# Patient Record
Sex: Female | Born: 1935 | Race: White | Hispanic: No | Smoking: Never smoker
Health system: Southern US, Community
[De-identification: ages and names within clinical notes are randomized; demographics above are authoritative.]

## PROBLEM LIST (undated history)

## (undated) DIAGNOSIS — C801 Malignant (primary) neoplasm, unspecified: Secondary | ICD-10-CM

## (undated) DIAGNOSIS — N2889 Other specified disorders of kidney and ureter: Secondary | ICD-10-CM

## (undated) DIAGNOSIS — C49A0 Gastrointestinal stromal tumor, unspecified site: Secondary | ICD-10-CM

## (undated) DIAGNOSIS — G309 Alzheimer's disease, unspecified: Secondary | ICD-10-CM

## (undated) DIAGNOSIS — Z960 Presence of urogenital implants: Secondary | ICD-10-CM

## (undated) DIAGNOSIS — C50919 Malignant neoplasm of unspecified site of unspecified female breast: Secondary | ICD-10-CM

## (undated) DIAGNOSIS — F0280 Dementia in other diseases classified elsewhere without behavioral disturbance: Secondary | ICD-10-CM

## (undated) DIAGNOSIS — Z993 Dependence on wheelchair: Secondary | ICD-10-CM

## (undated) DIAGNOSIS — I10 Essential (primary) hypertension: Secondary | ICD-10-CM

## (undated) DIAGNOSIS — E7800 Pure hypercholesterolemia, unspecified: Secondary | ICD-10-CM

## (undated) DIAGNOSIS — C787 Secondary malignant neoplasm of liver and intrahepatic bile duct: Secondary | ICD-10-CM

## (undated) HISTORY — DX: Essential (primary) hypertension: I10

## (undated) HISTORY — PX: MASTECTOMY: SHX3

---

## 2000-03-03 ENCOUNTER — Emergency Department (HOSPITAL_COMMUNITY): Admission: EM | Admit: 2000-03-03 | Discharge: 2000-03-03 | Payer: Self-pay | Admitting: Emergency Medicine

## 2000-03-04 ENCOUNTER — Inpatient Hospital Stay (HOSPITAL_COMMUNITY): Admission: EM | Admit: 2000-03-04 | Discharge: 2000-03-20 | Payer: Self-pay | Admitting: Emergency Medicine

## 2000-03-05 ENCOUNTER — Encounter: Payer: Self-pay | Admitting: Internal Medicine

## 2000-03-08 ENCOUNTER — Encounter: Payer: Self-pay | Admitting: Internal Medicine

## 2000-03-09 ENCOUNTER — Encounter: Payer: Self-pay | Admitting: Internal Medicine

## 2000-03-13 ENCOUNTER — Encounter: Payer: Self-pay | Admitting: Internal Medicine

## 2000-04-13 ENCOUNTER — Encounter: Admission: RE | Admit: 2000-04-13 | Discharge: 2000-04-13 | Payer: Self-pay | Admitting: Internal Medicine

## 2000-04-14 ENCOUNTER — Ambulatory Visit (HOSPITAL_COMMUNITY): Admission: RE | Admit: 2000-04-14 | Discharge: 2000-04-14 | Payer: Self-pay | Admitting: Internal Medicine

## 2000-05-11 ENCOUNTER — Encounter: Admission: RE | Admit: 2000-05-11 | Discharge: 2000-05-11 | Payer: Self-pay | Admitting: Internal Medicine

## 2000-06-30 ENCOUNTER — Encounter: Admission: RE | Admit: 2000-06-30 | Discharge: 2000-06-30 | Payer: Self-pay | Admitting: Internal Medicine

## 2000-06-30 ENCOUNTER — Encounter: Payer: Self-pay | Admitting: Internal Medicine

## 2000-07-14 ENCOUNTER — Ambulatory Visit (HOSPITAL_COMMUNITY): Admission: RE | Admit: 2000-07-14 | Discharge: 2000-07-14 | Payer: Self-pay

## 2000-07-14 ENCOUNTER — Encounter (INDEPENDENT_AMBULATORY_CARE_PROVIDER_SITE_OTHER): Payer: Self-pay | Admitting: *Deleted

## 2000-07-28 ENCOUNTER — Encounter: Admission: RE | Admit: 2000-07-28 | Discharge: 2000-07-28 | Payer: Self-pay | Admitting: Internal Medicine

## 2000-08-25 ENCOUNTER — Encounter (INDEPENDENT_AMBULATORY_CARE_PROVIDER_SITE_OTHER): Payer: Self-pay | Admitting: Specialist

## 2000-08-25 ENCOUNTER — Observation Stay (HOSPITAL_COMMUNITY): Admission: RE | Admit: 2000-08-25 | Discharge: 2000-08-26 | Payer: Self-pay

## 2000-09-29 ENCOUNTER — Encounter: Admission: RE | Admit: 2000-09-29 | Discharge: 2000-09-29 | Payer: Self-pay | Admitting: Internal Medicine

## 2000-11-09 ENCOUNTER — Encounter: Admission: RE | Admit: 2000-11-09 | Discharge: 2000-12-31 | Payer: Self-pay | Admitting: Internal Medicine

## 2000-12-29 ENCOUNTER — Encounter: Admission: RE | Admit: 2000-12-29 | Discharge: 2000-12-29 | Payer: Self-pay | Admitting: Internal Medicine

## 2002-02-03 ENCOUNTER — Encounter: Payer: Self-pay | Admitting: General Surgery

## 2002-02-03 ENCOUNTER — Encounter: Admission: RE | Admit: 2002-02-03 | Discharge: 2002-02-03 | Payer: Self-pay | Admitting: General Surgery

## 2003-03-07 ENCOUNTER — Encounter: Admission: RE | Admit: 2003-03-07 | Discharge: 2003-06-05 | Payer: Self-pay | Admitting: Internal Medicine

## 2003-06-06 ENCOUNTER — Encounter: Admission: RE | Admit: 2003-06-06 | Discharge: 2003-07-13 | Payer: Self-pay | Admitting: Internal Medicine

## 2004-08-07 ENCOUNTER — Ambulatory Visit (HOSPITAL_COMMUNITY): Admission: RE | Admit: 2004-08-07 | Discharge: 2004-08-07 | Payer: Self-pay | Admitting: Gastroenterology

## 2007-02-17 ENCOUNTER — Encounter: Admission: RE | Admit: 2007-02-17 | Discharge: 2007-02-17 | Payer: Self-pay | Admitting: Internal Medicine

## 2009-11-08 ENCOUNTER — Encounter: Admission: RE | Admit: 2009-11-08 | Discharge: 2009-11-08 | Payer: Self-pay | Admitting: Emergency Medicine

## 2009-11-16 ENCOUNTER — Encounter (HOSPITAL_BASED_OUTPATIENT_CLINIC_OR_DEPARTMENT_OTHER): Admission: RE | Admit: 2009-11-16 | Discharge: 2010-01-08 | Payer: Self-pay | Admitting: General Surgery

## 2009-11-23 ENCOUNTER — Ambulatory Visit: Admission: RE | Admit: 2009-11-23 | Discharge: 2009-11-23 | Payer: Self-pay | Admitting: General Surgery

## 2009-11-23 ENCOUNTER — Ambulatory Visit: Payer: Self-pay | Admitting: Vascular Surgery

## 2010-02-12 ENCOUNTER — Encounter: Admission: RE | Admit: 2010-02-12 | Discharge: 2010-02-12 | Payer: Self-pay | Admitting: Emergency Medicine

## 2010-04-23 ENCOUNTER — Emergency Department (HOSPITAL_COMMUNITY): Admission: EM | Admit: 2010-04-23 | Discharge: 2010-04-24 | Payer: Self-pay | Admitting: Emergency Medicine

## 2010-08-12 ENCOUNTER — Encounter
Admission: RE | Admit: 2010-08-12 | Discharge: 2010-10-08 | Payer: Self-pay | Source: Home / Self Care | Admitting: Neurology

## 2011-03-21 NOTE — Op Note (Signed)
[facility]  Patient:    Melanie Trevino, Melanie Trevino                         MRN: [id number] Proc. Date: 08/25/00 Adm. Date:  [id number] Attending:  Gennie Alma CC:         Winn Jock. Earl Gala, M.D.   Operative Report  CC#:  [id number]  PREOPERATIVE DIAGNOSES:  Dominant nodule of right lower pole of right lobe of thyroid.  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  OPERATION PERFORMED:  Right thyroid lobectomy.  SURGEON:  Dr. Orpah Greek.  ASSISTANT:  Dr. Rozetta Nunnery.  ANESTHESIA:  General endotracheal.  DESCRIPTION OF PROCEDURE:  Under adequate general endotracheal anesthesia, the patients neck was extended on a shoulder roll and then prepared and draped in the usual fashion. A low collar incision was made. The patient had a very short neck that was significant in circumference. The incision was deepened through the platysma muscle. The superior and inferior platysmal flaps were then developed and the middle cervical fascia was exposed. The Mohorner self retaining retractor was inserted. The neurocervical fascia was then incised longitudinally in the midline. The right sternohyoid and sternothyroid strap muscles were then divided transversely using Bovie electrocoagulation. In doing this, the external jugular vein and anterior jugular vein were both divided over hemostats which were ligated with 3-0 silk.  The surgical plane of the right lobe of the thyroid was entered by blunt and sharp dissection. The lobe of the thyroid was freed up from the surrounding tissue and placed on traction anteriorly and rotated medially. The inferior pole structures were freed up. The inferior parathyroid gland was identified and spared of any injury. The vessels going to the inferior pole of the thyroid were divided over medium hemoclips and clamped with hemostats and ligated with 3-0 silk. This gave a good deal of freedom to the right lobe.  The superior pole vessels were then dissected out and divided over triple application of 2-0 silk leaving a double application on the stay side. This allowed for much more mobility of the lobe which could then be rotated further anteriorly and medially. The right recurrent laryngeal nerve was identified and spared of injury as the thyroid gland was dissected off of it. The branches of the inferior thyroid artery were divided over medium hemoclips and then the attachment to the trachea were divided with Bovie electrocoagulation. The specimen then remained attached only by its attachment to the isthmus of the thyroid. The isthmus was divided over a hemostat at the junction between the isthmus and the left lobe of the thyroid. The specimen was removed from the operative field and sent for frozen section study. The medial edge of the thyroid gland at its junction with the isthmus was repaired with continuous suture of 4-0 Vicryl for hemostasis. The frozen section analysis of the specimen was that it was a benign follicular hyperplasia and did not appear to be a follicular neoplasm.  Palpation of the left lobe of the thyroid appeared that was completely normal and so no further surgery was done. The sternohyoid muscle was repaired with interrupted sutures of 4-0 Vicryl and then reapproximated in the midline after hemostasis was ascertained. There was a little bit of oozing just next to the recurrent laryngeal nerve as it enters the trachea and this was controlled with Surgicel.  The platysma muscle was reapproximated with interrupted sutures of 4-0 Vicryl, the skin was approximated with  generic skin stapler 35-W and a sterile dressing was applied. Estimated blood loss for the procedure was less than 50 cc. The patient tolerated the procedure well and left the operating room in satisfactory condition. DD:  08/25/00 TD:  08/25/00 Job: [id number]

## 2011-03-21 NOTE — Discharge Summary (Signed)
[facility]  Patient:    Melanie Trevino, Melanie Trevino                         MRN: [id number] Adm. Date:  [id number] Disc. Date: [id number] Attending:  Darnelle Bos Dictator:   Winn Jock Earl Gala, M.D.                           Discharge Summary  ADMISSION DIAGNOSIS:  Cellulitis.  DISCHARGE DIAGNOSES: 1.  Cellulitis. 2.  Shortness of breath secondary to atelectasis. 3.  Thyroid nodule. 4.  Indeterminate liver lesion. 5.  History of spindle cell tumor. 6.  History of acoustic neuroma. 7.  Yeast infection. 8.  Hypertension.  Please see admitting history and physical examination for detail.  HOSPITAL COURSE:  The patient was admitted for fever and severe right leg cellulitis.  This involved the right lower leg.  There was a small area of excoriation on the lateral malleolus.  Her main pain, however, was over the medial malleolus.  Because of a recent arm fracture for which she was to follow-up, she was seen by Orthopedics.  She was also seen in consultation by Infectious Disease who felt this was possibly due to methicillin-resistant Staphylococcus aureus and so recommended a change from Ancef to vancomycin and doxycycline.  The patients temperature slowly trended down.  She did not really become afebrile until Mar 14, 2000.  During that time, she also had an episode of shortness of breath.  Chest x-ray revealed atelectasis.  Spiral CT scan was done and showed no abnormality consistent with a pulmonary embolus.  She continued to be treated with minocycline and vancomycin.  She had difficulty trying to ambulate because the leg would become much more edematous and painful when she tried to stand.  Therefore, she started to use Ace wraps to try to control the edema and this was very successful in allowing her to finally get up and ambulate.  She worked steadily with Physical Therapy.  She was seen in follow-up several times by Dr. Amanda Pea concerning her arm  fracture and was finally able to ambulate adequately to return home.  On Mar 16, 2000, vancomycin was actually discontinued and minocycline was continued at that point.  She developed some vulvar erythema and itching and was treated with Diflucan.  She continued to slowly improve.  She was able to ambulate down the hall with assistance and was therefore able to be discharged in an improved condition.  MEDICATIONS ON DISCHARGE: 1.  Cozaar 50 mg b.i.d. 2.  Lasix 20 mg, two daily. 3.  Minocycline 100 mg b.i.d. x 5 days. 4.  Potassium chloride 20 mEq daily. 5.  Glucosamine chondroitin daily. 6.  Pravachol 20 mg at bedtime.  FOLLOW-UP:  She will make an appointment to see me in one to two weeks and Dr. Orvan Falconer in two to three weeks.  She will follow-up with Dr. Amanda Pea as requested by him.  She will use an Ace wrap on the leg prior to ambulation. DIET:  She will eat a diet as tolerated.  ACTIVITY:  As tolerated and per Physical Therapy. DD:  04/29/00 TD:  04/30/00 Job: [id number]

## 2011-03-21 NOTE — H&P (Signed)
[facility]  Patient:    Melanie Trevino, Melanie Trevino                         MRN: [id number] Adm. Date:  [id number] Attending:  Darnelle Bos                         History and Physical  HISTORY OF PRESENT ILLNESS:  Ms. Melanie Trevino is a 77 year old white female with notable cellulitis.  In 1998, she had trouble with erythema and swelling of the right leg.  She saw Dr. Nicoletta Dress. Strand in our office at that time.  She eventually saw Dr. Yetta Barre and ended up with a biopsy.  She supposedly had a "unusual Staph" and was treated with Septra.  She chronically has had some lesions on the right ankle that have never healed entirely.  In early 2001, she had a course of Cipro for some recurring infection and cellulitis.  [place]th[place]  She fractured her left elbow and scraped her left ankle and twisted her right knee.  The knee became red and swollen.  She saw Dr. Onalee Hua III for the elbow fracture and she was given Keflex for some drainage and weeping around the right ankle as well; that improved.  On Monday,  April 30th, she had nausea, vomiting, fever and some loose stools.  We presumed it was a gastroenteritis.  Yesterday, she felt better, but yesterday afternoon, she noted that her right leg was very red and swollen.  She came in and was seen in the emergency room at [facility] and was treated with Rocephin.  She came in today for another dose but she was worse.  A Doppler exam was negative for clot but there were large hyperemic lymph nodes in the right groin.  I was called to admit her.  PAST MEDICAL HISTORY:  Hypertension.  Arthritis.  Colon polyps.  Spindle cell tumor of the gut.  Acoustic neuroma.  Colon polyps.  Incisional herniae.  SURGICAL HISTORY:  Tonsillectomy.  Total hip replacement.  Hysterectomy. Resection of spindle cell tumor.  Resection of acoustic neuroma and stereotactic radiotherapy of acoustic  neuroma.  Colonoscopy.  ALLERGIES:  HORSE SERUM.  MEDICATIONS: 1. Cozaar 50 mg b.i.d. 2. Lasix 20 mg two in the morning and one in the evening. 3. Kay-Ciel 20 mEq daily. 4. Vitamin E 1000 IU daily. 5. Glucosamine and chondroitin. 6. Vitamin C 500 mg daily.  SOCIAL HISTORY:  She does not smoke.  She is happily married.  She drinks occasionally.  FAMILY HISTORY:  Parents have passed away, father with prostate cancer, mother f lung cancer.  Mother had diabetes.  REVIEW OF SYSTEMS:  Otherwise negative.  PHYSICAL EXAMINATION:  GENERAL:  Well-developed, well-nourished, in no distress.  VITAL SIGNS:  Temperature 102.3, blood pressure 120/69, pulse 72.  HEENT:  The eyes have pupils which are round and reactive to light. Extraocular movements are intact.  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  NECK:  Supple.  Thyroid is not enlarged or tender.  CHEST:  Clear to auscultation and percussion.  CARDIAC:  Normal S1 and S2, without an S3, S4, murmur, rub or click.  ABDOMEN:  Soft and nontender with incisional herniae anteriorly.  EXTREMITIES:  Extremities reveal the left leg to be okay.  The left arm is in a  splint.  The right  leg has redness and swelling from the ankle to just below the knee circumferentially around the entire leg.  There is right groin erythema just medial and inferior to the inguinal fold.  There is a palpable node in the femoral triangle as well as in the inguinal area.  NEUROLOGIC:  Except for the cranial nerves described above, other cranial nerves are intact.  Motor is 5/5.  LABORATORY DATA:  WBC 17,000, hemoglobin 14.3, potassium 3.3, sodium 137.  IMPRESSION: 1. Severe right leg cellulitis. 2. Nausea and vomiting secondary to #1. 3. Hypokalemia. 4. Arm fracture. 5. Acoustic neuroma. 6. Spindle cell tumor.  PLAN:  Patient will be admitted, treated with IV Ancef.  I will call orthopedics in the  morning to check her arm.  I will give her IV fluids and replete her potassium. DD:  03/04/00 TD:  03/05/00 Job: [id number]

## 2011-11-03 DIAGNOSIS — C7889 Secondary malignant neoplasm of other digestive organs: Secondary | ICD-10-CM | POA: Diagnosis not present

## 2011-11-03 DIAGNOSIS — Z466 Encounter for fitting and adjustment of urinary device: Secondary | ICD-10-CM | POA: Diagnosis not present

## 2011-11-03 DIAGNOSIS — R32 Unspecified urinary incontinence: Secondary | ICD-10-CM | POA: Diagnosis not present

## 2011-11-03 DIAGNOSIS — M169 Osteoarthritis of hip, unspecified: Secondary | ICD-10-CM | POA: Diagnosis not present

## 2011-11-03 DIAGNOSIS — D333 Benign neoplasm of cranial nerves: Secondary | ICD-10-CM | POA: Diagnosis not present

## 2011-11-11 DIAGNOSIS — D333 Benign neoplasm of cranial nerves: Secondary | ICD-10-CM | POA: Diagnosis not present

## 2011-11-11 DIAGNOSIS — R32 Unspecified urinary incontinence: Secondary | ICD-10-CM | POA: Diagnosis not present

## 2011-11-11 DIAGNOSIS — C7889 Secondary malignant neoplasm of other digestive organs: Secondary | ICD-10-CM | POA: Diagnosis not present

## 2011-11-11 DIAGNOSIS — Z466 Encounter for fitting and adjustment of urinary device: Secondary | ICD-10-CM | POA: Diagnosis not present

## 2011-11-11 DIAGNOSIS — M169 Osteoarthritis of hip, unspecified: Secondary | ICD-10-CM | POA: Diagnosis not present

## 2011-11-12 DIAGNOSIS — Z792 Long term (current) use of antibiotics: Secondary | ICD-10-CM | POA: Diagnosis not present

## 2011-11-12 DIAGNOSIS — M1710 Unilateral primary osteoarthritis, unspecified knee: Secondary | ICD-10-CM | POA: Diagnosis not present

## 2011-11-12 DIAGNOSIS — C494 Malignant neoplasm of connective and soft tissue of abdomen: Secondary | ICD-10-CM | POA: Diagnosis not present

## 2011-11-12 DIAGNOSIS — IMO0002 Reserved for concepts with insufficient information to code with codable children: Secondary | ICD-10-CM | POA: Diagnosis not present

## 2011-11-12 DIAGNOSIS — M169 Osteoarthritis of hip, unspecified: Secondary | ICD-10-CM | POA: Diagnosis not present

## 2011-11-19 DIAGNOSIS — R32 Unspecified urinary incontinence: Secondary | ICD-10-CM | POA: Diagnosis not present

## 2011-11-19 DIAGNOSIS — Z466 Encounter for fitting and adjustment of urinary device: Secondary | ICD-10-CM | POA: Diagnosis not present

## 2011-11-19 DIAGNOSIS — D333 Benign neoplasm of cranial nerves: Secondary | ICD-10-CM | POA: Diagnosis not present

## 2011-11-19 DIAGNOSIS — M169 Osteoarthritis of hip, unspecified: Secondary | ICD-10-CM | POA: Diagnosis not present

## 2011-11-19 DIAGNOSIS — C7889 Secondary malignant neoplasm of other digestive organs: Secondary | ICD-10-CM | POA: Diagnosis not present

## 2011-11-20 DIAGNOSIS — R32 Unspecified urinary incontinence: Secondary | ICD-10-CM | POA: Diagnosis not present

## 2011-11-20 DIAGNOSIS — Z466 Encounter for fitting and adjustment of urinary device: Secondary | ICD-10-CM | POA: Diagnosis not present

## 2011-11-20 DIAGNOSIS — M169 Osteoarthritis of hip, unspecified: Secondary | ICD-10-CM | POA: Diagnosis not present

## 2011-11-20 DIAGNOSIS — C7889 Secondary malignant neoplasm of other digestive organs: Secondary | ICD-10-CM | POA: Diagnosis not present

## 2011-11-20 DIAGNOSIS — D333 Benign neoplasm of cranial nerves: Secondary | ICD-10-CM | POA: Diagnosis not present

## 2011-11-21 DIAGNOSIS — C7889 Secondary malignant neoplasm of other digestive organs: Secondary | ICD-10-CM | POA: Diagnosis not present

## 2011-11-21 DIAGNOSIS — Z466 Encounter for fitting and adjustment of urinary device: Secondary | ICD-10-CM | POA: Diagnosis not present

## 2011-11-21 DIAGNOSIS — M169 Osteoarthritis of hip, unspecified: Secondary | ICD-10-CM | POA: Diagnosis not present

## 2011-11-21 DIAGNOSIS — D333 Benign neoplasm of cranial nerves: Secondary | ICD-10-CM | POA: Diagnosis not present

## 2011-11-21 DIAGNOSIS — R32 Unspecified urinary incontinence: Secondary | ICD-10-CM | POA: Diagnosis not present

## 2011-11-22 DIAGNOSIS — Z466 Encounter for fitting and adjustment of urinary device: Secondary | ICD-10-CM | POA: Diagnosis not present

## 2011-11-22 DIAGNOSIS — D333 Benign neoplasm of cranial nerves: Secondary | ICD-10-CM | POA: Diagnosis not present

## 2011-11-22 DIAGNOSIS — C7889 Secondary malignant neoplasm of other digestive organs: Secondary | ICD-10-CM | POA: Diagnosis not present

## 2011-11-22 DIAGNOSIS — M169 Osteoarthritis of hip, unspecified: Secondary | ICD-10-CM | POA: Diagnosis not present

## 2011-11-22 DIAGNOSIS — R32 Unspecified urinary incontinence: Secondary | ICD-10-CM | POA: Diagnosis not present

## 2011-11-23 DIAGNOSIS — Z466 Encounter for fitting and adjustment of urinary device: Secondary | ICD-10-CM | POA: Diagnosis not present

## 2011-11-23 DIAGNOSIS — R32 Unspecified urinary incontinence: Secondary | ICD-10-CM | POA: Diagnosis not present

## 2011-11-23 DIAGNOSIS — M169 Osteoarthritis of hip, unspecified: Secondary | ICD-10-CM | POA: Diagnosis not present

## 2011-11-23 DIAGNOSIS — C7889 Secondary malignant neoplasm of other digestive organs: Secondary | ICD-10-CM | POA: Diagnosis not present

## 2011-11-23 DIAGNOSIS — D333 Benign neoplasm of cranial nerves: Secondary | ICD-10-CM | POA: Diagnosis not present

## 2011-11-24 DIAGNOSIS — Z466 Encounter for fitting and adjustment of urinary device: Secondary | ICD-10-CM | POA: Diagnosis not present

## 2011-11-24 DIAGNOSIS — C7889 Secondary malignant neoplasm of other digestive organs: Secondary | ICD-10-CM | POA: Diagnosis not present

## 2011-11-24 DIAGNOSIS — D333 Benign neoplasm of cranial nerves: Secondary | ICD-10-CM | POA: Diagnosis not present

## 2011-11-24 DIAGNOSIS — M169 Osteoarthritis of hip, unspecified: Secondary | ICD-10-CM | POA: Diagnosis not present

## 2011-11-24 DIAGNOSIS — R32 Unspecified urinary incontinence: Secondary | ICD-10-CM | POA: Diagnosis not present

## 2011-11-25 DIAGNOSIS — R32 Unspecified urinary incontinence: Secondary | ICD-10-CM | POA: Diagnosis not present

## 2011-11-25 DIAGNOSIS — Z466 Encounter for fitting and adjustment of urinary device: Secondary | ICD-10-CM | POA: Diagnosis not present

## 2011-11-25 DIAGNOSIS — M169 Osteoarthritis of hip, unspecified: Secondary | ICD-10-CM | POA: Diagnosis not present

## 2011-11-25 DIAGNOSIS — C7889 Secondary malignant neoplasm of other digestive organs: Secondary | ICD-10-CM | POA: Diagnosis not present

## 2011-11-25 DIAGNOSIS — D333 Benign neoplasm of cranial nerves: Secondary | ICD-10-CM | POA: Diagnosis not present

## 2011-12-03 DIAGNOSIS — D333 Benign neoplasm of cranial nerves: Secondary | ICD-10-CM | POA: Diagnosis not present

## 2011-12-03 DIAGNOSIS — Z466 Encounter for fitting and adjustment of urinary device: Secondary | ICD-10-CM | POA: Diagnosis not present

## 2011-12-03 DIAGNOSIS — C7889 Secondary malignant neoplasm of other digestive organs: Secondary | ICD-10-CM | POA: Diagnosis not present

## 2011-12-03 DIAGNOSIS — R32 Unspecified urinary incontinence: Secondary | ICD-10-CM | POA: Diagnosis not present

## 2011-12-03 DIAGNOSIS — M169 Osteoarthritis of hip, unspecified: Secondary | ICD-10-CM | POA: Diagnosis not present

## 2011-12-11 DIAGNOSIS — C7889 Secondary malignant neoplasm of other digestive organs: Secondary | ICD-10-CM | POA: Diagnosis not present

## 2011-12-11 DIAGNOSIS — Z466 Encounter for fitting and adjustment of urinary device: Secondary | ICD-10-CM | POA: Diagnosis not present

## 2011-12-11 DIAGNOSIS — D333 Benign neoplasm of cranial nerves: Secondary | ICD-10-CM | POA: Diagnosis not present

## 2011-12-11 DIAGNOSIS — R32 Unspecified urinary incontinence: Secondary | ICD-10-CM | POA: Diagnosis not present

## 2011-12-11 DIAGNOSIS — M169 Osteoarthritis of hip, unspecified: Secondary | ICD-10-CM | POA: Diagnosis not present

## 2011-12-19 DIAGNOSIS — D333 Benign neoplasm of cranial nerves: Secondary | ICD-10-CM | POA: Diagnosis not present

## 2011-12-19 DIAGNOSIS — Z466 Encounter for fitting and adjustment of urinary device: Secondary | ICD-10-CM | POA: Diagnosis not present

## 2011-12-19 DIAGNOSIS — C7889 Secondary malignant neoplasm of other digestive organs: Secondary | ICD-10-CM | POA: Diagnosis not present

## 2011-12-19 DIAGNOSIS — R32 Unspecified urinary incontinence: Secondary | ICD-10-CM | POA: Diagnosis not present

## 2011-12-19 DIAGNOSIS — M169 Osteoarthritis of hip, unspecified: Secondary | ICD-10-CM | POA: Diagnosis not present

## 2011-12-29 DIAGNOSIS — I10 Essential (primary) hypertension: Secondary | ICD-10-CM | POA: Diagnosis not present

## 2012-01-02 DIAGNOSIS — I10 Essential (primary) hypertension: Secondary | ICD-10-CM | POA: Diagnosis not present

## 2012-01-02 DIAGNOSIS — R32 Unspecified urinary incontinence: Secondary | ICD-10-CM | POA: Diagnosis not present

## 2012-01-02 DIAGNOSIS — C7889 Secondary malignant neoplasm of other digestive organs: Secondary | ICD-10-CM | POA: Diagnosis not present

## 2012-01-02 DIAGNOSIS — D333 Benign neoplasm of cranial nerves: Secondary | ICD-10-CM | POA: Diagnosis not present

## 2012-01-02 DIAGNOSIS — Z466 Encounter for fitting and adjustment of urinary device: Secondary | ICD-10-CM | POA: Diagnosis not present

## 2012-01-06 DIAGNOSIS — Z853 Personal history of malignant neoplasm of breast: Secondary | ICD-10-CM | POA: Diagnosis not present

## 2012-01-06 DIAGNOSIS — C499 Malignant neoplasm of connective and soft tissue, unspecified: Secondary | ICD-10-CM | POA: Diagnosis not present

## 2012-01-06 DIAGNOSIS — I10 Essential (primary) hypertension: Secondary | ICD-10-CM | POA: Diagnosis not present

## 2012-01-06 DIAGNOSIS — R32 Unspecified urinary incontinence: Secondary | ICD-10-CM | POA: Diagnosis not present

## 2012-01-06 DIAGNOSIS — C50919 Malignant neoplasm of unspecified site of unspecified female breast: Secondary | ICD-10-CM | POA: Diagnosis not present

## 2012-01-06 DIAGNOSIS — C787 Secondary malignant neoplasm of liver and intrahepatic bile duct: Secondary | ICD-10-CM | POA: Diagnosis not present

## 2012-01-06 DIAGNOSIS — C7889 Secondary malignant neoplasm of other digestive organs: Secondary | ICD-10-CM | POA: Diagnosis not present

## 2012-01-06 DIAGNOSIS — D333 Benign neoplasm of cranial nerves: Secondary | ICD-10-CM | POA: Diagnosis not present

## 2012-01-06 DIAGNOSIS — Z466 Encounter for fitting and adjustment of urinary device: Secondary | ICD-10-CM | POA: Diagnosis not present

## 2012-01-14 DIAGNOSIS — C7889 Secondary malignant neoplasm of other digestive organs: Secondary | ICD-10-CM | POA: Diagnosis not present

## 2012-01-14 DIAGNOSIS — Z466 Encounter for fitting and adjustment of urinary device: Secondary | ICD-10-CM | POA: Diagnosis not present

## 2012-01-14 DIAGNOSIS — R32 Unspecified urinary incontinence: Secondary | ICD-10-CM | POA: Diagnosis not present

## 2012-01-14 DIAGNOSIS — I10 Essential (primary) hypertension: Secondary | ICD-10-CM | POA: Diagnosis not present

## 2012-01-14 DIAGNOSIS — D333 Benign neoplasm of cranial nerves: Secondary | ICD-10-CM | POA: Diagnosis not present

## 2012-01-21 DIAGNOSIS — L821 Other seborrheic keratosis: Secondary | ICD-10-CM | POA: Diagnosis not present

## 2012-01-21 DIAGNOSIS — Z85828 Personal history of other malignant neoplasm of skin: Secondary | ICD-10-CM | POA: Diagnosis not present

## 2012-01-21 DIAGNOSIS — I8310 Varicose veins of unspecified lower extremity with inflammation: Secondary | ICD-10-CM | POA: Diagnosis not present

## 2012-01-21 DIAGNOSIS — L738 Other specified follicular disorders: Secondary | ICD-10-CM | POA: Diagnosis not present

## 2012-02-05 DIAGNOSIS — N390 Urinary tract infection, site not specified: Secondary | ICD-10-CM | POA: Diagnosis not present

## 2012-02-05 DIAGNOSIS — Z466 Encounter for fitting and adjustment of urinary device: Secondary | ICD-10-CM | POA: Diagnosis not present

## 2012-02-05 DIAGNOSIS — I10 Essential (primary) hypertension: Secondary | ICD-10-CM | POA: Diagnosis not present

## 2012-02-05 DIAGNOSIS — C7889 Secondary malignant neoplasm of other digestive organs: Secondary | ICD-10-CM | POA: Diagnosis not present

## 2012-02-05 DIAGNOSIS — R32 Unspecified urinary incontinence: Secondary | ICD-10-CM | POA: Diagnosis not present

## 2012-02-05 DIAGNOSIS — D333 Benign neoplasm of cranial nerves: Secondary | ICD-10-CM | POA: Diagnosis not present

## 2012-03-02 DIAGNOSIS — Z466 Encounter for fitting and adjustment of urinary device: Secondary | ICD-10-CM | POA: Diagnosis not present

## 2012-03-02 DIAGNOSIS — C7889 Secondary malignant neoplasm of other digestive organs: Secondary | ICD-10-CM | POA: Diagnosis not present

## 2012-03-02 DIAGNOSIS — M1610 Unilateral primary osteoarthritis, unspecified hip: Secondary | ICD-10-CM | POA: Diagnosis not present

## 2012-03-02 DIAGNOSIS — R32 Unspecified urinary incontinence: Secondary | ICD-10-CM | POA: Diagnosis not present

## 2012-03-02 DIAGNOSIS — I10 Essential (primary) hypertension: Secondary | ICD-10-CM | POA: Diagnosis not present

## 2012-03-23 DIAGNOSIS — M169 Osteoarthritis of hip, unspecified: Secondary | ICD-10-CM | POA: Diagnosis not present

## 2012-04-06 DIAGNOSIS — C499 Malignant neoplasm of connective and soft tissue, unspecified: Secondary | ICD-10-CM | POA: Diagnosis not present

## 2012-04-06 DIAGNOSIS — K439 Ventral hernia without obstruction or gangrene: Secondary | ICD-10-CM | POA: Diagnosis not present

## 2012-04-06 DIAGNOSIS — K7689 Other specified diseases of liver: Secondary | ICD-10-CM | POA: Diagnosis not present

## 2012-04-09 DIAGNOSIS — M169 Osteoarthritis of hip, unspecified: Secondary | ICD-10-CM | POA: Diagnosis not present

## 2012-04-23 DIAGNOSIS — M169 Osteoarthritis of hip, unspecified: Secondary | ICD-10-CM | POA: Diagnosis not present

## 2012-04-27 ENCOUNTER — Ambulatory Visit: Payer: Medicare Other | Attending: Physical Medicine and Rehabilitation

## 2012-04-27 DIAGNOSIS — IMO0001 Reserved for inherently not codable concepts without codable children: Secondary | ICD-10-CM | POA: Diagnosis not present

## 2012-04-27 DIAGNOSIS — M25559 Pain in unspecified hip: Secondary | ICD-10-CM | POA: Insufficient documentation

## 2012-04-27 DIAGNOSIS — R5381 Other malaise: Secondary | ICD-10-CM | POA: Insufficient documentation

## 2012-04-27 DIAGNOSIS — R262 Difficulty in walking, not elsewhere classified: Secondary | ICD-10-CM | POA: Insufficient documentation

## 2012-05-01 DIAGNOSIS — Z466 Encounter for fitting and adjustment of urinary device: Secondary | ICD-10-CM | POA: Diagnosis not present

## 2012-05-01 DIAGNOSIS — R32 Unspecified urinary incontinence: Secondary | ICD-10-CM | POA: Diagnosis not present

## 2012-05-01 DIAGNOSIS — I10 Essential (primary) hypertension: Secondary | ICD-10-CM | POA: Diagnosis not present

## 2012-05-01 DIAGNOSIS — M1610 Unilateral primary osteoarthritis, unspecified hip: Secondary | ICD-10-CM | POA: Diagnosis not present

## 2012-05-01 DIAGNOSIS — C7889 Secondary malignant neoplasm of other digestive organs: Secondary | ICD-10-CM | POA: Diagnosis not present

## 2012-05-03 ENCOUNTER — Ambulatory Visit: Payer: Medicare Other | Attending: Physical Medicine and Rehabilitation | Admitting: Physical Therapy

## 2012-05-03 DIAGNOSIS — R5381 Other malaise: Secondary | ICD-10-CM | POA: Diagnosis not present

## 2012-05-03 DIAGNOSIS — IMO0001 Reserved for inherently not codable concepts without codable children: Secondary | ICD-10-CM | POA: Diagnosis not present

## 2012-05-03 DIAGNOSIS — R262 Difficulty in walking, not elsewhere classified: Secondary | ICD-10-CM | POA: Insufficient documentation

## 2012-05-03 DIAGNOSIS — M25559 Pain in unspecified hip: Secondary | ICD-10-CM | POA: Diagnosis not present

## 2012-05-05 ENCOUNTER — Encounter: Payer: Self-pay | Admitting: Physical Therapy

## 2012-05-11 ENCOUNTER — Ambulatory Visit: Payer: Medicare Other

## 2012-05-13 ENCOUNTER — Ambulatory Visit: Payer: Medicare Other | Admitting: Rehabilitative and Restorative Service Providers"

## 2012-05-18 ENCOUNTER — Ambulatory Visit: Payer: Medicare Other

## 2012-05-19 DIAGNOSIS — H04129 Dry eye syndrome of unspecified lacrimal gland: Secondary | ICD-10-CM | POA: Diagnosis not present

## 2012-05-19 DIAGNOSIS — H16109 Unspecified superficial keratitis, unspecified eye: Secondary | ICD-10-CM | POA: Diagnosis not present

## 2012-05-19 DIAGNOSIS — Z961 Presence of intraocular lens: Secondary | ICD-10-CM | POA: Diagnosis not present

## 2012-05-19 DIAGNOSIS — H16219 Exposure keratoconjunctivitis, unspecified eye: Secondary | ICD-10-CM | POA: Diagnosis not present

## 2012-05-20 ENCOUNTER — Ambulatory Visit: Payer: Medicare Other

## 2012-05-25 ENCOUNTER — Ambulatory Visit: Payer: Medicare Other | Admitting: Physical Therapy

## 2012-05-27 ENCOUNTER — Ambulatory Visit: Payer: Medicare Other | Admitting: Physical Therapy

## 2012-05-31 DIAGNOSIS — M169 Osteoarthritis of hip, unspecified: Secondary | ICD-10-CM | POA: Diagnosis not present

## 2012-05-31 DIAGNOSIS — L97929 Non-pressure chronic ulcer of unspecified part of left lower leg with unspecified severity: Secondary | ICD-10-CM | POA: Diagnosis not present

## 2012-05-31 DIAGNOSIS — L97919 Non-pressure chronic ulcer of unspecified part of right lower leg with unspecified severity: Secondary | ICD-10-CM | POA: Diagnosis not present

## 2012-05-31 DIAGNOSIS — M1710 Unilateral primary osteoarthritis, unspecified knee: Secondary | ICD-10-CM | POA: Diagnosis not present

## 2012-06-08 ENCOUNTER — Ambulatory Visit: Payer: Medicare Other | Attending: Physical Medicine and Rehabilitation | Admitting: Physical Therapy

## 2012-06-08 DIAGNOSIS — R262 Difficulty in walking, not elsewhere classified: Secondary | ICD-10-CM | POA: Diagnosis not present

## 2012-06-08 DIAGNOSIS — R5381 Other malaise: Secondary | ICD-10-CM | POA: Insufficient documentation

## 2012-06-08 DIAGNOSIS — M25559 Pain in unspecified hip: Secondary | ICD-10-CM | POA: Insufficient documentation

## 2012-06-08 DIAGNOSIS — IMO0001 Reserved for inherently not codable concepts without codable children: Secondary | ICD-10-CM | POA: Insufficient documentation

## 2012-06-10 ENCOUNTER — Ambulatory Visit: Payer: Medicare Other | Admitting: Physical Therapy

## 2012-06-10 DIAGNOSIS — R262 Difficulty in walking, not elsewhere classified: Secondary | ICD-10-CM | POA: Diagnosis not present

## 2012-06-10 DIAGNOSIS — M25559 Pain in unspecified hip: Secondary | ICD-10-CM | POA: Diagnosis not present

## 2012-06-10 DIAGNOSIS — IMO0001 Reserved for inherently not codable concepts without codable children: Secondary | ICD-10-CM | POA: Diagnosis not present

## 2012-06-10 DIAGNOSIS — R5381 Other malaise: Secondary | ICD-10-CM | POA: Diagnosis not present

## 2012-06-11 ENCOUNTER — Encounter: Payer: Medicare Other | Admitting: Rehabilitation

## 2012-06-15 ENCOUNTER — Ambulatory Visit: Payer: Medicare Other | Admitting: Physical Therapy

## 2012-06-15 DIAGNOSIS — M25559 Pain in unspecified hip: Secondary | ICD-10-CM | POA: Diagnosis not present

## 2012-06-15 DIAGNOSIS — R5381 Other malaise: Secondary | ICD-10-CM | POA: Diagnosis not present

## 2012-06-15 DIAGNOSIS — R262 Difficulty in walking, not elsewhere classified: Secondary | ICD-10-CM | POA: Diagnosis not present

## 2012-06-15 DIAGNOSIS — IMO0001 Reserved for inherently not codable concepts without codable children: Secondary | ICD-10-CM | POA: Diagnosis not present

## 2012-06-17 ENCOUNTER — Ambulatory Visit: Payer: Medicare Other | Admitting: Physical Therapy

## 2012-06-17 DIAGNOSIS — IMO0001 Reserved for inherently not codable concepts without codable children: Secondary | ICD-10-CM | POA: Diagnosis not present

## 2012-06-17 DIAGNOSIS — R5381 Other malaise: Secondary | ICD-10-CM | POA: Diagnosis not present

## 2012-06-17 DIAGNOSIS — R262 Difficulty in walking, not elsewhere classified: Secondary | ICD-10-CM | POA: Diagnosis not present

## 2012-06-17 DIAGNOSIS — M25559 Pain in unspecified hip: Secondary | ICD-10-CM | POA: Diagnosis not present

## 2012-06-18 DIAGNOSIS — M25559 Pain in unspecified hip: Secondary | ICD-10-CM | POA: Diagnosis not present

## 2012-06-18 DIAGNOSIS — M169 Osteoarthritis of hip, unspecified: Secondary | ICD-10-CM | POA: Diagnosis not present

## 2012-06-24 ENCOUNTER — Ambulatory Visit: Payer: Medicare Other | Admitting: Physical Therapy

## 2012-06-24 DIAGNOSIS — IMO0001 Reserved for inherently not codable concepts without codable children: Secondary | ICD-10-CM | POA: Diagnosis not present

## 2012-06-24 DIAGNOSIS — M25559 Pain in unspecified hip: Secondary | ICD-10-CM | POA: Diagnosis not present

## 2012-06-24 DIAGNOSIS — R5381 Other malaise: Secondary | ICD-10-CM | POA: Diagnosis not present

## 2012-06-24 DIAGNOSIS — R262 Difficulty in walking, not elsewhere classified: Secondary | ICD-10-CM | POA: Diagnosis not present

## 2012-06-29 ENCOUNTER — Ambulatory Visit: Payer: Medicare Other | Admitting: Physical Therapy

## 2012-06-29 DIAGNOSIS — R262 Difficulty in walking, not elsewhere classified: Secondary | ICD-10-CM | POA: Diagnosis not present

## 2012-06-29 DIAGNOSIS — R5381 Other malaise: Secondary | ICD-10-CM | POA: Diagnosis not present

## 2012-06-29 DIAGNOSIS — M25559 Pain in unspecified hip: Secondary | ICD-10-CM | POA: Diagnosis not present

## 2012-06-29 DIAGNOSIS — IMO0001 Reserved for inherently not codable concepts without codable children: Secondary | ICD-10-CM | POA: Diagnosis not present

## 2012-06-30 DIAGNOSIS — M1710 Unilateral primary osteoarthritis, unspecified knee: Secondary | ICD-10-CM | POA: Diagnosis not present

## 2012-06-30 DIAGNOSIS — C7889 Secondary malignant neoplasm of other digestive organs: Secondary | ICD-10-CM | POA: Diagnosis not present

## 2012-06-30 DIAGNOSIS — M169 Osteoarthritis of hip, unspecified: Secondary | ICD-10-CM | POA: Diagnosis not present

## 2012-06-30 DIAGNOSIS — R32 Unspecified urinary incontinence: Secondary | ICD-10-CM | POA: Diagnosis not present

## 2012-06-30 DIAGNOSIS — I10 Essential (primary) hypertension: Secondary | ICD-10-CM | POA: Diagnosis not present

## 2012-06-30 DIAGNOSIS — Z466 Encounter for fitting and adjustment of urinary device: Secondary | ICD-10-CM | POA: Diagnosis not present

## 2012-07-02 ENCOUNTER — Ambulatory Visit: Payer: Medicare Other

## 2012-07-02 DIAGNOSIS — IMO0001 Reserved for inherently not codable concepts without codable children: Secondary | ICD-10-CM | POA: Diagnosis not present

## 2012-07-02 DIAGNOSIS — R5381 Other malaise: Secondary | ICD-10-CM | POA: Diagnosis not present

## 2012-07-02 DIAGNOSIS — R262 Difficulty in walking, not elsewhere classified: Secondary | ICD-10-CM | POA: Diagnosis not present

## 2012-07-02 DIAGNOSIS — M25559 Pain in unspecified hip: Secondary | ICD-10-CM | POA: Diagnosis not present

## 2012-07-06 ENCOUNTER — Encounter: Payer: Medicare Other | Admitting: Physical Therapy

## 2012-07-06 DIAGNOSIS — Z96659 Presence of unspecified artificial knee joint: Secondary | ICD-10-CM | POA: Diagnosis not present

## 2012-07-06 DIAGNOSIS — I2510 Atherosclerotic heart disease of native coronary artery without angina pectoris: Secondary | ICD-10-CM | POA: Diagnosis not present

## 2012-07-06 DIAGNOSIS — R1909 Other intra-abdominal and pelvic swelling, mass and lump: Secondary | ICD-10-CM | POA: Diagnosis not present

## 2012-07-06 DIAGNOSIS — K439 Ventral hernia without obstruction or gangrene: Secondary | ICD-10-CM | POA: Diagnosis not present

## 2012-07-06 DIAGNOSIS — M899 Disorder of bone, unspecified: Secondary | ICD-10-CM | POA: Diagnosis not present

## 2012-07-06 DIAGNOSIS — Z79899 Other long term (current) drug therapy: Secondary | ICD-10-CM | POA: Diagnosis not present

## 2012-07-06 DIAGNOSIS — M47817 Spondylosis without myelopathy or radiculopathy, lumbosacral region: Secondary | ICD-10-CM | POA: Diagnosis not present

## 2012-07-06 DIAGNOSIS — I700 Atherosclerosis of aorta: Secondary | ICD-10-CM | POA: Diagnosis not present

## 2012-07-06 DIAGNOSIS — I517 Cardiomegaly: Secondary | ICD-10-CM | POA: Diagnosis not present

## 2012-07-06 DIAGNOSIS — C499 Malignant neoplasm of connective and soft tissue, unspecified: Secondary | ICD-10-CM | POA: Diagnosis not present

## 2012-07-06 DIAGNOSIS — Z9010 Acquired absence of unspecified breast and nipple: Secondary | ICD-10-CM | POA: Diagnosis not present

## 2012-07-06 DIAGNOSIS — D481 Neoplasm of uncertain behavior of connective and other soft tissue: Secondary | ICD-10-CM | POA: Diagnosis not present

## 2012-07-06 DIAGNOSIS — I708 Atherosclerosis of other arteries: Secondary | ICD-10-CM | POA: Diagnosis not present

## 2012-07-06 DIAGNOSIS — K6389 Other specified diseases of intestine: Secondary | ICD-10-CM | POA: Diagnosis not present

## 2012-07-06 DIAGNOSIS — K1370 Unspecified lesions of oral mucosa: Secondary | ICD-10-CM | POA: Diagnosis not present

## 2012-07-06 DIAGNOSIS — Z90710 Acquired absence of both cervix and uterus: Secondary | ICD-10-CM | POA: Diagnosis not present

## 2012-07-06 DIAGNOSIS — M169 Osteoarthritis of hip, unspecified: Secondary | ICD-10-CM | POA: Diagnosis not present

## 2012-07-06 DIAGNOSIS — C50919 Malignant neoplasm of unspecified site of unspecified female breast: Secondary | ICD-10-CM | POA: Diagnosis not present

## 2012-07-06 DIAGNOSIS — K7689 Other specified diseases of liver: Secondary | ICD-10-CM | POA: Diagnosis not present

## 2012-07-07 ENCOUNTER — Ambulatory Visit: Payer: Medicare Other | Attending: Physical Medicine and Rehabilitation | Admitting: Physical Therapy

## 2012-07-07 DIAGNOSIS — R5381 Other malaise: Secondary | ICD-10-CM | POA: Insufficient documentation

## 2012-07-07 DIAGNOSIS — M169 Osteoarthritis of hip, unspecified: Secondary | ICD-10-CM | POA: Diagnosis not present

## 2012-07-07 DIAGNOSIS — C7889 Secondary malignant neoplasm of other digestive organs: Secondary | ICD-10-CM | POA: Diagnosis not present

## 2012-07-07 DIAGNOSIS — R262 Difficulty in walking, not elsewhere classified: Secondary | ICD-10-CM | POA: Insufficient documentation

## 2012-07-07 DIAGNOSIS — I10 Essential (primary) hypertension: Secondary | ICD-10-CM | POA: Diagnosis not present

## 2012-07-07 DIAGNOSIS — M25559 Pain in unspecified hip: Secondary | ICD-10-CM | POA: Insufficient documentation

## 2012-07-07 DIAGNOSIS — R32 Unspecified urinary incontinence: Secondary | ICD-10-CM | POA: Diagnosis not present

## 2012-07-07 DIAGNOSIS — Z466 Encounter for fitting and adjustment of urinary device: Secondary | ICD-10-CM | POA: Diagnosis not present

## 2012-07-07 DIAGNOSIS — IMO0001 Reserved for inherently not codable concepts without codable children: Secondary | ICD-10-CM | POA: Diagnosis not present

## 2012-07-08 ENCOUNTER — Ambulatory Visit: Payer: Medicare Other | Admitting: Physical Therapy

## 2012-07-08 DIAGNOSIS — R32 Unspecified urinary incontinence: Secondary | ICD-10-CM | POA: Diagnosis not present

## 2012-07-08 DIAGNOSIS — I10 Essential (primary) hypertension: Secondary | ICD-10-CM | POA: Diagnosis not present

## 2012-07-08 DIAGNOSIS — R5381 Other malaise: Secondary | ICD-10-CM | POA: Diagnosis not present

## 2012-07-08 DIAGNOSIS — Z466 Encounter for fitting and adjustment of urinary device: Secondary | ICD-10-CM | POA: Diagnosis not present

## 2012-07-08 DIAGNOSIS — R262 Difficulty in walking, not elsewhere classified: Secondary | ICD-10-CM | POA: Diagnosis not present

## 2012-07-08 DIAGNOSIS — IMO0001 Reserved for inherently not codable concepts without codable children: Secondary | ICD-10-CM | POA: Diagnosis not present

## 2012-07-08 DIAGNOSIS — M169 Osteoarthritis of hip, unspecified: Secondary | ICD-10-CM | POA: Diagnosis not present

## 2012-07-08 DIAGNOSIS — M25559 Pain in unspecified hip: Secondary | ICD-10-CM | POA: Diagnosis not present

## 2012-07-08 DIAGNOSIS — C7889 Secondary malignant neoplasm of other digestive organs: Secondary | ICD-10-CM | POA: Diagnosis not present

## 2012-07-12 ENCOUNTER — Ambulatory Visit: Payer: Medicare Other | Admitting: Physical Therapy

## 2012-07-12 DIAGNOSIS — R5381 Other malaise: Secondary | ICD-10-CM | POA: Diagnosis not present

## 2012-07-12 DIAGNOSIS — IMO0001 Reserved for inherently not codable concepts without codable children: Secondary | ICD-10-CM | POA: Diagnosis not present

## 2012-07-12 DIAGNOSIS — R262 Difficulty in walking, not elsewhere classified: Secondary | ICD-10-CM | POA: Diagnosis not present

## 2012-07-12 DIAGNOSIS — M25559 Pain in unspecified hip: Secondary | ICD-10-CM | POA: Diagnosis not present

## 2012-07-14 ENCOUNTER — Ambulatory Visit: Payer: Medicare Other | Admitting: Physical Therapy

## 2012-07-14 DIAGNOSIS — M169 Osteoarthritis of hip, unspecified: Secondary | ICD-10-CM | POA: Diagnosis not present

## 2012-07-14 DIAGNOSIS — R5381 Other malaise: Secondary | ICD-10-CM | POA: Diagnosis not present

## 2012-07-14 DIAGNOSIS — R32 Unspecified urinary incontinence: Secondary | ICD-10-CM | POA: Diagnosis not present

## 2012-07-14 DIAGNOSIS — IMO0001 Reserved for inherently not codable concepts without codable children: Secondary | ICD-10-CM | POA: Diagnosis not present

## 2012-07-14 DIAGNOSIS — I10 Essential (primary) hypertension: Secondary | ICD-10-CM | POA: Diagnosis not present

## 2012-07-14 DIAGNOSIS — R262 Difficulty in walking, not elsewhere classified: Secondary | ICD-10-CM | POA: Diagnosis not present

## 2012-07-14 DIAGNOSIS — C7889 Secondary malignant neoplasm of other digestive organs: Secondary | ICD-10-CM | POA: Diagnosis not present

## 2012-07-14 DIAGNOSIS — Z466 Encounter for fitting and adjustment of urinary device: Secondary | ICD-10-CM | POA: Diagnosis not present

## 2012-07-14 DIAGNOSIS — M25559 Pain in unspecified hip: Secondary | ICD-10-CM | POA: Diagnosis not present

## 2012-07-19 ENCOUNTER — Ambulatory Visit: Payer: Medicare Other

## 2012-07-19 DIAGNOSIS — R262 Difficulty in walking, not elsewhere classified: Secondary | ICD-10-CM | POA: Diagnosis not present

## 2012-07-19 DIAGNOSIS — IMO0001 Reserved for inherently not codable concepts without codable children: Secondary | ICD-10-CM | POA: Diagnosis not present

## 2012-07-19 DIAGNOSIS — M25559 Pain in unspecified hip: Secondary | ICD-10-CM | POA: Diagnosis not present

## 2012-07-19 DIAGNOSIS — R5381 Other malaise: Secondary | ICD-10-CM | POA: Diagnosis not present

## 2012-07-21 ENCOUNTER — Ambulatory Visit: Payer: Medicare Other | Admitting: Physical Therapy

## 2012-07-21 DIAGNOSIS — R5381 Other malaise: Secondary | ICD-10-CM | POA: Diagnosis not present

## 2012-07-21 DIAGNOSIS — R262 Difficulty in walking, not elsewhere classified: Secondary | ICD-10-CM | POA: Diagnosis not present

## 2012-07-21 DIAGNOSIS — M25559 Pain in unspecified hip: Secondary | ICD-10-CM | POA: Diagnosis not present

## 2012-07-21 DIAGNOSIS — IMO0001 Reserved for inherently not codable concepts without codable children: Secondary | ICD-10-CM | POA: Diagnosis not present

## 2012-07-26 ENCOUNTER — Ambulatory Visit: Payer: Medicare Other | Admitting: Physical Therapy

## 2012-07-26 DIAGNOSIS — M25559 Pain in unspecified hip: Secondary | ICD-10-CM | POA: Diagnosis not present

## 2012-07-26 DIAGNOSIS — IMO0001 Reserved for inherently not codable concepts without codable children: Secondary | ICD-10-CM | POA: Diagnosis not present

## 2012-07-26 DIAGNOSIS — R5381 Other malaise: Secondary | ICD-10-CM | POA: Diagnosis not present

## 2012-07-26 DIAGNOSIS — R262 Difficulty in walking, not elsewhere classified: Secondary | ICD-10-CM | POA: Diagnosis not present

## 2012-07-27 DIAGNOSIS — H908 Mixed conductive and sensorineural hearing loss, unspecified: Secondary | ICD-10-CM | POA: Diagnosis not present

## 2012-07-27 DIAGNOSIS — H6500 Acute serous otitis media, unspecified ear: Secondary | ICD-10-CM | POA: Diagnosis not present

## 2012-07-27 DIAGNOSIS — H902 Conductive hearing loss, unspecified: Secondary | ICD-10-CM | POA: Diagnosis not present

## 2012-07-27 DIAGNOSIS — H903 Sensorineural hearing loss, bilateral: Secondary | ICD-10-CM | POA: Diagnosis not present

## 2012-07-29 ENCOUNTER — Ambulatory Visit: Payer: Medicare Other | Admitting: Physical Therapy

## 2012-07-29 DIAGNOSIS — R262 Difficulty in walking, not elsewhere classified: Secondary | ICD-10-CM | POA: Diagnosis not present

## 2012-07-29 DIAGNOSIS — M25559 Pain in unspecified hip: Secondary | ICD-10-CM | POA: Diagnosis not present

## 2012-07-29 DIAGNOSIS — M1710 Unilateral primary osteoarthritis, unspecified knee: Secondary | ICD-10-CM | POA: Diagnosis not present

## 2012-07-29 DIAGNOSIS — IMO0001 Reserved for inherently not codable concepts without codable children: Secondary | ICD-10-CM | POA: Diagnosis not present

## 2012-07-29 DIAGNOSIS — R5381 Other malaise: Secondary | ICD-10-CM | POA: Diagnosis not present

## 2012-08-01 ENCOUNTER — Ambulatory Visit (INDEPENDENT_AMBULATORY_CARE_PROVIDER_SITE_OTHER): Payer: Medicare Other | Admitting: Emergency Medicine

## 2012-08-01 VITALS — BP 128/76 | HR 70 | Temp 98.4°F | Resp 17

## 2012-08-01 DIAGNOSIS — I878 Other specified disorders of veins: Secondary | ICD-10-CM

## 2012-08-01 DIAGNOSIS — I8310 Varicose veins of unspecified lower extremity with inflammation: Secondary | ICD-10-CM | POA: Diagnosis not present

## 2012-08-01 NOTE — Progress Notes (Signed)
[facility] [address] [place] [address] [phone]  Date:  08/01/2012   Name:  Melanie Trevino   DOB:  October 29, 1928   MRN:  [id number]  PCP:  No primary provider on file.    Chief Complaint: Leg Injury   History of Present Illness:  Melanie Trevino is a 78 y.o. very pleasant female patient who presents with the following:  Has chronic venous stasis and wears thigh high compression stockings.  Several days ago she developed a hemorrhagic bullae on her left anterior lower leg.  She has had increased collection of blood despite continuing to wear her stockings constantly.  She denies pain, fever, chills, redness, lymphangitis, or other complaint referable to her leg.    There is no problem list on file for this patient.   No past medical history on file.  No past surgical history on file.  History  Substance Use Topics  . Smoking status: Never Smoker   . Smokeless tobacco: Not on file  . Alcohol Use: Not on file    No family history on file.  No Known Allergies  Medication list has been reviewed and updated.  Current Outpatient Prescriptions on File Prior to Visit  Medication Sig Dispense Refill  . losartan (COZAAR) 50 MG tablet Take 50 mg by mouth daily.        Review of Systems:  As per HPI, otherwise negative.    Physical Examination: Filed Vitals:   08/01/12 1532  BP: 128/76  Pulse: 70  Temp: 98.4 F (36.9 C)  Resp: 17   There were no vitals filed for this visit. There is no height or weight on file to calculate BMI. Ideal Body Weight:     GEN: WDWN, NAD, Non-toxic, Alert & Oriented x 3 HEENT: Atraumatic, Normocephalic.  Ears and Nose: No external deformity. EXTR: No clubbing/cyanosis/edema NEURO: Normal gait.  PSYCH: Normally interactive. Conversant. Not depressed or anxious appearing.  Calm demeanor.  Leg:  Left leg extensive discoloration consistent with her history of venous stasis dermatitis.  Large hemorrhagic bullae  left medial anterior mid calf.  No cellulitis or lymphangitis.  Assessment and Plan: Hemorrhagic bullae.  Attempted to drain the blood by aspiration unsuccessfully Elevate leg Follow up as needed and in 5 days Watch for infection  Carmelina Dane, MD  I have reviewed and agree with documentation. Robert P. Merla Riches, M.D.

## 2012-08-02 ENCOUNTER — Ambulatory Visit: Payer: Medicare Other | Admitting: Physical Therapy

## 2012-08-02 DIAGNOSIS — R262 Difficulty in walking, not elsewhere classified: Secondary | ICD-10-CM | POA: Diagnosis not present

## 2012-08-02 DIAGNOSIS — R5381 Other malaise: Secondary | ICD-10-CM | POA: Diagnosis not present

## 2012-08-02 DIAGNOSIS — M25559 Pain in unspecified hip: Secondary | ICD-10-CM | POA: Diagnosis not present

## 2012-08-02 DIAGNOSIS — IMO0001 Reserved for inherently not codable concepts without codable children: Secondary | ICD-10-CM | POA: Diagnosis not present

## 2012-08-05 ENCOUNTER — Ambulatory Visit: Payer: Medicare Other | Attending: Physical Medicine and Rehabilitation | Admitting: Physical Therapy

## 2012-08-05 DIAGNOSIS — M25559 Pain in unspecified hip: Secondary | ICD-10-CM | POA: Insufficient documentation

## 2012-08-05 DIAGNOSIS — R262 Difficulty in walking, not elsewhere classified: Secondary | ICD-10-CM | POA: Insufficient documentation

## 2012-08-05 DIAGNOSIS — IMO0001 Reserved for inherently not codable concepts without codable children: Secondary | ICD-10-CM | POA: Insufficient documentation

## 2012-08-05 DIAGNOSIS — R5381 Other malaise: Secondary | ICD-10-CM | POA: Diagnosis not present

## 2012-08-09 ENCOUNTER — Ambulatory Visit: Payer: Medicare Other | Admitting: Physical Therapy

## 2012-08-09 DIAGNOSIS — R5381 Other malaise: Secondary | ICD-10-CM | POA: Diagnosis not present

## 2012-08-09 DIAGNOSIS — IMO0001 Reserved for inherently not codable concepts without codable children: Secondary | ICD-10-CM | POA: Diagnosis not present

## 2012-08-09 DIAGNOSIS — R262 Difficulty in walking, not elsewhere classified: Secondary | ICD-10-CM | POA: Diagnosis not present

## 2012-08-09 DIAGNOSIS — M25559 Pain in unspecified hip: Secondary | ICD-10-CM | POA: Diagnosis not present

## 2012-08-10 ENCOUNTER — Ambulatory Visit: Payer: Medicare Other

## 2012-08-10 DIAGNOSIS — IMO0001 Reserved for inherently not codable concepts without codable children: Secondary | ICD-10-CM | POA: Diagnosis not present

## 2012-08-10 DIAGNOSIS — M25559 Pain in unspecified hip: Secondary | ICD-10-CM | POA: Diagnosis not present

## 2012-08-10 DIAGNOSIS — R262 Difficulty in walking, not elsewhere classified: Secondary | ICD-10-CM | POA: Diagnosis not present

## 2012-08-10 DIAGNOSIS — R5381 Other malaise: Secondary | ICD-10-CM | POA: Diagnosis not present

## 2012-08-12 DIAGNOSIS — Z466 Encounter for fitting and adjustment of urinary device: Secondary | ICD-10-CM | POA: Diagnosis not present

## 2012-08-12 DIAGNOSIS — C7889 Secondary malignant neoplasm of other digestive organs: Secondary | ICD-10-CM | POA: Diagnosis not present

## 2012-08-12 DIAGNOSIS — M169 Osteoarthritis of hip, unspecified: Secondary | ICD-10-CM | POA: Diagnosis not present

## 2012-08-12 DIAGNOSIS — I10 Essential (primary) hypertension: Secondary | ICD-10-CM | POA: Diagnosis not present

## 2012-08-12 DIAGNOSIS — R32 Unspecified urinary incontinence: Secondary | ICD-10-CM | POA: Diagnosis not present

## 2012-08-16 ENCOUNTER — Ambulatory Visit (INDEPENDENT_AMBULATORY_CARE_PROVIDER_SITE_OTHER): Payer: Medicare Other | Admitting: Emergency Medicine

## 2012-08-16 ENCOUNTER — Ambulatory Visit: Payer: Medicare Other | Admitting: Physical Therapy

## 2012-08-16 VITALS — BP 126/78 | HR 55 | Temp 98.0°F | Resp 16

## 2012-08-16 DIAGNOSIS — I8310 Varicose veins of unspecified lower extremity with inflammation: Secondary | ICD-10-CM

## 2012-08-16 DIAGNOSIS — L97909 Non-pressure chronic ulcer of unspecified part of unspecified lower leg with unspecified severity: Secondary | ICD-10-CM | POA: Diagnosis not present

## 2012-08-16 DIAGNOSIS — I872 Venous insufficiency (chronic) (peripheral): Secondary | ICD-10-CM

## 2012-08-16 DIAGNOSIS — M25559 Pain in unspecified hip: Secondary | ICD-10-CM | POA: Diagnosis not present

## 2012-08-16 DIAGNOSIS — IMO0001 Reserved for inherently not codable concepts without codable children: Secondary | ICD-10-CM | POA: Diagnosis not present

## 2012-08-16 DIAGNOSIS — R5381 Other malaise: Secondary | ICD-10-CM | POA: Diagnosis not present

## 2012-08-16 DIAGNOSIS — R262 Difficulty in walking, not elsewhere classified: Secondary | ICD-10-CM | POA: Diagnosis not present

## 2012-08-16 MED ORDER — SULFAMETHOXAZOLE-TMP DS 800-160 MG PO TABS: 1.0000 | ORAL_TABLET | Freq: Two times a day (BID) | ORAL | Status: DC

## 2012-08-16 NOTE — Progress Notes (Signed)
[facility] [address], [place] [address] [phone]  Date:  08/16/2012   Name:  Melanie Trevino   DOB:  [birth year]   MRN:  [id number]  PCP:  No primary provider on file.    Chief Complaint: Follow-up   History of Present Illness:  Melanie Trevino is a 78 y.o. very pleasant female patient who presents with the following:  Treated for hemorrhagic bullae and venous stasis dermatitis.  Blister has ruptured and sloughed. Now has open wound of leg.  No fever or chills, some erythema.  No drainage.  No increase in pain  There is no problem list on file for this patient.   No past medical history on file.  No past surgical history on file.  History  Substance Use Topics  . Smoking status: Never Smoker   . Smokeless tobacco: Not on file  . Alcohol Use: Not on file    No family history on file.  No Known Allergies  Medication list has been reviewed and updated.  Current Outpatient Prescriptions on File Prior to Visit  Medication Sig Dispense Refill  . imatinib (GLEEVEC) 100 MG tablet Take 100 mg by mouth daily. Take with meals and large glass of water.Caution:Chemotherapy      . losartan (COZAAR) 50 MG tablet Take 50 mg by mouth daily.      . methocarbamol (ROBAXIN) 500 MG tablet Take 500 mg by mouth 4 (four) times daily.      . traMADol (ULTRAM) 50 MG tablet Take 50 mg by mouth every 6 (six) hours as needed.        Review of Systems:  As per HPI, otherwise negative.    Physical Examination: Filed Vitals:   08/16/12 1209  BP: 126/78  Pulse: 55  Temp: 98 F (36.7 C)  Resp: 16   There were no vitals filed for this visit. There is no height or weight on file to calculate BMI. Ideal Body Weight:     GEN: WDWN, NAD, Non-toxic, Alert & Oriented x 3 HEENT: Atraumatic, Normocephalic.  Ears and Nose: No external deformity. EXTR: No clubbing/cyanosis/edema NEURO: Normal gait.  PSYCH: Normally interactive. Conversant. Not depressed or anxious  appearing.  Calm demeanor.  Left Leg:  Venous stasis dermatitis   Assessment and Plan: Venous stasis dermatitis Cellulitis Continue local wound care Septra due history of MRSA  Carmelina Dane, MD  I have reviewed and agree with documentation. Robert P. Merla Riches, M.D.

## 2012-08-18 ENCOUNTER — Ambulatory Visit: Payer: Medicare Other | Admitting: Physical Therapy

## 2012-08-18 ENCOUNTER — Ambulatory Visit (INDEPENDENT_AMBULATORY_CARE_PROVIDER_SITE_OTHER): Payer: Medicare Other | Admitting: Emergency Medicine

## 2012-08-18 VITALS — BP 128/76 | HR 65 | Temp 98.0°F | Resp 18

## 2012-08-18 DIAGNOSIS — R5381 Other malaise: Secondary | ICD-10-CM | POA: Diagnosis not present

## 2012-08-18 DIAGNOSIS — I8310 Varicose veins of unspecified lower extremity with inflammation: Secondary | ICD-10-CM | POA: Diagnosis not present

## 2012-08-18 DIAGNOSIS — C50919 Malignant neoplasm of unspecified site of unspecified female breast: Secondary | ICD-10-CM

## 2012-08-18 DIAGNOSIS — R262 Difficulty in walking, not elsewhere classified: Secondary | ICD-10-CM | POA: Diagnosis not present

## 2012-08-18 DIAGNOSIS — I83009 Varicose veins of unspecified lower extremity with ulcer of unspecified site: Secondary | ICD-10-CM | POA: Diagnosis not present

## 2012-08-18 DIAGNOSIS — M25559 Pain in unspecified hip: Secondary | ICD-10-CM | POA: Diagnosis not present

## 2012-08-18 DIAGNOSIS — L97909 Non-pressure chronic ulcer of unspecified part of unspecified lower leg with unspecified severity: Secondary | ICD-10-CM | POA: Insufficient documentation

## 2012-08-18 DIAGNOSIS — C229 Malignant neoplasm of liver, not specified as primary or secondary: Secondary | ICD-10-CM

## 2012-08-18 DIAGNOSIS — IMO0001 Reserved for inherently not codable concepts without codable children: Secondary | ICD-10-CM | POA: Diagnosis not present

## 2012-08-18 HISTORY — DX: Malignant neoplasm of unspecified site of unspecified female breast: C50.919

## 2012-08-18 NOTE — Progress Notes (Signed)
[facility] [address], [place] [address] [phone]  Date:  08/18/2012   Name:  Melanie Trevino   DOB:  1928/07/17   MRN:  [id number]  PCP:  No primary provider on file.    Chief Complaint: Venous Stasis Ulcer   History of Present Illness:  Melanie Trevino is a 78 y.o. very pleasant female patient who presents with the following:  Interval history of improvement.  Wound of left leg is improving. No fever or chills.  No drainage.  Patient Active Problem List  Diagnosis  . Venous stasis ulcer  . Liver cancer  . Breast cancer    No past medical history on file.  No past surgical history on file.  History  Substance Use Topics  . Smoking status: Never Smoker   . Smokeless tobacco: Not on file  . Alcohol Use: Not on file    No family history on file.  No Known Allergies  Medication list has been reviewed and updated.  Current Outpatient Prescriptions on File Prior to Visit  Medication Sig Dispense Refill  . imatinib (GLEEVEC) 100 MG tablet Take 100 mg by mouth daily. Take with meals and large glass of water.Caution:Chemotherapy      . losartan (COZAAR) 50 MG tablet Take 50 mg by mouth daily.      . methocarbamol (ROBAXIN) 500 MG tablet Take 500 mg by mouth 4 (four) times daily.      Marland Kitchen sulfamethoxazole-trimethoprim (BACTRIM DS) 800-160 MG per tablet Take 1 tablet by mouth 2 (two) times daily.  20 tablet  0  . traMADol (ULTRAM) 50 MG tablet Take 50 mg by mouth every 6 (six) hours as needed.        Review of Systems:  As per HPI, otherwise negative.    Physical Examination: Filed Vitals:   08/18/12 1657  BP: 128/76  Pulse: 65  Temp: 98 F (36.7 C)  Resp: 18   There were no vitals filed for this visit. There is no height or weight on file to calculate BMI. Ideal Body Weight:     GEN: WDWN, NAD, Non-toxic, Alert & Oriented x 3 HEENT: Atraumatic, Normocephalic.  Ears and Nose: No external deformity. EXTR: No  clubbing/cyanosis/edema NEURO: Normal gait.  PSYCH: Normally interactive. Conversant. Not depressed or anxious appearing.  Calm demeanor.  Wound: improvement in appearance of leg.  No cellulitis, no drainage  Assessment and Plan: Venous stasis ulcer leg Continue daily wound care Follow up in one week.  Carmelina Dane, MD  I have reviewed and agree with documentation. Robert P. Merla Riches, M.D.

## 2012-08-19 DIAGNOSIS — H902 Conductive hearing loss, unspecified: Secondary | ICD-10-CM | POA: Diagnosis not present

## 2012-08-20 DIAGNOSIS — I83009 Varicose veins of unspecified lower extremity with ulcer of unspecified site: Secondary | ICD-10-CM | POA: Diagnosis not present

## 2012-08-20 DIAGNOSIS — L97909 Non-pressure chronic ulcer of unspecified part of unspecified lower leg with unspecified severity: Secondary | ICD-10-CM | POA: Diagnosis not present

## 2012-08-20 DIAGNOSIS — L03119 Cellulitis of unspecified part of limb: Secondary | ICD-10-CM | POA: Diagnosis not present

## 2012-08-23 ENCOUNTER — Ambulatory Visit: Payer: Medicare Other | Admitting: Physical Therapy

## 2012-08-23 DIAGNOSIS — IMO0001 Reserved for inherently not codable concepts without codable children: Secondary | ICD-10-CM | POA: Diagnosis not present

## 2012-08-23 DIAGNOSIS — R5381 Other malaise: Secondary | ICD-10-CM | POA: Diagnosis not present

## 2012-08-23 DIAGNOSIS — R262 Difficulty in walking, not elsewhere classified: Secondary | ICD-10-CM | POA: Diagnosis not present

## 2012-08-23 DIAGNOSIS — M25559 Pain in unspecified hip: Secondary | ICD-10-CM | POA: Diagnosis not present

## 2012-08-25 ENCOUNTER — Ambulatory Visit (INDEPENDENT_AMBULATORY_CARE_PROVIDER_SITE_OTHER): Payer: Medicare Other | Admitting: Emergency Medicine

## 2012-08-25 VITALS — BP 120/68 | HR 72 | Temp 98.5°F | Resp 20

## 2012-08-25 DIAGNOSIS — L97909 Non-pressure chronic ulcer of unspecified part of unspecified lower leg with unspecified severity: Secondary | ICD-10-CM | POA: Diagnosis not present

## 2012-08-25 DIAGNOSIS — I83009 Varicose veins of unspecified lower extremity with ulcer of unspecified site: Secondary | ICD-10-CM | POA: Diagnosis not present

## 2012-08-25 NOTE — Progress Notes (Signed)
[facility] [address], [place] [address] [phone]  Date:  08/25/2012   Name:  Melanie Trevino   DOB:  1928-05-19   MRN:  [id number]  PCP:  No primary provider on file.    Chief Complaint: Follow-up   History of Present Illness:  Melanie Trevino is a 78 y.o. very pleasant female patient who presents with the following:  Under treatment for venous stasis ulcer left lower leg.  Doing well.  No fever or chills or drainage.  Patient Active Problem List  Diagnosis  . Venous stasis ulcer  . Liver cancer  . Breast cancer    No past medical history on file.  No past surgical history on file.  History  Substance Use Topics  . Smoking status: Never Smoker   . Smokeless tobacco: Not on file  . Alcohol Use: Not on file    No family history on file.  No Known Allergies  Medication list has been reviewed and updated.  Current Outpatient Prescriptions on File Prior to Visit  Medication Sig Dispense Refill  . imatinib (GLEEVEC) 100 MG tablet Take 100 mg by mouth daily. Take with meals and large glass of water.Caution:Chemotherapy      . losartan (COZAAR) 50 MG tablet Take 50 mg by mouth daily.      . methocarbamol (ROBAXIN) 500 MG tablet Take 500 mg by mouth 4 (four) times daily.      Marland Kitchen sulfamethoxazole-trimethoprim (BACTRIM DS) 800-160 MG per tablet Take 1 tablet by mouth 2 (two) times daily.  20 tablet  0  . traMADol (ULTRAM) 50 MG tablet Take 50 mg by mouth every 6 (six) hours as needed.        Review of Systems:  As per HPI, otherwise negative.    Physical Examination: Filed Vitals:   08/25/12 1806  BP: 120/68  Pulse: 72  Temp: 98.5 F (36.9 C)  Resp: 20   There were no vitals filed for this visit. There is no height or weight on file to calculate BMI. Ideal Body Weight:     GEN: WDWN, NAD, Non-toxic, Alert & Oriented x 3 HEENT: Atraumatic, Normocephalic.  Ears and Nose: No external deformity. EXTR: No  clubbing/cyanosis/edema NEURO: Normal gait.  PSYCH: Normally interactive. Conversant. Not depressed or anxious appearing.  Calm demeanor.  SKIN:  Improving. No cellulitis.  No drainage, ulcer smaller  Assessment and Plan: Venous stasis ulcer Daily dressing changes Follow up in one week  Carmelina Dane, MD  I have reviewed and agree with documentation. Robert P. Merla Riches, M.D.

## 2012-08-26 ENCOUNTER — Other Ambulatory Visit: Payer: Self-pay | Admitting: *Deleted

## 2012-08-26 ENCOUNTER — Ambulatory Visit: Payer: Medicare Other | Admitting: Physical Therapy

## 2012-08-26 DIAGNOSIS — R262 Difficulty in walking, not elsewhere classified: Secondary | ICD-10-CM | POA: Diagnosis not present

## 2012-08-26 DIAGNOSIS — I10 Essential (primary) hypertension: Secondary | ICD-10-CM | POA: Diagnosis not present

## 2012-08-26 DIAGNOSIS — R32 Unspecified urinary incontinence: Secondary | ICD-10-CM | POA: Diagnosis not present

## 2012-08-26 DIAGNOSIS — M169 Osteoarthritis of hip, unspecified: Secondary | ICD-10-CM | POA: Diagnosis not present

## 2012-08-26 DIAGNOSIS — C7889 Secondary malignant neoplasm of other digestive organs: Secondary | ICD-10-CM | POA: Diagnosis not present

## 2012-08-26 DIAGNOSIS — IMO0001 Reserved for inherently not codable concepts without codable children: Secondary | ICD-10-CM | POA: Diagnosis not present

## 2012-08-26 DIAGNOSIS — M25559 Pain in unspecified hip: Secondary | ICD-10-CM | POA: Diagnosis not present

## 2012-08-26 DIAGNOSIS — Z466 Encounter for fitting and adjustment of urinary device: Secondary | ICD-10-CM | POA: Diagnosis not present

## 2012-08-26 DIAGNOSIS — R5381 Other malaise: Secondary | ICD-10-CM | POA: Diagnosis not present

## 2012-08-27 ENCOUNTER — Telehealth: Payer: Self-pay

## 2012-08-27 DIAGNOSIS — M169 Osteoarthritis of hip, unspecified: Secondary | ICD-10-CM | POA: Diagnosis not present

## 2012-08-27 NOTE — Telephone Encounter (Signed)
Pharmacy sent refill request for rx and patient is checking on status. She is out of medication and doesn't know if she needs to come in since Dr Dareen Piano did not rx refills initially.  Best [phone]

## 2012-08-27 NOTE — Telephone Encounter (Signed)
What med is she in need of? Tried to call, line busy.

## 2012-08-28 NOTE — Telephone Encounter (Signed)
Number busy

## 2012-08-29 ENCOUNTER — Telehealth: Payer: Self-pay

## 2012-08-29 DIAGNOSIS — S81809A Unspecified open wound, unspecified lower leg, initial encounter: Secondary | ICD-10-CM | POA: Diagnosis not present

## 2012-08-29 DIAGNOSIS — C7889 Secondary malignant neoplasm of other digestive organs: Secondary | ICD-10-CM | POA: Diagnosis not present

## 2012-08-29 DIAGNOSIS — S81009A Unspecified open wound, unspecified knee, initial encounter: Secondary | ICD-10-CM | POA: Diagnosis not present

## 2012-08-29 DIAGNOSIS — Z466 Encounter for fitting and adjustment of urinary device: Secondary | ICD-10-CM | POA: Diagnosis not present

## 2012-08-29 DIAGNOSIS — I10 Essential (primary) hypertension: Secondary | ICD-10-CM | POA: Diagnosis not present

## 2012-08-29 DIAGNOSIS — M1710 Unilateral primary osteoarthritis, unspecified knee: Secondary | ICD-10-CM | POA: Diagnosis not present

## 2012-08-29 DIAGNOSIS — R32 Unspecified urinary incontinence: Secondary | ICD-10-CM | POA: Diagnosis not present

## 2012-08-29 NOTE — Telephone Encounter (Signed)
Spoke with pt, she needs refill on her Bactrim. She states her leg is better but not all the way gone. Can we refill for pt? Please advise

## 2012-08-29 NOTE — Telephone Encounter (Signed)
LMOM to CB. 

## 2012-08-30 NOTE — Telephone Encounter (Signed)
Dr. Dareen Piano - looks like she was doing better. Do you want to refill this?

## 2012-08-30 NOTE — Telephone Encounter (Signed)
Pt called again and would like to speak to Dr. Dareen Piano re: leg and medication.

## 2012-08-30 NOTE — Telephone Encounter (Signed)
No need for further antibiotic treatment.  She should follow up as we discussed.  thanks

## 2012-08-31 ENCOUNTER — Ambulatory Visit (INDEPENDENT_AMBULATORY_CARE_PROVIDER_SITE_OTHER): Payer: Medicare Other | Admitting: Emergency Medicine

## 2012-08-31 VITALS — BP 122/78 | HR 80 | Temp 99.7°F | Resp 18

## 2012-08-31 DIAGNOSIS — L97909 Non-pressure chronic ulcer of unspecified part of unspecified lower leg with unspecified severity: Secondary | ICD-10-CM

## 2012-08-31 DIAGNOSIS — I83009 Varicose veins of unspecified lower extremity with ulcer of unspecified site: Secondary | ICD-10-CM

## 2012-08-31 NOTE — Progress Notes (Signed)
  Subjective:    Patient ID: Melanie Trevino, female    DOB: 08/23/1928, 78 y.o.   MRN: [id number]  HPI  Venous stasis ulcer.  No drainage, cellulitis, fever or chills  Review of Systems    As per HPI, otherwise negative.   Objective:   Physical Exam   GEN: WDWN, NAD, Non-toxic, Alert & Oriented x 3 HEENT: Atraumatic, Normocephalic.  Ears and Nose: No external deformity. EXTR: No clubbing/cyanosis/edema NEURO: Normal gait.  PSYCH: Normally interactive. Conversant. Not depressed or anxious appearing.  Calm demeanor.  LEG:  Ulcer surface reepithelializing nicely with skin islands forming.       Assessment & Plan:  \ Continue daily dressing changes with xeroform no topical agents Follow up in two weeks

## 2012-08-31 NOTE — Telephone Encounter (Signed)
I have called patient to advise, she will come in today for recheck. I have advised her we can fast track. Front desk advised. Amy

## 2012-09-01 ENCOUNTER — Ambulatory Visit: Payer: Medicare Other | Admitting: Physical Therapy

## 2012-09-01 ENCOUNTER — Ambulatory Visit (INDEPENDENT_AMBULATORY_CARE_PROVIDER_SITE_OTHER): Payer: Medicare Other | Admitting: Family Medicine

## 2012-09-01 VITALS — BP 123/70 | HR 81 | Temp 98.2°F | Resp 18

## 2012-09-01 DIAGNOSIS — R262 Difficulty in walking, not elsewhere classified: Secondary | ICD-10-CM | POA: Diagnosis not present

## 2012-09-01 DIAGNOSIS — S81809A Unspecified open wound, unspecified lower leg, initial encounter: Secondary | ICD-10-CM | POA: Diagnosis not present

## 2012-09-01 DIAGNOSIS — I10 Essential (primary) hypertension: Secondary | ICD-10-CM | POA: Diagnosis not present

## 2012-09-01 DIAGNOSIS — R5381 Other malaise: Secondary | ICD-10-CM | POA: Diagnosis not present

## 2012-09-01 DIAGNOSIS — IMO0001 Reserved for inherently not codable concepts without codable children: Secondary | ICD-10-CM | POA: Diagnosis not present

## 2012-09-01 DIAGNOSIS — M25559 Pain in unspecified hip: Secondary | ICD-10-CM | POA: Diagnosis not present

## 2012-09-01 DIAGNOSIS — Z466 Encounter for fitting and adjustment of urinary device: Secondary | ICD-10-CM | POA: Diagnosis not present

## 2012-09-01 DIAGNOSIS — R32 Unspecified urinary incontinence: Secondary | ICD-10-CM | POA: Diagnosis not present

## 2012-09-01 DIAGNOSIS — T148XXA Other injury of unspecified body region, initial encounter: Secondary | ICD-10-CM | POA: Diagnosis not present

## 2012-09-01 DIAGNOSIS — C7889 Secondary malignant neoplasm of other digestive organs: Secondary | ICD-10-CM | POA: Diagnosis not present

## 2012-09-01 DIAGNOSIS — S81009A Unspecified open wound, unspecified knee, initial encounter: Secondary | ICD-10-CM | POA: Diagnosis not present

## 2012-09-01 NOTE — Patient Instructions (Signed)
Skin Tear Laceration Care  A skin tear is when the top layer of skin has been peeled off. This is a common problem with aging as the skin becomes thinner and more fragile.  This is often repaired with tape or skin adhesive strips. This keeps the skin that has been peeled off in contact with the healthier skin beneath. Sometimes the peeled skin will grow back on but often it turns black and dies. Even when this happens, the torn skin acts as a good dressing until the skin underneath gets healthier and repairs itself.  HOME CARE INSTRUCTIONS    Only take over-the-counter or prescription medicines for pain, discomfort or fever as directed by your caregiver.   A dressing, depending on the location of the laceration, may have been applied over the tape or skin adhesive strips. This may be changed once per day or as instructed. If the dressing sticks, it may be soaked off with warm soapy water.  You might need a tetanus shot now if:   You have no idea when you had the last one.   You have never had a tetanus shot before.   Your wound had dirt in it.  If you need a tetanus shot, and you choose not to get one, there is a rare chance of getting tetanus. Sickness from tetanus can be serious. If you got a tetanus shot, your arm may swell, get red and warm to the touch at the shot site. This is common and not a problem.  SEEK IMMEDIATE MEDICAL CARE IF:    There is redness, swelling or increasing pain in the wound.   Pus is coming from wound.   An unexplained oral temperature above 102 F (38.9 C) develops.   You notice a bad smell coming from the wound or dressing.  Document Released: 07/15/2001 Document Revised: 01/12/2012 Document Reviewed: 01/22/2009  [employer] Patient Information 2013 [employer]

## 2012-09-01 NOTE — Progress Notes (Signed)
  Subjective:    Patient ID: Melanie Trevino, female    DOB: May 12, 1928, 78 y.o.   MRN: [id number]  HPI   Was at PT today and was in motorized wheelchair, scrapped leg against bed and ripped skin off on leg so put gauze on it and came straight here for eval.  No past medical history on file. Patient Active Problem List  Diagnosis  . Venous stasis ulcer  . Liver cancer  . Breast cancer     Review of Systems  Constitutional: Negative for fever and chills.  Cardiovascular: Positive for leg swelling.  Musculoskeletal: Positive for gait problem.  Skin: Positive for color change, rash and wound.        BP 123/70  Pulse 81  Temp 98.2 F (36.8 C) (Oral)  Resp 18  Objective:   Physical Exam  Constitutional: She appears well-developed. No distress.  Pulmonary/Chest: Effort normal.  Neurological: She is alert.  Skin: Skin is warm. Ecchymosis noted. She is not diaphoretic.          Left lower leg - mid-shin - approx 4-5 cm dm hematoma w/ 5 discrete small skin tears approx 1/2 to 1 cm length. The most distal medial one still oozing bright red blood but all others hemostatic.  Psychiatric: She has a normal mood and affect. Her behavior is normal.          Assessment & Plan:  1. Skin tear - No need to tape at this time due to small lacerations w/o gaping and good approximation at baseline.  Placed non-stick pad against hematoma and then pressure dressing applied w/ ace wrap.  Change pad daily and replace wrap. Recheck in 5d, RTC immed if extension of lesion or if keeps bleeding.

## 2012-09-02 ENCOUNTER — Encounter (HOSPITAL_BASED_OUTPATIENT_CLINIC_OR_DEPARTMENT_OTHER): Payer: Medicare Other | Attending: Internal Medicine

## 2012-09-02 DIAGNOSIS — Z79899 Other long term (current) drug therapy: Secondary | ICD-10-CM | POA: Insufficient documentation

## 2012-09-02 DIAGNOSIS — I10 Essential (primary) hypertension: Secondary | ICD-10-CM | POA: Diagnosis not present

## 2012-09-02 DIAGNOSIS — E7800 Pure hypercholesterolemia, unspecified: Secondary | ICD-10-CM | POA: Diagnosis not present

## 2012-09-02 DIAGNOSIS — I83009 Varicose veins of unspecified lower extremity with ulcer of unspecified site: Secondary | ICD-10-CM | POA: Insufficient documentation

## 2012-09-02 DIAGNOSIS — I872 Venous insufficiency (chronic) (peripheral): Secondary | ICD-10-CM | POA: Insufficient documentation

## 2012-09-02 DIAGNOSIS — G609 Hereditary and idiopathic neuropathy, unspecified: Secondary | ICD-10-CM | POA: Insufficient documentation

## 2012-09-02 DIAGNOSIS — X58XXXA Exposure to other specified factors, initial encounter: Secondary | ICD-10-CM | POA: Insufficient documentation

## 2012-09-02 DIAGNOSIS — S81009A Unspecified open wound, unspecified knee, initial encounter: Secondary | ICD-10-CM | POA: Diagnosis not present

## 2012-09-02 DIAGNOSIS — S91009A Unspecified open wound, unspecified ankle, initial encounter: Secondary | ICD-10-CM | POA: Insufficient documentation

## 2012-09-02 DIAGNOSIS — L97909 Non-pressure chronic ulcer of unspecified part of unspecified lower leg with unspecified severity: Secondary | ICD-10-CM | POA: Insufficient documentation

## 2012-09-02 DIAGNOSIS — S8010XA Contusion of unspecified lower leg, initial encounter: Secondary | ICD-10-CM | POA: Diagnosis not present

## 2012-09-02 NOTE — Progress Notes (Signed)
[facility]  NAME:  Melanie Trevino, Melanie Trevino                  ACCOUNT NO.:  [id number]  MEDICAL RECORD NO.:  [id number]      DATE OF BIRTH:  February 15, 1928  PHYSICIAN:  Maxwell Caul, M.D. VISIT DATE:  09/02/2012                                  OFFICE VISIT   Melanie Trevino is an 78 year old woman who was kindly referred through [facility] (Dr. Donette Larry).  She arrives accompanied by her husband.  Apparently two weeks ago was noted to have increasing pain and swelling over her left anterior leg.  An open area appeared here.  She was seen at urgent care, given Septra for 4 days.  I think when she saw Dr. Donette Larry on October 18, Keflex was also added.  Yesterday, she traumatized the same area of the left leg.  She again went to the urgent care.  I think she had a hematoma that was opened slightly.  She is here for our review of both of these problems.  PAST MEDICAL HISTORY:  Hypertension, chronic edema, osteoarthritis, GIST tumor, peripheral neuropathy, hypercholesterolemia.  SURGICAL HISTORY:  Mastectomy on the right, history of CNS tumor (I have no information on this), colon surgery and tonsillectomy.  MEDICATIONS:  Medication list is reviewed.  She is on; 1. Gleevec 400 daily. 2. Lasix 40 daily. 3. Potassium chloride 20 mEq b.i.d. 4. Clonazepam 0.5 twice daily. 5. Cozaar 50 b.i.d. 6. Tramadol one tablet q.6 p.r.n. 7. Methocarbamol 500 t.i.d. daily.  PHYSICAL EXAMINATION:  VITAL SIGNS:  Temperature is 98.3, pulse 65, respirations 18, blood pressure 121/80, respiratory shallow but otherwise clear air entry. CARDIAC:  Heart sounds are normal.  There is no signs of congestive heart failure. EXTREMITIES:  She has bilateral venous stasis.  She had an above knee stocking on the right.  I did not disturb this.  The area in question here is on the left leg anteriorly.  There is very significant venous stasis.  Her peripheral pulses are difficult to feel.  Her  ABI calculated in this clinic is 0.7.  The area that was I think her original opening has just about fully epithelialized.  More concerning to this is just of the lateral aspect of the original wound is what appears to be a hematoma.  This may have been opened in urgent care or opened spontaneously.  I am not certain of the viability of the overlying skin here.  IMPRESSION:  Left anterior leg wound, chronic venous stasis ulcer with varicose veins.  She had an hematoma from yesterday that is opened.  We have put foam over this entire area and a Kerlix Coban wrap.  The original wound that Dr. Donette Larry sent her here for, I think is well on its way to resolution.  I am more concerned about the traumatic area from yesterday.  Nevertheless nothing appeared to be infected here and no cultures were done.          ______________________________ Maxwell Caul, M.D.     MGR/MEDQ  D:  09/02/2012  T:  09/02/2012  Job:  [id number]

## 2012-09-06 NOTE — Progress Notes (Signed)
Reviewed and agree.

## 2012-09-09 ENCOUNTER — Encounter (HOSPITAL_BASED_OUTPATIENT_CLINIC_OR_DEPARTMENT_OTHER): Payer: Medicare Other | Attending: Internal Medicine

## 2012-09-09 DIAGNOSIS — L97809 Non-pressure chronic ulcer of other part of unspecified lower leg with unspecified severity: Secondary | ICD-10-CM | POA: Diagnosis not present

## 2012-09-09 DIAGNOSIS — S8010XA Contusion of unspecified lower leg, initial encounter: Secondary | ICD-10-CM | POA: Insufficient documentation

## 2012-09-09 DIAGNOSIS — I8390 Asymptomatic varicose veins of unspecified lower extremity: Secondary | ICD-10-CM | POA: Insufficient documentation

## 2012-09-09 DIAGNOSIS — I872 Venous insufficiency (chronic) (peripheral): Secondary | ICD-10-CM | POA: Diagnosis not present

## 2012-09-09 DIAGNOSIS — X58XXXA Exposure to other specified factors, initial encounter: Secondary | ICD-10-CM | POA: Insufficient documentation

## 2012-09-16 DIAGNOSIS — S8010XA Contusion of unspecified lower leg, initial encounter: Secondary | ICD-10-CM | POA: Diagnosis not present

## 2012-09-16 DIAGNOSIS — I872 Venous insufficiency (chronic) (peripheral): Secondary | ICD-10-CM | POA: Diagnosis not present

## 2012-09-16 DIAGNOSIS — L97809 Non-pressure chronic ulcer of other part of unspecified lower leg with unspecified severity: Secondary | ICD-10-CM | POA: Diagnosis not present

## 2012-09-16 DIAGNOSIS — I8390 Asymptomatic varicose veins of unspecified lower extremity: Secondary | ICD-10-CM | POA: Diagnosis not present

## 2012-09-20 DIAGNOSIS — C7889 Secondary malignant neoplasm of other digestive organs: Secondary | ICD-10-CM | POA: Diagnosis not present

## 2012-09-20 DIAGNOSIS — I10 Essential (primary) hypertension: Secondary | ICD-10-CM | POA: Diagnosis not present

## 2012-09-20 DIAGNOSIS — S91009A Unspecified open wound, unspecified ankle, initial encounter: Secondary | ICD-10-CM | POA: Diagnosis not present

## 2012-09-20 DIAGNOSIS — Z466 Encounter for fitting and adjustment of urinary device: Secondary | ICD-10-CM | POA: Diagnosis not present

## 2012-09-20 DIAGNOSIS — R32 Unspecified urinary incontinence: Secondary | ICD-10-CM | POA: Diagnosis not present

## 2012-09-23 DIAGNOSIS — H6500 Acute serous otitis media, unspecified ear: Secondary | ICD-10-CM | POA: Diagnosis not present

## 2012-09-23 DIAGNOSIS — H902 Conductive hearing loss, unspecified: Secondary | ICD-10-CM | POA: Diagnosis not present

## 2012-10-04 DIAGNOSIS — M1710 Unilateral primary osteoarthritis, unspecified knee: Secondary | ICD-10-CM | POA: Diagnosis not present

## 2012-10-06 DIAGNOSIS — Z853 Personal history of malignant neoplasm of breast: Secondary | ICD-10-CM | POA: Diagnosis not present

## 2012-10-06 DIAGNOSIS — R1909 Other intra-abdominal and pelvic swelling, mass and lump: Secondary | ICD-10-CM | POA: Diagnosis not present

## 2012-10-06 DIAGNOSIS — K7689 Other specified diseases of liver: Secondary | ICD-10-CM | POA: Diagnosis not present

## 2012-10-06 DIAGNOSIS — C499 Malignant neoplasm of connective and soft tissue, unspecified: Secondary | ICD-10-CM | POA: Diagnosis not present

## 2012-10-07 ENCOUNTER — Encounter (HOSPITAL_BASED_OUTPATIENT_CLINIC_OR_DEPARTMENT_OTHER): Payer: Medicare Other

## 2012-10-11 DIAGNOSIS — M2040 Other hammer toe(s) (acquired), unspecified foot: Secondary | ICD-10-CM | POA: Diagnosis not present

## 2012-10-11 DIAGNOSIS — B351 Tinea unguium: Secondary | ICD-10-CM | POA: Diagnosis not present

## 2012-10-20 DIAGNOSIS — Z466 Encounter for fitting and adjustment of urinary device: Secondary | ICD-10-CM | POA: Diagnosis not present

## 2012-10-20 DIAGNOSIS — C7889 Secondary malignant neoplasm of other digestive organs: Secondary | ICD-10-CM | POA: Diagnosis not present

## 2012-10-20 DIAGNOSIS — R32 Unspecified urinary incontinence: Secondary | ICD-10-CM | POA: Diagnosis not present

## 2012-10-20 DIAGNOSIS — S91009A Unspecified open wound, unspecified ankle, initial encounter: Secondary | ICD-10-CM | POA: Diagnosis not present

## 2012-10-20 DIAGNOSIS — I10 Essential (primary) hypertension: Secondary | ICD-10-CM | POA: Diagnosis not present

## 2012-10-28 DIAGNOSIS — Z466 Encounter for fitting and adjustment of urinary device: Secondary | ICD-10-CM | POA: Diagnosis not present

## 2012-10-28 DIAGNOSIS — N319 Neuromuscular dysfunction of bladder, unspecified: Secondary | ICD-10-CM | POA: Diagnosis not present

## 2012-11-05 DIAGNOSIS — N319 Neuromuscular dysfunction of bladder, unspecified: Secondary | ICD-10-CM | POA: Diagnosis not present

## 2012-11-05 DIAGNOSIS — Z466 Encounter for fitting and adjustment of urinary device: Secondary | ICD-10-CM | POA: Diagnosis not present

## 2012-11-15 DIAGNOSIS — I10 Essential (primary) hypertension: Secondary | ICD-10-CM | POA: Diagnosis not present

## 2012-11-19 DIAGNOSIS — Z466 Encounter for fitting and adjustment of urinary device: Secondary | ICD-10-CM | POA: Diagnosis not present

## 2012-11-19 DIAGNOSIS — N319 Neuromuscular dysfunction of bladder, unspecified: Secondary | ICD-10-CM | POA: Diagnosis not present

## 2012-12-10 ENCOUNTER — Ambulatory Visit: Payer: Medicare Other | Attending: Internal Medicine | Admitting: Physical Therapy

## 2012-12-10 DIAGNOSIS — R5381 Other malaise: Secondary | ICD-10-CM | POA: Diagnosis not present

## 2012-12-10 DIAGNOSIS — M25559 Pain in unspecified hip: Secondary | ICD-10-CM | POA: Diagnosis not present

## 2012-12-10 DIAGNOSIS — IMO0001 Reserved for inherently not codable concepts without codable children: Secondary | ICD-10-CM | POA: Diagnosis not present

## 2012-12-15 ENCOUNTER — Ambulatory Visit: Payer: Medicare Other | Admitting: Physical Therapy

## 2012-12-15 ENCOUNTER — Ambulatory Visit: Payer: Medicare Other

## 2012-12-15 DIAGNOSIS — M25559 Pain in unspecified hip: Secondary | ICD-10-CM | POA: Diagnosis not present

## 2012-12-15 DIAGNOSIS — R5381 Other malaise: Secondary | ICD-10-CM | POA: Diagnosis not present

## 2012-12-15 DIAGNOSIS — IMO0001 Reserved for inherently not codable concepts without codable children: Secondary | ICD-10-CM | POA: Diagnosis not present

## 2012-12-21 ENCOUNTER — Ambulatory Visit: Payer: Medicare Other | Admitting: Physical Therapy

## 2012-12-21 DIAGNOSIS — IMO0001 Reserved for inherently not codable concepts without codable children: Secondary | ICD-10-CM | POA: Diagnosis not present

## 2012-12-21 DIAGNOSIS — M25559 Pain in unspecified hip: Secondary | ICD-10-CM | POA: Diagnosis not present

## 2012-12-21 DIAGNOSIS — R5381 Other malaise: Secondary | ICD-10-CM | POA: Diagnosis not present

## 2012-12-23 ENCOUNTER — Ambulatory Visit: Payer: Medicare Other

## 2012-12-23 ENCOUNTER — Encounter: Payer: Medicare Other | Admitting: Physical Therapy

## 2012-12-27 ENCOUNTER — Ambulatory Visit: Payer: Medicare Other | Admitting: Physical Therapy

## 2012-12-27 DIAGNOSIS — N319 Neuromuscular dysfunction of bladder, unspecified: Secondary | ICD-10-CM | POA: Diagnosis not present

## 2012-12-29 ENCOUNTER — Ambulatory Visit: Payer: Medicare Other | Admitting: Physical Therapy

## 2013-01-03 ENCOUNTER — Ambulatory Visit: Payer: Medicare Other | Attending: Internal Medicine | Admitting: Physical Therapy

## 2013-01-03 DIAGNOSIS — M25559 Pain in unspecified hip: Secondary | ICD-10-CM | POA: Insufficient documentation

## 2013-01-03 DIAGNOSIS — IMO0001 Reserved for inherently not codable concepts without codable children: Secondary | ICD-10-CM | POA: Insufficient documentation

## 2013-01-03 DIAGNOSIS — R5381 Other malaise: Secondary | ICD-10-CM | POA: Diagnosis not present

## 2013-01-05 ENCOUNTER — Ambulatory Visit: Payer: Medicare Other | Admitting: Physical Therapy

## 2013-01-18 DIAGNOSIS — N319 Neuromuscular dysfunction of bladder, unspecified: Secondary | ICD-10-CM | POA: Diagnosis not present

## 2013-01-18 DIAGNOSIS — Z466 Encounter for fitting and adjustment of urinary device: Secondary | ICD-10-CM | POA: Diagnosis not present

## 2013-01-21 DIAGNOSIS — Z466 Encounter for fitting and adjustment of urinary device: Secondary | ICD-10-CM | POA: Diagnosis not present

## 2013-01-21 DIAGNOSIS — N319 Neuromuscular dysfunction of bladder, unspecified: Secondary | ICD-10-CM | POA: Diagnosis not present

## 2013-01-31 DIAGNOSIS — N319 Neuromuscular dysfunction of bladder, unspecified: Secondary | ICD-10-CM | POA: Diagnosis not present

## 2013-01-31 DIAGNOSIS — Z466 Encounter for fitting and adjustment of urinary device: Secondary | ICD-10-CM | POA: Diagnosis not present

## 2013-02-01 DIAGNOSIS — I700 Atherosclerosis of aorta: Secondary | ICD-10-CM | POA: Diagnosis not present

## 2013-02-01 DIAGNOSIS — Z978 Presence of other specified devices: Secondary | ICD-10-CM | POA: Diagnosis not present

## 2013-02-01 DIAGNOSIS — C179 Malignant neoplasm of small intestine, unspecified: Secondary | ICD-10-CM | POA: Diagnosis not present

## 2013-02-01 DIAGNOSIS — C494 Malignant neoplasm of connective and soft tissue of abdomen: Secondary | ICD-10-CM | POA: Diagnosis not present

## 2013-02-01 DIAGNOSIS — Z853 Personal history of malignant neoplasm of breast: Secondary | ICD-10-CM | POA: Diagnosis not present

## 2013-02-01 DIAGNOSIS — C499 Malignant neoplasm of connective and soft tissue, unspecified: Secondary | ICD-10-CM | POA: Diagnosis not present

## 2013-02-01 DIAGNOSIS — M899 Disorder of bone, unspecified: Secondary | ICD-10-CM | POA: Diagnosis not present

## 2013-02-01 DIAGNOSIS — D7389 Other diseases of spleen: Secondary | ICD-10-CM | POA: Diagnosis not present

## 2013-02-01 DIAGNOSIS — N289 Disorder of kidney and ureter, unspecified: Secondary | ICD-10-CM | POA: Diagnosis not present

## 2013-02-01 DIAGNOSIS — K7689 Other specified diseases of liver: Secondary | ICD-10-CM | POA: Diagnosis not present

## 2013-02-01 DIAGNOSIS — K469 Unspecified abdominal hernia without obstruction or gangrene: Secondary | ICD-10-CM | POA: Diagnosis not present

## 2013-02-01 DIAGNOSIS — M47817 Spondylosis without myelopathy or radiculopathy, lumbosacral region: Secondary | ICD-10-CM | POA: Diagnosis not present

## 2013-02-01 DIAGNOSIS — Z90710 Acquired absence of both cervix and uterus: Secondary | ICD-10-CM | POA: Diagnosis not present

## 2013-02-01 DIAGNOSIS — M169 Osteoarthritis of hip, unspecified: Secondary | ICD-10-CM | POA: Diagnosis not present

## 2013-02-01 DIAGNOSIS — K8689 Other specified diseases of pancreas: Secondary | ICD-10-CM | POA: Diagnosis not present

## 2013-02-01 DIAGNOSIS — I2510 Atherosclerotic heart disease of native coronary artery without angina pectoris: Secondary | ICD-10-CM | POA: Diagnosis not present

## 2013-02-03 DIAGNOSIS — N319 Neuromuscular dysfunction of bladder, unspecified: Secondary | ICD-10-CM | POA: Diagnosis not present

## 2013-02-03 DIAGNOSIS — Z466 Encounter for fitting and adjustment of urinary device: Secondary | ICD-10-CM | POA: Diagnosis not present

## 2013-02-07 DIAGNOSIS — Z466 Encounter for fitting and adjustment of urinary device: Secondary | ICD-10-CM | POA: Diagnosis not present

## 2013-02-07 DIAGNOSIS — N319 Neuromuscular dysfunction of bladder, unspecified: Secondary | ICD-10-CM | POA: Diagnosis not present

## 2013-02-09 DIAGNOSIS — N319 Neuromuscular dysfunction of bladder, unspecified: Secondary | ICD-10-CM | POA: Diagnosis not present

## 2013-02-09 DIAGNOSIS — Z466 Encounter for fitting and adjustment of urinary device: Secondary | ICD-10-CM | POA: Diagnosis not present

## 2013-02-14 DIAGNOSIS — Z466 Encounter for fitting and adjustment of urinary device: Secondary | ICD-10-CM | POA: Diagnosis not present

## 2013-02-14 DIAGNOSIS — N319 Neuromuscular dysfunction of bladder, unspecified: Secondary | ICD-10-CM | POA: Diagnosis not present

## 2013-02-15 DIAGNOSIS — R269 Unspecified abnormalities of gait and mobility: Secondary | ICD-10-CM | POA: Diagnosis not present

## 2013-02-15 DIAGNOSIS — M1990 Unspecified osteoarthritis, unspecified site: Secondary | ICD-10-CM | POA: Diagnosis not present

## 2013-02-15 DIAGNOSIS — I10 Essential (primary) hypertension: Secondary | ICD-10-CM | POA: Diagnosis not present

## 2013-02-15 DIAGNOSIS — C494 Malignant neoplasm of connective and soft tissue of abdomen: Secondary | ICD-10-CM | POA: Diagnosis not present

## 2013-02-15 DIAGNOSIS — R609 Edema, unspecified: Secondary | ICD-10-CM | POA: Diagnosis not present

## 2013-02-15 DIAGNOSIS — G609 Hereditary and idiopathic neuropathy, unspecified: Secondary | ICD-10-CM | POA: Diagnosis not present

## 2013-02-16 DIAGNOSIS — Z466 Encounter for fitting and adjustment of urinary device: Secondary | ICD-10-CM | POA: Diagnosis not present

## 2013-02-16 DIAGNOSIS — N319 Neuromuscular dysfunction of bladder, unspecified: Secondary | ICD-10-CM | POA: Diagnosis not present

## 2013-02-17 DIAGNOSIS — N319 Neuromuscular dysfunction of bladder, unspecified: Secondary | ICD-10-CM | POA: Diagnosis not present

## 2013-02-17 DIAGNOSIS — Z466 Encounter for fitting and adjustment of urinary device: Secondary | ICD-10-CM | POA: Diagnosis not present

## 2013-02-24 DIAGNOSIS — N319 Neuromuscular dysfunction of bladder, unspecified: Secondary | ICD-10-CM | POA: Diagnosis not present

## 2013-02-24 DIAGNOSIS — Z466 Encounter for fitting and adjustment of urinary device: Secondary | ICD-10-CM | POA: Diagnosis not present

## 2013-02-25 DIAGNOSIS — R262 Difficulty in walking, not elsewhere classified: Secondary | ICD-10-CM | POA: Diagnosis not present

## 2013-02-25 DIAGNOSIS — Z466 Encounter for fitting and adjustment of urinary device: Secondary | ICD-10-CM | POA: Diagnosis not present

## 2013-02-25 DIAGNOSIS — R32 Unspecified urinary incontinence: Secondary | ICD-10-CM | POA: Diagnosis not present

## 2013-02-25 DIAGNOSIS — M6281 Muscle weakness (generalized): Secondary | ICD-10-CM | POA: Diagnosis not present

## 2013-02-28 DIAGNOSIS — M6281 Muscle weakness (generalized): Secondary | ICD-10-CM | POA: Diagnosis not present

## 2013-02-28 DIAGNOSIS — R32 Unspecified urinary incontinence: Secondary | ICD-10-CM | POA: Diagnosis not present

## 2013-02-28 DIAGNOSIS — R262 Difficulty in walking, not elsewhere classified: Secondary | ICD-10-CM | POA: Diagnosis not present

## 2013-02-28 DIAGNOSIS — Z466 Encounter for fitting and adjustment of urinary device: Secondary | ICD-10-CM | POA: Diagnosis not present

## 2013-03-01 DIAGNOSIS — Z466 Encounter for fitting and adjustment of urinary device: Secondary | ICD-10-CM | POA: Diagnosis not present

## 2013-03-01 DIAGNOSIS — M6281 Muscle weakness (generalized): Secondary | ICD-10-CM | POA: Diagnosis not present

## 2013-03-01 DIAGNOSIS — R262 Difficulty in walking, not elsewhere classified: Secondary | ICD-10-CM | POA: Diagnosis not present

## 2013-03-01 DIAGNOSIS — R32 Unspecified urinary incontinence: Secondary | ICD-10-CM | POA: Diagnosis not present

## 2013-03-07 DIAGNOSIS — M6281 Muscle weakness (generalized): Secondary | ICD-10-CM | POA: Diagnosis not present

## 2013-03-07 DIAGNOSIS — R262 Difficulty in walking, not elsewhere classified: Secondary | ICD-10-CM | POA: Diagnosis not present

## 2013-03-07 DIAGNOSIS — R32 Unspecified urinary incontinence: Secondary | ICD-10-CM | POA: Diagnosis not present

## 2013-03-07 DIAGNOSIS — Z466 Encounter for fitting and adjustment of urinary device: Secondary | ICD-10-CM | POA: Diagnosis not present

## 2013-03-10 DIAGNOSIS — M6281 Muscle weakness (generalized): Secondary | ICD-10-CM | POA: Diagnosis not present

## 2013-03-10 DIAGNOSIS — R32 Unspecified urinary incontinence: Secondary | ICD-10-CM | POA: Diagnosis not present

## 2013-03-10 DIAGNOSIS — R262 Difficulty in walking, not elsewhere classified: Secondary | ICD-10-CM | POA: Diagnosis not present

## 2013-03-10 DIAGNOSIS — Z466 Encounter for fitting and adjustment of urinary device: Secondary | ICD-10-CM | POA: Diagnosis not present

## 2013-03-14 DIAGNOSIS — R269 Unspecified abnormalities of gait and mobility: Secondary | ICD-10-CM | POA: Diagnosis not present

## 2013-03-15 DIAGNOSIS — Z466 Encounter for fitting and adjustment of urinary device: Secondary | ICD-10-CM | POA: Diagnosis not present

## 2013-03-15 DIAGNOSIS — M6281 Muscle weakness (generalized): Secondary | ICD-10-CM | POA: Diagnosis not present

## 2013-03-15 DIAGNOSIS — R262 Difficulty in walking, not elsewhere classified: Secondary | ICD-10-CM | POA: Diagnosis not present

## 2013-03-15 DIAGNOSIS — R32 Unspecified urinary incontinence: Secondary | ICD-10-CM | POA: Diagnosis not present

## 2013-03-18 DIAGNOSIS — Z466 Encounter for fitting and adjustment of urinary device: Secondary | ICD-10-CM | POA: Diagnosis not present

## 2013-03-18 DIAGNOSIS — R262 Difficulty in walking, not elsewhere classified: Secondary | ICD-10-CM | POA: Diagnosis not present

## 2013-03-18 DIAGNOSIS — R32 Unspecified urinary incontinence: Secondary | ICD-10-CM | POA: Diagnosis not present

## 2013-03-18 DIAGNOSIS — M6281 Muscle weakness (generalized): Secondary | ICD-10-CM | POA: Diagnosis not present

## 2013-03-21 DIAGNOSIS — M6281 Muscle weakness (generalized): Secondary | ICD-10-CM | POA: Diagnosis not present

## 2013-03-21 DIAGNOSIS — R32 Unspecified urinary incontinence: Secondary | ICD-10-CM | POA: Diagnosis not present

## 2013-03-21 DIAGNOSIS — R262 Difficulty in walking, not elsewhere classified: Secondary | ICD-10-CM | POA: Diagnosis not present

## 2013-03-21 DIAGNOSIS — Z466 Encounter for fitting and adjustment of urinary device: Secondary | ICD-10-CM | POA: Diagnosis not present

## 2013-03-23 DIAGNOSIS — Z466 Encounter for fitting and adjustment of urinary device: Secondary | ICD-10-CM | POA: Diagnosis not present

## 2013-03-23 DIAGNOSIS — R32 Unspecified urinary incontinence: Secondary | ICD-10-CM | POA: Diagnosis not present

## 2013-03-23 DIAGNOSIS — M6281 Muscle weakness (generalized): Secondary | ICD-10-CM | POA: Diagnosis not present

## 2013-03-23 DIAGNOSIS — R262 Difficulty in walking, not elsewhere classified: Secondary | ICD-10-CM | POA: Diagnosis not present

## 2013-03-30 DIAGNOSIS — R262 Difficulty in walking, not elsewhere classified: Secondary | ICD-10-CM | POA: Diagnosis not present

## 2013-03-30 DIAGNOSIS — R32 Unspecified urinary incontinence: Secondary | ICD-10-CM | POA: Diagnosis not present

## 2013-03-30 DIAGNOSIS — M6281 Muscle weakness (generalized): Secondary | ICD-10-CM | POA: Diagnosis not present

## 2013-03-30 DIAGNOSIS — Z466 Encounter for fitting and adjustment of urinary device: Secondary | ICD-10-CM | POA: Diagnosis not present

## 2013-03-31 DIAGNOSIS — M6281 Muscle weakness (generalized): Secondary | ICD-10-CM | POA: Diagnosis not present

## 2013-03-31 DIAGNOSIS — R32 Unspecified urinary incontinence: Secondary | ICD-10-CM | POA: Diagnosis not present

## 2013-03-31 DIAGNOSIS — R262 Difficulty in walking, not elsewhere classified: Secondary | ICD-10-CM | POA: Diagnosis not present

## 2013-03-31 DIAGNOSIS — Z466 Encounter for fitting and adjustment of urinary device: Secondary | ICD-10-CM | POA: Diagnosis not present

## 2013-04-12 DIAGNOSIS — M6281 Muscle weakness (generalized): Secondary | ICD-10-CM | POA: Diagnosis not present

## 2013-04-12 DIAGNOSIS — R262 Difficulty in walking, not elsewhere classified: Secondary | ICD-10-CM | POA: Diagnosis not present

## 2013-04-12 DIAGNOSIS — Z466 Encounter for fitting and adjustment of urinary device: Secondary | ICD-10-CM | POA: Diagnosis not present

## 2013-04-12 DIAGNOSIS — R32 Unspecified urinary incontinence: Secondary | ICD-10-CM | POA: Diagnosis not present

## 2013-04-14 DIAGNOSIS — M6281 Muscle weakness (generalized): Secondary | ICD-10-CM | POA: Diagnosis not present

## 2013-04-14 DIAGNOSIS — Z466 Encounter for fitting and adjustment of urinary device: Secondary | ICD-10-CM | POA: Diagnosis not present

## 2013-04-14 DIAGNOSIS — R32 Unspecified urinary incontinence: Secondary | ICD-10-CM | POA: Diagnosis not present

## 2013-04-14 DIAGNOSIS — R262 Difficulty in walking, not elsewhere classified: Secondary | ICD-10-CM | POA: Diagnosis not present

## 2013-04-21 DIAGNOSIS — R32 Unspecified urinary incontinence: Secondary | ICD-10-CM | POA: Diagnosis not present

## 2013-04-21 DIAGNOSIS — R262 Difficulty in walking, not elsewhere classified: Secondary | ICD-10-CM | POA: Diagnosis not present

## 2013-04-21 DIAGNOSIS — M6281 Muscle weakness (generalized): Secondary | ICD-10-CM | POA: Diagnosis not present

## 2013-04-21 DIAGNOSIS — Z466 Encounter for fitting and adjustment of urinary device: Secondary | ICD-10-CM | POA: Diagnosis not present

## 2013-04-25 DIAGNOSIS — R32 Unspecified urinary incontinence: Secondary | ICD-10-CM | POA: Diagnosis not present

## 2013-04-25 DIAGNOSIS — R262 Difficulty in walking, not elsewhere classified: Secondary | ICD-10-CM | POA: Diagnosis not present

## 2013-04-25 DIAGNOSIS — Z466 Encounter for fitting and adjustment of urinary device: Secondary | ICD-10-CM | POA: Diagnosis not present

## 2013-04-25 DIAGNOSIS — M6281 Muscle weakness (generalized): Secondary | ICD-10-CM | POA: Diagnosis not present

## 2013-04-26 DIAGNOSIS — R262 Difficulty in walking, not elsewhere classified: Secondary | ICD-10-CM | POA: Diagnosis not present

## 2013-04-26 DIAGNOSIS — R32 Unspecified urinary incontinence: Secondary | ICD-10-CM | POA: Diagnosis not present

## 2013-04-26 DIAGNOSIS — M6281 Muscle weakness (generalized): Secondary | ICD-10-CM | POA: Diagnosis not present

## 2013-04-26 DIAGNOSIS — Z466 Encounter for fitting and adjustment of urinary device: Secondary | ICD-10-CM | POA: Diagnosis not present

## 2013-05-11 DIAGNOSIS — M6281 Muscle weakness (generalized): Secondary | ICD-10-CM | POA: Diagnosis not present

## 2013-05-11 DIAGNOSIS — Z466 Encounter for fitting and adjustment of urinary device: Secondary | ICD-10-CM | POA: Diagnosis not present

## 2013-05-11 DIAGNOSIS — R262 Difficulty in walking, not elsewhere classified: Secondary | ICD-10-CM | POA: Diagnosis not present

## 2013-05-11 DIAGNOSIS — R32 Unspecified urinary incontinence: Secondary | ICD-10-CM | POA: Diagnosis not present

## 2013-05-13 DIAGNOSIS — C494 Malignant neoplasm of connective and soft tissue of abdomen: Secondary | ICD-10-CM | POA: Diagnosis not present

## 2013-05-13 DIAGNOSIS — D481 Neoplasm of uncertain behavior of connective and other soft tissue: Secondary | ICD-10-CM | POA: Diagnosis not present

## 2013-05-16 DIAGNOSIS — I10 Essential (primary) hypertension: Secondary | ICD-10-CM | POA: Diagnosis not present

## 2013-05-16 DIAGNOSIS — C499 Malignant neoplasm of connective and soft tissue, unspecified: Secondary | ICD-10-CM | POA: Diagnosis not present

## 2013-05-25 DIAGNOSIS — H16219 Exposure keratoconjunctivitis, unspecified eye: Secondary | ICD-10-CM | POA: Diagnosis not present

## 2013-05-25 DIAGNOSIS — Z961 Presence of intraocular lens: Secondary | ICD-10-CM | POA: Diagnosis not present

## 2013-05-25 DIAGNOSIS — H02839 Dermatochalasis of unspecified eye, unspecified eyelid: Secondary | ICD-10-CM | POA: Diagnosis not present

## 2013-05-25 DIAGNOSIS — G510 Bell's palsy: Secondary | ICD-10-CM | POA: Diagnosis not present

## 2013-05-26 DIAGNOSIS — R32 Unspecified urinary incontinence: Secondary | ICD-10-CM | POA: Diagnosis not present

## 2013-05-26 DIAGNOSIS — Z466 Encounter for fitting and adjustment of urinary device: Secondary | ICD-10-CM | POA: Diagnosis not present

## 2013-05-26 DIAGNOSIS — R262 Difficulty in walking, not elsewhere classified: Secondary | ICD-10-CM | POA: Diagnosis not present

## 2013-05-26 DIAGNOSIS — M6281 Muscle weakness (generalized): Secondary | ICD-10-CM | POA: Diagnosis not present

## 2013-05-28 DIAGNOSIS — M542 Cervicalgia: Secondary | ICD-10-CM | POA: Diagnosis not present

## 2013-05-28 DIAGNOSIS — M5020 Other cervical disc displacement, unspecified cervical region: Secondary | ICD-10-CM | POA: Diagnosis not present

## 2013-05-28 DIAGNOSIS — M9981 Other biomechanical lesions of cervical region: Secondary | ICD-10-CM | POA: Diagnosis not present

## 2013-05-28 DIAGNOSIS — M5030 Other cervical disc degeneration, unspecified cervical region: Secondary | ICD-10-CM | POA: Diagnosis not present

## 2013-05-30 DIAGNOSIS — M5030 Other cervical disc degeneration, unspecified cervical region: Secondary | ICD-10-CM | POA: Diagnosis not present

## 2013-05-30 DIAGNOSIS — M9981 Other biomechanical lesions of cervical region: Secondary | ICD-10-CM | POA: Diagnosis not present

## 2013-05-30 DIAGNOSIS — M542 Cervicalgia: Secondary | ICD-10-CM | POA: Diagnosis not present

## 2013-05-30 DIAGNOSIS — M5020 Other cervical disc displacement, unspecified cervical region: Secondary | ICD-10-CM | POA: Diagnosis not present

## 2013-05-31 DIAGNOSIS — M9981 Other biomechanical lesions of cervical region: Secondary | ICD-10-CM | POA: Diagnosis not present

## 2013-05-31 DIAGNOSIS — M542 Cervicalgia: Secondary | ICD-10-CM | POA: Diagnosis not present

## 2013-05-31 DIAGNOSIS — M5020 Other cervical disc displacement, unspecified cervical region: Secondary | ICD-10-CM | POA: Diagnosis not present

## 2013-05-31 DIAGNOSIS — M5030 Other cervical disc degeneration, unspecified cervical region: Secondary | ICD-10-CM | POA: Diagnosis not present

## 2013-06-01 DIAGNOSIS — M5030 Other cervical disc degeneration, unspecified cervical region: Secondary | ICD-10-CM | POA: Diagnosis not present

## 2013-06-01 DIAGNOSIS — R262 Difficulty in walking, not elsewhere classified: Secondary | ICD-10-CM | POA: Diagnosis not present

## 2013-06-01 DIAGNOSIS — M542 Cervicalgia: Secondary | ICD-10-CM | POA: Diagnosis not present

## 2013-06-01 DIAGNOSIS — Z466 Encounter for fitting and adjustment of urinary device: Secondary | ICD-10-CM | POA: Diagnosis not present

## 2013-06-01 DIAGNOSIS — M5020 Other cervical disc displacement, unspecified cervical region: Secondary | ICD-10-CM | POA: Diagnosis not present

## 2013-06-01 DIAGNOSIS — M9981 Other biomechanical lesions of cervical region: Secondary | ICD-10-CM | POA: Diagnosis not present

## 2013-06-01 DIAGNOSIS — R32 Unspecified urinary incontinence: Secondary | ICD-10-CM | POA: Diagnosis not present

## 2013-06-01 DIAGNOSIS — M6281 Muscle weakness (generalized): Secondary | ICD-10-CM | POA: Diagnosis not present

## 2013-06-03 DIAGNOSIS — Z466 Encounter for fitting and adjustment of urinary device: Secondary | ICD-10-CM | POA: Diagnosis not present

## 2013-06-03 DIAGNOSIS — R262 Difficulty in walking, not elsewhere classified: Secondary | ICD-10-CM | POA: Diagnosis not present

## 2013-06-03 DIAGNOSIS — M6281 Muscle weakness (generalized): Secondary | ICD-10-CM | POA: Diagnosis not present

## 2013-06-03 DIAGNOSIS — R32 Unspecified urinary incontinence: Secondary | ICD-10-CM | POA: Diagnosis not present

## 2013-06-07 DIAGNOSIS — M6281 Muscle weakness (generalized): Secondary | ICD-10-CM | POA: Diagnosis not present

## 2013-06-07 DIAGNOSIS — R262 Difficulty in walking, not elsewhere classified: Secondary | ICD-10-CM | POA: Diagnosis not present

## 2013-06-07 DIAGNOSIS — R32 Unspecified urinary incontinence: Secondary | ICD-10-CM | POA: Diagnosis not present

## 2013-06-07 DIAGNOSIS — Z466 Encounter for fitting and adjustment of urinary device: Secondary | ICD-10-CM | POA: Diagnosis not present

## 2013-06-10 DIAGNOSIS — M6281 Muscle weakness (generalized): Secondary | ICD-10-CM | POA: Diagnosis not present

## 2013-06-10 DIAGNOSIS — Z466 Encounter for fitting and adjustment of urinary device: Secondary | ICD-10-CM | POA: Diagnosis not present

## 2013-06-10 DIAGNOSIS — R262 Difficulty in walking, not elsewhere classified: Secondary | ICD-10-CM | POA: Diagnosis not present

## 2013-06-10 DIAGNOSIS — R32 Unspecified urinary incontinence: Secondary | ICD-10-CM | POA: Diagnosis not present

## 2013-06-23 DIAGNOSIS — M6281 Muscle weakness (generalized): Secondary | ICD-10-CM | POA: Diagnosis not present

## 2013-06-23 DIAGNOSIS — R32 Unspecified urinary incontinence: Secondary | ICD-10-CM | POA: Diagnosis not present

## 2013-06-23 DIAGNOSIS — Z466 Encounter for fitting and adjustment of urinary device: Secondary | ICD-10-CM | POA: Diagnosis not present

## 2013-06-23 DIAGNOSIS — R262 Difficulty in walking, not elsewhere classified: Secondary | ICD-10-CM | POA: Diagnosis not present

## 2013-06-25 DIAGNOSIS — Z466 Encounter for fitting and adjustment of urinary device: Secondary | ICD-10-CM | POA: Diagnosis not present

## 2013-06-25 DIAGNOSIS — R32 Unspecified urinary incontinence: Secondary | ICD-10-CM | POA: Diagnosis not present

## 2013-06-25 DIAGNOSIS — M6281 Muscle weakness (generalized): Secondary | ICD-10-CM | POA: Diagnosis not present

## 2013-07-22 DIAGNOSIS — R32 Unspecified urinary incontinence: Secondary | ICD-10-CM | POA: Diagnosis not present

## 2013-07-22 DIAGNOSIS — Z466 Encounter for fitting and adjustment of urinary device: Secondary | ICD-10-CM | POA: Diagnosis not present

## 2013-07-22 DIAGNOSIS — M6281 Muscle weakness (generalized): Secondary | ICD-10-CM | POA: Diagnosis not present

## 2013-07-27 DIAGNOSIS — R32 Unspecified urinary incontinence: Secondary | ICD-10-CM | POA: Diagnosis not present

## 2013-07-27 DIAGNOSIS — Z466 Encounter for fitting and adjustment of urinary device: Secondary | ICD-10-CM | POA: Diagnosis not present

## 2013-07-27 DIAGNOSIS — M6281 Muscle weakness (generalized): Secondary | ICD-10-CM | POA: Diagnosis not present

## 2013-08-16 DIAGNOSIS — M6281 Muscle weakness (generalized): Secondary | ICD-10-CM | POA: Diagnosis not present

## 2013-08-16 DIAGNOSIS — R32 Unspecified urinary incontinence: Secondary | ICD-10-CM | POA: Diagnosis not present

## 2013-08-16 DIAGNOSIS — Z466 Encounter for fitting and adjustment of urinary device: Secondary | ICD-10-CM | POA: Diagnosis not present

## 2013-08-20 DIAGNOSIS — M6281 Muscle weakness (generalized): Secondary | ICD-10-CM | POA: Diagnosis not present

## 2013-08-20 DIAGNOSIS — R32 Unspecified urinary incontinence: Secondary | ICD-10-CM | POA: Diagnosis not present

## 2013-08-20 DIAGNOSIS — Z466 Encounter for fitting and adjustment of urinary device: Secondary | ICD-10-CM | POA: Diagnosis not present

## 2013-08-20 DIAGNOSIS — N390 Urinary tract infection, site not specified: Secondary | ICD-10-CM | POA: Diagnosis not present

## 2013-08-24 DIAGNOSIS — M6281 Muscle weakness (generalized): Secondary | ICD-10-CM | POA: Diagnosis not present

## 2013-08-24 DIAGNOSIS — Z466 Encounter for fitting and adjustment of urinary device: Secondary | ICD-10-CM | POA: Diagnosis not present

## 2013-08-24 DIAGNOSIS — R32 Unspecified urinary incontinence: Secondary | ICD-10-CM | POA: Diagnosis not present

## 2013-08-25 DIAGNOSIS — Z466 Encounter for fitting and adjustment of urinary device: Secondary | ICD-10-CM | POA: Diagnosis not present

## 2013-08-25 DIAGNOSIS — M6281 Muscle weakness (generalized): Secondary | ICD-10-CM | POA: Diagnosis not present

## 2013-08-25 DIAGNOSIS — R32 Unspecified urinary incontinence: Secondary | ICD-10-CM | POA: Diagnosis not present

## 2013-09-02 DIAGNOSIS — R32 Unspecified urinary incontinence: Secondary | ICD-10-CM | POA: Diagnosis not present

## 2013-09-02 DIAGNOSIS — M6281 Muscle weakness (generalized): Secondary | ICD-10-CM | POA: Diagnosis not present

## 2013-09-02 DIAGNOSIS — Z466 Encounter for fitting and adjustment of urinary device: Secondary | ICD-10-CM | POA: Diagnosis not present

## 2013-09-07 DIAGNOSIS — Z466 Encounter for fitting and adjustment of urinary device: Secondary | ICD-10-CM | POA: Diagnosis not present

## 2013-09-07 DIAGNOSIS — M6281 Muscle weakness (generalized): Secondary | ICD-10-CM | POA: Diagnosis not present

## 2013-09-07 DIAGNOSIS — R32 Unspecified urinary incontinence: Secondary | ICD-10-CM | POA: Diagnosis not present

## 2013-09-13 DIAGNOSIS — R498 Other voice and resonance disorders: Secondary | ICD-10-CM | POA: Diagnosis not present

## 2013-09-13 DIAGNOSIS — C494 Malignant neoplasm of connective and soft tissue of abdomen: Secondary | ICD-10-CM | POA: Diagnosis not present

## 2013-09-13 DIAGNOSIS — C179 Malignant neoplasm of small intestine, unspecified: Secondary | ICD-10-CM | POA: Diagnosis not present

## 2013-09-20 DIAGNOSIS — R32 Unspecified urinary incontinence: Secondary | ICD-10-CM | POA: Diagnosis not present

## 2013-09-20 DIAGNOSIS — Z466 Encounter for fitting and adjustment of urinary device: Secondary | ICD-10-CM | POA: Diagnosis not present

## 2013-09-20 DIAGNOSIS — M6281 Muscle weakness (generalized): Secondary | ICD-10-CM | POA: Diagnosis not present

## 2013-09-23 DIAGNOSIS — I10 Essential (primary) hypertension: Secondary | ICD-10-CM | POA: Diagnosis not present

## 2013-10-21 DIAGNOSIS — Z466 Encounter for fitting and adjustment of urinary device: Secondary | ICD-10-CM | POA: Diagnosis not present

## 2013-10-21 DIAGNOSIS — R32 Unspecified urinary incontinence: Secondary | ICD-10-CM | POA: Diagnosis not present

## 2013-10-21 DIAGNOSIS — M6281 Muscle weakness (generalized): Secondary | ICD-10-CM | POA: Diagnosis not present

## 2013-10-23 DIAGNOSIS — N319 Neuromuscular dysfunction of bladder, unspecified: Secondary | ICD-10-CM | POA: Diagnosis not present

## 2013-10-23 DIAGNOSIS — R32 Unspecified urinary incontinence: Secondary | ICD-10-CM | POA: Diagnosis not present

## 2013-10-23 DIAGNOSIS — M6281 Muscle weakness (generalized): Secondary | ICD-10-CM | POA: Diagnosis not present

## 2013-10-23 DIAGNOSIS — Z466 Encounter for fitting and adjustment of urinary device: Secondary | ICD-10-CM | POA: Diagnosis not present

## 2013-11-09 DIAGNOSIS — H6500 Acute serous otitis media, unspecified ear: Secondary | ICD-10-CM | POA: Diagnosis not present

## 2013-11-09 DIAGNOSIS — H902 Conductive hearing loss, unspecified: Secondary | ICD-10-CM | POA: Diagnosis not present

## 2013-11-23 DIAGNOSIS — G510 Bell's palsy: Secondary | ICD-10-CM | POA: Diagnosis not present

## 2013-11-23 DIAGNOSIS — Z961 Presence of intraocular lens: Secondary | ICD-10-CM | POA: Diagnosis not present

## 2013-11-23 DIAGNOSIS — H16219 Exposure keratoconjunctivitis, unspecified eye: Secondary | ICD-10-CM | POA: Diagnosis not present

## 2013-12-08 DIAGNOSIS — H902 Conductive hearing loss, unspecified: Secondary | ICD-10-CM | POA: Diagnosis not present

## 2013-12-08 DIAGNOSIS — H6520 Chronic serous otitis media, unspecified ear: Secondary | ICD-10-CM | POA: Diagnosis not present

## 2013-12-08 DIAGNOSIS — H6500 Acute serous otitis media, unspecified ear: Secondary | ICD-10-CM | POA: Diagnosis not present

## 2013-12-22 DIAGNOSIS — R32 Unspecified urinary incontinence: Secondary | ICD-10-CM | POA: Diagnosis not present

## 2013-12-22 DIAGNOSIS — Z466 Encounter for fitting and adjustment of urinary device: Secondary | ICD-10-CM | POA: Diagnosis not present

## 2013-12-22 DIAGNOSIS — M6281 Muscle weakness (generalized): Secondary | ICD-10-CM | POA: Diagnosis not present

## 2013-12-22 DIAGNOSIS — N319 Neuromuscular dysfunction of bladder, unspecified: Secondary | ICD-10-CM | POA: Diagnosis not present

## 2014-01-06 DIAGNOSIS — M6281 Muscle weakness (generalized): Secondary | ICD-10-CM | POA: Diagnosis not present

## 2014-01-06 DIAGNOSIS — Z466 Encounter for fitting and adjustment of urinary device: Secondary | ICD-10-CM | POA: Diagnosis not present

## 2014-01-06 DIAGNOSIS — N319 Neuromuscular dysfunction of bladder, unspecified: Secondary | ICD-10-CM | POA: Diagnosis not present

## 2014-01-06 DIAGNOSIS — R32 Unspecified urinary incontinence: Secondary | ICD-10-CM | POA: Diagnosis not present

## 2014-01-10 DIAGNOSIS — Z9010 Acquired absence of unspecified breast and nipple: Secondary | ICD-10-CM | POA: Diagnosis not present

## 2014-01-10 DIAGNOSIS — C179 Malignant neoplasm of small intestine, unspecified: Secondary | ICD-10-CM | POA: Diagnosis not present

## 2014-01-10 DIAGNOSIS — C494 Malignant neoplasm of connective and soft tissue of abdomen: Secondary | ICD-10-CM | POA: Diagnosis not present

## 2014-01-10 DIAGNOSIS — R498 Other voice and resonance disorders: Secondary | ICD-10-CM | POA: Diagnosis not present

## 2014-01-10 DIAGNOSIS — I77810 Thoracic aortic ectasia: Secondary | ICD-10-CM | POA: Diagnosis not present

## 2014-01-10 DIAGNOSIS — I10 Essential (primary) hypertension: Secondary | ICD-10-CM | POA: Diagnosis not present

## 2014-01-10 DIAGNOSIS — Z853 Personal history of malignant neoplasm of breast: Secondary | ICD-10-CM | POA: Diagnosis not present

## 2014-01-17 ENCOUNTER — Ambulatory Visit (INDEPENDENT_AMBULATORY_CARE_PROVIDER_SITE_OTHER): Payer: Medicare Other | Admitting: Emergency Medicine

## 2014-01-17 VITALS — BP 114/62 | HR 65 | Temp 98.8°F | Resp 18

## 2014-01-17 DIAGNOSIS — J209 Acute bronchitis, unspecified: Secondary | ICD-10-CM | POA: Diagnosis not present

## 2014-01-17 MED ORDER — IPRATROPIUM BROMIDE 0.02 % IN SOLN
0.5000 mg | Freq: Once | RESPIRATORY_TRACT | Status: AC
  Administered 2014-01-17: 0.5 mg via RESPIRATORY_TRACT

## 2014-01-17 MED ORDER — ALBUTEROL SULFATE (2.5 MG/3ML) 0.083% IN NEBU
2.5000 mg | INHALATION_SOLUTION | Freq: Once | RESPIRATORY_TRACT | Status: AC
  Administered 2014-01-17: 2.5 mg via RESPIRATORY_TRACT

## 2014-01-17 MED ORDER — ALBUTEROL SULFATE HFA 108 (90 BASE) MCG/ACT IN AERS: 2.0000 | INHALATION_SPRAY | RESPIRATORY_TRACT | Status: DC | PRN

## 2014-01-17 MED ORDER — SPACER/AERO-HOLDING CHAMBERS DEVI: Status: AC

## 2014-01-17 NOTE — Patient Instructions (Signed)
Metered Dose Inhaler with Spacer Inhaled medicines are the basis of treatment of asthma and other breathing problems. Inhaled medicine can only be effective if used properly. Good technique assures that the medicine reaches the lungs. Your health care provider has asked you to use a spacer with your inhaler to help you take the medicine more effectively. A spacer is a plastic tube with a mouthpiece on one end and an opening that connects to the inhaler on the other end. Metered dose inhalers (MDIs) are used to deliver a variety of inhaled medicines. These include quick relief or rescue medicines (such as bronchodilators) and controller medicines (such as corticosteroids). The medicine is delivered by pushing down on a metal canister to release a set amount of spray. If you are using different kinds of inhalers, use your quick relief medicine to open the airways 10 15 minutes before using a steroid if instructed to do so by your health care provider. If you are unsure which inhalers to use and the order of using them, ask your health care provider, nurse, or respiratory therapist. HOW TO USE THE INHALER WITH A SPACER 1. Remove cap from inhaler. 2. If you are using the inhaler for the first time, you will need to prime it. Shake the inhaler for 5 seconds and release four puffs into the air, away from your face. Ask your health care provider or pharmacist if you have questions about priming your inhaler. 3. Shake inhaler for 5 seconds before each breath in (inhalation). 4. Place the open end of the spacer onto the mouthpiece of the inhaler. 5. Position the inhaler so that the top of the canister faces up and the spacer mouthpiece faces you. 6. Put your index finger on the top of the medicine canister. Your thumb supports the bottom of the inhaler and the spacer. 7. Breathe out (exhale) normally and as completely as possible. 8. Immediately after exhaling, place the spacer between your teeth and into your  mouth. Close your mouth tightly around the spacer. 9. Press the canister down with the index finger to release the medicine. 10. At the same time as the canister is pressed, inhale deeply and slowly until the lungs are completely filled. This should take 4 6 seconds. Keep your tongue down and out of the way. 11. Hold the medicine in your lungs for 5 10 seconds (10 seconds is best). This helps the medicine get into the small airways of your lungs. Exhale. 12. Repeat inhaling deeply through the spacer mouthpiece. Again hold that breath for up to 10 seconds (10 seconds is best). Exhale slowly. If it is difficult to take this second deep breath through the spacer, breathe normally several times through the spacer. Remove the spacer from your mouth. 13. Wait at least 15 30 seconds between puffs. Continue with the above steps until you have taken the number of puffs your health care provider has ordered. Do not use the inhaler more than your health care provider directs you to. 14. Remove spacer from the inhaler and place cap on inhaler. 15. Follow the directions from your health care provider or the inhaler insert for cleaning the inhaler and spacer. If you are using a steroid inhaler, rinse your mouth with water after your last puff, gargle, and spit out the water. Do not swallow the water. AVOID:  Inhaling before or after starting the spray of medicine. It takes practice to coordinate your breathing with triggering the spray.  Inhaling through the nose (rather than  the spray of medicine. It takes practice to coordinate your breathing with triggering the spray.  · Inhaling through the nose (rather than the mouth) when triggering the spray.  HOW TO DETERMINE IF YOUR INHALER IS FULL OR NEARLY EMPTY  You cannot know when an inhaler is empty by shaking it. A few inhalers are now being made with dose counters. Ask your health care provider for a prescription that has a dose counter if you feel you need that extra help. If your inhaler does not have a counter, ask your health care provider to help you determine the date you need to refill your  inhaler. Write the refill date on a calendar or your inhaler canister. Refill your inhaler 7 10 days before it runs out. Be sure to keep an adequate supply of medicine. This includes making sure it is not expired, and you have a spare inhaler.   SEEK MEDICAL CARE IF:   · Symptoms are only partially relieved with your inhaler.  · You are having trouble using your inhaler.  · You experience some increase in phlegm.  SEEK IMMEDIATE MEDICAL CARE IF:   · You feel little or no relief with your inhalers. You are still wheezing and are feeling shortness of breath or tightness in your chest or both.  · You have dizziness, headaches, or fast heart rate.  · You have chills, fever, or night sweats.  · There is a noticeable increase in phlegm production, or there is blood in the phlegm.  Document Released: 10/20/2005 Document Revised: 08/10/2013 Document Reviewed: 04/07/2013  Exit[employer]® Patient Information ©2014 [employer].

## 2014-01-17 NOTE — Progress Notes (Signed)
[facility] [address] [place] [address] [phone]  Date:  01/17/2014   Name:  Melanie Trevino   DOB:  02/24/1928   MRN:  [id number]  PCP:  Horton Finer, MD    Chief Complaint: Cough, wheezing and Hoarse   History of Present Illness:  Melanie Trevino is a 80 y.o. very pleasant female patient who presents with the following:  Ill past several days with a cough productive of scant sputum no idea what color was.  Has no nasal congestion or drainage.  No fever or chills. Has a sore throat and hoarseness.  Some wheezing. No fever or chills. No nausea or vomiting.  No improvement with over the counter medications or other home remedies. Denies other complaint or health concern today.   Patient Active Problem List   Diagnosis Date Noted  . Venous stasis ulcer 08/18/2012  . Liver cancer 08/18/2012  . Breast cancer 08/18/2012    Past Medical History  Diagnosis Date  . Hypertension     History reviewed. No pertinent past surgical history.  History  Substance Use Topics  . Smoking status: Never Smoker   . Smokeless tobacco: Not on file  . Alcohol Use: Not on file    History reviewed. No pertinent family history.  Allergies  Allergen Reactions  . Shellfish Allergy     Medication list has been reviewed and updated.  Current Outpatient Prescriptions on File Prior to Visit  Medication Sig Dispense Refill  . fluticasone (FLONASE) 50 MCG/ACT nasal spray       . imatinib (GLEEVEC) 100 MG tablet Take 100 mg by mouth daily. Take with meals and large glass of water.Caution:Chemotherapy      . losartan (COZAAR) 50 MG tablet Take 50 mg by mouth daily.      . methocarbamol (ROBAXIN) 500 MG tablet Take 500 mg by mouth 4 (four) times daily.      . potassium chloride SA (K-DUR,KLOR-CON) 20 MEQ tablet       . sulfamethoxazole-trimethoprim (BACTRIM DS) 800-160 MG per tablet Take 1 tablet by mouth 2 (two) times daily.  20 tablet  0  . traMADol (ULTRAM) 50  MG tablet Take 50 mg by mouth every 6 (six) hours as needed.       No current facility-administered medications on file prior to visit.    Review of Systems:  As per HPI, otherwise negative.    Physical Examination: Filed Vitals:   01/17/14 0952  BP: 114/62  Pulse: 65  Temp: 98.8 F (37.1 C)  Resp: 18   Filed Vitals:   Cannot calculate BMI with a height equal to zero. Ideal Body Weight:    GEN  Obese, NAD, Non-toxic, A & O x 3 HEENT: Atraumatic, Normocephalic. Neck supple. No masses, No LAD. Ears and Nose: No external deformity. CV: RRR, No M/G/R. No JVD. No thrill. No extra heart sounds. PULM: CTA B, diffuse wheezes, no crackles, rhonchi. No retractions. No resp. distress. No accessory muscle use. ABD: S, NT, ND, +BS. No rebound. No HSM. EXTR: No c/c/e NEURO wheelchair bound  PSYCH: Normally interactive. Conversant. Not depressed or anxious appearing.  Calm demeanor.    Assessment and Plan: Bronchitis with bronchospasm   Signed,  Ellison Carwin, MD

## 2014-01-18 DIAGNOSIS — M6281 Muscle weakness (generalized): Secondary | ICD-10-CM | POA: Diagnosis not present

## 2014-01-18 DIAGNOSIS — R32 Unspecified urinary incontinence: Secondary | ICD-10-CM | POA: Diagnosis not present

## 2014-01-18 DIAGNOSIS — Z466 Encounter for fitting and adjustment of urinary device: Secondary | ICD-10-CM | POA: Diagnosis not present

## 2014-01-18 DIAGNOSIS — N319 Neuromuscular dysfunction of bladder, unspecified: Secondary | ICD-10-CM | POA: Diagnosis not present

## 2014-02-06 DIAGNOSIS — M6281 Muscle weakness (generalized): Secondary | ICD-10-CM | POA: Diagnosis not present

## 2014-02-06 DIAGNOSIS — N319 Neuromuscular dysfunction of bladder, unspecified: Secondary | ICD-10-CM | POA: Diagnosis not present

## 2014-02-06 DIAGNOSIS — R32 Unspecified urinary incontinence: Secondary | ICD-10-CM | POA: Diagnosis not present

## 2014-02-06 DIAGNOSIS — Z466 Encounter for fitting and adjustment of urinary device: Secondary | ICD-10-CM | POA: Diagnosis not present

## 2014-02-10 DIAGNOSIS — Z466 Encounter for fitting and adjustment of urinary device: Secondary | ICD-10-CM | POA: Diagnosis not present

## 2014-02-10 DIAGNOSIS — M6281 Muscle weakness (generalized): Secondary | ICD-10-CM | POA: Diagnosis not present

## 2014-02-10 DIAGNOSIS — R32 Unspecified urinary incontinence: Secondary | ICD-10-CM | POA: Diagnosis not present

## 2014-02-10 DIAGNOSIS — N319 Neuromuscular dysfunction of bladder, unspecified: Secondary | ICD-10-CM | POA: Diagnosis not present

## 2014-02-17 DIAGNOSIS — Z466 Encounter for fitting and adjustment of urinary device: Secondary | ICD-10-CM | POA: Diagnosis not present

## 2014-02-17 DIAGNOSIS — M6281 Muscle weakness (generalized): Secondary | ICD-10-CM | POA: Diagnosis not present

## 2014-02-17 DIAGNOSIS — R32 Unspecified urinary incontinence: Secondary | ICD-10-CM | POA: Diagnosis not present

## 2014-02-17 DIAGNOSIS — N319 Neuromuscular dysfunction of bladder, unspecified: Secondary | ICD-10-CM | POA: Diagnosis not present

## 2014-02-20 DIAGNOSIS — N319 Neuromuscular dysfunction of bladder, unspecified: Secondary | ICD-10-CM | POA: Diagnosis not present

## 2014-02-20 DIAGNOSIS — Z466 Encounter for fitting and adjustment of urinary device: Secondary | ICD-10-CM | POA: Diagnosis not present

## 2014-02-20 DIAGNOSIS — R32 Unspecified urinary incontinence: Secondary | ICD-10-CM | POA: Diagnosis not present

## 2014-02-20 DIAGNOSIS — M6281 Muscle weakness (generalized): Secondary | ICD-10-CM | POA: Diagnosis not present

## 2014-02-28 DIAGNOSIS — M6281 Muscle weakness (generalized): Secondary | ICD-10-CM | POA: Diagnosis not present

## 2014-02-28 DIAGNOSIS — R32 Unspecified urinary incontinence: Secondary | ICD-10-CM | POA: Diagnosis not present

## 2014-02-28 DIAGNOSIS — Z466 Encounter for fitting and adjustment of urinary device: Secondary | ICD-10-CM | POA: Diagnosis not present

## 2014-02-28 DIAGNOSIS — N319 Neuromuscular dysfunction of bladder, unspecified: Secondary | ICD-10-CM | POA: Diagnosis not present

## 2014-03-14 DIAGNOSIS — M6281 Muscle weakness (generalized): Secondary | ICD-10-CM | POA: Diagnosis not present

## 2014-03-14 DIAGNOSIS — Z466 Encounter for fitting and adjustment of urinary device: Secondary | ICD-10-CM | POA: Diagnosis not present

## 2014-03-14 DIAGNOSIS — R32 Unspecified urinary incontinence: Secondary | ICD-10-CM | POA: Diagnosis not present

## 2014-03-14 DIAGNOSIS — N319 Neuromuscular dysfunction of bladder, unspecified: Secondary | ICD-10-CM | POA: Diagnosis not present

## 2014-03-16 DIAGNOSIS — R413 Other amnesia: Secondary | ICD-10-CM | POA: Diagnosis not present

## 2014-03-16 DIAGNOSIS — I10 Essential (primary) hypertension: Secondary | ICD-10-CM | POA: Diagnosis not present

## 2014-03-16 DIAGNOSIS — D649 Anemia, unspecified: Secondary | ICD-10-CM | POA: Diagnosis not present

## 2014-04-18 DIAGNOSIS — M6281 Muscle weakness (generalized): Secondary | ICD-10-CM | POA: Diagnosis not present

## 2014-04-18 DIAGNOSIS — Z466 Encounter for fitting and adjustment of urinary device: Secondary | ICD-10-CM | POA: Diagnosis not present

## 2014-04-18 DIAGNOSIS — N319 Neuromuscular dysfunction of bladder, unspecified: Secondary | ICD-10-CM | POA: Diagnosis not present

## 2014-04-18 DIAGNOSIS — R32 Unspecified urinary incontinence: Secondary | ICD-10-CM | POA: Diagnosis not present

## 2014-04-21 DIAGNOSIS — Z466 Encounter for fitting and adjustment of urinary device: Secondary | ICD-10-CM | POA: Diagnosis not present

## 2014-04-21 DIAGNOSIS — N319 Neuromuscular dysfunction of bladder, unspecified: Secondary | ICD-10-CM | POA: Diagnosis not present

## 2014-04-21 DIAGNOSIS — I10 Essential (primary) hypertension: Secondary | ICD-10-CM | POA: Diagnosis not present

## 2014-04-21 DIAGNOSIS — R32 Unspecified urinary incontinence: Secondary | ICD-10-CM | POA: Diagnosis not present

## 2014-04-21 DIAGNOSIS — M6281 Muscle weakness (generalized): Secondary | ICD-10-CM | POA: Diagnosis not present

## 2014-04-27 DIAGNOSIS — N390 Urinary tract infection, site not specified: Secondary | ICD-10-CM | POA: Diagnosis not present

## 2014-05-22 DIAGNOSIS — D649 Anemia, unspecified: Secondary | ICD-10-CM | POA: Diagnosis not present

## 2014-05-23 DIAGNOSIS — C494 Malignant neoplasm of connective and soft tissue of abdomen: Secondary | ICD-10-CM | POA: Diagnosis not present

## 2014-05-23 DIAGNOSIS — L299 Pruritus, unspecified: Secondary | ICD-10-CM | POA: Diagnosis not present

## 2014-05-23 DIAGNOSIS — R413 Other amnesia: Secondary | ICD-10-CM | POA: Diagnosis not present

## 2014-05-23 DIAGNOSIS — N816 Rectocele: Secondary | ICD-10-CM | POA: Diagnosis not present

## 2014-05-26 DIAGNOSIS — H16219 Exposure keratoconjunctivitis, unspecified eye: Secondary | ICD-10-CM | POA: Diagnosis not present

## 2014-05-26 DIAGNOSIS — G510 Bell's palsy: Secondary | ICD-10-CM | POA: Diagnosis not present

## 2014-05-26 DIAGNOSIS — Z961 Presence of intraocular lens: Secondary | ICD-10-CM | POA: Diagnosis not present

## 2014-06-07 DIAGNOSIS — L299 Pruritus, unspecified: Secondary | ICD-10-CM | POA: Diagnosis not present

## 2014-06-07 DIAGNOSIS — I10 Essential (primary) hypertension: Secondary | ICD-10-CM | POA: Diagnosis not present

## 2014-06-07 DIAGNOSIS — I872 Venous insufficiency (chronic) (peripheral): Secondary | ICD-10-CM | POA: Diagnosis not present

## 2014-06-07 DIAGNOSIS — I8310 Varicose veins of unspecified lower extremity with inflammation: Secondary | ICD-10-CM | POA: Diagnosis not present

## 2014-06-20 DIAGNOSIS — R32 Unspecified urinary incontinence: Secondary | ICD-10-CM | POA: Diagnosis not present

## 2014-06-20 DIAGNOSIS — N319 Neuromuscular dysfunction of bladder, unspecified: Secondary | ICD-10-CM | POA: Diagnosis not present

## 2014-06-20 DIAGNOSIS — Z466 Encounter for fitting and adjustment of urinary device: Secondary | ICD-10-CM | POA: Diagnosis not present

## 2014-06-20 DIAGNOSIS — I10 Essential (primary) hypertension: Secondary | ICD-10-CM | POA: Diagnosis not present

## 2014-06-28 DIAGNOSIS — R32 Unspecified urinary incontinence: Secondary | ICD-10-CM | POA: Diagnosis not present

## 2014-06-28 DIAGNOSIS — R339 Retention of urine, unspecified: Secondary | ICD-10-CM | POA: Diagnosis not present

## 2014-07-06 DIAGNOSIS — G510 Bell's palsy: Secondary | ICD-10-CM | POA: Diagnosis not present

## 2014-07-06 DIAGNOSIS — G589 Mononeuropathy, unspecified: Secondary | ICD-10-CM | POA: Diagnosis not present

## 2014-07-06 DIAGNOSIS — R413 Other amnesia: Secondary | ICD-10-CM | POA: Diagnosis not present

## 2014-07-06 DIAGNOSIS — R51 Headache: Secondary | ICD-10-CM | POA: Diagnosis not present

## 2014-07-06 DIAGNOSIS — R209 Unspecified disturbances of skin sensation: Secondary | ICD-10-CM | POA: Diagnosis not present

## 2014-07-06 DIAGNOSIS — C494 Malignant neoplasm of connective and soft tissue of abdomen: Secondary | ICD-10-CM | POA: Diagnosis not present

## 2014-07-07 DIAGNOSIS — R209 Unspecified disturbances of skin sensation: Secondary | ICD-10-CM | POA: Diagnosis not present

## 2014-07-07 DIAGNOSIS — G510 Bell's palsy: Secondary | ICD-10-CM | POA: Diagnosis not present

## 2014-07-07 DIAGNOSIS — R51 Headache: Secondary | ICD-10-CM | POA: Diagnosis not present

## 2014-07-07 DIAGNOSIS — R413 Other amnesia: Secondary | ICD-10-CM | POA: Diagnosis not present

## 2014-08-04 DIAGNOSIS — R29810 Facial weakness: Secondary | ICD-10-CM | POA: Diagnosis not present

## 2014-08-04 DIAGNOSIS — R202 Paresthesia of skin: Secondary | ICD-10-CM | POA: Diagnosis not present

## 2014-08-04 DIAGNOSIS — D333 Benign neoplasm of cranial nerves: Secondary | ICD-10-CM | POA: Diagnosis not present

## 2014-08-04 DIAGNOSIS — R51 Headache: Secondary | ICD-10-CM | POA: Diagnosis not present

## 2014-08-04 DIAGNOSIS — G629 Polyneuropathy, unspecified: Secondary | ICD-10-CM | POA: Diagnosis not present

## 2014-08-04 DIAGNOSIS — R208 Other disturbances of skin sensation: Secondary | ICD-10-CM | POA: Diagnosis not present

## 2014-08-04 DIAGNOSIS — G510 Bell's palsy: Secondary | ICD-10-CM | POA: Diagnosis not present

## 2014-08-04 DIAGNOSIS — R413 Other amnesia: Secondary | ICD-10-CM | POA: Diagnosis not present

## 2014-08-19 DIAGNOSIS — I10 Essential (primary) hypertension: Secondary | ICD-10-CM | POA: Diagnosis not present

## 2014-08-19 DIAGNOSIS — N319 Neuromuscular dysfunction of bladder, unspecified: Secondary | ICD-10-CM | POA: Diagnosis not present

## 2014-08-19 DIAGNOSIS — Z466 Encounter for fitting and adjustment of urinary device: Secondary | ICD-10-CM | POA: Diagnosis not present

## 2014-08-19 DIAGNOSIS — R32 Unspecified urinary incontinence: Secondary | ICD-10-CM | POA: Diagnosis not present

## 2014-08-21 DIAGNOSIS — Z466 Encounter for fitting and adjustment of urinary device: Secondary | ICD-10-CM | POA: Diagnosis not present

## 2014-08-21 DIAGNOSIS — R32 Unspecified urinary incontinence: Secondary | ICD-10-CM | POA: Diagnosis not present

## 2014-08-21 DIAGNOSIS — N319 Neuromuscular dysfunction of bladder, unspecified: Secondary | ICD-10-CM | POA: Diagnosis not present

## 2014-08-21 DIAGNOSIS — I10 Essential (primary) hypertension: Secondary | ICD-10-CM | POA: Diagnosis not present

## 2014-08-22 DIAGNOSIS — K439 Ventral hernia without obstruction or gangrene: Secondary | ICD-10-CM | POA: Diagnosis not present

## 2014-08-22 DIAGNOSIS — C499 Malignant neoplasm of connective and soft tissue, unspecified: Secondary | ICD-10-CM | POA: Diagnosis not present

## 2014-08-22 DIAGNOSIS — L299 Pruritus, unspecified: Secondary | ICD-10-CM | POA: Diagnosis not present

## 2014-08-22 DIAGNOSIS — C179 Malignant neoplasm of small intestine, unspecified: Secondary | ICD-10-CM | POA: Diagnosis not present

## 2014-08-22 DIAGNOSIS — Z853 Personal history of malignant neoplasm of breast: Secondary | ICD-10-CM | POA: Diagnosis not present

## 2014-08-22 DIAGNOSIS — R413 Other amnesia: Secondary | ICD-10-CM | POA: Diagnosis not present

## 2014-08-30 DIAGNOSIS — R339 Retention of urine, unspecified: Secondary | ICD-10-CM | POA: Diagnosis not present

## 2014-08-30 DIAGNOSIS — R32 Unspecified urinary incontinence: Secondary | ICD-10-CM | POA: Diagnosis not present

## 2014-08-31 DIAGNOSIS — R32 Unspecified urinary incontinence: Secondary | ICD-10-CM | POA: Diagnosis not present

## 2014-08-31 DIAGNOSIS — I10 Essential (primary) hypertension: Secondary | ICD-10-CM | POA: Diagnosis not present

## 2014-08-31 DIAGNOSIS — N319 Neuromuscular dysfunction of bladder, unspecified: Secondary | ICD-10-CM | POA: Diagnosis not present

## 2014-08-31 DIAGNOSIS — Z466 Encounter for fitting and adjustment of urinary device: Secondary | ICD-10-CM | POA: Diagnosis not present

## 2014-09-06 DIAGNOSIS — R413 Other amnesia: Secondary | ICD-10-CM | POA: Diagnosis not present

## 2014-09-06 DIAGNOSIS — F068 Other specified mental disorders due to known physiological condition: Secondary | ICD-10-CM | POA: Diagnosis not present

## 2014-09-06 DIAGNOSIS — G3184 Mild cognitive impairment, so stated: Secondary | ICD-10-CM | POA: Diagnosis not present

## 2014-09-13 DIAGNOSIS — N183 Chronic kidney disease, stage 3 (moderate): Secondary | ICD-10-CM | POA: Diagnosis not present

## 2014-09-13 DIAGNOSIS — R339 Retention of urine, unspecified: Secondary | ICD-10-CM | POA: Diagnosis not present

## 2014-09-13 DIAGNOSIS — R300 Dysuria: Secondary | ICD-10-CM | POA: Diagnosis not present

## 2014-09-13 DIAGNOSIS — I10 Essential (primary) hypertension: Secondary | ICD-10-CM | POA: Diagnosis not present

## 2014-09-30 DIAGNOSIS — Z466 Encounter for fitting and adjustment of urinary device: Secondary | ICD-10-CM | POA: Diagnosis not present

## 2014-09-30 DIAGNOSIS — R32 Unspecified urinary incontinence: Secondary | ICD-10-CM | POA: Diagnosis not present

## 2014-09-30 DIAGNOSIS — I10 Essential (primary) hypertension: Secondary | ICD-10-CM | POA: Diagnosis not present

## 2014-09-30 DIAGNOSIS — N319 Neuromuscular dysfunction of bladder, unspecified: Secondary | ICD-10-CM | POA: Diagnosis not present

## 2014-10-01 DIAGNOSIS — N319 Neuromuscular dysfunction of bladder, unspecified: Secondary | ICD-10-CM | POA: Diagnosis not present

## 2014-10-01 DIAGNOSIS — Z466 Encounter for fitting and adjustment of urinary device: Secondary | ICD-10-CM | POA: Diagnosis not present

## 2014-10-01 DIAGNOSIS — I10 Essential (primary) hypertension: Secondary | ICD-10-CM | POA: Diagnosis not present

## 2014-10-01 DIAGNOSIS — R32 Unspecified urinary incontinence: Secondary | ICD-10-CM | POA: Diagnosis not present

## 2014-10-06 DIAGNOSIS — N319 Neuromuscular dysfunction of bladder, unspecified: Secondary | ICD-10-CM | POA: Diagnosis not present

## 2014-10-06 DIAGNOSIS — I10 Essential (primary) hypertension: Secondary | ICD-10-CM | POA: Diagnosis not present

## 2014-10-06 DIAGNOSIS — Z466 Encounter for fitting and adjustment of urinary device: Secondary | ICD-10-CM | POA: Diagnosis not present

## 2014-10-06 DIAGNOSIS — R32 Unspecified urinary incontinence: Secondary | ICD-10-CM | POA: Diagnosis not present

## 2014-10-17 DIAGNOSIS — I10 Essential (primary) hypertension: Secondary | ICD-10-CM | POA: Diagnosis not present

## 2014-10-17 DIAGNOSIS — N319 Neuromuscular dysfunction of bladder, unspecified: Secondary | ICD-10-CM | POA: Diagnosis not present

## 2014-10-17 DIAGNOSIS — R32 Unspecified urinary incontinence: Secondary | ICD-10-CM | POA: Diagnosis not present

## 2014-10-17 DIAGNOSIS — Z466 Encounter for fitting and adjustment of urinary device: Secondary | ICD-10-CM | POA: Diagnosis not present

## 2014-10-18 DIAGNOSIS — I10 Essential (primary) hypertension: Secondary | ICD-10-CM | POA: Diagnosis not present

## 2014-10-18 DIAGNOSIS — R32 Unspecified urinary incontinence: Secondary | ICD-10-CM | POA: Diagnosis not present

## 2014-10-18 DIAGNOSIS — Z466 Encounter for fitting and adjustment of urinary device: Secondary | ICD-10-CM | POA: Diagnosis not present

## 2014-10-18 DIAGNOSIS — N319 Neuromuscular dysfunction of bladder, unspecified: Secondary | ICD-10-CM | POA: Diagnosis not present

## 2014-11-04 DIAGNOSIS — Z466 Encounter for fitting and adjustment of urinary device: Secondary | ICD-10-CM | POA: Diagnosis not present

## 2014-11-04 DIAGNOSIS — N319 Neuromuscular dysfunction of bladder, unspecified: Secondary | ICD-10-CM | POA: Diagnosis not present

## 2014-11-04 DIAGNOSIS — I10 Essential (primary) hypertension: Secondary | ICD-10-CM | POA: Diagnosis not present

## 2014-11-04 DIAGNOSIS — R32 Unspecified urinary incontinence: Secondary | ICD-10-CM | POA: Diagnosis not present

## 2014-11-08 DIAGNOSIS — I10 Essential (primary) hypertension: Secondary | ICD-10-CM | POA: Diagnosis not present

## 2014-11-08 DIAGNOSIS — R32 Unspecified urinary incontinence: Secondary | ICD-10-CM | POA: Diagnosis not present

## 2014-11-08 DIAGNOSIS — N319 Neuromuscular dysfunction of bladder, unspecified: Secondary | ICD-10-CM | POA: Diagnosis not present

## 2014-11-08 DIAGNOSIS — Z466 Encounter for fitting and adjustment of urinary device: Secondary | ICD-10-CM | POA: Diagnosis not present

## 2014-11-14 DIAGNOSIS — Z85828 Personal history of other malignant neoplasm of skin: Secondary | ICD-10-CM | POA: Diagnosis not present

## 2014-11-14 DIAGNOSIS — K5900 Constipation, unspecified: Secondary | ICD-10-CM | POA: Diagnosis not present

## 2014-11-14 DIAGNOSIS — C787 Secondary malignant neoplasm of liver and intrahepatic bile duct: Secondary | ICD-10-CM | POA: Diagnosis not present

## 2014-11-14 DIAGNOSIS — K439 Ventral hernia without obstruction or gangrene: Secondary | ICD-10-CM | POA: Diagnosis not present

## 2014-11-14 DIAGNOSIS — N2889 Other specified disorders of kidney and ureter: Secondary | ICD-10-CM | POA: Diagnosis not present

## 2014-11-14 DIAGNOSIS — Z9011 Acquired absence of right breast and nipple: Secondary | ICD-10-CM | POA: Diagnosis not present

## 2014-11-14 DIAGNOSIS — Z853 Personal history of malignant neoplasm of breast: Secondary | ICD-10-CM | POA: Diagnosis not present

## 2014-11-14 DIAGNOSIS — C179 Malignant neoplasm of small intestine, unspecified: Secondary | ICD-10-CM | POA: Diagnosis not present

## 2014-11-14 DIAGNOSIS — Z993 Dependence on wheelchair: Secondary | ICD-10-CM | POA: Diagnosis not present

## 2014-11-14 DIAGNOSIS — R609 Edema, unspecified: Secondary | ICD-10-CM | POA: Diagnosis not present

## 2014-11-14 DIAGNOSIS — C499 Malignant neoplasm of connective and soft tissue, unspecified: Secondary | ICD-10-CM | POA: Diagnosis not present

## 2014-11-27 DIAGNOSIS — Z466 Encounter for fitting and adjustment of urinary device: Secondary | ICD-10-CM | POA: Diagnosis not present

## 2014-11-27 DIAGNOSIS — N319 Neuromuscular dysfunction of bladder, unspecified: Secondary | ICD-10-CM | POA: Diagnosis not present

## 2014-11-27 DIAGNOSIS — I10 Essential (primary) hypertension: Secondary | ICD-10-CM | POA: Diagnosis not present

## 2014-11-27 DIAGNOSIS — R32 Unspecified urinary incontinence: Secondary | ICD-10-CM | POA: Diagnosis not present

## 2014-11-29 DIAGNOSIS — R339 Retention of urine, unspecified: Secondary | ICD-10-CM | POA: Diagnosis not present

## 2014-11-29 DIAGNOSIS — D495 Neoplasm of unspecified behavior of other genitourinary organs: Secondary | ICD-10-CM | POA: Diagnosis not present

## 2014-11-29 DIAGNOSIS — R32 Unspecified urinary incontinence: Secondary | ICD-10-CM | POA: Diagnosis not present

## 2014-11-30 ENCOUNTER — Ambulatory Visit: Payer: Medicare Other | Attending: Internal Medicine | Admitting: Rehabilitative and Restorative Service Providers"

## 2014-11-30 DIAGNOSIS — M6281 Muscle weakness (generalized): Secondary | ICD-10-CM

## 2014-11-30 DIAGNOSIS — M6289 Other specified disorders of muscle: Secondary | ICD-10-CM | POA: Diagnosis not present

## 2014-11-30 NOTE — Therapy (Signed)
[facility] [address] [place], [place], [address] Phone: [phone]   Fax:  [phone]  Physical Therapy Evaluation  Patient Details  Name: Melanie Trevino MRN: [id number] Date of Birth: [birth year] Referring Provider:  Dorian Heckle, MD  Encounter Date: 12-22-2014      PT End of Session - 2014-12-22 1015    Visit Number 1   Number of Visits 1   PT Start Time 0850   PT Stop Time 1000   PT Time Calculation (min) 70 min      Past Medical History  Diagnosis Date  . Hypertension     No past surgical history on file.  There were no vitals taken for this visit.  Visit Diagnosis:  Generalized muscle weakness   The patient underwent a wheelchair evaluation and recommendations made for power wheelchair. Copy of wheelchair letter of medical necessity to follow in epic.        G-Codes - December 22, 2014 1014    Functional Assessment Tool Used wheelchair evaluation   Functional Limitation Mobility: Walking and moving around   Mobility: Walking and Moving Around Current Status [phone]) At least 80 percent but less than 100 percent impaired, limited or restricted   Mobility: Walking and Moving Around Goal Status [phone]) At least 80 percent but less than 100 percent impaired, limited or restricted   Mobility: Walking and Moving Around Discharge Status [phone]) At least 80 percent but less than 100 percent impaired, limited or restricted       Problem List Patient Active Problem List   Diagnosis Date Noted  . Venous stasis ulcer 08/18/2012  . Liver cancer 08/18/2012  . Breast cancer 08/18/2012    Bettie Capistran 12/22/14, 10:15 AM  [facility] [address] [place], [place], [address] Phone: [phone]   Fax:  [phone]

## 2014-12-05 ENCOUNTER — Encounter: Payer: Self-pay | Admitting: Rehabilitative and Restorative Service Providers"

## 2014-12-05 NOTE — Therapy (Signed)
[facility] [address] [place], [place], [address] Phone: [phone]   Fax:  [phone]  Patient Details  Name: Melanie Trevino MRN: [id number] Date of Birth: 04/04/1928 Referring Provider:  No ref. provider found  Encounter Date: 12/05/2014  Mobility/Seating Evaluation      PATIENT INFORMATION: Name: Melanie Trevino DOB: January 07, 2028  Sex: F Date seen: 11/30/2014 Time: 0850  Address:  [address] [address]      Physician: Dorian Heckle, MD This evaluation/justification form will serve as the LMN for the following suppliers: __________________________ Supplier: [facility]  Contact Person: Luz Brazen Phone:  ?????   Seating Therapist: Rudell Cobb, PT Phone:   [phone]   Phone: [phone]    Spouse/Parent/Caregiver name: ?????  Phone number: ????? Insurance/Payer: Medicare/ mutual of [employer]     Reason for Referral: wheelchair evaluation due to recent development of pressure sore  Patient/Caregiver Goals: Improve ability to relieve pressure in wheelchair   MEDICAL HISTORY: Diagnosis: Primary Diagnosis: Arthritis in hip, h/o staph infection R leg with contracture Onset: ????? Diagnosis: Breast Cancer, Liver Cancer (active), renal mass (active), h/o acoustic neuroma tumor '95   _0 Progressive Disease Relevant past and future surgeries: mastectomies, h/o brain surgery '95   Height: 5'5" Weight: 241 LBS Explain recent changes or trends in weight: ?????   History: The patient reports non-ambulatory for 3-4 + years due to hip arthritis, h/o staPH infection in her leg, h/o brain surgery, general deconditioning     HOME ENVIRONMENT: _1 House  _2 Condo/town home  _3 Apartment  _4 Assisted Living    _5 Lives Alone _6  Lives with Others                                                                                          Hours with caregiver: 24 hours  _7 Home is accessible to patient            Stairs      _8 Yes _9  No     Ramp _10 Yes _11 No Comments:  ?????   COMMUNITY ADL: TRANSPORTATION: _12 Car    _13 Van    <QQIWLNLGXQJJHERD>_4<\/YCXKGYJEHUDJSHFW>_26 Public Transportation    _15 Adapted w/c Lift    _16 Ambulance    _17 Other:       _18 Sits in wheelchair during transport  Employment/School: ????? Specific requirements pertaining to mobility ?????  Other: ?????    FUNCTIONAL/SENSORY PROCESSING SKILLS:  Handedness:   _19 Right     _20 Left    _21 NA  Comments:  ?????  Functional Processing Skills for Wheeled Mobility _22 Processing Skills are adequate for safe wheelchair operation  Areas of concern than may interfere with safe operation of wheelchair Description of problem   _23  Attention to environment      _24 Judgment      _25  Hearing  _26  Vision or visual processing      _27 Motor Planning  _28  Fluctuations in Behavior  ?????    VERBAL COMMUNICATION: _29 WFL receptive _30  WFL expressive _31 Understandable  _32 Difficult to understand  _33 non-communicative _34  Uses an augmented communication device    SENSATION and SKIN ISSUES: Sensation _35 Intact  _36 Impaired _37 Absent  Level of sensation: ????? Pressure Relief: Able  to perform effective pressure relief :    _0 Yes  _1  No Method: ???? If not, Why?: because of immobility-currently requires home care aides to shift her weight  Skin Issues/Skin Integrity Current Skin Issues  _2 Yes _3 No _4 Intact _5  Red area_6  Open Area  _7 Scar Tissue _8 At risk from prolonged sitting Where  ?????  History of Skin Issues  _9 Yes _10 No Where  L ischial sore When  ?????  Hx of skin flap surgeries  _11 Yes _12 No Where  ????? When  ?????  Limited sitting tolerance _13 Yes _14 No Hours spent sitting in wheelchair daily: 12+ hours day (7am-9pm)  Complaint of Pain:  Please describe: bilateral gluteal pain and burning form increased pressure in current seating system, neck sore, R leg and hip (unable to straighten since the staph infection)   Swelling/Edema: yes, R anke > L ankle   ADL STATUS (in reference to  wheelchair use):  Indep Assist Unable Indep with Equip Not assessed Comments  Dressing ????? x ????? ????? ????? ?????  Eating x ????? ????? ????? ????? ?????  Toileting ????? ????? x ????? ????? uses bedpan, due to inability to stand   Bathing ????? ????? x ????? ????? sponge bathing by caregiver  Grooming/Hygiene ????? ????? x ????? ????? ?????  Meal Prep ????? ????? x ????? ????? ?????  IADLS ????? ????? x ????? ????? ?????  Bowel Management: _15 Continent  _16 Incontinent  _17 Accidents Comments:  ?????  Bladder Management: _18 Continent  _19 Incontinent  _20 Accidents Comments:  foley catheter    CURRENT SEATING / MOBILITY:  Current Mobility Base:  _21 None _22 Dependent _23 Manual _24 Scooter _25 Power  Type of Control: standard joystick  Manufacturer:  invacare prontoSize:  ?????Age: 51/2013  Current Condition of Mobility Base:  current wheelchair is in fair shape, but does not allow patient to independently relieve pressure   Current Wheelchair components:  captain's chair  Describe posture in present seating system:  L leaning with R hip elevated (leans away from R side to due pain and creates L pressure sore)    WHEELCHAIR SKILLS: Manual w/c Propulsion: _26 UE or LE strength and endurance sufficient to participate in ADLs using manual wheelchair Arm : _27 left _28 right   _29 Both      Distance: ????? Foot:  _30 left _31 right   _32 Both  Operate Scooter: _33  Strength, hand grip, balance and transfer appropriate for use _34 Living environment is accessible for use of scooter  Operate Power w/c:  _35  Std. Joystick   _36  Alternative Controls Indep _37  Assist _38  Dependent/unable _39  N/A _40   _41 Safe          _42  Functional      Distance: ?????  Bed confined without wheelchair _43  Yes _44  No   STRENGTH/RANGE OF MOTION:  ????? Range of Motion Strength  Shoulder R to 20 degrees (not functional since brain surgery per home care aide), L flexes to 80 degrees due to tightness 1/5 R shoulder, 2/5 L shoulder  Elbow WFLs  both sides 4/5 elbow flexion bilaterally  Wrist/Hand WFLs both sides 3/5 flexion/extension bilaterally  Hip R hip maintained in flexion with contractures noted *patient does not tolerate lying down due to pain L hip maintained in flexion 1/5 R hip flexion 3/5 L hip flexion  Knee R side flexion contracture -45 degrees from full extension L side flexion contracture -30 degrees from full extension 1/5 R knee extension 2/5 L knee extension  Ankle Tightness in heel cords, but functional for positioning 3/5 bilateral ankle dorsiflexion    MOBILITY/BALANCE:  _45  Patient is totally dependent for mobility  ?????  Balance Transfers Ambulation  Sitting Balance: Standing Balance: _0  Independent _1  Independent/Modified Independent  _2  WFL     _3  WFL _4  Supervision _5  Supervision  _6  Uses UE for balance  _7  Supervision _8  Min Assist _9  Ambulates with Assist  ?????    _10  Min Assist _11  Min assist _12  Mod Assist _13  Ambulates with Device:      _14  RW  _15  StW  _16  Cane  _17  ?????  _18  Mod Assist _19  Mod assist _20  Max assist   _21  Max Assist _22  Max assist _23  Dependent _24  Indep. Short Distance Only  _25  Unable _26  Unable _27  Lift / Sling Required Distance (in feet)  ?????   _28  Sliding board _29  Unable to Ambulate (see explanation below)  Cardio Status:  _30 Intact  _31  Impaired   _32  NA     ?????  Respiratory Status:  _33 Intact   _34 Impaired   _35 NA     ?????  Orthotics/Prosthetics: none  Comments: Pt uses sliding board for transfers in the home and is dependent for all mobility.       MAT EVALUATION: See vendor notes       POSTURE: COMMENTS:   Anterior / Posterior Obliquity Rotation-Pelvis ?????        PELVIS                _36  _37  _38   Neutral Posterior Anterior  _39  _40  _41   WFL Rt elev Lt elev  _42  _43  _44   WFL Right Left                      Anterior    Anterior      _45  Fixed _46  Other _47  Partly Flexible _48  Flexible   _49  Fixed _50  Other _51  Partly Flexible  _52  Flexible  _53  Fixed _54  Other _55   Partly Flexible  _56  Flexible     TRUNK Anterior / Posterior eft Right Rotation-shoulders and upper trunk    ?????    _57  _58  _59   WFL ? Thoracic ? Lumbar  Kyphosis Lordosis  _60  _61  _62   WFL Convex Convex  Right Left _63 c-curve _64 s-curve _65 multiple  _66  Neutral _67  Left-anterior _68  Right-anterior     _69  Fixed _70  Flexible _71  Partly Flexible _72  Other  _73  Fixed _74  Flexible _75  Partly Flexible _76  Other  _77  Fixed             _78  Flexible _79  Partly Flexible _80  Other    Position Windswept  ?????        HIPS                      _81            _82               _83    Neutral       Abduct        ADduct         _84           _85            _86   Neutral Right           Left      _87  Fixed _88  Subluxed _89  Partly Flexible _90  Dislocated _91  Flexible  _92  Fixed _93  Other _94  Partly Flexible  _95  Flexible                 Foot Positioning Knee Positioning  ?????    _96  WFL  _97 Lt _98 Rt [  x] WFL  _0 Lt _1 Rt    KNEES ROM concerns: ROM concerns:    & Dorsi-Flexed _2 Lt _3 Rt tightness in bilateral hamstrings    FEET Plantar Flexed _4 Lt _5 Rt      Inversion                 _6 Lt _7 Rt      Eversion                 _8 Lt _9 Rt     HEAD _10  Functional _11  Good Head Control  ?????  & _12  Flexed         _13  Extended _14  Adequate Head Control    NECK _15  Rotated  Lt  _16  Lat Flexed Lt _17  Rotated  Rt _18  Lat Flexed Rt _19  Limited Head Control     _20  Cervical Hyperextension _21  Absent  Head Control     SHOULDERS ELBOWS WRIST& HAND ?????      Left     Right    Left     Right    Left     Right   U/E _22 Functional           _23 Functional WfLs WFLs _24 Fisting             _25 Fisting      _26 elev   _27 dep      _28 elev   _29 dep       _30 pro -_31 retract     _32 pro  _33 retract _34 subluxed             _35 subluxed         Goals for Wheelchair Mobility  _36  Independence with mobility in the home with motor related ADLs (MRADLs)  _37  Independence with MRADLs in the community _38  Provide dependent mobility  _39  Provide recline     _40 Provide  tilt   Goals for Seating system _41  Optimize pressure distribution _42  Provide support needed to facilitate function or safety _43  Provide corrective forces to assist with maintaining or improving posture _44  Accommodate client's posture:   current seated postures and positions are not flexible or will not tolerate corrective forces _45  Client to be independent with relieving pressure in the wheelchair _46 Enhance physiological function such as breathing, swallowing, digestion  Simulation ideas/Equipment trials:patient currently in power wheelchair State why other equipment was unsuccessful:current equipment does not allow for independent pressure relief and patient has pressure sore   MOBILITY BASE RECOMMENDATIONS and JUSTIFICATION: MOBILITY COMPONENT JUSTIFICATION  Manufacturer: QuantumModel: J6   Size: Width 20Seat Depth 20 _47 provide transport from point A to B      _48 promote Indep mobility  _49 is not a safe, functional ambulator _50 walker or cane inadequate _51 non-standard width/depth necessary to accommodate anatomical measurement _52  ?????  _53 Manual Mobility Base _54 non-functional ambulator    _55 Scooter/POV  _56 can safely operate  _57 can safely transfer   _58 has adequate trunk stability  _59 cannot functionally propel manual w/c  _60 Power Mobility Base  _61 non-ambulatory  _62 cannot functionally propel manual wheelchair  _63  cannot functionally and safely operate scooter/POV _64 can safely operate and willing to  _65 Stroller Base _66 infant/child  _67 unable to propel manual wheelchair _68 allows for growth _69 non-functional ambulator _70 non-functional UE _71 Indep mobility is not a goal at this time  _72 Tilt  _73 Forward _74 Backward _75 Powered tilt  _76 Manual tilt  _77 change position against gravitational force on head and shoulders  _78 change position for pressure relief/cannot weight shift _79 transfers  _80 management of tone _81 rest periods _82 control edema _83 facilitate postural control  _84   ?????  _85 Recline  _86 Power recline on power base _87 Manual recline  on manual base  _0 accommodate femur to back angle  _1 bring to full recline for ADL care  _2 change position for pressure relief/cannot weight shift _3 rest periods _4 repositioning for transfers or clothing/diaper /catheter changes _5 head positioning  _6 Lighter weight required _7 self- propulsion  _8 lifting _9  ?????  _10 Heavy Duty required _11 user weight greater than 250# _12 extreme tone/ over active movement _13 broken frame on previous chair _14  ?????  _15  Back  _16  Angle Adjustable _17  Custom molded synergy _18 postural control _19 control of tone/spasticity _20 accommodation of range of motion _21 UE functional control _22 accommodation for seating system _23  ????? _24 provide lateral trunk support _25 accommodate deformity _26 provide posterior trunk support _27 provide lumbar/sacral support _28 support trunk in midline _29 Pressure relief over spinal processes  _30  Seat Cushion roho _31 impaired sensation  _32 decubitus ulcers present _33 history of pressure ulceration _34 prevent pelvic extension _35 low maintenance  _36 stabilize pelvis  _37 accommodate obliquity _38 accommodate multiple deformity _39 neutralize lower extremity position _40 increase pressure distribution _41  ?????  _42  Pelvic/thigh support  _43  Lateral thigh guide _44  Distal medial pad  _45  Distal lateral pad _46  pelvis in neutral _47 accommodate pelvis _48  position upper legs _49  alignment _50  accommodate ROM _51  decr adduction _52 accommodate tone _53 removable for transfers _54 decr abduction  _55  Lateral trunk Supports _56  Lt     _57  Rt _58 decrease lateral trunk leaning _59 control tone _60 contour for increased contact _61 safety  _62 accommodate asymmetry _63  ?????  _64  Mounting hardware  _65 lateral trunk supports  _66 back   _67 seat _68 headrest      _69  thigh support _70 fixed   _71 swing away _72 attach seat platform/cushion to w/c frame _73 attach back cushion to w/c frame _74 mount  postural supports _75 mount headrest  _76 swing medial thigh support away _77 swing lateral supports away for transfers  _78  ?????       Armrests  _79 fixed _80 adjustable height _81 removable   _82 swing away  _83 flip back   _84 reclining _85 full length pads _86 desk    _87 pads tubular  _88 provide support with elbow at 90   _89 provide support for w/c tray _90 change of height/angles for variable activities _91 remove for transfers _92 allow to come closer to table top _93 remove for access to tables _94  ?????  Hangers/ Leg rests  _95 60 _96 70 _97 90 _98 elevating _99 heavy duty  _100 articulating _101 fixed _102 lift off _103 swing away     _104 power _105 provide LE support  _106 accommodate to hamstring tightness _107 elevate legs during recline   _108 provide change in position for Legs _109 Maintain placement of feet on footplate _110 durability _111 enable transfers _112 decrease edema _113 Accommodate lower leg length _114  ?????  Foot support Footplate    <IPJASNKNLZJQBHAL>_9<\/FXTKWIOXBDZHGDJM>_426 Lt  _116  Rt  _117  Center mount _118 flip up     _119 depth/angle adjustable _120 Amputee adapter    _121  Lt     _122  Rt _123 provide foot support _124 accommodate to ankle ROM _125 transfers _126 Provide support for residual extremity _127  allow foot to go under wheelchair base _128  decrease tone  _129  ?????  _130  Ankle strap/heel loops _131 support foot on foot support _132 decrease extraneous movement _133 provide input to heel  _134 protect foot  Tires: _135 pneumatic  _136 flat free inserts  _137 solid  _138 decrease maintenance  _139 prevent frequent flats _140 increase shock absorbency _141 decrease pain from road shock _142 decrease spasms from road shock _143  ?????  _144  Headrest  _145 provide posterior head support _146 provide posterior neck support _147 provide lateral head support _148 provide anterior head support _149 support during tilt and recline _150 improve feeding   _151 improve respiration _152 placement of switches _153 safety  _154 accommodate ROM  _155 accommodate tone _156 improve visual orientation  _157  Anterior chest strap _158  Vest _159   Shoulder retractors  _160 decrease forward movement of shoulder _161 accommodation of TLSO _162 decrease forward movement of trunk _163   decrease shoulder elevation _0 added abdominal support _1 alignment _2 assistance with shoulder control  _3  ?????  Pelvic Positioner _4 Belt _5 SubASIS bar _6 Dual Pull _7 stabilize tone _8 decrease falling out of chair/ **will not Decr potential for sliding due to pelvic tilting _9 prevent excessive rotation _10 pad for protection over boney prominence _11 prominence comfort _12 special pull angle to control rotation _13  ?????  Upper Extremity Support _14 L   _15  R _16 Arm trough    _17 hand support _18  tray       _19 full tray _20 swivel mount _21 decrease edema      _22 decrease subluxation   _23 control tone   _24 placement for AAC/Computer/EADL _25 decrease gravitational pull on shoulders _26 provide midline positioning _27 provide support to increase UE function _28 provide hand support in natural position _29 provide work surface   POWER WHEELCHAIR CONTROLS  _30 Proportional  _31 Non-Proportional Type ????? _32 Left  _33 Right _34 provides access for controlling wheelchair   _35 lacks motor control to operate proportional drive control <ZOXWRUEAVWUJWJXB>_1<\/YNWGNFAOZHYQMVHQ>_46 unable to understand proportional controls  Actuator Control Module  _37 Single  _38 Multiple   _39 Allow the client to operate the power seat function(s) through the joystick control   _40 Safety Reset Switches _41 Used to change modes and stop the wheelchair when driving in latch mode    _42 Upgraded Electronics   _43 programming for accurate control _44 progressive Disease/changing condition _45 non-proportional drive control needed _46 Needed in order to operate power seat functions through joystick control   _47 Display box _48 Allows user to see in which mode and drive the wheelchair is set  _49 necessary for alternate controls    _50 Digital interface electronics _51 Allows w/c to operate when using alternative drive controls  <NGEXBMWUXLKGMWNU>_2<\/VOZDGUYQIHKVQQVZ>_56 ASL Head Array _53 Allows client to operate  wheelchair  through switches placed in tri-panel headrest  _54 Sip and puff with tubing kit _55 needed to operate sip and puff drive controls  <LOVFIEPPIRJJOACZ>_6<\/SAYTKZSWFUXNATFT>_73 Upgraded tracking electronics _57 increase safety when driving <UKGURKYHCWCBJSEG>_3<\/TDVVOHYWVPXTGGYI>_94 correct tracking when on uneven surfaces  _59 [facility] for switches or joystick _60 Attaches switches to w/c  _61 Swing away for access or transfers _62 midline for optimal placement _63 provides for consistent access  _64 Attendant controlled joystick plus mount _65 safety _66 long distance driving <WNIOEVOJJKKXFGHW>_2<\/XHBZJIRCVELFYBOF>_75 operation of seat functions _68 compliance with transportation regulations _69  ?????    Rear wheel placement/Axle adjustability _70 None _71 semi adjustable _72 fully adjustable  _73 improved UE access to wheels _74 improved stability _75 changing angle in space for improvement of postural stability _76 1-arm drive access <ZWCHENIDPOEUMPNT>_6<\/RWERXVQMGQQPYPPJ>_09 amputee pad placement _78  ?????  Wheel rims/ hand rims  _79 metal  _80 plastic coated _81 oblique projections _82 vertical projections _83 Provide ability to propel manual wheelchair  _84  Increase self-propulsion with hand weakness/decreased grasp  Push handles _85 extended  _86 angle adjustable  _87 standard _88 caregiver access _89 caregiver assist _90 allows "hooking" to enable increased ability to perform ADLs or maintain balance  One armed device  _91 Lt   _92 Rt _93 enable propulsion of manual wheelchair with one arm   _94  ?????   Brake/wheel lock extension _95  Lt   _96  Rt _97 increase indep in applying wheel locks   _98 Side guards _99 prevent clothing getting caught in wheel or becoming soiled _100  prevent skin tears/abrasions  Battery: U1 x 2 _101 to power wheelchair ?????  Other: ????? ????? ?????  The above equipment has a life- long use expectancy. Growth and changes in medical and/or functional conditions would be the exceptions. This is to certify that the therapist has no financial relationship with durable medical provider or manufacturer. The therapist will not receive remuneration of any kind for the equipment recommended in  this evaluation.   Patient has mobility limitation that significantly impairs safe, timely participation in one or more mobility related ADL's.  (bathing, toileting, feeding, dressing, grooming, moving  from room to room)                                       _0  Yes _1  No Will mobility device sufficiently improve ability to participate and/or be aided in participation of MRADL's?         _2  Yes _3  No Can limitation be compensated for with use of a cane or walker?                                                      _4  Yes _5  No Does patient or caregiver demonstrate ability/potential ability & willingness to safely use the mobility device?   _6  Yes _7  No Does patient's home environment support use of recommended mobility device?                                _8  Yes _9  No Does patient have sufficient upper extremity function necessary to functionally propel a manual wheelchair?    _10  Yes _11  No Does patient have sufficient strength and trunk stability to safely operate a POV (scooter)?                   _12  Yes _13  No Does patient need additional features/benefits provided by a power wheelchair for MRADL's in the home?        _14  Yes _15  No Does the patient demonstrate the ability to safely use a power wheelchair?                                    _16  Yes _17  No  Therapist Name Printed: Rudell Cobb, MPT Date: 11/30/2014  Therapist's Signature:   Date:   Supplier's Name Printed: [employer], [employer] Date: 11/30/2014  Supplier's Signature:   Date:  Patient/Caregiver Signature:   Date:     This is to certify that I have read this evaluation and do agree with the content within:    Physician's Name Printed: Dorian Heckle, MD  37 Signature:  Date:     This is to certify that I, the above signed therapist have the following affiliations: _18  This DME provider _19  Manufacturer of recommended equipment _20  Patient's long term care facility _21  None of the  above      [place] I 12/05/2014, 10:02 AM  [facility] [address] [place], [place], [address] Phone: [phone]   Fax:  [phone]

## 2014-12-08 DIAGNOSIS — F0280 Dementia in other diseases classified elsewhere without behavioral disturbance: Secondary | ICD-10-CM | POA: Diagnosis not present

## 2014-12-08 DIAGNOSIS — R208 Other disturbances of skin sensation: Secondary | ICD-10-CM | POA: Diagnosis not present

## 2014-12-08 DIAGNOSIS — G510 Bell's palsy: Secondary | ICD-10-CM | POA: Diagnosis not present

## 2014-12-08 DIAGNOSIS — G629 Polyneuropathy, unspecified: Secondary | ICD-10-CM | POA: Diagnosis not present

## 2014-12-08 DIAGNOSIS — G309 Alzheimer's disease, unspecified: Secondary | ICD-10-CM | POA: Diagnosis not present

## 2014-12-12 DIAGNOSIS — I872 Venous insufficiency (chronic) (peripheral): Secondary | ICD-10-CM | POA: Diagnosis not present

## 2014-12-12 DIAGNOSIS — N183 Chronic kidney disease, stage 3 (moderate): Secondary | ICD-10-CM | POA: Diagnosis not present

## 2014-12-12 DIAGNOSIS — Z993 Dependence on wheelchair: Secondary | ICD-10-CM | POA: Diagnosis not present

## 2014-12-12 DIAGNOSIS — I129 Hypertensive chronic kidney disease with stage 1 through stage 4 chronic kidney disease, or unspecified chronic kidney disease: Secondary | ICD-10-CM | POA: Diagnosis not present

## 2014-12-13 DIAGNOSIS — Z466 Encounter for fitting and adjustment of urinary device: Secondary | ICD-10-CM | POA: Diagnosis not present

## 2014-12-13 DIAGNOSIS — I10 Essential (primary) hypertension: Secondary | ICD-10-CM | POA: Diagnosis not present

## 2014-12-13 DIAGNOSIS — R32 Unspecified urinary incontinence: Secondary | ICD-10-CM | POA: Diagnosis not present

## 2014-12-13 DIAGNOSIS — N319 Neuromuscular dysfunction of bladder, unspecified: Secondary | ICD-10-CM | POA: Diagnosis not present

## 2014-12-17 DIAGNOSIS — Z79891 Long term (current) use of opiate analgesic: Secondary | ICD-10-CM | POA: Diagnosis not present

## 2014-12-17 DIAGNOSIS — Z466 Encounter for fitting and adjustment of urinary device: Secondary | ICD-10-CM | POA: Diagnosis not present

## 2014-12-17 DIAGNOSIS — N319 Neuromuscular dysfunction of bladder, unspecified: Secondary | ICD-10-CM | POA: Diagnosis not present

## 2014-12-17 DIAGNOSIS — R32 Unspecified urinary incontinence: Secondary | ICD-10-CM | POA: Diagnosis not present

## 2014-12-17 DIAGNOSIS — I10 Essential (primary) hypertension: Secondary | ICD-10-CM | POA: Diagnosis not present

## 2014-12-18 DIAGNOSIS — R32 Unspecified urinary incontinence: Secondary | ICD-10-CM | POA: Diagnosis not present

## 2014-12-18 DIAGNOSIS — I10 Essential (primary) hypertension: Secondary | ICD-10-CM | POA: Diagnosis not present

## 2014-12-18 DIAGNOSIS — N319 Neuromuscular dysfunction of bladder, unspecified: Secondary | ICD-10-CM | POA: Diagnosis not present

## 2014-12-18 DIAGNOSIS — Z79891 Long term (current) use of opiate analgesic: Secondary | ICD-10-CM | POA: Diagnosis not present

## 2014-12-18 DIAGNOSIS — Z466 Encounter for fitting and adjustment of urinary device: Secondary | ICD-10-CM | POA: Diagnosis not present

## 2014-12-19 DIAGNOSIS — R293 Abnormal posture: Secondary | ICD-10-CM | POA: Diagnosis not present

## 2014-12-19 DIAGNOSIS — R269 Unspecified abnormalities of gait and mobility: Secondary | ICD-10-CM | POA: Diagnosis not present

## 2014-12-19 DIAGNOSIS — M160 Bilateral primary osteoarthritis of hip: Secondary | ICD-10-CM | POA: Diagnosis not present

## 2014-12-19 DIAGNOSIS — Z741 Need for assistance with personal care: Secondary | ICD-10-CM | POA: Diagnosis not present

## 2014-12-19 DIAGNOSIS — M170 Bilateral primary osteoarthritis of knee: Secondary | ICD-10-CM | POA: Diagnosis not present

## 2014-12-19 DIAGNOSIS — L89220 Pressure ulcer of left hip, unstageable: Secondary | ICD-10-CM | POA: Diagnosis not present

## 2014-12-19 DIAGNOSIS — L8991 Pressure ulcer of unspecified site, stage 1: Secondary | ICD-10-CM | POA: Diagnosis not present

## 2015-01-15 DIAGNOSIS — N2889 Other specified disorders of kidney and ureter: Secondary | ICD-10-CM | POA: Diagnosis not present

## 2015-01-18 DIAGNOSIS — R32 Unspecified urinary incontinence: Secondary | ICD-10-CM | POA: Diagnosis not present

## 2015-01-18 DIAGNOSIS — N319 Neuromuscular dysfunction of bladder, unspecified: Secondary | ICD-10-CM | POA: Diagnosis not present

## 2015-01-18 DIAGNOSIS — Z79891 Long term (current) use of opiate analgesic: Secondary | ICD-10-CM | POA: Diagnosis not present

## 2015-01-18 DIAGNOSIS — I10 Essential (primary) hypertension: Secondary | ICD-10-CM | POA: Diagnosis not present

## 2015-01-18 DIAGNOSIS — Z466 Encounter for fitting and adjustment of urinary device: Secondary | ICD-10-CM | POA: Diagnosis not present

## 2015-02-14 DIAGNOSIS — N319 Neuromuscular dysfunction of bladder, unspecified: Secondary | ICD-10-CM | POA: Diagnosis not present

## 2015-02-14 DIAGNOSIS — Z79891 Long term (current) use of opiate analgesic: Secondary | ICD-10-CM | POA: Diagnosis not present

## 2015-02-14 DIAGNOSIS — I10 Essential (primary) hypertension: Secondary | ICD-10-CM | POA: Diagnosis not present

## 2015-02-14 DIAGNOSIS — R32 Unspecified urinary incontinence: Secondary | ICD-10-CM | POA: Diagnosis not present

## 2015-02-14 DIAGNOSIS — Z466 Encounter for fitting and adjustment of urinary device: Secondary | ICD-10-CM | POA: Diagnosis not present

## 2015-02-15 DIAGNOSIS — I10 Essential (primary) hypertension: Secondary | ICD-10-CM | POA: Diagnosis not present

## 2015-02-15 DIAGNOSIS — Z79891 Long term (current) use of opiate analgesic: Secondary | ICD-10-CM | POA: Diagnosis not present

## 2015-02-15 DIAGNOSIS — R32 Unspecified urinary incontinence: Secondary | ICD-10-CM | POA: Diagnosis not present

## 2015-02-15 DIAGNOSIS — N319 Neuromuscular dysfunction of bladder, unspecified: Secondary | ICD-10-CM | POA: Diagnosis not present

## 2015-02-15 DIAGNOSIS — Z466 Encounter for fitting and adjustment of urinary device: Secondary | ICD-10-CM | POA: Diagnosis not present

## 2015-02-16 DIAGNOSIS — N319 Neuromuscular dysfunction of bladder, unspecified: Secondary | ICD-10-CM | POA: Diagnosis not present

## 2015-02-16 DIAGNOSIS — Z79891 Long term (current) use of opiate analgesic: Secondary | ICD-10-CM | POA: Diagnosis not present

## 2015-02-16 DIAGNOSIS — I10 Essential (primary) hypertension: Secondary | ICD-10-CM | POA: Diagnosis not present

## 2015-02-16 DIAGNOSIS — R32 Unspecified urinary incontinence: Secondary | ICD-10-CM | POA: Diagnosis not present

## 2015-02-16 DIAGNOSIS — Z466 Encounter for fitting and adjustment of urinary device: Secondary | ICD-10-CM | POA: Diagnosis not present

## 2015-02-20 DIAGNOSIS — C494 Malignant neoplasm of connective and soft tissue of abdomen: Secondary | ICD-10-CM | POA: Diagnosis not present

## 2015-02-20 DIAGNOSIS — C179 Malignant neoplasm of small intestine, unspecified: Secondary | ICD-10-CM | POA: Diagnosis not present

## 2015-02-20 DIAGNOSIS — K439 Ventral hernia without obstruction or gangrene: Secondary | ICD-10-CM | POA: Diagnosis not present

## 2015-02-20 DIAGNOSIS — N2889 Other specified disorders of kidney and ureter: Secondary | ICD-10-CM | POA: Diagnosis not present

## 2015-02-20 DIAGNOSIS — K769 Liver disease, unspecified: Secondary | ICD-10-CM | POA: Diagnosis not present

## 2015-02-20 DIAGNOSIS — C178 Malignant neoplasm of overlapping sites of small intestine: Secondary | ICD-10-CM | POA: Diagnosis not present

## 2015-02-20 DIAGNOSIS — Z9049 Acquired absence of other specified parts of digestive tract: Secondary | ICD-10-CM | POA: Diagnosis not present

## 2015-02-21 DIAGNOSIS — G510 Bell's palsy: Secondary | ICD-10-CM | POA: Diagnosis not present

## 2015-02-21 DIAGNOSIS — H04123 Dry eye syndrome of bilateral lacrimal glands: Secondary | ICD-10-CM | POA: Diagnosis not present

## 2015-02-21 DIAGNOSIS — H16211 Exposure keratoconjunctivitis, right eye: Secondary | ICD-10-CM | POA: Diagnosis not present

## 2015-03-19 DIAGNOSIS — R32 Unspecified urinary incontinence: Secondary | ICD-10-CM | POA: Diagnosis not present

## 2015-03-19 DIAGNOSIS — I10 Essential (primary) hypertension: Secondary | ICD-10-CM | POA: Diagnosis not present

## 2015-03-19 DIAGNOSIS — Z79891 Long term (current) use of opiate analgesic: Secondary | ICD-10-CM | POA: Diagnosis not present

## 2015-03-19 DIAGNOSIS — Z466 Encounter for fitting and adjustment of urinary device: Secondary | ICD-10-CM | POA: Diagnosis not present

## 2015-03-19 DIAGNOSIS — N319 Neuromuscular dysfunction of bladder, unspecified: Secondary | ICD-10-CM | POA: Diagnosis not present

## 2015-03-20 DIAGNOSIS — R300 Dysuria: Secondary | ICD-10-CM | POA: Diagnosis not present

## 2015-03-20 DIAGNOSIS — R58 Hemorrhage, not elsewhere classified: Secondary | ICD-10-CM | POA: Diagnosis not present

## 2015-03-20 DIAGNOSIS — N183 Chronic kidney disease, stage 3 (moderate): Secondary | ICD-10-CM | POA: Diagnosis not present

## 2015-03-20 DIAGNOSIS — C50919 Malignant neoplasm of unspecified site of unspecified female breast: Secondary | ICD-10-CM | POA: Diagnosis not present

## 2015-03-20 DIAGNOSIS — I10 Essential (primary) hypertension: Secondary | ICD-10-CM | POA: Diagnosis not present

## 2015-03-20 DIAGNOSIS — C229 Malignant neoplasm of liver, not specified as primary or secondary: Secondary | ICD-10-CM | POA: Diagnosis not present

## 2015-04-06 DIAGNOSIS — R208 Other disturbances of skin sensation: Secondary | ICD-10-CM | POA: Diagnosis not present

## 2015-04-06 DIAGNOSIS — F0280 Dementia in other diseases classified elsewhere without behavioral disturbance: Secondary | ICD-10-CM | POA: Diagnosis not present

## 2015-04-06 DIAGNOSIS — D333 Benign neoplasm of cranial nerves: Secondary | ICD-10-CM | POA: Diagnosis not present

## 2015-04-06 DIAGNOSIS — G309 Alzheimer's disease, unspecified: Secondary | ICD-10-CM | POA: Diagnosis not present

## 2015-04-06 DIAGNOSIS — G629 Polyneuropathy, unspecified: Secondary | ICD-10-CM | POA: Diagnosis not present

## 2015-04-16 DIAGNOSIS — I10 Essential (primary) hypertension: Secondary | ICD-10-CM | POA: Diagnosis not present

## 2015-04-16 DIAGNOSIS — R32 Unspecified urinary incontinence: Secondary | ICD-10-CM | POA: Diagnosis not present

## 2015-04-16 DIAGNOSIS — N2889 Other specified disorders of kidney and ureter: Secondary | ICD-10-CM | POA: Diagnosis not present

## 2015-04-16 DIAGNOSIS — Z79891 Long term (current) use of opiate analgesic: Secondary | ICD-10-CM | POA: Diagnosis not present

## 2015-04-16 DIAGNOSIS — Z466 Encounter for fitting and adjustment of urinary device: Secondary | ICD-10-CM | POA: Diagnosis not present

## 2015-04-16 DIAGNOSIS — N319 Neuromuscular dysfunction of bladder, unspecified: Secondary | ICD-10-CM | POA: Diagnosis not present

## 2015-05-07 DIAGNOSIS — Z79891 Long term (current) use of opiate analgesic: Secondary | ICD-10-CM | POA: Diagnosis not present

## 2015-05-07 DIAGNOSIS — R32 Unspecified urinary incontinence: Secondary | ICD-10-CM | POA: Diagnosis not present

## 2015-05-07 DIAGNOSIS — Z466 Encounter for fitting and adjustment of urinary device: Secondary | ICD-10-CM | POA: Diagnosis not present

## 2015-05-07 DIAGNOSIS — N319 Neuromuscular dysfunction of bladder, unspecified: Secondary | ICD-10-CM | POA: Diagnosis not present

## 2015-05-07 DIAGNOSIS — I10 Essential (primary) hypertension: Secondary | ICD-10-CM | POA: Diagnosis not present

## 2015-05-26 DIAGNOSIS — R32 Unspecified urinary incontinence: Secondary | ICD-10-CM | POA: Diagnosis not present

## 2015-05-26 DIAGNOSIS — Z79891 Long term (current) use of opiate analgesic: Secondary | ICD-10-CM | POA: Diagnosis not present

## 2015-05-26 DIAGNOSIS — Z466 Encounter for fitting and adjustment of urinary device: Secondary | ICD-10-CM | POA: Diagnosis not present

## 2015-05-26 DIAGNOSIS — N319 Neuromuscular dysfunction of bladder, unspecified: Secondary | ICD-10-CM | POA: Diagnosis not present

## 2015-05-26 DIAGNOSIS — I10 Essential (primary) hypertension: Secondary | ICD-10-CM | POA: Diagnosis not present

## 2015-06-11 DIAGNOSIS — N319 Neuromuscular dysfunction of bladder, unspecified: Secondary | ICD-10-CM | POA: Diagnosis not present

## 2015-06-11 DIAGNOSIS — R32 Unspecified urinary incontinence: Secondary | ICD-10-CM | POA: Diagnosis not present

## 2015-06-11 DIAGNOSIS — Z466 Encounter for fitting and adjustment of urinary device: Secondary | ICD-10-CM | POA: Diagnosis not present

## 2015-06-11 DIAGNOSIS — I10 Essential (primary) hypertension: Secondary | ICD-10-CM | POA: Diagnosis not present

## 2015-06-11 DIAGNOSIS — Z79891 Long term (current) use of opiate analgesic: Secondary | ICD-10-CM | POA: Diagnosis not present

## 2015-06-12 DIAGNOSIS — Z993 Dependence on wheelchair: Secondary | ICD-10-CM | POA: Diagnosis not present

## 2015-06-12 DIAGNOSIS — L299 Pruritus, unspecified: Secondary | ICD-10-CM | POA: Diagnosis not present

## 2015-06-12 DIAGNOSIS — I10 Essential (primary) hypertension: Secondary | ICD-10-CM | POA: Diagnosis not present

## 2015-06-13 DIAGNOSIS — R32 Unspecified urinary incontinence: Secondary | ICD-10-CM | POA: Diagnosis not present

## 2015-06-13 DIAGNOSIS — Z79891 Long term (current) use of opiate analgesic: Secondary | ICD-10-CM | POA: Diagnosis not present

## 2015-06-13 DIAGNOSIS — I10 Essential (primary) hypertension: Secondary | ICD-10-CM | POA: Diagnosis not present

## 2015-06-13 DIAGNOSIS — Z466 Encounter for fitting and adjustment of urinary device: Secondary | ICD-10-CM | POA: Diagnosis not present

## 2015-06-13 DIAGNOSIS — N319 Neuromuscular dysfunction of bladder, unspecified: Secondary | ICD-10-CM | POA: Diagnosis not present

## 2015-06-15 DIAGNOSIS — R32 Unspecified urinary incontinence: Secondary | ICD-10-CM | POA: Diagnosis not present

## 2015-06-15 DIAGNOSIS — N319 Neuromuscular dysfunction of bladder, unspecified: Secondary | ICD-10-CM | POA: Diagnosis not present

## 2015-06-15 DIAGNOSIS — Z466 Encounter for fitting and adjustment of urinary device: Secondary | ICD-10-CM | POA: Diagnosis not present

## 2015-06-15 DIAGNOSIS — Z79891 Long term (current) use of opiate analgesic: Secondary | ICD-10-CM | POA: Diagnosis not present

## 2015-06-15 DIAGNOSIS — I10 Essential (primary) hypertension: Secondary | ICD-10-CM | POA: Diagnosis not present

## 2015-06-21 DIAGNOSIS — N183 Chronic kidney disease, stage 3 (moderate): Secondary | ICD-10-CM | POA: Diagnosis not present

## 2015-06-21 DIAGNOSIS — R32 Unspecified urinary incontinence: Secondary | ICD-10-CM | POA: Diagnosis not present

## 2015-06-21 DIAGNOSIS — I10 Essential (primary) hypertension: Secondary | ICD-10-CM | POA: Diagnosis not present

## 2015-06-21 DIAGNOSIS — C50919 Malignant neoplasm of unspecified site of unspecified female breast: Secondary | ICD-10-CM | POA: Diagnosis not present

## 2015-06-21 DIAGNOSIS — N319 Neuromuscular dysfunction of bladder, unspecified: Secondary | ICD-10-CM | POA: Diagnosis not present

## 2015-06-21 DIAGNOSIS — D333 Benign neoplasm of cranial nerves: Secondary | ICD-10-CM | POA: Diagnosis not present

## 2015-06-21 DIAGNOSIS — D214 Benign neoplasm of connective and other soft tissue of abdomen: Secondary | ICD-10-CM | POA: Diagnosis not present

## 2015-06-26 DIAGNOSIS — C787 Secondary malignant neoplasm of liver and intrahepatic bile duct: Secondary | ICD-10-CM | POA: Diagnosis not present

## 2015-06-26 DIAGNOSIS — C494 Malignant neoplasm of connective and soft tissue of abdomen: Secondary | ICD-10-CM | POA: Diagnosis not present

## 2015-06-26 DIAGNOSIS — N2889 Other specified disorders of kidney and ureter: Secondary | ICD-10-CM | POA: Diagnosis not present

## 2015-06-26 DIAGNOSIS — Z853 Personal history of malignant neoplasm of breast: Secondary | ICD-10-CM | POA: Diagnosis not present

## 2015-06-26 DIAGNOSIS — Z880 Allergy status to penicillin: Secondary | ICD-10-CM | POA: Diagnosis not present

## 2015-06-26 DIAGNOSIS — Z9011 Acquired absence of right breast and nipple: Secondary | ICD-10-CM | POA: Diagnosis not present

## 2015-06-26 DIAGNOSIS — Z170 Estrogen receptor positive status [ER+]: Secondary | ICD-10-CM | POA: Diagnosis not present

## 2015-06-26 DIAGNOSIS — Z90710 Acquired absence of both cervix and uterus: Secondary | ICD-10-CM | POA: Diagnosis not present

## 2015-07-03 DIAGNOSIS — N319 Neuromuscular dysfunction of bladder, unspecified: Secondary | ICD-10-CM | POA: Diagnosis not present

## 2015-07-03 DIAGNOSIS — Z79891 Long term (current) use of opiate analgesic: Secondary | ICD-10-CM | POA: Diagnosis not present

## 2015-07-03 DIAGNOSIS — R32 Unspecified urinary incontinence: Secondary | ICD-10-CM | POA: Diagnosis not present

## 2015-07-03 DIAGNOSIS — Z466 Encounter for fitting and adjustment of urinary device: Secondary | ICD-10-CM | POA: Diagnosis not present

## 2015-07-03 DIAGNOSIS — I10 Essential (primary) hypertension: Secondary | ICD-10-CM | POA: Diagnosis not present

## 2015-07-07 ENCOUNTER — Emergency Department (HOSPITAL_COMMUNITY): Payer: Medicare Other

## 2015-07-07 ENCOUNTER — Encounter (HOSPITAL_COMMUNITY): Payer: Self-pay | Admitting: Emergency Medicine

## 2015-07-07 ENCOUNTER — Inpatient Hospital Stay (HOSPITAL_COMMUNITY)
Admission: EM | Admit: 2015-07-07 | Discharge: 2015-07-12 | DRG: 872 | Disposition: A | Discharge status: Home / Self Care | Payer: Medicare Other | Attending: Family Medicine | Admitting: Family Medicine

## 2015-07-07 DIAGNOSIS — R1110 Vomiting, unspecified: Secondary | ICD-10-CM | POA: Diagnosis not present

## 2015-07-07 DIAGNOSIS — Z79899 Other long term (current) drug therapy: Secondary | ICD-10-CM | POA: Diagnosis not present

## 2015-07-07 DIAGNOSIS — D3610 Benign neoplasm of peripheral nerves and autonomic nervous system, unspecified: Secondary | ICD-10-CM | POA: Diagnosis present

## 2015-07-07 DIAGNOSIS — C50919 Malignant neoplasm of unspecified site of unspecified female breast: Secondary | ICD-10-CM | POA: Diagnosis not present

## 2015-07-07 DIAGNOSIS — R569 Unspecified convulsions: Secondary | ICD-10-CM | POA: Diagnosis present

## 2015-07-07 DIAGNOSIS — Y846 Urinary catheterization as the cause of abnormal reaction of the patient, or of later complication, without mention of misadventure at the time of the procedure: Secondary | ICD-10-CM | POA: Diagnosis present

## 2015-07-07 DIAGNOSIS — D6959 Other secondary thrombocytopenia: Secondary | ICD-10-CM | POA: Diagnosis present

## 2015-07-07 DIAGNOSIS — I959 Hypotension, unspecified: Secondary | ICD-10-CM | POA: Diagnosis present

## 2015-07-07 DIAGNOSIS — Z91013 Allergy to seafood: Secondary | ICD-10-CM

## 2015-07-07 DIAGNOSIS — C494 Malignant neoplasm of connective and soft tissue of abdomen: Secondary | ICD-10-CM | POA: Diagnosis present

## 2015-07-07 DIAGNOSIS — K5900 Constipation, unspecified: Secondary | ICD-10-CM | POA: Diagnosis not present

## 2015-07-07 DIAGNOSIS — N2889 Other specified disorders of kidney and ureter: Secondary | ICD-10-CM | POA: Diagnosis present

## 2015-07-07 DIAGNOSIS — R740 Nonspecific elevation of levels of transaminase and lactic acid dehydrogenase [LDH]: Secondary | ICD-10-CM | POA: Diagnosis present

## 2015-07-07 DIAGNOSIS — E860 Dehydration: Secondary | ICD-10-CM | POA: Diagnosis present

## 2015-07-07 DIAGNOSIS — B9689 Other specified bacterial agents as the cause of diseases classified elsewhere: Secondary | ICD-10-CM | POA: Diagnosis present

## 2015-07-07 DIAGNOSIS — Z853 Personal history of malignant neoplasm of breast: Secondary | ICD-10-CM | POA: Diagnosis not present

## 2015-07-07 DIAGNOSIS — I10 Essential (primary) hypertension: Secondary | ICD-10-CM | POA: Diagnosis present

## 2015-07-07 DIAGNOSIS — G309 Alzheimer's disease, unspecified: Secondary | ICD-10-CM | POA: Diagnosis present

## 2015-07-07 DIAGNOSIS — R7401 Elevation of levels of liver transaminase levels: Secondary | ICD-10-CM | POA: Diagnosis present

## 2015-07-07 DIAGNOSIS — T8351XA Infection and inflammatory reaction due to indwelling urinary catheter, initial encounter: Secondary | ICD-10-CM | POA: Diagnosis present

## 2015-07-07 DIAGNOSIS — N179 Acute kidney failure, unspecified: Secondary | ICD-10-CM | POA: Diagnosis not present

## 2015-07-07 DIAGNOSIS — F0280 Dementia in other diseases classified elsewhere without behavioral disturbance: Secondary | ICD-10-CM | POA: Diagnosis present

## 2015-07-07 DIAGNOSIS — K8020 Calculus of gallbladder without cholecystitis without obstruction: Secondary | ICD-10-CM | POA: Diagnosis present

## 2015-07-07 DIAGNOSIS — E780 Pure hypercholesterolemia: Secondary | ICD-10-CM | POA: Diagnosis present

## 2015-07-07 DIAGNOSIS — N390 Urinary tract infection, site not specified: Secondary | ICD-10-CM | POA: Diagnosis not present

## 2015-07-07 DIAGNOSIS — Z888 Allergy status to other drugs, medicaments and biological substances status: Secondary | ICD-10-CM | POA: Diagnosis not present

## 2015-07-07 DIAGNOSIS — R7881 Bacteremia: Secondary | ICD-10-CM | POA: Diagnosis present

## 2015-07-07 DIAGNOSIS — R062 Wheezing: Secondary | ICD-10-CM | POA: Diagnosis present

## 2015-07-07 DIAGNOSIS — D689 Coagulation defect, unspecified: Secondary | ICD-10-CM | POA: Diagnosis present

## 2015-07-07 DIAGNOSIS — A419 Sepsis, unspecified organism: Secondary | ICD-10-CM | POA: Diagnosis not present

## 2015-07-07 DIAGNOSIS — R319 Hematuria, unspecified: Secondary | ICD-10-CM | POA: Diagnosis present

## 2015-07-07 DIAGNOSIS — Z9221 Personal history of antineoplastic chemotherapy: Secondary | ICD-10-CM

## 2015-07-07 DIAGNOSIS — R29810 Facial weakness: Secondary | ICD-10-CM | POA: Diagnosis not present

## 2015-07-07 DIAGNOSIS — Z993 Dependence on wheelchair: Secondary | ICD-10-CM | POA: Diagnosis not present

## 2015-07-07 DIAGNOSIS — R109 Unspecified abdominal pain: Secondary | ICD-10-CM | POA: Diagnosis not present

## 2015-07-07 DIAGNOSIS — C787 Secondary malignant neoplasm of liver and intrahepatic bile duct: Secondary | ICD-10-CM | POA: Diagnosis present

## 2015-07-07 DIAGNOSIS — Z880 Allergy status to penicillin: Secondary | ICD-10-CM | POA: Diagnosis not present

## 2015-07-07 DIAGNOSIS — E876 Hypokalemia: Secondary | ICD-10-CM | POA: Diagnosis present

## 2015-07-07 DIAGNOSIS — I509 Heart failure, unspecified: Secondary | ICD-10-CM | POA: Diagnosis present

## 2015-07-07 DIAGNOSIS — D630 Anemia in neoplastic disease: Secondary | ICD-10-CM | POA: Diagnosis not present

## 2015-07-07 DIAGNOSIS — C801 Malignant (primary) neoplasm, unspecified: Secondary | ICD-10-CM

## 2015-07-07 DIAGNOSIS — B961 Klebsiella pneumoniae [K. pneumoniae] as the cause of diseases classified elsewhere: Secondary | ICD-10-CM | POA: Diagnosis present

## 2015-07-07 DIAGNOSIS — I517 Cardiomegaly: Secondary | ICD-10-CM | POA: Diagnosis not present

## 2015-07-07 DIAGNOSIS — Z978 Presence of other specified devices: Secondary | ICD-10-CM

## 2015-07-07 DIAGNOSIS — K8021 Calculus of gallbladder without cholecystitis with obstruction: Secondary | ICD-10-CM

## 2015-07-07 DIAGNOSIS — D696 Thrombocytopenia, unspecified: Secondary | ICD-10-CM | POA: Diagnosis present

## 2015-07-07 DIAGNOSIS — C49A0 Gastrointestinal stromal tumor, unspecified site: Secondary | ICD-10-CM | POA: Diagnosis present

## 2015-07-07 DIAGNOSIS — R253 Fasciculation: Secondary | ICD-10-CM | POA: Diagnosis present

## 2015-07-07 DIAGNOSIS — Z960 Presence of urogenital implants: Secondary | ICD-10-CM

## 2015-07-07 HISTORY — DX: Dementia in other diseases classified elsewhere without behavioral disturbance: F02.80

## 2015-07-07 HISTORY — DX: Other specified disorders of kidney and ureter: N28.89

## 2015-07-07 HISTORY — DX: Gastrointestinal stromal tumor, unspecified site: C49.A0

## 2015-07-07 HISTORY — DX: Malignant neoplasm of unspecified site of unspecified female breast: C50.919

## 2015-07-07 HISTORY — DX: Presence of urogenital implants: Z96.0

## 2015-07-07 HISTORY — DX: Pure hypercholesterolemia, unspecified: E78.00

## 2015-07-07 HISTORY — DX: Dependence on wheelchair: Z99.3

## 2015-07-07 HISTORY — DX: Secondary malignant neoplasm of liver and intrahepatic bile duct: C78.7

## 2015-07-07 HISTORY — DX: Alzheimer's disease, unspecified: G30.9

## 2015-07-07 HISTORY — DX: Malignant (primary) neoplasm, unspecified: C80.1

## 2015-07-07 LAB — COMPREHENSIVE METABOLIC PANEL
ALBUMIN: 3.4 g/dL — AB (ref 3.5–5.0)
ALK PHOS: 221 U/L — AB (ref 38–126)
ALT: 568 U/L — AB (ref 14–54)
ANION GAP: 4 — AB (ref 5–15)
AST: 523 U/L — ABNORMAL HIGH (ref 15–41)
BILIRUBIN TOTAL: 3.2 mg/dL — AB (ref 0.3–1.2)
BUN: 31 mg/dL — ABNORMAL HIGH (ref 6–20)
CALCIUM: 8.4 mg/dL — AB (ref 8.9–10.3)
CO2: 28 mmol/L (ref 22–32)
CREATININE: 1.86 mg/dL — AB (ref 0.44–1.00)
Chloride: 102 mmol/L (ref 101–111)
GFR calc Af Amer: 28 mL/min — ABNORMAL LOW (ref >60)
GFR calc non Af Amer: 24 mL/min — ABNORMAL LOW (ref >60)
GLUCOSE: 112 mg/dL — AB (ref 65–99)
Potassium: 4.7 mmol/L (ref 3.5–5.1)
SODIUM: 134 mmol/L — AB (ref 135–145)
TOTAL PROTEIN: 5.9 g/dL — AB (ref 6.5–8.1)

## 2015-07-07 LAB — CBC WITH DIFFERENTIAL/PLATELET
BASOS ABS: 0 10*3/uL (ref 0.0–0.1)
BASOS PCT: 0 % (ref 0–1)
EOS ABS: 0 10*3/uL (ref 0.0–0.7)
EOS PCT: 0 % (ref 0–5)
HCT: 31.8 % — ABNORMAL LOW (ref 36.0–46.0)
Hemoglobin: 10.4 g/dL — ABNORMAL LOW (ref 12.0–15.0)
Lymphocytes Relative: 4 % — ABNORMAL LOW (ref 12–46)
Lymphs Abs: 0.6 10*3/uL — ABNORMAL LOW (ref 0.7–4.0)
MCH: 31.2 pg (ref 26.0–34.0)
MCHC: 32.7 g/dL (ref 30.0–36.0)
MCV: 95.5 fL (ref 78.0–100.0)
MONO ABS: 1.3 10*3/uL — AB (ref 0.1–1.0)
MONOS PCT: 9 % (ref 3–12)
NEUTROS ABS: 12.2 10*3/uL — AB (ref 1.7–7.7)
Neutrophils Relative %: 87 % — ABNORMAL HIGH (ref 43–77)
PLATELETS: 135 10*3/uL — AB (ref 150–400)
RBC: 3.33 MIL/uL — ABNORMAL LOW (ref 3.87–5.11)
RDW: 14.8 % (ref 11.5–15.5)
WBC: 14.1 10*3/uL — ABNORMAL HIGH (ref 4.0–10.5)

## 2015-07-07 LAB — URINALYSIS, ROUTINE W REFLEX MICROSCOPIC
Glucose, UA: NEGATIVE mg/dL
KETONES UR: NEGATIVE mg/dL
NITRITE: POSITIVE — AB
PROTEIN: 30 mg/dL — AB
SPECIFIC GRAVITY, URINE: 1.015 (ref 1.005–1.030)
UROBILINOGEN UA: 1 mg/dL (ref 0.0–1.0)
pH: 6.5 (ref 5.0–8.0)

## 2015-07-07 LAB — LACTIC ACID, PLASMA: LACTIC ACID, VENOUS: 1.6 mmol/L (ref 0.5–2.0)

## 2015-07-07 LAB — URINE MICROSCOPIC-ADD ON

## 2015-07-07 LAB — APTT: aPTT: 31 seconds (ref 24–37)

## 2015-07-07 LAB — I-STAT CG4 LACTIC ACID, ED: LACTIC ACID, VENOUS: 1.45 mmol/L (ref 0.5–2.0)

## 2015-07-07 LAB — PROCALCITONIN: PROCALCITONIN: 23.49 ng/mL

## 2015-07-07 LAB — PROTIME-INR
INR: 1.38 (ref 0.00–1.49)
Prothrombin Time: 17.1 seconds — ABNORMAL HIGH (ref 11.6–15.2)

## 2015-07-07 MED ORDER — VANCOMYCIN HCL IN DEXTROSE 1-5 GM/200ML-% IV SOLN: 1000.0000 mg | INTRAVENOUS | Status: DC

## 2015-07-07 MED ORDER — ONDANSETRON HCL 4 MG PO TABS: 4.0000 mg | ORAL_TABLET | Freq: Four times a day (QID) | ORAL | Status: DC | PRN

## 2015-07-07 MED ORDER — METHOCARBAMOL 500 MG PO TABS
500.0000 mg | ORAL_TABLET | Freq: Three times a day (TID) | ORAL | Status: DC | PRN
  Administered 2015-07-07: 500 mg via ORAL
  Filled 2015-07-07: qty 1

## 2015-07-07 MED ORDER — SODIUM CHLORIDE 0.9 % IV SOLN
INTRAVENOUS | Status: DC
  Administered 2015-07-07: 125 mL/h via INTRAVENOUS
  Administered 2015-07-08 – 2015-07-09 (×3): via INTRAVENOUS

## 2015-07-07 MED ORDER — SODIUM CHLORIDE 0.9 % IV BOLUS (SEPSIS)
500.0000 mL | Freq: Once | INTRAVENOUS | Status: AC
  Administered 2015-07-07: 500 mL via INTRAVENOUS

## 2015-07-07 MED ORDER — SODIUM CHLORIDE 0.9 % IJ SOLN
3.0000 mL | Freq: Two times a day (BID) | INTRAMUSCULAR | Status: DC
  Administered 2015-07-07 – 2015-07-12 (×6): 3 mL via INTRAVENOUS

## 2015-07-07 MED ORDER — HEPARIN SODIUM (PORCINE) 5000 UNIT/ML IJ SOLN: 5000.0000 [IU] | Freq: Three times a day (TID) | INTRAMUSCULAR | Status: DC

## 2015-07-07 MED ORDER — PIPERACILLIN-TAZOBACTAM 3.375 G IVPB: 3.3750 g | Freq: Three times a day (TID) | INTRAVENOUS | Status: DC

## 2015-07-07 MED ORDER — LEVOFLOXACIN IN D5W 750 MG/150ML IV SOLN
750.0000 mg | INTRAVENOUS | Status: DC
  Administered 2015-07-09: 750 mg via INTRAVENOUS
  Filled 2015-07-07: qty 150

## 2015-07-07 MED ORDER — MINOCYCLINE HCL 50 MG PO CAPS
50.0000 mg | ORAL_CAPSULE | Freq: Every day | ORAL | Status: DC
  Administered 2015-07-08 – 2015-07-11 (×4): 50 mg via ORAL
  Filled 2015-07-07 (×6): qty 1

## 2015-07-07 MED ORDER — SODIUM CHLORIDE 0.9 % IV BOLUS (SEPSIS)
500.0000 mL | INTRAVENOUS | Status: AC
  Administered 2015-07-07: 500 mL via INTRAVENOUS

## 2015-07-07 MED ORDER — ONDANSETRON HCL 4 MG/2ML IJ SOLN
4.0000 mg | Freq: Four times a day (QID) | INTRAMUSCULAR | Status: DC | PRN
  Administered 2015-07-07: 4 mg via INTRAVENOUS
  Filled 2015-07-07: qty 2

## 2015-07-07 MED ORDER — DEXTROSE 5 % IV SOLN
2.0000 g | Freq: Once | INTRAVENOUS | Status: AC
  Administered 2015-07-07: 2 g via INTRAVENOUS
  Filled 2015-07-07: qty 2

## 2015-07-07 MED ORDER — LEVOFLOXACIN IN D5W 750 MG/150ML IV SOLN
750.0000 mg | Freq: Once | INTRAVENOUS | Status: AC
  Administered 2015-07-07: 750 mg via INTRAVENOUS
  Filled 2015-07-07: qty 150

## 2015-07-07 MED ORDER — PIPERACILLIN-TAZOBACTAM 3.375 G IVPB 30 MIN
3.3750 g | Freq: Once | INTRAVENOUS | Status: AC
  Administered 2015-07-07: 3.375 g via INTRAVENOUS
  Filled 2015-07-07: qty 50

## 2015-07-07 MED ORDER — VANCOMYCIN HCL IN DEXTROSE 1-5 GM/200ML-% IV SOLN
1000.0000 mg | Freq: Once | INTRAVENOUS | Status: AC
  Administered 2015-07-07: 1000 mg via INTRAVENOUS
  Filled 2015-07-07 (×2): qty 200

## 2015-07-07 MED ORDER — SODIUM CHLORIDE 0.9 % IV BOLUS (SEPSIS)
1000.0000 mL | INTRAVENOUS | Status: AC
  Administered 2015-07-07: 500 mL via INTRAVENOUS
  Administered 2015-07-07: 1000 mL via INTRAVENOUS

## 2015-07-07 NOTE — ED Notes (Signed)
Pt from home, with home health nurse. Nurse states it was reported to her that the pt vomited twice last night, and that her foley is draining dark/red tinged urine. Pt complaining of some nausea, some mid abdominal pain. Temp is 98.9, pt feels warm to touch. BP is low in triage (78/37), pt not complaining of any dizziness or increased weakness. Pt wheelchair bound d/t weakness to right leg.

## 2015-07-07 NOTE — ED Notes (Signed)
Bed: WA07 Expected date:  Expected time:  Means of arrival:  Comments: Tr 1

## 2015-07-07 NOTE — Progress Notes (Addendum)
ANTIBIOTIC CONSULT NOTE - INITIAL  Pharmacy Consult for Vancomycin, Zosyn Indication: rule out sepsis  Allergies  Allergen Reactions  . Shellfish Allergy     Patient Measurements:   Adjusted Body Weight:   Vital Signs: Temp: 98.9 F (37.2 C) (09/03 1720) Temp Source: Oral (09/03 1720) BP: 78/37 mmHg (09/03 1720) Pulse Rate: 62 (09/03 1720) Intake/Output from previous day:   Intake/Output from this shift:    Labs: No results for input(s): WBC, HGB, PLT, LABCREA, CREATININE in the last 72 hours. CrCl cannot be calculated (Unknown ideal weight.). No results for input(s): VANCOTROUGH, VANCOPEAK, VANCORANDOM, GENTTROUGH, GENTPEAK, GENTRANDOM, TOBRATROUGH, TOBRAPEAK, TOBRARND, AMIKACINPEAK, AMIKACINTROU, AMIKACIN in the last 72 hours.   Microbiology: No results found for this or any previous visit (from the past 720 hour(s)).  Medical History: Past Medical History  Diagnosis Date  . Hypertension   . Cancer   . High cholesterol     Assessment: 38 yoF presents from home with reports of vomiting, dark/red tinged urine from indwelling foley, abdominal pain.  Found to be hypotensive.  Pharmacy consulted to dose vancomycin and zosyn for r/o sepsis.  No fever, no labs yet.  Vancomycin 1g and Zosyn 3.375g ordered x 1 in ED.  Goal of Therapy:  Vancomycin trough level 15-20 mcg/ml  Doses adjusted per renal function Eradication of infection  Plan:  F/u SCr to determine appropriate maintenance dosing.  Hershal Coria 07/07/2015,6:08 PM  Addendum: 07/07/2015 7:32 PM SCr 1.86, CrCl~32 ml/min (CG), ~25 ml/min (normalized) Weight 90.7 kg Plan: Vancomycin 1g IV q24h Zosyn 3.375g IV q8h (4 hour infusion time).  Hershal Coria, PharmD, BCPS Pager: [phone] 07/07/2015 7:34 PM

## 2015-07-07 NOTE — ED Notes (Signed)
Called to give report. Secretary stated the nurse will call back.

## 2015-07-07 NOTE — ED Provider Notes (Signed)
CSN: [id number]     Arrival date & time 07/07/15  1700 History   First MD Initiated Contact with Patient 07/07/15 1732     Chief Complaint  Patient presents with  . Hypotension  . Abdominal Pain  . Hematuria     (Consider location/radiation/quality/duration/timing/severity/associated sxs/prior Treatment) HPI Comments: Home health nurse noticed dark urine today. Hypotensive in triage.  Patient is a 81 y.o. female presenting with abdominal pain, hematuria, and weakness. The history is provided by the patient.  Abdominal Pain Associated symptoms: hematuria and vomiting (x2 last night)   Associated symptoms: no cough, no fever and no shortness of breath   Hematuria Associated symptoms include abdominal pain (last night with 2 episodes of vomiting). Pertinent negatives include no shortness of breath.  Weakness This is a new problem. The current episode started 3 to 5 hours ago. The problem occurs constantly. The problem has not changed since onset.Associated symptoms include abdominal pain (last night with 2 episodes of vomiting). Pertinent negatives include no shortness of breath. Nothing aggravates the symptoms. Nothing relieves the symptoms.    Past Medical History  Diagnosis Date  . Hypertension   . Cancer   . High cholesterol    Past Surgical History  Procedure Laterality Date  . Mastectomy Right    History reviewed. No pertinent family history. Social History  Substance Use Topics  . Smoking status: Never Smoker   . Smokeless tobacco: None  . Alcohol Use: None   OB History    No data available     Review of Systems  Constitutional: Negative for fever.  Respiratory: Negative for cough and shortness of breath.   Gastrointestinal: Positive for vomiting (x2 last night) and abdominal pain (last night with 2 episodes of vomiting).  Genitourinary: Positive for hematuria.  Neurological: Positive for weakness.  All other systems reviewed and are negative.     Allergies   Shellfish allergy  Home Medications   Prior to Admission medications   Medication Sig Start Date End Date Taking? Authorizing Provider  albuterol (PROVENTIL HFA;VENTOLIN HFA) 108 (90 BASE) MCG/ACT inhaler Inhale 2 puffs into the lungs every 4 (four) hours as needed for wheezing or shortness of breath (cough, shortness of breath or wheezing.). 01/17/14   Roselee Culver, MD  fluticasone Asencion Islam) 50 MCG/ACT nasal spray  07/27/12   Historical Provider, MD  furosemide (LASIX) 40 MG tablet Take 40 mg by mouth 2 (two) times daily.    Historical Provider, MD  imatinib (GLEEVEC) 100 MG tablet Take 100 mg by mouth daily. Take with meals and large glass of water.Caution:Chemotherapy    Historical Provider, MD  losartan (COZAAR) 50 MG tablet Take 50 mg by mouth daily.    Historical Provider, MD  methocarbamol (ROBAXIN) 500 MG tablet Take 500 mg by mouth 4 (four) times daily.    Historical Provider, MD  potassium chloride SA (K-DUR,KLOR-CON) 20 MEQ tablet  08/04/12   Historical Provider, MD  Spacer/Aero-Holding Dorise Bullion Use with MDI as directed 01/17/14   Roselee Culver, MD  sulfamethoxazole-trimethoprim (BACTRIM DS) 800-160 MG per tablet Take 1 tablet by mouth 2 (two) times daily. 08/16/12   Roselee Culver, MD  traMADol (ULTRAM) 50 MG tablet Take 50 mg by mouth every 6 (six) hours as needed.    Historical Provider, MD  vitamin B-12 (CYANOCOBALAMIN) 1000 MCG tablet Take 1,000 mcg by mouth daily.    Historical Provider, MD   BP 78/37 mmHg  Pulse 62  Temp(Src) 98.9 F (37.2 C) (  Oral)  Resp 20  SpO2 98% Physical Exam  Constitutional: She is oriented to person, place, and time. She appears well-developed and well-nourished. No distress.  HENT:  Head: Normocephalic and atraumatic.  Mouth/Throat: Oropharynx is clear and moist.  Eyes: EOM are normal. Pupils are equal, round, and reactive to light.  Neck: Normal range of motion. Neck supple.  Cardiovascular: Normal rate and regular rhythm.   Exam reveals no friction rub.   No murmur heard. Pulmonary/Chest: Effort normal and breath sounds normal. No respiratory distress. She has no wheezes. She has no rales.  Abdominal: Soft. She exhibits no distension. There is no tenderness. There is no rebound.  Musculoskeletal: Normal range of motion. She exhibits no edema.  Neurological: She is alert and oriented to person, place, and time. No cranial nerve deficit. She exhibits normal muscle tone. Coordination normal.  Skin: No rash noted. She is not diaphoretic.  Nursing note and vitals reviewed.   ED Course  Procedures (including critical care time) Labs Review Labs Reviewed  CULTURE, BLOOD (ROUTINE X 2)  CULTURE, BLOOD (ROUTINE X 2)  URINE CULTURE  COMPREHENSIVE METABOLIC PANEL  CBC WITH DIFFERENTIAL/PLATELET  URINALYSIS, ROUTINE W REFLEX MICROSCOPIC (NOT AT [facility])  I-STAT CG4 LACTIC ACID, ED  I-STAT CG4 LACTIC ACID, ED    Imaging Review Dg Chest Portable 1 View  07/07/2015   CLINICAL DATA:  Mid abdominal pain.  Vomiting.  EXAM: PORTABLE CHEST - 1 VIEW  COMPARISON:  None.  FINDINGS: Enlarged cardiac silhouette. Clear lungs. Right diaphragmatic eventration. Bilateral shoulder degenerative changes. Superior migration of the right humeral head compatible with a large, chronic rotator cuff tear.  IMPRESSION: No acute abnormality.  Cardiomegaly.   Electronically Signed   By: Claudie Revering M.D.   On: 07/07/2015 19:48   US Abdomen Limited Ruq  07/07/2015   CLINICAL DATA:  Right upper quadrant pain, nausea and vomiting.  EXAM: US ABDOMEN LIMITED - RIGHT UPPER QUADRANT  COMPARISON:  None.  FINDINGS: Gallbladder:  Multiple calculi are visible in the gallbladder lumen. There is no gallbladder wall thickening or pericholecystic fluid. The patient was not tender over the gallbladder.  Common bile duct:  Diameter: Normal, 6.5 mm.  Liver:  No focal lesion identified. Within normal limits in parenchymal echogenicity.  IMPRESSION: Cholelithiasis without  sonographic evidence of cholecystitis.   Electronically Signed   By: Andreas Newport M.D.   On: 07/07/2015 21:21   I have personally reviewed and evaluated these images and lab results as part of my medical decision-making.   EKG Interpretation   Date/Time:  Saturday July 07 2015 18:59:04 EDT Ventricular Rate:  71 PR Interval:  181 QRS Duration: 97 QT Interval:  464 QTC Calculation: 504 R Axis:   -46 Text Interpretation:  Sinus rhythm Paired ventricular premature complexes  LAD, consider left anterior fascicular block Low voltage, precordial leads  RSR' in V1 or V2, right VCD or RVH Abnormal T, consider ischemia, lateral  leads ST elevation, consider inferior injury Inferior changes are new. No  STEMI criteria Confirmed by Mingo Amber  MD, [place] ([id number]) on 07/07/2015 7:11:34  PM     CRITICAL CARE Performed by: Osvaldo Shipper   Total critical care time: 30 minutes  Critical care time was exclusive of separately billable procedures and treating other patients.  Critical care was necessary to treat or prevent imminent or life-threatening deterioration.  Critical care was time spent personally by me on the following activities: development of treatment plan with patient and/or surrogate as well  as nursing, discussions with consultants, evaluation of patient's response to treatment, examination of patient, obtaining history from patient or surrogate, ordering and performing treatments and interventions, ordering and review of laboratory studies, ordering and review of radiographic studies, pulse oximetry and re-evaluation of patient's condition.  MDM   Final diagnoses:  Vomiting  UTI (lower urinary tract infection)  Hypotension, unspecified hypotension type    51F here with hypotension. Her home health nurse noticed she had dark urine today. Hx of an indwelling foley, it was recently changed this past week. Associated vomiting last night x 2 with abdominal pain. No vomiting  or abdominal pain today. Afebrile, hypotensive in the 70s here. She is alert and oriented. ED sepsis protocol initiated, broad spectrum antibiotics ordered. Will hydrate, recheck. BP improving. UTI noted. Admitted.   Evelina Bucy, MD 07/07/15 [phone]

## 2015-07-07 NOTE — ED Notes (Signed)
Pt was encourage to come d/t hematuria and dk color urine in F/C. Pt denies pain.

## 2015-07-08 ENCOUNTER — Encounter (HOSPITAL_COMMUNITY): Payer: Self-pay | Admitting: Internal Medicine

## 2015-07-08 DIAGNOSIS — N2889 Other specified disorders of kidney and ureter: Secondary | ICD-10-CM

## 2015-07-08 DIAGNOSIS — G309 Alzheimer's disease, unspecified: Secondary | ICD-10-CM

## 2015-07-08 DIAGNOSIS — D630 Anemia in neoplastic disease: Secondary | ICD-10-CM | POA: Diagnosis present

## 2015-07-08 DIAGNOSIS — N179 Acute kidney failure, unspecified: Secondary | ICD-10-CM | POA: Diagnosis present

## 2015-07-08 DIAGNOSIS — R740 Nonspecific elevation of levels of transaminase and lactic acid dehydrogenase [LDH]: Secondary | ICD-10-CM

## 2015-07-08 DIAGNOSIS — N390 Urinary tract infection, site not specified: Secondary | ICD-10-CM | POA: Diagnosis present

## 2015-07-08 DIAGNOSIS — R7401 Elevation of levels of liver transaminase levels: Secondary | ICD-10-CM | POA: Diagnosis present

## 2015-07-08 DIAGNOSIS — D696 Thrombocytopenia, unspecified: Secondary | ICD-10-CM

## 2015-07-08 DIAGNOSIS — F0280 Dementia in other diseases classified elsewhere without behavioral disturbance: Secondary | ICD-10-CM | POA: Diagnosis present

## 2015-07-08 DIAGNOSIS — C787 Secondary malignant neoplasm of liver and intrahepatic bile duct: Secondary | ICD-10-CM | POA: Diagnosis present

## 2015-07-08 DIAGNOSIS — I959 Hypotension, unspecified: Secondary | ICD-10-CM | POA: Diagnosis present

## 2015-07-08 DIAGNOSIS — K8020 Calculus of gallbladder without cholecystitis without obstruction: Secondary | ICD-10-CM | POA: Diagnosis present

## 2015-07-08 DIAGNOSIS — R253 Fasciculation: Secondary | ICD-10-CM | POA: Diagnosis present

## 2015-07-08 DIAGNOSIS — Z960 Presence of urogenital implants: Secondary | ICD-10-CM

## 2015-07-08 DIAGNOSIS — C49A0 Gastrointestinal stromal tumor, unspecified site: Secondary | ICD-10-CM

## 2015-07-08 DIAGNOSIS — Z993 Dependence on wheelchair: Secondary | ICD-10-CM

## 2015-07-08 DIAGNOSIS — R258 Other abnormal involuntary movements: Secondary | ICD-10-CM

## 2015-07-08 DIAGNOSIS — Z978 Presence of other specified devices: Secondary | ICD-10-CM

## 2015-07-08 DIAGNOSIS — D689 Coagulation defect, unspecified: Secondary | ICD-10-CM | POA: Diagnosis present

## 2015-07-08 DIAGNOSIS — R29810 Facial weakness: Secondary | ICD-10-CM | POA: Diagnosis present

## 2015-07-08 HISTORY — DX: Dementia in other diseases classified elsewhere, unspecified severity, without behavioral disturbance, psychotic disturbance, mood disturbance, and anxiety: F02.80

## 2015-07-08 HISTORY — DX: Secondary malignant neoplasm of liver and intrahepatic bile duct: C78.7

## 2015-07-08 HISTORY — DX: Dependence on wheelchair: Z99.3

## 2015-07-08 HISTORY — DX: Gastrointestinal stromal tumor, unspecified site: C49.A0

## 2015-07-08 HISTORY — DX: Presence of other specified devices: Z97.8

## 2015-07-08 HISTORY — DX: Other specified disorders of kidney and ureter: N28.89

## 2015-07-08 LAB — COMPREHENSIVE METABOLIC PANEL
ALBUMIN: 2.9 g/dL — AB (ref 3.5–5.0)
ALT: 395 U/L — ABNORMAL HIGH (ref 14–54)
AST: 323 U/L — AB (ref 15–41)
Alkaline Phosphatase: 184 U/L — ABNORMAL HIGH (ref 38–126)
Anion gap: 6 (ref 5–15)
BUN: 27 mg/dL — AB (ref 6–20)
CHLORIDE: 106 mmol/L (ref 101–111)
CO2: 23 mmol/L (ref 22–32)
Calcium: 7.7 mg/dL — ABNORMAL LOW (ref 8.9–10.3)
Creatinine, Ser: 1.5 mg/dL — ABNORMAL HIGH (ref 0.44–1.00)
GFR calc Af Amer: 36 mL/min — ABNORMAL LOW (ref >60)
GFR calc non Af Amer: 31 mL/min — ABNORMAL LOW (ref >60)
GLUCOSE: 107 mg/dL — AB (ref 65–99)
POTASSIUM: 4.2 mmol/L (ref 3.5–5.1)
SODIUM: 135 mmol/L (ref 135–145)
Total Bilirubin: 2.5 mg/dL — ABNORMAL HIGH (ref 0.3–1.2)
Total Protein: 5.4 g/dL — ABNORMAL LOW (ref 6.5–8.1)

## 2015-07-08 LAB — PROTIME-INR
INR: 1.4 (ref 0.00–1.49)
Prothrombin Time: 17.2 seconds — ABNORMAL HIGH (ref 11.6–15.2)

## 2015-07-08 LAB — CBC WITH DIFFERENTIAL/PLATELET
BASOS ABS: 0 10*3/uL (ref 0.0–0.1)
BASOS PCT: 0 % (ref 0–1)
EOS ABS: 0.1 10*3/uL (ref 0.0–0.7)
Eosinophils Relative: 1 % (ref 0–5)
HEMATOCRIT: 30.3 % — AB (ref 36.0–46.0)
Hemoglobin: 9.7 g/dL — ABNORMAL LOW (ref 12.0–15.0)
Lymphocytes Relative: 4 % — ABNORMAL LOW (ref 12–46)
Lymphs Abs: 0.4 10*3/uL — ABNORMAL LOW (ref 0.7–4.0)
MCH: 30.8 pg (ref 26.0–34.0)
MCHC: 32 g/dL (ref 30.0–36.0)
MCV: 96.2 fL (ref 78.0–100.0)
MONO ABS: 1 10*3/uL (ref 0.1–1.0)
Monocytes Relative: 10 % (ref 3–12)
NEUTROS ABS: 8.3 10*3/uL — AB (ref 1.7–7.7)
Neutrophils Relative %: 85 % — ABNORMAL HIGH (ref 43–77)
PLATELETS: 123 10*3/uL — AB (ref 150–400)
RBC: 3.15 MIL/uL — ABNORMAL LOW (ref 3.87–5.11)
RDW: 15.1 % (ref 11.5–15.5)
WBC: 9.8 10*3/uL (ref 4.0–10.5)

## 2015-07-08 LAB — MRSA PCR SCREENING: MRSA BY PCR: NEGATIVE

## 2015-07-08 MED ORDER — DEXTROSE 5 % IV SOLN
1.0000 g | Freq: Three times a day (TID) | INTRAVENOUS | Status: DC
  Administered 2015-07-08: 1 g via INTRAVENOUS
  Filled 2015-07-08: qty 1

## 2015-07-08 MED ORDER — ACETAMINOPHEN 325 MG PO TABS: 650.0000 mg | ORAL_TABLET | ORAL | Status: DC | PRN

## 2015-07-08 MED ORDER — SODIUM CHLORIDE 0.9 % IV BOLUS (SEPSIS)
500.0000 mL | Freq: Once | INTRAVENOUS | Status: AC
  Administered 2015-07-08: 500 mL via INTRAVENOUS

## 2015-07-08 MED ORDER — IBUPROFEN 200 MG PO TABS
400.0000 mg | ORAL_TABLET | Freq: Four times a day (QID) | ORAL | Status: DC | PRN
  Administered 2015-07-08: 400 mg via ORAL
  Filled 2015-07-08: qty 2

## 2015-07-08 MED ORDER — ENOXAPARIN SODIUM 40 MG/0.4ML ~~LOC~~ SOLN
40.0000 mg | SUBCUTANEOUS | Status: DC
  Administered 2015-07-08 – 2015-07-12 (×5): 40 mg via SUBCUTANEOUS
  Filled 2015-07-08 (×5): qty 0.4

## 2015-07-08 MED ORDER — SODIUM CHLORIDE 0.9 % IV SOLN
2.0000 g | Freq: Once | INTRAVENOUS | Status: AC
  Administered 2015-07-08: 2 g via INTRAVENOUS
  Filled 2015-07-08: qty 20

## 2015-07-08 NOTE — H&P (Signed)
[facility] History and Physical  Patient: Melanie Trevino  MRN: [id number]  DOB: 28-Apr-1928  DOS: the patient was seen and examined on 07/07/2015 PCP: Horton Finer, MD  Referring physician: Dr. Mingo Amber Chief Complaint: hematuria and abdominal pain  HPI: Melanie Trevino is a 81 y.o. 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 The patient presents with complains of dark urine as well as abdominal pain and vomiting. The history was received from the caregiver.the patient has 24 7 caregiver assistance due to her memory The patient has history of GIST and follows up at [facility]. Recently her imatinib was switched from brand name to generic. Since last 2 days the patient has been having episodes of vomiting of nonbilious nonbloody vomitus. When the caregiver came to work today she has noted that the patient's urine was darker and look like blood. Patient was initially refusing to come to the hospital but later on after discussing with her son decided to come to ER. Patient at the time of my evaluation was complaining of abdominal pain which improved after repositioning. She denies having any nausea denies having any headache denies having any fever or chills no complaints of chest pain cough or shortness of breath. She denies having any fall or trauma or injury. She does have on and off loose watery stool without any active bleeding. The catheter has been present chronically. She denies having any other changes in her medications recently. On discussing with her son our phone the patient does have some memory issue for long-term although the patient does have good short-term memory.  The patient is coming from home.  At her baseline is wheelchair bound And is dependent for most of her ADL does not manages her medication on her own.  Review of Systems: as mentioned in  the history of present illness.  A comprehensive review of the other systems is negative.  Past Medical History  Diagnosis Date  . Hypertension   . Cancer   . High cholesterol   . Wheelchair bound 07/08/2015  . Renal mass 07/08/2015  . Liver metastasis 07/08/2015  . 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 Dementia of the Alzheimer's type 07/08/2015  . Chronic indwelling Foley catheter 07/08/2015  . Breast cancer 08/18/2012   Past Surgical History  Procedure Laterality Date  . Mastectomy Right    Social History:  reports that she has never smoked. She does not have any smokeless tobacco history on file. Her alcohol and drug histories are not on file.  Allergies  Allergen Reactions  . Shellfish Allergy Anaphylaxis  . Other Other (See Comments)    HORSE SERUMYDrug[Other] swelling6/09/2006 12:00:00 AM by Erin Hearing CPhT  . Amoxicillin Rash and Swelling    History reviewed. No pertinent family history.  Prior to Admission medications   Medication Sig Start Date End Date Taking? Authorizing Provider  albuterol (PROVENTIL HFA;VENTOLIN HFA) 108 (90 BASE) MCG/ACT inhaler Inhale 2 puffs into the lungs every 4 (four) hours as needed for wheezing or shortness of breath (cough, shortness of breath or wheezing.). 01/17/14  Yes Roselee Culver, MD  calcium carbonate (OS-CAL) 600 MG TABS tablet Take 600 mg by mouth 2 (two) times daily with a meal.   Yes Historical Provider, MD  docusate sodium (COLACE) 100 MG capsule Take 100 mg by mouth at bedtime.   Yes Historical Provider, MD  fluticasone (FLONASE) 50 MCG/ACT nasal spray Place  1 spray into both nostrils daily.  07/27/12  Yes Historical Provider, MD  furosemide (LASIX) 40 MG tablet Take 40 mg by mouth 2 (two) times daily.   Yes Historical Provider, MD  imatinib (GLEEVEC) 400 MG tablet Take 400 mg by mouth daily. Take with meals and large glass of water.Caution:Chemotherapy.   Yes Historical Provider, MD  losartan (COZAAR) 50 MG tablet  Take 50 mg by mouth 2 (two) times daily.    Yes Historical Provider, MD  methocarbamol (ROBAXIN) 500 MG tablet Take 500 mg by mouth every 8 (eight) hours as needed for muscle spasms.    Yes Historical Provider, MD  minocycline (DYNACIN) 50 MG tablet Take 50 mg by mouth at bedtime.   Yes Historical Provider, MD  potassium chloride SA (K-DUR,KLOR-CON) 20 MEQ tablet Take 40 mEq by mouth 2 (two) times daily.  08/04/12  Yes Historical Provider, MD  Spacer/Aero-Holding Dorise Bullion Use with MDI as directed 01/17/14  Yes Roselee Culver, MD  vitamin B-12 (CYANOCOBALAMIN) 1000 MCG tablet Take 1,000 mcg by mouth 2 (two) times daily.    Yes Historical Provider, MD    Physical Exam: Filed Vitals:   07/07/15 2145 07/07/15 2215 07/07/15 2345 07/08/15 0000  BP: 115/59 114/46 104/34   Pulse: 58 54 60   Temp:   98.8 F (37.1 C) 98.7 F (37.1 C)  TempSrc:   Oral Oral  Resp: 29 19 18    Height:   5\' 4"  (1.626 m)   Weight:   103.2 kg (227 lb 8.2 oz)   SpO2: 94% 98% 98%     General: Alert, Awake and Oriented to Time, Place and Person. Appear in mild distress Eyes: PERRL ENT: Oral Mucosa clear dry. Neck: no JVD Cardiovascular: S1 and S2 Present, no Murmur, Peripheral Pulses Present Respiratory: Bilateral Air entry equal and Decreased,  Clear to Auscultation, no Crackles, no wheezes Abdomen: Bowel Sound present, Soft and no tenderness Skin: no Rash Extremities: no Pedal edema, no calf tenderness Neurologic: Grossly no focal neuro deficit.  Labs on Admission:  CBC:  Recent Labs Lab 07/07/15 1839  WBC 14.1*  NEUTROABS 12.2*  HGB 10.4*  HCT 31.8*  MCV 95.5  PLT 135*    CMP     Component Value Date/Time   NA 134* 07/07/2015 1839   K 4.7 07/07/2015 1839   CL 102 07/07/2015 1839   CO2 28 07/07/2015 1839   GLUCOSE 112* 07/07/2015 1839   BUN 31* 07/07/2015 1839   CREATININE 1.86* 07/07/2015 1839   CALCIUM 8.4* 07/07/2015 1839   PROT 5.9* 07/07/2015 1839   ALBUMIN 3.4* 07/07/2015 1839     AST 523* 07/07/2015 1839   ALT 568* 07/07/2015 1839   ALKPHOS 221* 07/07/2015 1839   BILITOT 3.2* 07/07/2015 1839   GFRNONAA 24* 07/07/2015 1839   GFRAA 28* 07/07/2015 1839    No results for input(s): LIPASE, AMYLASE in the last 168 hours.  No results for input(s): CKTOTAL, CKMB, CKMBINDEX, TROPONINI in the last 168 hours. BNP (last 3 results) No results for input(s): BNP in the last 8760 hours.  ProBNP (last 3 results) No results for input(s): PROBNP in the last 8760 hours.   Radiological Exams on Admission: Dg Chest Portable 1 View  07/07/2015   CLINICAL DATA:  Mid abdominal pain.  Vomiting.  EXAM: PORTABLE CHEST - 1 VIEW  COMPARISON:  None.  FINDINGS: Enlarged cardiac silhouette. Clear lungs. Right diaphragmatic eventration. Bilateral shoulder degenerative changes. Superior migration of the right humeral head compatible with  a large, chronic rotator cuff tear.  IMPRESSION: No acute abnormality.  Cardiomegaly.   Electronically Signed   By: Claudie Revering M.D.   On: 07/07/2015 19:48   US Abdomen Limited Ruq  07/07/2015   CLINICAL DATA:  Right upper quadrant pain, nausea and vomiting.  EXAM: US ABDOMEN LIMITED - RIGHT UPPER QUADRANT  COMPARISON:  None.  FINDINGS: Gallbladder:  Multiple calculi are visible in the gallbladder lumen. There is no gallbladder wall thickening or pericholecystic fluid. The patient was not tender over the gallbladder.  Common bile duct:  Diameter: Normal, 6.5 mm.  Liver:  No focal lesion identified. Within normal limits in parenchymal echogenicity.  IMPRESSION: Cholelithiasis without sonographic evidence of cholecystitis.   Electronically Signed   By: Andreas Newport M.D.   On: 07/07/2015 21:21   Assessment/Plan Principal Problem:   Sepsis Active Problems:   Breast cancer   Dementia of the Alzheimer's type   Wheelchair bound   Gastrointestinal stromal tumor (GIST)   Liver metastasis   Renal mass   Chronic indwelling Foley catheter   UTI (urinary tract  infection)   Transaminitis   AKI (acute kidney injury)   Cholelithiasis   1. Sepsis The patient is presenting with complaints of abdominal pain as well as a possible hematuria. She initially presented with hypotension, tachycardia, leukocytosis, acute kidney injury, elevated liver enzymes. Thus she is meeting the criteria for sepsis with a probable etiology of urinary tract infection. Ultrasound of the abdomen shows gallstones but no evidence of cholecystitis. With this the patient will be treated with broad-spectrum antibiotics due to her history of chemotherapy and cancer. She will be admitted in stepdown unit. I would give her 500 mL of bolus to complete a total of 3 L of bolus and then followed by normal saline at 1 25 mL per hour. Closely monitor ins and outs. Follow the cultures.  2.hematuria. It appears that her urine was darker and since her urine does not appear to have any hematuria at present. Closely monitor.  3.abdominal pain. Her ultrasound shows gallstone. Patient does have transaminitis. Ultrasound is negative for cholecystitis. Brief discussion with surgery recommends to watch her clinically and if there is no improvement in her LFT with treatment for sepsis or if she continues to have worsening abdominal pain recommended to reconsult in the morning for further workup and a possible HIDA scan.  4.acute kidney injury. Most likely in the setting of ongoing sepsis. Recheck in avoid nephrotoxic medication. Pharmacy consultation for renal dosing of antibiotics.  5.GIST, renal mass, breast cancer. The patient is currently on imatinib which does have side effect of hepatitic toxicity as well as nausea and vomiting. Patient was recently changed from brand to generic. Possibility of side effect cannot be ruled out. Currently holding it in the setting of ongoing sepsis.  Advance goals of care discussion: full code as per my discussion with patient's son over the  phone.  DVT Prophylaxis: subcutaneous Heparin Nutrition: clear liquid diet currently nothing by mouth after midnight.  Family Communication: discuss with the family over phone, opportunity was given to ask question and all questions were answered satisfactorily at the time of interview. Disposition: Admitted as inpatient, step-down unit.  Author: Berle Mull, MD [facility] Pager: [phone] 07/07/2015  If 7PM-7AM, please contact night-coverage [email or web address] Password [email or web address]

## 2015-07-08 NOTE — Progress Notes (Signed)
Utilization Review Completed.Kennth Vanbenschoten T9/02/2015  

## 2015-07-08 NOTE — Progress Notes (Signed)
Received transfer patient from ICU, oriented to unit, assessment and VS complete, call light with in reach

## 2015-07-08 NOTE — Progress Notes (Signed)
Progress Note   Melanie Trevino MBW:[id number] DOB: 08/11/1928 DOA: 07/07/2015 PCP: Horton Finer, MD   Brief Narrative:   Melanie Trevino is an 81 y.o. 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 She has a chronic indwelling urinary catheter.  Assessment/Plan:   Principal Problem:   Sepsis secondary to complicated UTI/chronic indwelling Foley catheter - Patient has a chronic indwelling Foley catheter. U/A positive for nitrites and a large amount of leukocytes with turbid urine. - Vigorously hydrated in the ED and placed on Levaquin, aztreonam and vancomycin. D/C vancomycin and aztreonam. - Pro calcitonin markedly elevated but serum lactate WNL. - Follow-up blood and urine cultures. Chest x-ray clear. - Hypotensive on admission, blood pressure now improved status post hydration. - Non-toxic appearing.  Transfer to floor.  Active Problems:   Right facial weakness / H/O Schwannoma / Facial twitching / Bilateral foot twitching / Hypocalcemia - H/O Schwannoma diagnosed by MRI 07/07/14.  Facial weakness longstanding - Get EEG, ? Focal seizures, versus hypocalcemia, will give 2 grams IV calcium gluconate.    History of hypertension - Blood pressure medications including Lasix, Cozaar on hold secondary to hypotension on admission.    Coagulopathy - INR is elevated. Possibly related to sepsis.    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    History of Breast cancer - Felt to be in remission.    Dementia of the Alzheimer's type/Wheelchair bound - Supportive care.    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    Renal mass - 2.2 cm left lower pole mass worrisome for renal cell carcinoma. - Saw urologist 01/15/15 and did not want aggressive treatment for this.  - A F/U CT done 06/26/15 showed slight increase in size. - She is scheduled to follow-up with Dr. Rosana Hoes 07/23/15.    Transaminitis/cholelithiasis - S/P RUQ abdominal ultrasound which showed cholelithiasis without sonographic evidence of cholecystitis. - Holding Imatinib in case elevated LFTs related to liver toxicity. - LFTs improving with hydration, questionable shock liver.    AKI (acute kidney injury) - Secondary to GI issues with nausea/vomiting, dehydration and sepsis. - Creatinine improving with hydration. Baseline creatinine 1.00.    DVT Prophylaxis - Lovenox ordered.  Family Communication: Caregiver at bedside. Melanie Trevino (son) also updated at bedside (returned to room when he was available). Disposition Plan: Home when stable & culture data back, likely another 1-2 days. Has 24 hour caregiver. Code Status:     Code Status Orders        Start     Ordered   07/07/15 2154  Full code   Continuous     07/07/15 2154      IV Access:    Peripheral IV   Procedures and diagnostic studies:   Dg Chest Portable 1 View  07/07/2015   CLINICAL DATA:  Mid abdominal pain.  Vomiting.  EXAM: PORTABLE CHEST - 1 VIEW  COMPARISON:  None.  FINDINGS: Enlarged cardiac silhouette. Clear lungs. Right diaphragmatic eventration. Bilateral shoulder degenerative changes. Superior migration of the right humeral head compatible with a large, chronic rotator cuff tear.  IMPRESSION: No acute abnormality.  Cardiomegaly.   Electronically Signed  By: Claudie Revering M.D.   On: 07/07/2015 19:48   US Abdomen Limited Ruq  07/07/2015   CLINICAL DATA:  Right upper quadrant pain, nausea and vomiting.  EXAM: US ABDOMEN LIMITED - RIGHT UPPER QUADRANT  COMPARISON:  None.  FINDINGS: Gallbladder:  Multiple calculi are visible in the  gallbladder lumen. There is no gallbladder wall thickening or pericholecystic fluid. The patient was not tender over the gallbladder.  Common bile duct:  Diameter: Normal, 6.5 mm.  Liver:  No focal lesion identified. Within normal limits in parenchymal echogenicity.  IMPRESSION: Cholelithiasis without sonographic evidence of cholecystitis.   Electronically Signed   By: Andreas Newport M.D.   On: 07/07/2015 21:21     Medical Consultants:    None.  Anti-Infectives:    Vancomycin 07/07/15---> 07/08/15  Levaquin 07/07/15--->  Aztreonam 07/07/15---> 07/08/15  Subjective:   Melanie Trevino feels okay.  Says she has numbness on the chin and right sided facial weakness (not new, related to Schwannoma), no abdominal pain/nausea or vomiting.  Complains of twitching in the feet and son has noted a muscle spasm in her chin that is new.  Objective:    Filed Vitals:   07/08/15 0345 07/08/15 0355 07/08/15 0400 07/08/15 0600  BP: 74/40 82/0 117/32 116/35  Pulse: 67 58 56 59  Temp:   98.1 F (36.7 C)   TempSrc:   Oral   Resp: 33 17 21 24   Height:      Weight:      SpO2: 100% 100% 100% 99%    Intake/Output Summary (Last 24 hours) at 07/08/15 0712 Last data filed at 07/08/15 0600  Gross per 24 hour  Intake 4641.67 ml  Output    875 ml  Net 3766.67 ml   Filed Weights   07/07/15 1906 07/07/15 2345  Weight: 90.719 kg (200 lb) 103.2 kg (227 lb 8.2 oz)    Exam: Gen:  NAD Neuro: Right facial weakness Cardiovascular:  RRR, No M/R/G Respiratory:  Lungs CTAB Gastrointestinal:  Abdomen soft, NT/ND, + BS Extremities:  No C/E/C, bilateral foot muscle twitches   Data Reviewed:    Labs: Basic Metabolic Panel:  Recent Labs Lab 07/07/15 1839 07/08/15 0339  NA 134* 135  K 4.7 4.2  CL 102 106  CO2 28 23  GLUCOSE 112* 107*  BUN 31* 27*  CREATININE 1.86* 1.50*  CALCIUM 8.4* 7.7*   GFR Estimated Creatinine Clearance: 32.7 mL/min (by C-G formula based on Cr of 1.5). Liver Function  Tests:  Recent Labs Lab 07/07/15 1839 07/08/15 0339  AST 523* 323*  ALT 568* 395*  ALKPHOS 221* 184*  BILITOT 3.2* 2.5*  PROT 5.9* 5.4*  ALBUMIN 3.4* 2.9*   Coagulation profile  Recent Labs Lab 07/07/15 2227 07/08/15 0339  INR 1.38 1.40    CBC:  Recent Labs Lab 07/07/15 1839 07/08/15 0339  WBC 14.1* 9.8  NEUTROABS 12.2* 8.3*  HGB 10.4* 9.7*  HCT 31.8* 30.3*  MCV 95.5 96.2  PLT 135* 123*   Sepsis Labs:  Recent Labs Lab 07/07/15 1839 07/07/15 1848 07/07/15 2227 07/08/15 0339  PROCALCITON  --   --  23.49  --   WBC 14.1*  --   --  9.8  LATICACIDVEN  --  1.45 1.6  --    Microbiology Recent Results (from the past 240 hour(s))  MRSA PCR Screening     Status: None   Collection Time: 07/07/15 11:43 PM  Result Value Ref Range Status   MRSA by PCR NEGATIVE  NEGATIVE Final    Comment:        The GeneXpert MRSA Assay (FDA approved for NASAL specimens only), is one component of a comprehensive MRSA colonization surveillance program. It is not intended to diagnose MRSA infection nor to guide or monitor treatment for MRSA infections.      Medications:   . aztreonam  1 g Intravenous 3 times per day  . heparin  5,000 Units Subcutaneous 3 times per day  . [START ON 07/09/2015] levofloxacin (LEVAQUIN) IV  750 mg Intravenous Q48H  . minocycline  50 mg Oral QHS  . sodium chloride  3 mL Intravenous Q12H  . vancomycin  1,000 mg Intravenous Q24H   Continuous Infusions: . sodium chloride 125 mL/hr at 07/08/15 0600    Time spent: 35 minutes with > 50% of time discussing current diagnostic test results, clinical impression and plan of care.   LOS: 1 day   RAMA,CHRISTINA  [facility] Pager [phone]. If unable to reach me by pager, please call my cell phone at [phone].  *Please refer to [email or web address], password [email or web address] to get updated schedule on who will round on this patient, as hospitalists switch teams weekly. If 7PM-7AM, please contact night-coverage at  [email or web address], password [email or web address] for any overnight needs.  07/08/2015, 7:12 AM

## 2015-07-08 NOTE — Progress Notes (Signed)
ANTIBIOTIC CONSULT NOTE - INITIAL  Pharmacy Consult for Aztreonam, levofloxacin Indication: Sepsis  Allergies  Allergen Reactions  . Shellfish Allergy Anaphylaxis  . Other Other (See Comments)    HORSE SERUMYDrug[Other] swelling6/09/2006 12:00:00 AM by Erin Hearing CPhT  . Amoxicillin Rash and Swelling    Patient Measurements: Height: 5\' 4"  (162.6 cm) Weight: 227 lb 8.2 oz (103.2 kg) IBW/kg (Calculated) : 54.7 Adjusted Body Weight:   Vital Signs: Temp: 98.7 F (37.1 C) (09/04 0000) Temp Source: Oral (09/04 0000) BP: 117/32 mmHg (09/04 0400) Pulse Rate: 56 (09/04 0400) Intake/Output from previous day: 09/03 0701 - 09/04 0700 In: 3622.9 [I.V.:447.9; IV Piggyback:3175] Out: 475 [Urine:475] Intake/Output from this shift: Total I/O In: 3622.9 [I.V.:447.9; IV Piggyback:3175] Out: 475 [Urine:475]  Labs:  Recent Labs  07/07/15 1839 07/08/15 0339  WBC 14.1* 9.8  HGB 10.4* 9.7*  PLT 135* 123*  CREATININE 1.86*  --    Estimated Creatinine Clearance: 26.3 mL/min (by C-G formula based on Cr of 1.86). No results for input(s): VANCOTROUGH, VANCOPEAK, VANCORANDOM, GENTTROUGH, GENTPEAK, GENTRANDOM, TOBRATROUGH, TOBRAPEAK, TOBRARND, AMIKACINPEAK, AMIKACINTROU, AMIKACIN in the last 72 hours.   Microbiology: Recent Results (from the past 720 hour(s))  MRSA PCR Screening     Status: None   Collection Time: 07/07/15 11:43 PM  Result Value Ref Range Status   MRSA by PCR NEGATIVE NEGATIVE Final    Comment:        The GeneXpert MRSA Assay (FDA approved for NASAL specimens only), is one component of a comprehensive MRSA colonization surveillance program. It is not intended to diagnose MRSA infection nor to guide or monitor treatment for MRSA infections.     Medical History: Past Medical History  Diagnosis Date  . Hypertension   . Cancer   . High cholesterol   . Wheelchair bound 07/08/2015  . Renal mass 07/08/2015  . Liver metastasis 07/08/2015  . Gastrointestinal  stromal tumor (GIST) 07/08/2015  . Dementia of the Alzheimer's type 07/08/2015  . Chronic indwelling Foley catheter 07/08/2015  . Breast cancer 08/18/2012    Medications:  Anti-infectives    Start     Dose/Rate Route Frequency Ordered Stop   07/09/15 2200  levofloxacin (LEVAQUIN) IVPB 750 mg     750 mg 100 mL/hr over 90 Minutes Intravenous Every 48 hours 07/07/15 2203     07/08/15 1800  vancomycin (VANCOCIN) IVPB 1000 mg/200 mL premix     1,000 mg 200 mL/hr over 60 Minutes Intravenous Every 24 hours 07/07/15 1935     07/08/15 0600  aztreonam (AZACTAM) 1 g in dextrose 5 % 50 mL IVPB     1 g 100 mL/hr over 30 Minutes Intravenous 3 times per day 07/08/15 0442     07/08/15 0000  piperacillin-tazobactam (ZOSYN) IVPB 3.375 g  Status:  Discontinued     3.375 g 12.5 mL/hr over 240 Minutes Intravenous Every 8 hours 07/07/15 1935 07/07/15 2158   07/07/15 2215  levofloxacin (LEVAQUIN) IVPB 750 mg     750 mg 100 mL/hr over 90 Minutes Intravenous  Once 07/07/15 2203 07/08/15 0011   07/07/15 2215  aztreonam (AZACTAM) 2 g in dextrose 5 % 50 mL IVPB     2 g 100 mL/hr over 30 Minutes Intravenous  Once 07/07/15 2203 07/07/15 2300   07/07/15 2200  minocycline (MINOCIN,DYNACIN) capsule 50 mg     50 mg Oral Daily at bedtime 07/07/15 2154     07/07/15 1800  piperacillin-tazobactam (ZOSYN) IVPB 3.375 g     3.375 g 100  mL/hr over 30 Minutes Intravenous  Once 07/07/15 1750 07/07/15 1920   07/07/15 1800  vancomycin (VANCOCIN) IVPB 1000 mg/200 mL premix     1,000 mg 200 mL/hr over 60 Minutes Intravenous  Once 07/07/15 1750 07/07/15 2019     Assessment: Patient with sepsis.  MD d/c zosyn and add aztreonam/levofloxacin per pharmacy.  Pharmacy already following patient for vancomycin.  Goal of Therapy:  Aztreonam, levofloxacin dosed based on patient weight and renal function   Plan:  Follow up culture results  Aztreonam 2gm iv x1, then 1gm iv q8hr Levofloxacin 750mg  iv q48hr  Melanie Trevino  Melanie Trevino 07/08/2015,4:42 AM

## 2015-07-09 ENCOUNTER — Inpatient Hospital Stay (HOSPITAL_COMMUNITY)
Admit: 2015-07-09 | Discharge: 2015-07-09 | Disposition: A | Discharge status: Home / Self Care | Payer: Medicare Other | Attending: Internal Medicine | Admitting: Internal Medicine

## 2015-07-09 DIAGNOSIS — R7881 Bacteremia: Secondary | ICD-10-CM | POA: Diagnosis present

## 2015-07-09 DIAGNOSIS — N3000 Acute cystitis without hematuria: Secondary | ICD-10-CM

## 2015-07-09 LAB — URINE CULTURE

## 2015-07-09 LAB — BASIC METABOLIC PANEL
Anion gap: 5 (ref 5–15)
BUN: 26 mg/dL — AB (ref 6–20)
CALCIUM: 7.5 mg/dL — AB (ref 8.9–10.3)
CHLORIDE: 108 mmol/L (ref 101–111)
CO2: 21 mmol/L — AB (ref 22–32)
CREATININE: 1.18 mg/dL — AB (ref 0.44–1.00)
GFR calc non Af Amer: 41 mL/min — ABNORMAL LOW (ref >60)
GFR, EST AFRICAN AMERICAN: 48 mL/min — AB (ref >60)
GLUCOSE: 118 mg/dL — AB (ref 65–99)
Potassium: 3.4 mmol/L — ABNORMAL LOW (ref 3.5–5.1)
Sodium: 134 mmol/L — ABNORMAL LOW (ref 135–145)

## 2015-07-09 MED ORDER — CALCIUM CARBONATE 1250 (500 CA) MG PO TABS
1250.0000 mg | ORAL_TABLET | Freq: Every day | ORAL | Status: DC
  Administered 2015-07-09 – 2015-07-12 (×4): 1250 mg via ORAL
  Filled 2015-07-09 (×4): qty 1

## 2015-07-09 MED ORDER — POTASSIUM CHLORIDE IN NACL 40-0.9 MEQ/L-% IV SOLN
INTRAVENOUS | Status: DC
  Administered 2015-07-09 (×2): 75 mL/h via INTRAVENOUS
  Filled 2015-07-09 (×4): qty 1000

## 2015-07-09 MED ORDER — SODIUM CHLORIDE 0.9 % IV SOLN
2.0000 g | Freq: Once | INTRAVENOUS | Status: AC
  Administered 2015-07-09: 2 g via INTRAVENOUS
  Filled 2015-07-09: qty 20

## 2015-07-09 MED ORDER — POLYETHYLENE GLYCOL 3350 17 G PO PACK
17.0000 g | PACK | Freq: Every day | ORAL | Status: DC
  Administered 2015-07-09 – 2015-07-12 (×4): 17 g via ORAL
  Filled 2015-07-09 (×4): qty 1

## 2015-07-09 NOTE — Progress Notes (Signed)
EEG completed; results pending.    

## 2015-07-09 NOTE — Procedures (Signed)
History: 81 yo F with facial twitching, concern for seizures vs hypocalcemia  Sedation: none  Technique: This is a 19 channel routine scalp EEG performed at the bedside with bipolar and monopolar montages arranged in accordance to the international 10/20 system of electrode placement. One channel was dedicated to EKG recording.    Background: The background consists of intermixed alpha and beta activities. There is a well defined posterior dominant rhythm of 10 Hz that attenuates with eye opening. Sleep is recorded with normal appearing structures.   Photic stimulation: Physiologic driving is not performed  EEG Abnormalities: None  Clinical Interpretation: This normal EEG is recorded in the waking and sleeping state. There was no seizure or seizure predisposition recorded on this study. Please note that a normal EEG does not preclude the possibility of epilepsy.    Roland Rack, MD [facility] [phone]  If 7pm- 7am, please page neurology on call as listed in [place].

## 2015-07-09 NOTE — Progress Notes (Signed)
Progress Note   Melanie Trevino ERX:[id number] DOB: [birth year] DOA: 07/07/2015 PCP: Horton Finer, MD   Brief Narrative:   Melanie Trevino is an 81 y.o. 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 She has a chronic indwelling urinary catheter.  Assessment/Plan:   Principal Problem:   Sepsis secondary to complicated UTI/chronic indwelling Foley catheter/gram-negative rod bacteremia - Patient has a chronic indwelling Foley catheter. U/A positive for nitrites and a large amount of leukocytes with turbid urine. - Vigorously hydrated in the ED and placed on Levaquin, aztreonam and vancomycin.  - Vancomycin and aztreonam stopped 07/08/15. Levaquin continued. Source likely urinary. - Pro calcitonin markedly elevated but serum lactate WNL. - Follow-up final blood and urine cultures. Chest x-ray clear. - Hypotensive on admission, blood pressure now improved status post hydration.  Active Problems:   Right facial weakness / H/O Schwannoma / Facial twitching / Bilateral foot twitching / Hypocalcemia - H/O Schwannoma diagnosed by MRI 07/07/14.  Facial weakness longstanding - Get EEG, ? Focal seizures, versus hypocalcemia. - Given 2 g of calcium gluconate 07/08/15. Calcium still low. Repeat. We'll place on calcium supplementation orally as well.    History of hypertension - Blood pressure medications including Lasix, Cozaar on hold secondary to hypotension on admission.    Coagulopathy - INR is elevated. Possibly related to sepsis.    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    History of Breast cancer - Felt to be in  remission.    Dementia of the Alzheimer's type/Wheelchair bound - Supportive care.    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    Renal mass - 2.2 cm left lower pole mass worrisome for renal cell carcinoma. - Saw urologist 01/15/15 and did not want aggressive treatment for this.  - A F/U CT done 06/26/15 showed slight increase in size. - She is scheduled to follow-up with Dr. Rosana Hoes 07/23/15.    Transaminitis/cholelithiasis - S/P RUQ abdominal ultrasound which showed cholelithiasis without sonographic evidence of cholecystitis. - Holding Imatinib in case elevated LFTs related to liver toxicity. - LFTs improving with hydration, questionable shock liver. Repeat LFTs in the morning.    AKI (acute kidney injury) - Secondary to GI issues with nausea/vomiting, dehydration and sepsis. - Creatinine improving with hydration. Baseline creatinine 1.00.    Hypokalemia  - We'll add potassium to IV fluids.    DVT Prophylaxis - Lovenox ordered.  Family Communication: Caregiver at bedside. Sheronica Corey (son) also updated at bedside 07/08/15. Disposition Plan: Home when stable & culture data back, likely another 1-2 days. Has 24 hour caregiver. Code Status:     Code Status Orders        Start     Ordered   07/07/15 2154  Full code   Continuous     07/07/15 2154      IV Access:    Peripheral IV   Procedures and diagnostic studies:   Dg Chest Portable 1 View  07/07/2015   CLINICAL DATA:  Mid abdominal pain.  Vomiting.  EXAM: PORTABLE CHEST - 1 VIEW  COMPARISON:  None.  FINDINGS: Enlarged cardiac silhouette. Clear lungs. Right diaphragmatic eventration. Bilateral shoulder degenerative changes.  Superior migration of the right humeral head compatible with a large, chronic rotator cuff tear.  IMPRESSION: No acute abnormality.  Cardiomegaly.   Electronically Signed   By: Claudie Revering M.D.   On: 07/07/2015 19:48   US Abdomen Limited Ruq  07/07/2015    CLINICAL DATA:  Right upper quadrant pain, nausea and vomiting.  EXAM: US ABDOMEN LIMITED - RIGHT UPPER QUADRANT  COMPARISON:  None.  FINDINGS: Gallbladder:  Multiple calculi are visible in the gallbladder lumen. There is no gallbladder wall thickening or pericholecystic fluid. The patient was not tender over the gallbladder.  Common bile duct:  Diameter: Normal, 6.5 mm.  Liver:  No focal lesion identified. Within normal limits in parenchymal echogenicity.  IMPRESSION: Cholelithiasis without sonographic evidence of cholecystitis.   Electronically Signed   By: Andreas Newport M.D.   On: 07/07/2015 21:21     Medical Consultants:    None.  Anti-Infectives:    Vancomycin 07/07/15---> 07/08/15  Levaquin 07/07/15--->  Aztreonam 07/07/15---> 07/08/15  Subjective:   Melanie Trevino feels okay. Denies pain.  No further foot twitching.  No nausea or vomiting.  No dyspnea/cough.  Objective:    Filed Vitals:   07/08/15 1542 07/08/15 2135 07/08/15 2150 07/09/15 0459  BP: 116/44 107/38 110/52 114/42  Pulse: 58 62  52  Temp: 99.5 F (37.5 C) 98.6 F (37 C)  99.5 F (37.5 C)  TempSrc: Oral Oral  Oral  Resp:  16  16  Height:      Weight:    106.1 kg (233 lb 14.5 oz)  SpO2: 97% 97%  96%    Intake/Output Summary (Last 24 hours) at 07/09/15 0918 Last data filed at 07/09/15 4742  Gross per 24 hour  Intake   1950 ml  Output    400 ml  Net   1550 ml   Filed Weights   07/07/15 1906 07/07/15 2345 07/09/15 0459  Weight: 90.719 kg (200 lb) 103.2 kg (227 lb 8.2 oz) 106.1 kg (233 lb 14.5 oz)    Exam: Gen:  NAD Neuro: Right facial weakness Cardiovascular:  RRR, No M/R/G Respiratory:  Lungs CTAB Gastrointestinal:  Abdomen soft, NT/ND, reducible abdominal hernia, + BS Extremities:  No C/E/C, bilateral foot muscle twitches gone   Data Reviewed:    Labs: Basic Metabolic Panel:  Recent Labs Lab 07/07/15 1839 07/08/15 0339 07/09/15 0520  NA 134* 135 134*  K 4.7 4.2 3.4*  CL 102 106 108   CO2 28 23 21*  GLUCOSE 112* 107* 118*  BUN 31* 27* 26*  CREATININE 1.86* 1.50* 1.18*  CALCIUM 8.4* 7.7* 7.5*   GFR Estimated Creatinine Clearance: 42.2 mL/min (by C-G formula based on Cr of 1.18). Liver Function Tests:  Recent Labs Lab 07/07/15 1839 07/08/15 0339  AST 523* 323*  ALT 568* 395*  ALKPHOS 221* 184*  BILITOT 3.2* 2.5*  PROT 5.9* 5.4*  ALBUMIN 3.4* 2.9*   Coagulation profile  Recent Labs Lab 07/07/15 2227 07/08/15 0339  INR 1.38 1.40    CBC:  Recent Labs Lab 07/07/15 1839 07/08/15 0339  WBC 14.1* 9.8  NEUTROABS 12.2* 8.3*  HGB 10.4* 9.7*  HCT 31.8* 30.3*  MCV 95.5 96.2  PLT 135* 123*   Sepsis Labs:  Recent Labs Lab 07/07/15 1839 07/07/15 1848 07/07/15 2227 07/08/15 0339  PROCALCITON  --   --  23.49  --   WBC 14.1*  --   --  9.8  LATICACIDVEN  --  1.45 1.6  --  Microbiology Recent Results (from the past 240 hour(s))  Culture, blood (routine x 2)     Status: None (Preliminary result)   Collection Time: 07/07/15  6:35 PM  Result Value Ref Range Status   Specimen Description BLOOD LEFT ANTECUBITAL  Final   Special Requests BOTTLES DRAWN AEROBIC AND ANAEROBIC 5CC  Final   Culture  Setup Time   Final    GRAM NEGATIVE RODS AEROBIC BOTTLE ONLY CRITICAL RESULT CALLED TO, READ BACK BY AND VERIFIED WITH: I HABIB 07/08/15 @ 1340 M VESTAL    Culture   Final    GRAM NEGATIVE RODS Performed at [facility]    Report Status PENDING  Incomplete  Culture, blood (routine x 2)     Status: None (Preliminary result)   Collection Time: 07/07/15  6:35 PM  Result Value Ref Range Status   Specimen Description BLOOD LEFT WRIST  Final   Special Requests BOTTLES DRAWN AEROBIC AND ANAEROBIC 5CC EACH  Final   Culture   Final    NO GROWTH < 24 HOURS Performed at [facility]    Report Status PENDING  Incomplete  Urine culture     Status: None (Preliminary result)   Collection Time: 07/07/15  7:01 PM  Result Value Ref Range Status    Specimen Description URINE, CATHETERIZED  Final   Special Requests NONE  Final   Culture   Final    CULTURE REINCUBATED FOR BETTER GROWTH Performed at [facility]    Report Status PENDING  Incomplete  MRSA PCR Screening     Status: None   Collection Time: 07/07/15 11:43 PM  Result Value Ref Range Status   MRSA by PCR NEGATIVE NEGATIVE Final    Comment:        The GeneXpert MRSA Assay (FDA approved for NASAL specimens only), is one component of a comprehensive MRSA colonization surveillance program. It is not intended to diagnose MRSA infection nor to guide or monitor treatment for MRSA infections.      Medications:   . calcium carbonate  1,250 mg Oral Q breakfast  . calcium gluconate  2 g Intravenous Once  . enoxaparin (LOVENOX) injection  40 mg Subcutaneous Q24H  . levofloxacin (LEVAQUIN) IV  750 mg Intravenous Q48H  . minocycline  50 mg Oral QHS  . polyethylene glycol  17 g Oral Daily  . sodium chloride  3 mL Intravenous Q12H   Continuous Infusions: . 0.9 % NaCl with KCl 40 mEq / L 75 mL/hr (07/09/15 0853)    Time spent: 35 minutes with > 50% of time discussing current diagnostic test results, clinical impression and plan of care.   LOS: 2 days   Sotero Brinkmeyer  [facility] Pager [phone]. If unable to reach me by pager, please call my cell phone at [phone].  *Please refer to [email or web address], password [email or web address] to get updated schedule on who will round on this patient, as hospitalists switch teams weekly. If 7PM-7AM, please contact night-coverage at [email or web address], password [email or web address] for any overnight needs.  07/09/2015, 9:18 AM

## 2015-07-10 DIAGNOSIS — D630 Anemia in neoplastic disease: Secondary | ICD-10-CM

## 2015-07-10 DIAGNOSIS — D689 Coagulation defect, unspecified: Secondary | ICD-10-CM

## 2015-07-10 DIAGNOSIS — R7881 Bacteremia: Secondary | ICD-10-CM

## 2015-07-10 DIAGNOSIS — A419 Sepsis, unspecified organism: Principal | ICD-10-CM

## 2015-07-10 DIAGNOSIS — N390 Urinary tract infection, site not specified: Secondary | ICD-10-CM

## 2015-07-10 LAB — COMPREHENSIVE METABOLIC PANEL
ALBUMIN: 2.4 g/dL — AB (ref 3.5–5.0)
ALK PHOS: 216 U/L — AB (ref 38–126)
ALT: 182 U/L — AB (ref 14–54)
ANION GAP: 5 (ref 5–15)
AST: 99 U/L — ABNORMAL HIGH (ref 15–41)
BUN: 20 mg/dL (ref 6–20)
CALCIUM: 7.8 mg/dL — AB (ref 8.9–10.3)
CHLORIDE: 109 mmol/L (ref 101–111)
CO2: 17 mmol/L — AB (ref 22–32)
Creatinine, Ser: 0.92 mg/dL (ref 0.44–1.00)
GFR calc Af Amer: >60 mL/min (ref >60)
GFR calc non Af Amer: 56 mL/min — ABNORMAL LOW (ref >60)
GLUCOSE: 101 mg/dL — AB (ref 65–99)
Potassium: 4.9 mmol/L (ref 3.5–5.1)
SODIUM: 131 mmol/L — AB (ref 135–145)
Total Bilirubin: 0.3 mg/dL (ref 0.3–1.2)
Total Protein: 4.5 g/dL — ABNORMAL LOW (ref 6.5–8.1)

## 2015-07-10 MED ORDER — LEVOFLOXACIN IN D5W 750 MG/150ML IV SOLN
750.0000 mg | INTRAVENOUS | Status: DC
  Administered 2015-07-10 – 2015-07-11 (×2): 750 mg via INTRAVENOUS
  Filled 2015-07-10 (×3): qty 150

## 2015-07-10 MED ORDER — BISACODYL 10 MG RE SUPP
10.0000 mg | Freq: Every day | RECTAL | Status: DC | PRN
  Administered 2015-07-10: 10 mg via RECTAL
  Filled 2015-07-10: qty 1

## 2015-07-10 MED ORDER — SODIUM CHLORIDE 0.45 % IV SOLN
INTRAVENOUS | Status: DC
  Administered 2015-07-10: 17:00:00 via INTRAVENOUS

## 2015-07-10 NOTE — Progress Notes (Signed)
Patient Demographics   Melanie Trevino, is a 81 y.o. female, DOB - 01-07-1928, FIE:[id number] Admit date - 07/07/2015   Admitting Physician Lavina Hamman, MD Outpatient Primary MD for the patient is Horton Finer, MD LOS - 3    Assessment  & Plan :   Principal Problem:  Sepsis secondary to complicated UTI/chronic indwelling Foley catheter - Patient has a chronic indwelling Foley catheter. U/A positive for nitrites and a large amount of leukocytes with turbid urine. - Vigorously hydrated in the ED and placed on Levaquin, aztreonam and vancomycin. D/Ced  vancomycin and aztreonam. - Pro calcitonin markedly elevated but serum lactate WNL. - Blood culture growing Klebsiella pneumoniae one out of 2 bottles, second organism growing still pending. Clips are pneumoniae sensitive to ciprofloxacin, so continue Levaquin. - Hypotensive on admission, blood pressure now improved status post hydration.   Active Problems:  Right facial weakness / H/O Schwannoma / Facial twitching / Bilateral foot twitching / Hypocalcemia - H/O Schwannoma diagnosed by MRI 07/07/14. Facial weakness longstanding. - EEG was obtained which was negative - Patient also received 2 g IV calcium gluconate - Patient still getting intermittent twitching in the feet   History of hypertension - Blood pressure medications including Lasix, Cozaar on hold secondary to hypotension on admission.   Coagulopathy - INR is elevated. Possibly related to sepsis.    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   Dementia of the Alzheimer's type/Wheelchair bound - Supportive care.   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   Renal mass - 2.2 cm left lower pole mass worrisome for renal cell  carcinoma. - Saw urologist 01/15/15 and did not want aggressive treatment for this.  - A F/U CT done 06/26/15 showed slight increase in size. - She is scheduled to follow-up with Dr. Rosana Hoes 07/23/15.   Transaminitis/cholelithiasis - S/P RUQ abdominal ultrasound which showed cholelithiasis without sonographic evidence of cholecystitis. - Holding Imatinib in case elevated LFTs related to liver toxicity. - LFTs improving with hydration, questionable shock liver versus secondary to Imatinib   AKI (acute kidney injury) - Secondary to GI issues with nausea/vomiting, dehydration and sepsis. - Creatinine improving with hydration. Baseline creatinine 1.00.  Constipation - Dulcolax suppository   DVT Prophylaxis - Lovenox ordered.   Code Status: Full code Family Communication: Discussed with caregiver at bedside Disposition Plan: Home when medically stable   Consultants: None  Procedures: None  Antibiotics: Vancomycin Azactam Levaquin     Subjective:/ HPI  81 y.o. 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 She has a chronic indwelling urinary catheter.  This morning patient complains of constipation.   Objective:   Filed Vitals:   07/10/15  1343  BP: 104/64  Pulse: 68  Temp: 98.6 F (37 C)  Resp: 20    Intake/Output Summary (Last 24 hours) at 07/10/15 1553 Last data filed at 07/10/15 1500  Gross per 24 hour  Intake 2881.75 ml  Output   3575 ml  Net -693.25 ml   Filed Weights   07/07/15 2345 07/09/15 0459 07/10/15 0502  Weight: 103.2 kg (227 lb 8.2 oz) 106.1 kg (233 lb 14.5 oz) 107.5 kg (236 lb 15.9 oz)      Exam:   General:  Appears in no acute distress  Cardiovascular: S1-S2  regular  Respiratory: Clear to auscultation bilaterally  Abdomen: Soft, nontender, no organomegaly  Musculoskeletal: No edema of lower extremities       Data Review:   Micro Results Recent Results (from the past 240 hour(s))  Culture, blood (routine x 2)     Status: None (Preliminary result)   Collection Time: 07/07/15  6:35 PM  Result Value Ref Range Status   Specimen Description BLOOD LEFT ANTECUBITAL  Final   Special Requests BOTTLES DRAWN AEROBIC AND ANAEROBIC 5CC  Final   Culture  Setup Time   Final    GRAM NEGATIVE RODS AEROBIC BOTTLE ONLY CRITICAL RESULT CALLED TO, READ BACK BY AND VERIFIED WITH: I HABIB 07/08/15 @ 1340 M VESTAL    Culture   Final    KLEBSIELLA PNEUMONIAE Performed at [facility]    Report Status PENDING  Incomplete   Organism ID, Bacteria KLEBSIELLA PNEUMONIAE  Final      Susceptibility   Klebsiella pneumoniae - MIC*    AMPICILLIN >=32 RESISTANT Resistant     CEFAZOLIN <=4 SENSITIVE Sensitive     CEFEPIME <=1 SENSITIVE Sensitive     CEFTAZIDIME <=1 SENSITIVE Sensitive     CEFTRIAXONE <=1 SENSITIVE Sensitive     CIPROFLOXACIN <=0.25 SENSITIVE Sensitive     GENTAMICIN <=1 SENSITIVE Sensitive     IMIPENEM <=0.25 SENSITIVE Sensitive     TRIMETH/SULFA <=20 SENSITIVE Sensitive     AMPICILLIN/SULBACTAM 8 SENSITIVE Sensitive     PIP/TAZO <=4 SENSITIVE Sensitive     * KLEBSIELLA PNEUMONIAE  Culture, blood (routine x 2)     Status: None (Preliminary result)   Collection Time: 07/07/15  6:35 PM  Result Value Ref Range Status   Specimen Description BLOOD LEFT WRIST  Final   Special Requests BOTTLES DRAWN AEROBIC AND ANAEROBIC 5CC EACH  Final   Culture   Final    NO GROWTH 3 DAYS Performed at [facility]    Report Status PENDING  Incomplete  Urine culture     Status: None   Collection Time: 07/07/15  7:01 PM  Result Value Ref Range Status   Specimen Description URINE, CATHETERIZED  Final   Special Requests NONE  Final   Culture    Final    MULTIPLE SPECIES PRESENT, SUGGEST RECOLLECTION Performed at [facility]    Report Status 07/09/2015 FINAL  Final  MRSA PCR Screening     Status: None   Collection Time: 07/07/15 11:43 PM  Result Value Ref Range Status   MRSA by PCR NEGATIVE NEGATIVE Final    Comment:        The GeneXpert MRSA Assay (FDA approved for NASAL specimens only), is one component of a comprehensive MRSA colonization surveillance program. It is not intended to diagnose MRSA infection nor to guide or monitor treatment for MRSA infections.     Radiology Reports Dg Chest Portable 1 View  07/07/2015   CLINICAL DATA:  Mid abdominal pain.  Vomiting.  EXAM: PORTABLE CHEST - 1 VIEW  COMPARISON:  None.  FINDINGS: Enlarged cardiac silhouette. Clear lungs. Right diaphragmatic eventration. Bilateral shoulder degenerative changes. Superior migration of the right humeral head compatible with a large, chronic rotator cuff tear.  IMPRESSION: No acute abnormality.  Cardiomegaly.   Electronically Signed   By: Claudie Revering M.D.   On: 07/07/2015 19:48   US Abdomen Limited Ruq  07/07/2015   CLINICAL DATA:  Right upper quadrant pain, nausea and vomiting.  EXAM: US ABDOMEN LIMITED - RIGHT UPPER QUADRANT  COMPARISON:  None.  FINDINGS: Gallbladder:  Multiple calculi are visible in the gallbladder lumen. There is no gallbladder wall thickening or pericholecystic fluid. The patient was not tender over the gallbladder.  Common bile duct:  Diameter: Normal, 6.5 mm.  Liver:  No focal lesion identified. Within normal limits in parenchymal echogenicity.  IMPRESSION: Cholelithiasis without sonographic evidence of cholecystitis.   Electronically Signed   By: Andreas Newport M.D.   On: 07/07/2015 21:21     CBC  Recent Labs Lab 07/07/15 1839 07/08/15 0339  WBC 14.1* 9.8  HGB 10.4* 9.7*  HCT 31.8* 30.3*  PLT 135* 123*  MCV 95.5 96.2  MCH 31.2 30.8  MCHC 32.7 32.0  RDW 14.8 15.1  LYMPHSABS 0.6* 0.4*  MONOABS 1.3* 1.0   EOSABS 0.0 0.1  BASOSABS 0.0 0.0    Chemistries   Recent Labs Lab 07/07/15 1839 07/08/15 0339 07/09/15 0520 07/10/15 0515  NA 134* 135 134* 131*  K 4.7 4.2 3.4* 4.9  CL 102 106 108 109  CO2 28 23 21* 17*  GLUCOSE 112* 107* 118* 101*  BUN 31* 27* 26* 20  CREATININE 1.86* 1.50* 1.18* 0.92  CALCIUM 8.4* 7.7* 7.5* 7.8*  AST 523* 323*  --  99*  ALT 568* 395*  --  182*  ALKPHOS 221* 184*  --  216*  BILITOT 3.2* 2.5*  --  0.3   ------------------------------------------------------------------------------------------------------------------ estimated creatinine clearance is 54.5 mL/min (by C-G formula based on Cr of 0.92). ------------------------------------------------------------------------------------------------------------------   Time Spent in minutes   20 min  Tonnie Friedel S M.D on 07/10/2015 at 3:53 PM  Between 7am to 7pm - Pager - [phone] After 7pm go to [email or web address] - password [facility]  [facility] -  Office  [phone]

## 2015-07-10 NOTE — Progress Notes (Signed)
ANTIBIOTIC CONSULT NOTE - FOLLOW UP  Pharmacy Consult for Levaquin Indication: Sepsis   Allergies  Allergen Reactions  . Shellfish Allergy Anaphylaxis  . Other Other (See Comments)    HORSE SERUMYDrug[Other] swelling6/09/2006 12:00:00 AM by Erin Hearing CPhT  . Amoxicillin Rash and Swelling    Patient Measurements: Height: 5\' 4"  (162.6 cm) Weight: 236 lb 15.9 oz (107.5 kg) IBW/kg (Calculated) : 54.7 Adjusted Body Weight:   Vital Signs: Temp: 98.6 F (37 C) (09/06 1343) Temp Source: Oral (09/06 1343) BP: 104/64 mmHg (09/06 1343) Pulse Rate: 68 (09/06 1343) Intake/Output from previous day: 09/05 0701 - 09/06 0700 In: 2373.8 [P.O.:720; I.V.:1503.8; IV Piggyback:150] Out: 3200 [Urine:3200] Intake/Output from this shift: Total I/O In: 240 [P.O.:240] Out: 1375 [Urine:1375]  Labs:  Recent Labs  07/07/15 1839 07/08/15 0339 07/09/15 0520 07/10/15 0515  WBC 14.1* 9.8  --   --   HGB 10.4* 9.7*  --   --   PLT 135* 123*  --   --   CREATININE 1.86* 1.50* 1.18* 0.92   Estimated Creatinine Clearance: 54.5 mL/min (by C-G formula based on Cr of 0.92). No results for input(s): VANCOTROUGH, VANCOPEAK, VANCORANDOM, GENTTROUGH, GENTPEAK, GENTRANDOM, TOBRATROUGH, TOBRAPEAK, TOBRARND, AMIKACINPEAK, AMIKACINTROU, AMIKACIN in the last 72 hours.   Microbiology: Recent Results (from the past 720 hour(s))  Culture, blood (routine x 2)     Status: None (Preliminary result)   Collection Time: 07/07/15  6:35 PM  Result Value Ref Range Status   Specimen Description BLOOD LEFT ANTECUBITAL  Final   Special Requests BOTTLES DRAWN AEROBIC AND ANAEROBIC 5CC  Final   Culture  Setup Time   Final    GRAM NEGATIVE RODS AEROBIC BOTTLE ONLY CRITICAL RESULT CALLED TO, READ BACK BY AND VERIFIED WITH: I HABIB 07/08/15 @ 1340 M VESTAL    Culture   Final    KLEBSIELLA PNEUMONIAE Performed at [facility]    Report Status PENDING  Incomplete   Organism ID, Bacteria KLEBSIELLA PNEUMONIAE   Final      Susceptibility   Klebsiella pneumoniae - MIC*    AMPICILLIN >=32 RESISTANT Resistant     CEFAZOLIN <=4 SENSITIVE Sensitive     CEFEPIME <=1 SENSITIVE Sensitive     CEFTAZIDIME <=1 SENSITIVE Sensitive     CEFTRIAXONE <=1 SENSITIVE Sensitive     CIPROFLOXACIN <=0.25 SENSITIVE Sensitive     GENTAMICIN <=1 SENSITIVE Sensitive     IMIPENEM <=0.25 SENSITIVE Sensitive     TRIMETH/SULFA <=20 SENSITIVE Sensitive     AMPICILLIN/SULBACTAM 8 SENSITIVE Sensitive     PIP/TAZO <=4 SENSITIVE Sensitive     * KLEBSIELLA PNEUMONIAE  Culture, blood (routine x 2)     Status: None (Preliminary result)   Collection Time: 07/07/15  6:35 PM  Result Value Ref Range Status   Specimen Description BLOOD LEFT WRIST  Final   Special Requests BOTTLES DRAWN AEROBIC AND ANAEROBIC 5CC EACH  Final   Culture   Final    NO GROWTH 2 DAYS Performed at [facility]    Report Status PENDING  Incomplete  Urine culture     Status: None   Collection Time: 07/07/15  7:01 PM  Result Value Ref Range Status   Specimen Description URINE, CATHETERIZED  Final   Special Requests NONE  Final   Culture   Final    MULTIPLE SPECIES PRESENT, SUGGEST RECOLLECTION Performed at [facility]    Report Status 07/09/2015 FINAL  Final  MRSA PCR Screening     Status:  None   Collection Time: 07/07/15 11:43 PM  Result Value Ref Range Status   MRSA by PCR NEGATIVE NEGATIVE Final    Comment:        The GeneXpert MRSA Assay (FDA approved for NASAL specimens only), is one component of a comprehensive MRSA colonization surveillance program. It is not intended to diagnose MRSA infection nor to guide or monitor treatment for MRSA infections.     Anti-infectives    Start     Dose/Rate Route Frequency Ordered Stop   07/10/15 2200  levofloxacin (LEVAQUIN) IVPB 750 mg     750 mg 100 mL/hr over 90 Minutes Intravenous Every 24 hours 07/10/15 1400     07/09/15 2200  levofloxacin (LEVAQUIN) IVPB 750 mg  Status:   Discontinued     750 mg 100 mL/hr over 90 Minutes Intravenous Every 48 hours 07/07/15 2203 07/10/15 1359   07/08/15 1800  vancomycin (VANCOCIN) IVPB 1000 mg/200 mL premix  Status:  Discontinued     1,000 mg 200 mL/hr over 60 Minutes Intravenous Every 24 hours 07/07/15 1935 07/08/15 0730   07/08/15 0600  aztreonam (AZACTAM) 1 g in dextrose 5 % 50 mL IVPB  Status:  Discontinued     1 g 100 mL/hr over 30 Minutes Intravenous 3 times per day 07/08/15 0442 07/08/15 0731   07/08/15 0000  piperacillin-tazobactam (ZOSYN) IVPB 3.375 g  Status:  Discontinued     3.375 g 12.5 mL/hr over 240 Minutes Intravenous Every 8 hours 07/07/15 1935 07/07/15 2158   07/07/15 2215  levofloxacin (LEVAQUIN) IVPB 750 mg     750 mg 100 mL/hr over 90 Minutes Intravenous  Once 07/07/15 2203 07/08/15 0011   07/07/15 2215  aztreonam (AZACTAM) 2 g in dextrose 5 % 50 mL IVPB     2 g 100 mL/hr over 30 Minutes Intravenous  Once 07/07/15 2203 07/07/15 2300   07/07/15 2200  minocycline (MINOCIN,DYNACIN) capsule 50 mg     50 mg Oral Daily at bedtime 07/07/15 2154     07/07/15 1800  piperacillin-tazobactam (ZOSYN) IVPB 3.375 g     3.375 g 100 mL/hr over 30 Minutes Intravenous  Once 07/07/15 1750 07/07/15 1920   07/07/15 1800  vancomycin (VANCOCIN) IVPB 1000 mg/200 mL premix     1,000 mg 200 mL/hr over 60 Minutes Intravenous  Once 07/07/15 1750 07/07/15 2019      Assessment: 86 yoF presents from home with reports of vomiting, dark/red tinged urine from indwelling foley, abdominal pain.  Found to be hypotensive.  Pharmacy initially consulted to start broad spectrum antibiotics for r/o sepsis. Antibiotics adjusted to monotherapy with levaquin on 9/3.    9/3 >> Vanc >> 9/3 9/3 >> Zosyn >> 9/3 9/3 >> aztreo >>9/3 9/3 >> LVQ>>  9/3 blood x 2: 1 of 2 klebsiella (R-amp, sensitive all rest) 9/3 urine: multiple species present - suggest recollect 9/3 MRSA PCR neg  9/6: Pt afebrile, WBC WNL, improving renal function  Goal of  Therapy:  Eradication of infection  Plan:  Change to levaquin 750mg  IV q24h for improving renal function  Ralene Bathe, PharmD, BCPS 07/10/2015, 2:02 PM  Pager: [phone]

## 2015-07-11 DIAGNOSIS — N179 Acute kidney failure, unspecified: Secondary | ICD-10-CM

## 2015-07-11 LAB — HEPATIC FUNCTION PANEL
ALBUMIN: 2.5 g/dL — AB (ref 3.5–5.0)
ALT: 139 U/L — AB (ref 14–54)
AST: 64 U/L — AB (ref 15–41)
Alkaline Phosphatase: 208 U/L — ABNORMAL HIGH (ref 38–126)
Bilirubin, Direct: 0.2 mg/dL (ref 0.1–0.5)
Indirect Bilirubin: 0.6 mg/dL (ref 0.3–0.9)
TOTAL PROTEIN: 4.8 g/dL — AB (ref 6.5–8.1)
Total Bilirubin: 0.8 mg/dL (ref 0.3–1.2)

## 2015-07-11 LAB — BASIC METABOLIC PANEL
ANION GAP: 6 (ref 5–15)
BUN: 15 mg/dL (ref 6–20)
CO2: 20 mmol/L — ABNORMAL LOW (ref 22–32)
Calcium: 8.1 mg/dL — ABNORMAL LOW (ref 8.9–10.3)
Chloride: 111 mmol/L (ref 101–111)
Creatinine, Ser: 0.8 mg/dL (ref 0.44–1.00)
GFR calc Af Amer: >60 mL/min (ref >60)
Glucose, Bld: 99 mg/dL (ref 65–99)
POTASSIUM: 3.8 mmol/L (ref 3.5–5.1)
SODIUM: 137 mmol/L (ref 135–145)

## 2015-07-11 LAB — CULTURE, BLOOD (ROUTINE X 2)

## 2015-07-11 MED ORDER — BACLOFEN 5 MG HALF TABLET
5.0000 mg | ORAL_TABLET | Freq: Three times a day (TID) | ORAL | Status: DC
  Administered 2015-07-11: 5 mg via ORAL
  Filled 2015-07-11 (×5): qty 1

## 2015-07-11 NOTE — Progress Notes (Addendum)
Patient Demographics   Melanie Trevino, is a 81 y.o. female, DOB - 22-Feb-1928, LZJ:[id number] Admit date - 07/07/2015   Admitting Physician Lavina Hamman, MD Outpatient Primary MD for the patient is Horton Finer, MD LOS - 4    Assessment  & Plan :   Principal Problem:  Sepsis secondary to complicated UTI/chronic indwelling Foley catheter - Patient has a chronic indwelling Foley catheter. U/A positive for nitrites and a large amount of leukocytes with turbid urine. - Vigorously hydrated in the ED and placed on Levaquin, aztreonam and vancomycin. D/Ced  vancomycin and aztreonam. - Pro calcitonin markedly elevated but serum lactate WNL. - Blood culture growing Klebsiella pneumoniae one out of 2 bottles, second organism growing still pending. Klebsiellla pneumoniae sensitive to ciprofloxacin, so continue Levaquin. - Hypotensive on admission, blood pressure now improved status post hydration.   Active Problems:  Right facial weakness / H/O Schwannoma / Facial twitching / Bilateral foot twitching / Hypocalcemia - H/O Schwannoma diagnosed by MRI 07/07/14. Facial weakness longstanding. - EEG was obtained which was negative - Patient also received 2 g IV calcium gluconate - Patient still getting intermittent twitching in the feet - Will start Baclofen 5 mg po tid   History of hypertension - Blood pressure medications including Lasix, Cozaar on hold secondary to hypotension on admission.   Coagulopathy - INR is elevated. Possibly related to sepsis.    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   Dementia of the Alzheimer's type/Wheelchair bound - Supportive care.   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   Renal mass - 2.2 cm left lower pole mass  worrisome for renal cell carcinoma. - Saw urologist 01/15/15 and did not want aggressive treatment for this.  - A F/U CT done 06/26/15 showed slight increase in size. - She is scheduled to follow-up with Dr. Rosana Hoes 07/23/15.   Transaminitis/cholelithiasis - S/P RUQ abdominal ultrasound which showed cholelithiasis without sonographic evidence of cholecystitis. - Holding Imatinib in case elevated LFTs related to liver toxicity. - LFTs improving with hydration, questionable shock liver versus secondary to Imatinib   AKI (acute kidney injury) - Secondary to GI issues with nausea/vomiting, dehydration and sepsis. - Creatinine improving with hydration. Baseline creatinine 1.00.  Constipation - resolved - Dulcolax suppository   DVT Prophylaxis - Lovenox ordered.   Code Status: Full code Family Communication: Discussed with caregiver at bedside Disposition Plan: Home when medically stable   Consultants: None  Procedures: None  Antibiotics: Vancomycin Azactam Levaquin     Subjective:/ HPI  81 y.o. 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 She has a chronic indwelling urinary catheter.  Patient denies any complaints this morning.  Objective:   Filed Vitals:   07/11/15 1400  BP: 141/69  Pulse: 76  Temp: 98.6 F (37 C)  Resp: 20    Intake/Output Summary (Last 24 hours) at 07/11/15 1615 Last data filed at 07/11/15 1428  Gross per 24 hour  Intake 525.67 ml  Output   2750 ml  Net -2224.33 ml   Filed Weights   07/09/15 0459 07/10/15 0502 07/11/15 0529  Weight: 106.1 kg (233 lb 14.5 oz) 107.5 kg (236 lb 15.9 oz) 107.7 kg (237 lb 7 oz)      Exam:   General:  Appears in no acute  distress  Cardiovascular: S1-S2 regular  Respiratory: Clear to auscultation bilaterally  Abdomen: Soft, nontender, no organomegaly  Musculoskeletal: No edema of lower extremities       Data Review:   Micro Results Recent Results (from the past 240 hour(s))  Culture, blood (routine x 2)     Status: None   Collection Time: 07/07/15  6:35 PM  Result Value Ref Range Status   Specimen Description BLOOD LEFT ANTECUBITAL  Final   Special Requests BOTTLES DRAWN AEROBIC AND ANAEROBIC 5CC  Final   Culture  Setup Time   Final    GRAM NEGATIVE RODS AEROBIC BOTTLE ONLY CRITICAL RESULT CALLED TO, READ BACK BY AND VERIFIED WITH: I HABIB 07/08/15 @ 1340 M VESTAL    Culture   Final    KLEBSIELLA PNEUMONIAE Performed at [facility]    Report Status 07/11/2015 FINAL  Final   Organism ID, Bacteria KLEBSIELLA PNEUMONIAE  Final      Susceptibility   Klebsiella pneumoniae - MIC*    AMPICILLIN >=32 RESISTANT Resistant     CEFAZOLIN <=4 SENSITIVE Sensitive     CEFEPIME <=1 SENSITIVE Sensitive     CEFTAZIDIME <=1 SENSITIVE Sensitive     CEFTRIAXONE <=1 SENSITIVE Sensitive     CIPROFLOXACIN <=0.25 SENSITIVE Sensitive     GENTAMICIN <=1 SENSITIVE Sensitive     IMIPENEM <=0.25 SENSITIVE Sensitive     TRIMETH/SULFA <=20 SENSITIVE Sensitive     AMPICILLIN/SULBACTAM 8 SENSITIVE Sensitive     PIP/TAZO <=4 SENSITIVE Sensitive     * KLEBSIELLA PNEUMONIAE  Culture, blood (routine x 2)     Status: None (Preliminary result)   Collection Time: 07/07/15  6:35 PM  Result Value Ref Range Status   Specimen Description BLOOD LEFT WRIST  Final   Special Requests BOTTLES DRAWN AEROBIC AND ANAEROBIC 5CC EACH  Final   Culture   Final    NO GROWTH 4 DAYS Performed at [facility]    Report Status PENDING  Incomplete  Urine culture     Status: None   Collection Time: 07/07/15  7:01 PM  Result Value Ref Range Status   Specimen Description URINE, CATHETERIZED  Final   Special Requests NONE   Final   Culture   Final    MULTIPLE SPECIES PRESENT, SUGGEST RECOLLECTION Performed at [facility]    Report Status 07/09/2015 FINAL  Final  MRSA PCR Screening     Status: None   Collection Time: 07/07/15 11:43 PM  Result Value Ref Range Status   MRSA by PCR NEGATIVE NEGATIVE Final    Comment:        The GeneXpert MRSA Assay (FDA approved for NASAL specimens only), is one component of a comprehensive MRSA colonization surveillance program. It is not intended to diagnose MRSA infection nor to guide or monitor treatment for MRSA infections.     Radiology  Reports Dg Chest Portable 1 View  07/07/2015   CLINICAL DATA:  Mid abdominal pain.  Vomiting.  EXAM: PORTABLE CHEST - 1 VIEW  COMPARISON:  None.  FINDINGS: Enlarged cardiac silhouette. Clear lungs. Right diaphragmatic eventration. Bilateral shoulder degenerative changes. Superior migration of the right humeral head compatible with a large, chronic rotator cuff tear.  IMPRESSION: No acute abnormality.  Cardiomegaly.   Electronically Signed   By: Claudie Revering M.D.   On: 07/07/2015 19:48   US Abdomen Limited Ruq  07/07/2015   CLINICAL DATA:  Right upper quadrant pain, nausea and vomiting.  EXAM: US ABDOMEN LIMITED - RIGHT UPPER QUADRANT  COMPARISON:  None.  FINDINGS: Gallbladder:  Multiple calculi are visible in the gallbladder lumen. There is no gallbladder wall thickening or pericholecystic fluid. The patient was not tender over the gallbladder.  Common bile duct:  Diameter: Normal, 6.5 mm.  Liver:  No focal lesion identified. Within normal limits in parenchymal echogenicity.  IMPRESSION: Cholelithiasis without sonographic evidence of cholecystitis.   Electronically Signed   By: Andreas Newport M.D.   On: 07/07/2015 21:21     CBC  Recent Labs Lab 07/07/15 1839 07/08/15 0339  WBC 14.1* 9.8  HGB 10.4* 9.7*  HCT 31.8* 30.3*  PLT 135* 123*  MCV 95.5 96.2  MCH 31.2 30.8  MCHC 32.7 32.0  RDW 14.8 15.1  LYMPHSABS 0.6* 0.4*   MONOABS 1.3* 1.0  EOSABS 0.0 0.1  BASOSABS 0.0 0.0    Chemistries   Recent Labs Lab 07/07/15 1839 07/08/15 0339 07/09/15 0520 07/10/15 0515 07/11/15 0535 07/11/15 1030  NA 134* 135 134* 131* 137  --   K 4.7 4.2 3.4* 4.9 3.8  --   CL 102 106 108 109 111  --   CO2 28 23 21* 17* 20*  --   GLUCOSE 112* 107* 118* 101* 99  --   BUN 31* 27* 26* 20 15  --   CREATININE 1.86* 1.50* 1.18* 0.92 0.80  --   CALCIUM 8.4* 7.7* 7.5* 7.8* 8.1*  --   AST 523* 323*  --  99*  --  64*  ALT 568* 395*  --  182*  --  139*  ALKPHOS 221* 184*  --  216*  --  208*  BILITOT 3.2* 2.5*  --  0.3  --  0.8   ------------------------------------------------------------------------------------------------------------------ estimated creatinine clearance is 62.7 mL/min (by C-G formula based on Cr of 0.8). ------------------------------------------------------------------------------------------------------------------   Time Spent in minutes   20 min  LAMA,GAGAN S M.D on 07/11/2015 at 4:15 PM  Between 7am to 7pm - Pager - [phone] After 7pm go to [email or web address] - password [facility]  [facility] -  Office  [phone]

## 2015-07-11 NOTE — Care Management Important Message (Signed)
Important Message  Patient Details  Name: Melanie Trevino MRN: [id number] Date of Birth: 03/07/1928   Medicare Important Message Given:  Yes-second notification given    Camillo Flaming 07/11/2015, 1:32 PMImportant Message  Patient Details  Name: Melanie Trevino MRN: [id number] Date of Birth: [birth year]   Medicare Important Message Given:  Yes-second notification given    Camillo Flaming 07/11/2015, 1:31 PM

## 2015-07-12 DIAGNOSIS — R29810 Facial weakness: Secondary | ICD-10-CM

## 2015-07-12 DIAGNOSIS — G309 Alzheimer's disease, unspecified: Secondary | ICD-10-CM

## 2015-07-12 DIAGNOSIS — I9589 Other hypotension: Secondary | ICD-10-CM

## 2015-07-12 DIAGNOSIS — Z9889 Other specified postprocedural states: Secondary | ICD-10-CM

## 2015-07-12 DIAGNOSIS — D481 Neoplasm of uncertain behavior of connective and other soft tissue: Secondary | ICD-10-CM

## 2015-07-12 LAB — CULTURE, BLOOD (ROUTINE X 2): CULTURE: NO GROWTH

## 2015-07-12 LAB — COMPREHENSIVE METABOLIC PANEL
ALBUMIN: 2.6 g/dL — AB (ref 3.5–5.0)
ALT: 124 U/L — ABNORMAL HIGH (ref 14–54)
ANION GAP: 6 (ref 5–15)
AST: 61 U/L — ABNORMAL HIGH (ref 15–41)
Alkaline Phosphatase: 203 U/L — ABNORMAL HIGH (ref 38–126)
BILIRUBIN TOTAL: 1 mg/dL (ref 0.3–1.2)
BUN: 14 mg/dL (ref 6–20)
CHLORIDE: 112 mmol/L — AB (ref 101–111)
CO2: 22 mmol/L (ref 22–32)
Calcium: 8.5 mg/dL — ABNORMAL LOW (ref 8.9–10.3)
Creatinine, Ser: 0.71 mg/dL (ref 0.44–1.00)
GFR calc Af Amer: >60 mL/min (ref >60)
GFR calc non Af Amer: >60 mL/min (ref >60)
GLUCOSE: 101 mg/dL — AB (ref 65–99)
POTASSIUM: 3.6 mmol/L (ref 3.5–5.1)
SODIUM: 140 mmol/L (ref 135–145)
TOTAL PROTEIN: 5.1 g/dL — AB (ref 6.5–8.1)

## 2015-07-12 MED ORDER — POTASSIUM CHLORIDE CRYS ER 20 MEQ PO TBCR: 40.0000 meq | EXTENDED_RELEASE_TABLET | Freq: Every day | ORAL | Status: DC

## 2015-07-12 MED ORDER — FUROSEMIDE 40 MG PO TABS
40.0000 mg | ORAL_TABLET | Freq: Every day | ORAL | Status: DC
  Administered 2015-07-12: 40 mg via ORAL
  Filled 2015-07-12: qty 1

## 2015-07-12 MED ORDER — FUROSEMIDE 40 MG PO TABS: 40.0000 mg | ORAL_TABLET | Freq: Every day | ORAL | Status: DC

## 2015-07-12 MED ORDER — LOSARTAN POTASSIUM 50 MG PO TABS: 50.0000 mg | ORAL_TABLET | Freq: Every day | ORAL | Status: DC

## 2015-07-12 MED ORDER — SULFAMETHOXAZOLE-TRIMETHOPRIM 800-160 MG PO TABS: 1.0000 | ORAL_TABLET | Freq: Two times a day (BID) | ORAL | Status: DC

## 2015-07-12 NOTE — Care Management Note (Signed)
Case Management Note  Patient Details  Name: KATHELENE RUMBERGER MRN: [id number] Date of Birth: 1928/01/12  Subjective/Objective: 81 y/o f admitted w/Sepsis.From home w/24hr caregiver services.                   Action/Plan:d/c home no needs or orders.   Expected Discharge Date:                  Expected Discharge Plan:  Home/Self Care  In-House Referral:     Discharge planning Services  CM Consult  Post Acute Care Choice:  Resumption of Svcs/PTA Provider (already has 24hr caregiver services.) Choice offered to:     DME Arranged:    DME Agency:     HH Arranged:    [place] Agency:     Status of Service:  Completed, signed off  Medicare Important Message Given:  Yes-second notification given Date Medicare IM Given:    Medicare IM give by:    Date Additional Medicare IM Given:    Additional Medicare Important Message give by:     If discussed at [place] of Stay Meetings, dates discussed:    Additional Comments:  Dessa Phi, RN 07/12/2015, 12:29 PM

## 2015-07-12 NOTE — Discharge Summary (Signed)
Physician Discharge Summary  Melanie Trevino:[id number] DOB: July 03, 1928 DOA: 07/07/2015  PCP: Horton Finer, MD  Admit date: 07/07/2015 Discharge date: 07/12/2015  Time spent: 25 minutes  Recommendations for Outpatient Follow-up:  1. *Follow up PCP in 2 weeks  Discharge Diagnoses:  Principal Problem:   Sepsis Active Problems:   Breast cancer   Dementia of the Alzheimer's type   Wheelchair bound   Gastrointestinal stromal tumor (GIST)   Liver metastasis   Renal mass   Chronic indwelling Foley catheter   Transaminitis   AKI (acute kidney injury)   Cholelithiasis   Complicated UTI (urinary tract infection)   Anemia in neoplastic disease   Thrombocytopenia   Coagulopathy   Facial muscle weakness   Hypocalcemia   Muscle twitching   Arterial hypotension   Bacteremia   Discharge Condition: *Stable  Diet recommendation: low salt diet  Filed Weights   07/09/15 0459 07/10/15 0502 07/11/15 0529  Weight: 106.1 kg (233 lb 14.5 oz) 107.5 kg (236 lb 15.9 oz) 107.7 kg (237 lb 7 oz)    History of present illness:  81 y.o. 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 The patient presents with complains of dark urine as well as abdominal pain and vomiting. The history was received from the caregiver.the patient has 24 7 caregiver assistance due to her memory The patient has history of GIST and follows up at [place]. Recently her imatinib was switched from brand name to generic. Since last 2 days the patient has been having episodes of vomiting of nonbilious nonbloody vomitus. When the caregiver came to work today she has noted that the patient's urine was darker and look like blood.  Hospital Course:  Sepsis secondary to complicated UTI/chronic indwelling Foley catheter - Patient has a chronic indwelling Foley catheter. U/A positive for nitrites and a large  amount of leukocytes with turbid urine. - Vigorously hydrated in the ED and placed on Levaquin, aztreonam and vancomycin. D/Ced vancomycin and aztreonam. - Pro calcitonin markedly elevated but serum lactate WNL. - Blood culture growing Klebsiella pneumoniae one out of 2 bottles  Klebsiellla pneumoniae sensitive to ciprofloxacin, so was continued on  Levaquin. - Hypotensive on admission, blood pressure now improved status post hydration. - I checked with ID on call Dr Megan Salon, who recommends to start Bactrim DS for total 14 days of therapy. - Will discharge on bactrim DS 1 tab po BID for 9 more days.   Active Problems:  Right facial weakness / H/O Schwannoma / Facial twitching / Bilateral foot twitching / Hypocalcemia - H/O Schwannoma diagnosed by MRI 07/07/14. Facial weakness longstanding. - EEG was obtained which was negative - Patient also received 2 g IV calcium gluconate - Patient still getting intermittent twitching in the feet - Improved with baclofen but started hallucinating last night, so discontinued baclofen.   History of hypertension -Change the Cozaar (50 mg) and lasix (40 mg) to once daily.  Mild wheezing ?CHF - patient has mild wheezing, denies shortness of breath. She was taking lasix at home which  was held in the hospital. Will cut down the dose of lasix to 40 mg po daily.   Normocytic anemia in neoplastic disease/thrombocytopenia - Likely related to underlying malignancy    History of Breast cancer - In remission.   Dementia of the Alzheimer's type/Wheelchair bound - Supportive care.   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 Will follow up Oncology as outpatient for further recommendations.   Renal mass - 2.2 cm left lower pole mass worrisome for renal cell carcinoma. - Saw urologist 01/15/15 and did not want aggressive treatment for this.  - A F/U CT done 06/26/15  showed slight increase in size. - She is scheduled to follow-up with Dr. Rosana Hoes 07/23/15.   Transaminitis/cholelithiasis - S/P RUQ abdominal ultrasound which showed cholelithiasis without sonographic evidence of cholecystitis. - Holding Imatinib in case elevated LFTs related to liver toxicity. - LFTs improving with hydration, questionable shock liver versus secondary to Imatinib - Discontinue Gleevec. - Today AST 61, ALT 124.   AKI (acute kidney injury) - Secondary to GI issues with nausea/vomiting, dehydration and sepsis. - Creatinine improving with hydration.  -today creatinine is 0.71  Constipation - resolved - continue colace  Procedures:  None  Consultations:  None  Discharge Exam: Filed Vitals:   07/12/15 0446  BP: 131/55  Pulse: 58  Temp: 99.6 F (37.6 C)  Resp: 18    General: Appears in no acute distress Cardiovascular: S1S2 RRR Respiratory: mild wheezing  Discharge Instructions   Discharge Instructions    Diet - low sodium heart healthy    Complete by:  As directed      Discharge instructions    Complete by:  As directed   Stop taking Gleevec, check with Oncologist for further recommendations     Increase activity slowly    Complete by:  As directed           Current Discharge Medication List    START taking these medications   Details  sulfamethoxazole-trimethoprim (BACTRIM DS,SEPTRA DS) 800-160 MG per tablet Take 1 tablet by mouth 2 (two) times daily. Qty: 18 tablet, Refills: 0      CONTINUE these medications which have CHANGED   Details  furosemide (LASIX) 40 MG tablet Take 1 tablet (40 mg total) by mouth daily. Qty: 30 tablet, Refills: 2    losartan (COZAAR) 50 MG tablet Take 1 tablet (50 mg total) by mouth daily.    potassium chloride SA (K-DUR,KLOR-CON) 20 MEQ tablet Take 2 tablets (40 mEq total) by mouth daily.      CONTINUE these medications which have NOT CHANGED   Details  albuterol (PROVENTIL HFA;VENTOLIN HFA) 108 (90  BASE) MCG/ACT inhaler Inhale 2 puffs into the lungs every 4 (four) hours as needed for wheezing or shortness of breath (cough, shortness of breath or wheezing.). Qty: 1 Inhaler, Refills: 1    calcium carbonate (OS-CAL) 600 MG TABS tablet Take 600 mg by mouth 2 (two) times daily with a meal.    docusate sodium (COLACE) 100 MG capsule Take 100 mg by mouth at bedtime.    fluticasone (FLONASE) 50 MCG/ACT nasal spray Place 1 spray into both nostrils daily.     methocarbamol (ROBAXIN) 500 MG tablet Take 500 mg by mouth every 8 (eight) hours as needed for muscle spasms.     minocycline (DYNACIN) 50 MG tablet Take 50 mg by mouth at bedtime.    Spacer/Aero-Holding Dorise Bullion Use with MDI as directed Qty: 1 each, Refills: 2    vitamin B-12 (CYANOCOBALAMIN) 1000 MCG tablet Take 1,000 mcg by mouth 2 (two) times daily.       STOP taking these medications     imatinib (GLEEVEC) 400 MG tablet        Allergies  Allergen Reactions  . Shellfish Allergy Anaphylaxis  . Other Other (See Comments)  HORSE SERUMYDrug[Other] swelling6/09/2006 12:00:00 AM by Erin Hearing CPhT  . Amoxicillin Rash and Swelling   Follow-up Information    Follow up with Horton Finer, MD In 2 weeks.   Specialty:  Internal Medicine   Contact information:   [address] [place] [address] [phone]        The results of significant diagnostics from this hospitalization (including imaging, microbiology, ancillary and laboratory) are listed below for reference.    Significant Diagnostic Studies: Dg Chest Portable 1 View  07/07/2015   CLINICAL DATA:  Mid abdominal pain.  Vomiting.  EXAM: PORTABLE CHEST - 1 VIEW  COMPARISON:  None.  FINDINGS: Enlarged cardiac silhouette. Clear lungs. Right diaphragmatic eventration. Bilateral shoulder degenerative changes. Superior migration of the right humeral head compatible with a large, chronic rotator cuff tear.  IMPRESSION: No acute  abnormality.  Cardiomegaly.   Electronically Signed   By: Claudie Revering M.D.   On: 07/07/2015 19:48   US Abdomen Limited Ruq  07/07/2015   CLINICAL DATA:  Right upper quadrant pain, nausea and vomiting.  EXAM: US ABDOMEN LIMITED - RIGHT UPPER QUADRANT  COMPARISON:  None.  FINDINGS: Gallbladder:  Multiple calculi are visible in the gallbladder lumen. There is no gallbladder wall thickening or pericholecystic fluid. The patient was not tender over the gallbladder.  Common bile duct:  Diameter: Normal, 6.5 mm.  Liver:  No focal lesion identified. Within normal limits in parenchymal echogenicity.  IMPRESSION: Cholelithiasis without sonographic evidence of cholecystitis.   Electronically Signed   By: Andreas Newport M.D.   On: 07/07/2015 21:21    Microbiology: Recent Results (from the past 240 hour(s))  Culture, blood (routine x 2)     Status: None   Collection Time: 07/07/15  6:35 PM  Result Value Ref Range Status   Specimen Description BLOOD LEFT ANTECUBITAL  Final   Special Requests BOTTLES DRAWN AEROBIC AND ANAEROBIC 5CC  Final   Culture  Setup Time   Final    GRAM NEGATIVE RODS AEROBIC BOTTLE ONLY CRITICAL RESULT CALLED TO, READ BACK BY AND VERIFIED WITH: I HABIB 07/08/15 @ 1340 M VESTAL    Culture   Final    KLEBSIELLA PNEUMONIAE Performed at [facility]    Report Status 07/11/2015 FINAL  Final   Organism ID, Bacteria KLEBSIELLA PNEUMONIAE  Final      Susceptibility   Klebsiella pneumoniae - MIC*    AMPICILLIN >=32 RESISTANT Resistant     CEFAZOLIN <=4 SENSITIVE Sensitive     CEFEPIME <=1 SENSITIVE Sensitive     CEFTAZIDIME <=1 SENSITIVE Sensitive     CEFTRIAXONE <=1 SENSITIVE Sensitive     CIPROFLOXACIN <=0.25 SENSITIVE Sensitive     GENTAMICIN <=1 SENSITIVE Sensitive     IMIPENEM <=0.25 SENSITIVE Sensitive     TRIMETH/SULFA <=20 SENSITIVE Sensitive     AMPICILLIN/SULBACTAM 8 SENSITIVE Sensitive     PIP/TAZO <=4 SENSITIVE Sensitive     * KLEBSIELLA PNEUMONIAE  Culture,  blood (routine x 2)     Status: None (Preliminary result)   Collection Time: 07/07/15  6:35 PM  Result Value Ref Range Status   Specimen Description BLOOD LEFT WRIST  Final   Special Requests BOTTLES DRAWN AEROBIC AND ANAEROBIC 5CC EACH  Final   Culture   Final    NO GROWTH 4 DAYS Performed at [facility]    Report Status PENDING  Incomplete  Urine culture     Status: None   Collection Time: 07/07/15  7:01 PM  Result Value Ref Range Status   Specimen Description URINE, CATHETERIZED  Final   Special Requests NONE  Final   Culture   Final    MULTIPLE SPECIES PRESENT, SUGGEST RECOLLECTION Performed at [facility]    Report Status 07/09/2015 FINAL  Final  MRSA PCR Screening     Status: None   Collection Time: 07/07/15 11:43 PM  Result Value Ref Range Status   MRSA by PCR NEGATIVE NEGATIVE Final    Comment:        The GeneXpert MRSA Assay (FDA approved for NASAL specimens only), is one component of a comprehensive MRSA colonization surveillance program. It is not intended to diagnose MRSA infection nor to guide or monitor treatment for MRSA infections.      Labs: Basic Metabolic Panel:  Recent Labs Lab 07/08/15 0339 07/09/15 0520 07/10/15 0515 07/11/15 0535 07/12/15 0545  NA 135 134* 131* 137 140  K 4.2 3.4* 4.9 3.8 3.6  CL 106 108 109 111 112*  CO2 23 21* 17* 20* 22  GLUCOSE 107* 118* 101* 99 101*  BUN 27* 26* 20 15 14   CREATININE 1.50* 1.18* 0.92 0.80 0.71  CALCIUM 7.7* 7.5* 7.8* 8.1* 8.5*   Liver Function Tests:  Recent Labs Lab 07/07/15 1839 07/08/15 0339 07/10/15 0515 07/11/15 1030 07/12/15 0545  AST 523* 323* 99* 64* 61*  ALT 568* 395* 182* 139* 124*  ALKPHOS 221* 184* 216* 208* 203*  BILITOT 3.2* 2.5* 0.3 0.8 1.0  PROT 5.9* 5.4* 4.5* 4.8* 5.1*  ALBUMIN 3.4* 2.9* 2.4* 2.5* 2.6*   No results for input(s): LIPASE, AMYLASE in the last 168 hours. No results for input(s): AMMONIA in the last 168 hours. CBC:  Recent Labs Lab  07/07/15 1839 07/08/15 0339  WBC 14.1* 9.8  NEUTROABS 12.2* 8.3*  HGB 10.4* 9.7*  HCT 31.8* 30.3*  MCV 95.5 96.2  PLT 135* 123*     Signed:  Millena Callins S  [facility] 07/12/2015, 12:15 PM

## 2015-07-16 DIAGNOSIS — Z79891 Long term (current) use of opiate analgesic: Secondary | ICD-10-CM | POA: Diagnosis not present

## 2015-07-16 DIAGNOSIS — I10 Essential (primary) hypertension: Secondary | ICD-10-CM | POA: Diagnosis not present

## 2015-07-16 DIAGNOSIS — R32 Unspecified urinary incontinence: Secondary | ICD-10-CM | POA: Diagnosis not present

## 2015-07-16 DIAGNOSIS — N319 Neuromuscular dysfunction of bladder, unspecified: Secondary | ICD-10-CM | POA: Diagnosis not present

## 2015-07-16 DIAGNOSIS — C494 Malignant neoplasm of connective and soft tissue of abdomen: Secondary | ICD-10-CM | POA: Diagnosis not present

## 2015-07-16 DIAGNOSIS — Z466 Encounter for fitting and adjustment of urinary device: Secondary | ICD-10-CM | POA: Diagnosis not present

## 2015-07-23 DIAGNOSIS — N2889 Other specified disorders of kidney and ureter: Secondary | ICD-10-CM | POA: Diagnosis not present

## 2015-07-25 DIAGNOSIS — I10 Essential (primary) hypertension: Secondary | ICD-10-CM | POA: Diagnosis not present

## 2015-07-25 DIAGNOSIS — D333 Benign neoplasm of cranial nerves: Secondary | ICD-10-CM | POA: Diagnosis not present

## 2015-07-25 DIAGNOSIS — Z1389 Encounter for screening for other disorder: Secondary | ICD-10-CM | POA: Diagnosis not present

## 2015-07-25 DIAGNOSIS — M160 Bilateral primary osteoarthritis of hip: Secondary | ICD-10-CM | POA: Diagnosis not present

## 2015-07-25 DIAGNOSIS — Z23 Encounter for immunization: Secondary | ICD-10-CM | POA: Diagnosis not present

## 2015-07-25 DIAGNOSIS — E538 Deficiency of other specified B group vitamins: Secondary | ICD-10-CM | POA: Diagnosis not present

## 2015-07-25 DIAGNOSIS — Z741 Need for assistance with personal care: Secondary | ICD-10-CM | POA: Diagnosis not present

## 2015-07-25 DIAGNOSIS — R269 Unspecified abnormalities of gait and mobility: Secondary | ICD-10-CM | POA: Diagnosis not present

## 2015-07-25 DIAGNOSIS — G609 Hereditary and idiopathic neuropathy, unspecified: Secondary | ICD-10-CM | POA: Diagnosis not present

## 2015-07-25 DIAGNOSIS — C50919 Malignant neoplasm of unspecified site of unspecified female breast: Secondary | ICD-10-CM | POA: Diagnosis not present

## 2015-07-25 DIAGNOSIS — F0390 Unspecified dementia without behavioral disturbance: Secondary | ICD-10-CM | POA: Diagnosis not present

## 2015-07-25 DIAGNOSIS — N183 Chronic kidney disease, stage 3 (moderate): Secondary | ICD-10-CM | POA: Diagnosis not present

## 2015-07-25 DIAGNOSIS — R7309 Other abnormal glucose: Secondary | ICD-10-CM | POA: Diagnosis not present

## 2015-07-25 DIAGNOSIS — Z0000 Encounter for general adult medical examination without abnormal findings: Secondary | ICD-10-CM | POA: Diagnosis not present

## 2015-07-25 DIAGNOSIS — N319 Neuromuscular dysfunction of bladder, unspecified: Secondary | ICD-10-CM | POA: Diagnosis not present

## 2015-07-25 DIAGNOSIS — D214 Benign neoplasm of connective and other soft tissue of abdomen: Secondary | ICD-10-CM | POA: Diagnosis not present

## 2015-08-01 DIAGNOSIS — C494 Malignant neoplasm of connective and soft tissue of abdomen: Secondary | ICD-10-CM | POA: Diagnosis not present

## 2015-08-01 DIAGNOSIS — Z1211 Encounter for screening for malignant neoplasm of colon: Secondary | ICD-10-CM | POA: Diagnosis not present

## 2015-08-04 DIAGNOSIS — R32 Unspecified urinary incontinence: Secondary | ICD-10-CM | POA: Diagnosis not present

## 2015-08-04 DIAGNOSIS — I10 Essential (primary) hypertension: Secondary | ICD-10-CM | POA: Diagnosis not present

## 2015-08-04 DIAGNOSIS — Z79891 Long term (current) use of opiate analgesic: Secondary | ICD-10-CM | POA: Diagnosis not present

## 2015-08-04 DIAGNOSIS — N319 Neuromuscular dysfunction of bladder, unspecified: Secondary | ICD-10-CM | POA: Diagnosis not present

## 2015-08-04 DIAGNOSIS — Z466 Encounter for fitting and adjustment of urinary device: Secondary | ICD-10-CM | POA: Diagnosis not present

## 2015-08-10 DIAGNOSIS — Z466 Encounter for fitting and adjustment of urinary device: Secondary | ICD-10-CM | POA: Diagnosis not present

## 2015-08-10 DIAGNOSIS — Z79891 Long term (current) use of opiate analgesic: Secondary | ICD-10-CM | POA: Diagnosis not present

## 2015-08-10 DIAGNOSIS — I10 Essential (primary) hypertension: Secondary | ICD-10-CM | POA: Diagnosis not present

## 2015-08-10 DIAGNOSIS — N319 Neuromuscular dysfunction of bladder, unspecified: Secondary | ICD-10-CM | POA: Diagnosis not present

## 2015-08-10 DIAGNOSIS — R32 Unspecified urinary incontinence: Secondary | ICD-10-CM | POA: Diagnosis not present

## 2015-08-13 DIAGNOSIS — I872 Venous insufficiency (chronic) (peripheral): Secondary | ICD-10-CM | POA: Diagnosis not present

## 2015-08-13 DIAGNOSIS — I8312 Varicose veins of left lower extremity with inflammation: Secondary | ICD-10-CM | POA: Diagnosis not present

## 2015-08-13 DIAGNOSIS — L299 Pruritus, unspecified: Secondary | ICD-10-CM | POA: Diagnosis not present

## 2015-08-13 DIAGNOSIS — L249 Irritant contact dermatitis, unspecified cause: Secondary | ICD-10-CM | POA: Diagnosis not present

## 2015-08-13 DIAGNOSIS — I8311 Varicose veins of right lower extremity with inflammation: Secondary | ICD-10-CM | POA: Diagnosis not present

## 2015-08-14 DIAGNOSIS — F0280 Dementia in other diseases classified elsewhere without behavioral disturbance: Secondary | ICD-10-CM | POA: Diagnosis not present

## 2015-08-14 DIAGNOSIS — I10 Essential (primary) hypertension: Secondary | ICD-10-CM | POA: Diagnosis not present

## 2015-08-14 DIAGNOSIS — G309 Alzheimer's disease, unspecified: Secondary | ICD-10-CM | POA: Diagnosis not present

## 2015-08-14 DIAGNOSIS — Z466 Encounter for fitting and adjustment of urinary device: Secondary | ICD-10-CM | POA: Diagnosis not present

## 2015-08-14 DIAGNOSIS — R32 Unspecified urinary incontinence: Secondary | ICD-10-CM | POA: Diagnosis not present

## 2015-08-14 DIAGNOSIS — N319 Neuromuscular dysfunction of bladder, unspecified: Secondary | ICD-10-CM | POA: Diagnosis not present

## 2015-08-24 DIAGNOSIS — J309 Allergic rhinitis, unspecified: Secondary | ICD-10-CM | POA: Diagnosis not present

## 2015-08-27 DIAGNOSIS — H04123 Dry eye syndrome of bilateral lacrimal glands: Secondary | ICD-10-CM | POA: Diagnosis not present

## 2015-08-27 DIAGNOSIS — Z961 Presence of intraocular lens: Secondary | ICD-10-CM | POA: Diagnosis not present

## 2015-08-27 DIAGNOSIS — H16101 Unspecified superficial keratitis, right eye: Secondary | ICD-10-CM | POA: Diagnosis not present

## 2015-09-03 DIAGNOSIS — R32 Unspecified urinary incontinence: Secondary | ICD-10-CM | POA: Diagnosis not present

## 2015-09-03 DIAGNOSIS — N319 Neuromuscular dysfunction of bladder, unspecified: Secondary | ICD-10-CM | POA: Diagnosis not present

## 2015-09-03 DIAGNOSIS — F0280 Dementia in other diseases classified elsewhere without behavioral disturbance: Secondary | ICD-10-CM | POA: Diagnosis not present

## 2015-09-03 DIAGNOSIS — G309 Alzheimer's disease, unspecified: Secondary | ICD-10-CM | POA: Diagnosis not present

## 2015-09-03 DIAGNOSIS — Z466 Encounter for fitting and adjustment of urinary device: Secondary | ICD-10-CM | POA: Diagnosis not present

## 2015-09-03 DIAGNOSIS — I10 Essential (primary) hypertension: Secondary | ICD-10-CM | POA: Diagnosis not present

## 2015-10-03 DIAGNOSIS — R32 Unspecified urinary incontinence: Secondary | ICD-10-CM | POA: Diagnosis not present

## 2015-10-03 DIAGNOSIS — F0280 Dementia in other diseases classified elsewhere without behavioral disturbance: Secondary | ICD-10-CM | POA: Diagnosis not present

## 2015-10-03 DIAGNOSIS — Z466 Encounter for fitting and adjustment of urinary device: Secondary | ICD-10-CM | POA: Diagnosis not present

## 2015-10-03 DIAGNOSIS — N319 Neuromuscular dysfunction of bladder, unspecified: Secondary | ICD-10-CM | POA: Diagnosis not present

## 2015-10-03 DIAGNOSIS — G309 Alzheimer's disease, unspecified: Secondary | ICD-10-CM | POA: Diagnosis not present

## 2015-10-03 DIAGNOSIS — I10 Essential (primary) hypertension: Secondary | ICD-10-CM | POA: Diagnosis not present

## 2015-10-12 DIAGNOSIS — N319 Neuromuscular dysfunction of bladder, unspecified: Secondary | ICD-10-CM | POA: Diagnosis not present

## 2015-10-12 DIAGNOSIS — F0280 Dementia in other diseases classified elsewhere without behavioral disturbance: Secondary | ICD-10-CM | POA: Diagnosis not present

## 2015-10-12 DIAGNOSIS — Z466 Encounter for fitting and adjustment of urinary device: Secondary | ICD-10-CM | POA: Diagnosis not present

## 2015-10-12 DIAGNOSIS — G309 Alzheimer's disease, unspecified: Secondary | ICD-10-CM | POA: Diagnosis not present

## 2015-10-12 DIAGNOSIS — R32 Unspecified urinary incontinence: Secondary | ICD-10-CM | POA: Diagnosis not present

## 2015-10-12 DIAGNOSIS — I10 Essential (primary) hypertension: Secondary | ICD-10-CM | POA: Diagnosis not present

## 2015-10-13 DIAGNOSIS — G309 Alzheimer's disease, unspecified: Secondary | ICD-10-CM | POA: Diagnosis not present

## 2015-10-13 DIAGNOSIS — F0280 Dementia in other diseases classified elsewhere without behavioral disturbance: Secondary | ICD-10-CM | POA: Diagnosis not present

## 2015-10-13 DIAGNOSIS — N319 Neuromuscular dysfunction of bladder, unspecified: Secondary | ICD-10-CM | POA: Diagnosis not present

## 2015-10-13 DIAGNOSIS — N2889 Other specified disorders of kidney and ureter: Secondary | ICD-10-CM | POA: Diagnosis not present

## 2015-10-13 DIAGNOSIS — I10 Essential (primary) hypertension: Secondary | ICD-10-CM | POA: Diagnosis not present

## 2015-10-13 DIAGNOSIS — Z466 Encounter for fitting and adjustment of urinary device: Secondary | ICD-10-CM | POA: Diagnosis not present

## 2015-10-15 DIAGNOSIS — D333 Benign neoplasm of cranial nerves: Secondary | ICD-10-CM | POA: Diagnosis not present

## 2015-10-15 DIAGNOSIS — Z23 Encounter for immunization: Secondary | ICD-10-CM | POA: Diagnosis not present

## 2015-10-15 DIAGNOSIS — C50919 Malignant neoplasm of unspecified site of unspecified female breast: Secondary | ICD-10-CM | POA: Diagnosis not present

## 2015-10-15 DIAGNOSIS — C229 Malignant neoplasm of liver, not specified as primary or secondary: Secondary | ICD-10-CM | POA: Diagnosis not present

## 2015-10-15 DIAGNOSIS — I10 Essential (primary) hypertension: Secondary | ICD-10-CM | POA: Diagnosis not present

## 2015-10-15 DIAGNOSIS — E7800 Pure hypercholesterolemia, unspecified: Secondary | ICD-10-CM | POA: Diagnosis not present

## 2015-10-15 DIAGNOSIS — F0390 Unspecified dementia without behavioral disturbance: Secondary | ICD-10-CM | POA: Diagnosis not present

## 2015-10-15 DIAGNOSIS — D214 Benign neoplasm of connective and other soft tissue of abdomen: Secondary | ICD-10-CM | POA: Diagnosis not present

## 2015-10-17 DIAGNOSIS — H04121 Dry eye syndrome of right lacrimal gland: Secondary | ICD-10-CM | POA: Diagnosis not present

## 2015-10-17 DIAGNOSIS — H16101 Unspecified superficial keratitis, right eye: Secondary | ICD-10-CM | POA: Diagnosis not present

## 2015-10-17 DIAGNOSIS — Z961 Presence of intraocular lens: Secondary | ICD-10-CM | POA: Diagnosis not present

## 2015-10-17 DIAGNOSIS — H1131 Conjunctival hemorrhage, right eye: Secondary | ICD-10-CM | POA: Diagnosis not present

## 2015-10-19 DIAGNOSIS — N319 Neuromuscular dysfunction of bladder, unspecified: Secondary | ICD-10-CM | POA: Diagnosis not present

## 2015-10-19 DIAGNOSIS — G309 Alzheimer's disease, unspecified: Secondary | ICD-10-CM | POA: Diagnosis not present

## 2015-10-19 DIAGNOSIS — F0280 Dementia in other diseases classified elsewhere without behavioral disturbance: Secondary | ICD-10-CM | POA: Diagnosis not present

## 2015-10-19 DIAGNOSIS — I10 Essential (primary) hypertension: Secondary | ICD-10-CM | POA: Diagnosis not present

## 2015-10-19 DIAGNOSIS — N2889 Other specified disorders of kidney and ureter: Secondary | ICD-10-CM | POA: Diagnosis not present

## 2015-10-19 DIAGNOSIS — Z466 Encounter for fitting and adjustment of urinary device: Secondary | ICD-10-CM | POA: Diagnosis not present

## 2015-10-22 DIAGNOSIS — G309 Alzheimer's disease, unspecified: Secondary | ICD-10-CM | POA: Diagnosis not present

## 2015-10-22 DIAGNOSIS — I10 Essential (primary) hypertension: Secondary | ICD-10-CM | POA: Diagnosis not present

## 2015-10-22 DIAGNOSIS — N319 Neuromuscular dysfunction of bladder, unspecified: Secondary | ICD-10-CM | POA: Diagnosis not present

## 2015-10-22 DIAGNOSIS — Z466 Encounter for fitting and adjustment of urinary device: Secondary | ICD-10-CM | POA: Diagnosis not present

## 2015-10-22 DIAGNOSIS — F0280 Dementia in other diseases classified elsewhere without behavioral disturbance: Secondary | ICD-10-CM | POA: Diagnosis not present

## 2015-10-22 DIAGNOSIS — N390 Urinary tract infection, site not specified: Secondary | ICD-10-CM | POA: Diagnosis not present

## 2015-10-22 DIAGNOSIS — N2889 Other specified disorders of kidney and ureter: Secondary | ICD-10-CM | POA: Diagnosis not present

## 2015-10-30 DIAGNOSIS — R413 Other amnesia: Secondary | ICD-10-CM | POA: Diagnosis not present

## 2015-10-30 DIAGNOSIS — Z9011 Acquired absence of right breast and nipple: Secondary | ICD-10-CM | POA: Diagnosis not present

## 2015-10-30 DIAGNOSIS — F0390 Unspecified dementia without behavioral disturbance: Secondary | ICD-10-CM | POA: Diagnosis not present

## 2015-10-30 DIAGNOSIS — Z853 Personal history of malignant neoplasm of breast: Secondary | ICD-10-CM | POA: Diagnosis not present

## 2015-10-30 DIAGNOSIS — C49A3 Gastrointestinal stromal tumor of small intestine: Secondary | ICD-10-CM | POA: Diagnosis not present

## 2015-10-30 DIAGNOSIS — Z5111 Encounter for antineoplastic chemotherapy: Secondary | ICD-10-CM | POA: Diagnosis not present

## 2015-10-30 DIAGNOSIS — N2889 Other specified disorders of kidney and ureter: Secondary | ICD-10-CM | POA: Diagnosis not present

## 2015-10-30 DIAGNOSIS — Z86011 Personal history of benign neoplasm of the brain: Secondary | ICD-10-CM | POA: Diagnosis not present

## 2015-10-30 DIAGNOSIS — Z880 Allergy status to penicillin: Secondary | ICD-10-CM | POA: Diagnosis not present

## 2015-10-30 DIAGNOSIS — H04123 Dry eye syndrome of bilateral lacrimal glands: Secondary | ICD-10-CM | POA: Diagnosis not present

## 2015-11-15 DIAGNOSIS — R21 Rash and other nonspecific skin eruption: Secondary | ICD-10-CM | POA: Diagnosis not present

## 2015-11-15 DIAGNOSIS — B372 Candidiasis of skin and nail: Secondary | ICD-10-CM | POA: Diagnosis not present

## 2015-11-23 DIAGNOSIS — I10 Essential (primary) hypertension: Secondary | ICD-10-CM | POA: Diagnosis not present

## 2015-11-23 DIAGNOSIS — F0280 Dementia in other diseases classified elsewhere without behavioral disturbance: Secondary | ICD-10-CM | POA: Diagnosis not present

## 2015-11-23 DIAGNOSIS — N2889 Other specified disorders of kidney and ureter: Secondary | ICD-10-CM | POA: Diagnosis not present

## 2015-11-23 DIAGNOSIS — Z466 Encounter for fitting and adjustment of urinary device: Secondary | ICD-10-CM | POA: Diagnosis not present

## 2015-11-23 DIAGNOSIS — G309 Alzheimer's disease, unspecified: Secondary | ICD-10-CM | POA: Diagnosis not present

## 2015-11-23 DIAGNOSIS — N319 Neuromuscular dysfunction of bladder, unspecified: Secondary | ICD-10-CM | POA: Diagnosis not present

## 2015-11-27 DIAGNOSIS — N2889 Other specified disorders of kidney and ureter: Secondary | ICD-10-CM | POA: Diagnosis not present

## 2015-11-27 DIAGNOSIS — G309 Alzheimer's disease, unspecified: Secondary | ICD-10-CM | POA: Diagnosis not present

## 2015-11-27 DIAGNOSIS — I10 Essential (primary) hypertension: Secondary | ICD-10-CM | POA: Diagnosis not present

## 2015-11-27 DIAGNOSIS — F0280 Dementia in other diseases classified elsewhere without behavioral disturbance: Secondary | ICD-10-CM | POA: Diagnosis not present

## 2015-11-27 DIAGNOSIS — N319 Neuromuscular dysfunction of bladder, unspecified: Secondary | ICD-10-CM | POA: Diagnosis not present

## 2015-11-27 DIAGNOSIS — Z466 Encounter for fitting and adjustment of urinary device: Secondary | ICD-10-CM | POA: Diagnosis not present

## 2015-12-01 DIAGNOSIS — N2889 Other specified disorders of kidney and ureter: Secondary | ICD-10-CM | POA: Diagnosis not present

## 2015-12-01 DIAGNOSIS — G309 Alzheimer's disease, unspecified: Secondary | ICD-10-CM | POA: Diagnosis not present

## 2015-12-01 DIAGNOSIS — I10 Essential (primary) hypertension: Secondary | ICD-10-CM | POA: Diagnosis not present

## 2015-12-01 DIAGNOSIS — Z466 Encounter for fitting and adjustment of urinary device: Secondary | ICD-10-CM | POA: Diagnosis not present

## 2015-12-01 DIAGNOSIS — N319 Neuromuscular dysfunction of bladder, unspecified: Secondary | ICD-10-CM | POA: Diagnosis not present

## 2015-12-01 DIAGNOSIS — F0280 Dementia in other diseases classified elsewhere without behavioral disturbance: Secondary | ICD-10-CM | POA: Diagnosis not present

## 2015-12-04 DIAGNOSIS — N2889 Other specified disorders of kidney and ureter: Secondary | ICD-10-CM | POA: Diagnosis not present

## 2015-12-04 DIAGNOSIS — F0280 Dementia in other diseases classified elsewhere without behavioral disturbance: Secondary | ICD-10-CM | POA: Diagnosis not present

## 2015-12-04 DIAGNOSIS — G309 Alzheimer's disease, unspecified: Secondary | ICD-10-CM | POA: Diagnosis not present

## 2015-12-04 DIAGNOSIS — Z466 Encounter for fitting and adjustment of urinary device: Secondary | ICD-10-CM | POA: Diagnosis not present

## 2015-12-04 DIAGNOSIS — I10 Essential (primary) hypertension: Secondary | ICD-10-CM | POA: Diagnosis not present

## 2015-12-04 DIAGNOSIS — N319 Neuromuscular dysfunction of bladder, unspecified: Secondary | ICD-10-CM | POA: Diagnosis not present

## 2015-12-07 DIAGNOSIS — C49A0 Gastrointestinal stromal tumor, unspecified site: Secondary | ICD-10-CM | POA: Diagnosis not present

## 2015-12-07 DIAGNOSIS — I10 Essential (primary) hypertension: Secondary | ICD-10-CM | POA: Diagnosis not present

## 2015-12-07 DIAGNOSIS — R413 Other amnesia: Secondary | ICD-10-CM | POA: Diagnosis not present

## 2015-12-07 DIAGNOSIS — G301 Alzheimer's disease with late onset: Secondary | ICD-10-CM | POA: Diagnosis not present

## 2015-12-07 DIAGNOSIS — Z880 Allergy status to penicillin: Secondary | ICD-10-CM | POA: Diagnosis not present

## 2015-12-07 DIAGNOSIS — F0280 Dementia in other diseases classified elsewhere without behavioral disturbance: Secondary | ICD-10-CM | POA: Diagnosis not present

## 2015-12-07 DIAGNOSIS — Z853 Personal history of malignant neoplasm of breast: Secondary | ICD-10-CM | POA: Diagnosis not present

## 2015-12-07 DIAGNOSIS — Z79899 Other long term (current) drug therapy: Secondary | ICD-10-CM | POA: Diagnosis not present

## 2015-12-07 DIAGNOSIS — Z9010 Acquired absence of unspecified breast and nipple: Secondary | ICD-10-CM | POA: Diagnosis not present

## 2015-12-07 DIAGNOSIS — G510 Bell's palsy: Secondary | ICD-10-CM | POA: Diagnosis not present

## 2015-12-12 DIAGNOSIS — G309 Alzheimer's disease, unspecified: Secondary | ICD-10-CM | POA: Diagnosis not present

## 2015-12-12 DIAGNOSIS — N319 Neuromuscular dysfunction of bladder, unspecified: Secondary | ICD-10-CM | POA: Diagnosis not present

## 2015-12-12 DIAGNOSIS — I10 Essential (primary) hypertension: Secondary | ICD-10-CM | POA: Diagnosis not present

## 2015-12-12 DIAGNOSIS — N2889 Other specified disorders of kidney and ureter: Secondary | ICD-10-CM | POA: Diagnosis not present

## 2015-12-12 DIAGNOSIS — F0280 Dementia in other diseases classified elsewhere without behavioral disturbance: Secondary | ICD-10-CM | POA: Diagnosis not present

## 2015-12-12 DIAGNOSIS — Z466 Encounter for fitting and adjustment of urinary device: Secondary | ICD-10-CM | POA: Diagnosis not present

## 2015-12-18 DIAGNOSIS — Z993 Dependence on wheelchair: Secondary | ICD-10-CM | POA: Diagnosis not present

## 2015-12-18 DIAGNOSIS — I10 Essential (primary) hypertension: Secondary | ICD-10-CM | POA: Diagnosis not present

## 2015-12-19 DIAGNOSIS — Z466 Encounter for fitting and adjustment of urinary device: Secondary | ICD-10-CM | POA: Diagnosis not present

## 2015-12-19 DIAGNOSIS — N319 Neuromuscular dysfunction of bladder, unspecified: Secondary | ICD-10-CM | POA: Diagnosis not present

## 2015-12-19 DIAGNOSIS — G309 Alzheimer's disease, unspecified: Secondary | ICD-10-CM | POA: Diagnosis not present

## 2015-12-19 DIAGNOSIS — F0280 Dementia in other diseases classified elsewhere without behavioral disturbance: Secondary | ICD-10-CM | POA: Diagnosis not present

## 2015-12-19 DIAGNOSIS — N2889 Other specified disorders of kidney and ureter: Secondary | ICD-10-CM | POA: Diagnosis not present

## 2015-12-19 DIAGNOSIS — I10 Essential (primary) hypertension: Secondary | ICD-10-CM | POA: Diagnosis not present

## 2015-12-25 DIAGNOSIS — Z466 Encounter for fitting and adjustment of urinary device: Secondary | ICD-10-CM | POA: Diagnosis not present

## 2015-12-25 DIAGNOSIS — I10 Essential (primary) hypertension: Secondary | ICD-10-CM | POA: Diagnosis not present

## 2015-12-25 DIAGNOSIS — N319 Neuromuscular dysfunction of bladder, unspecified: Secondary | ICD-10-CM | POA: Diagnosis not present

## 2015-12-25 DIAGNOSIS — F0280 Dementia in other diseases classified elsewhere without behavioral disturbance: Secondary | ICD-10-CM | POA: Diagnosis not present

## 2015-12-25 DIAGNOSIS — N2889 Other specified disorders of kidney and ureter: Secondary | ICD-10-CM | POA: Diagnosis not present

## 2015-12-25 DIAGNOSIS — G309 Alzheimer's disease, unspecified: Secondary | ICD-10-CM | POA: Diagnosis not present

## 2016-01-04 DIAGNOSIS — I10 Essential (primary) hypertension: Secondary | ICD-10-CM | POA: Diagnosis not present

## 2016-01-04 DIAGNOSIS — F0280 Dementia in other diseases classified elsewhere without behavioral disturbance: Secondary | ICD-10-CM | POA: Diagnosis not present

## 2016-01-04 DIAGNOSIS — N319 Neuromuscular dysfunction of bladder, unspecified: Secondary | ICD-10-CM | POA: Diagnosis not present

## 2016-01-04 DIAGNOSIS — Z466 Encounter for fitting and adjustment of urinary device: Secondary | ICD-10-CM | POA: Diagnosis not present

## 2016-01-04 DIAGNOSIS — G309 Alzheimer's disease, unspecified: Secondary | ICD-10-CM | POA: Diagnosis not present

## 2016-01-04 DIAGNOSIS — N2889 Other specified disorders of kidney and ureter: Secondary | ICD-10-CM | POA: Diagnosis not present

## 2016-01-12 DIAGNOSIS — G309 Alzheimer's disease, unspecified: Secondary | ICD-10-CM | POA: Diagnosis not present

## 2016-01-12 DIAGNOSIS — N2889 Other specified disorders of kidney and ureter: Secondary | ICD-10-CM | POA: Diagnosis not present

## 2016-01-12 DIAGNOSIS — N319 Neuromuscular dysfunction of bladder, unspecified: Secondary | ICD-10-CM | POA: Diagnosis not present

## 2016-01-12 DIAGNOSIS — Z466 Encounter for fitting and adjustment of urinary device: Secondary | ICD-10-CM | POA: Diagnosis not present

## 2016-01-12 DIAGNOSIS — F0280 Dementia in other diseases classified elsewhere without behavioral disturbance: Secondary | ICD-10-CM | POA: Diagnosis not present

## 2016-01-12 DIAGNOSIS — I10 Essential (primary) hypertension: Secondary | ICD-10-CM | POA: Diagnosis not present

## 2016-01-14 DIAGNOSIS — Z13220 Encounter for screening for lipoid disorders: Secondary | ICD-10-CM | POA: Diagnosis not present

## 2016-01-14 DIAGNOSIS — C50919 Malignant neoplasm of unspecified site of unspecified female breast: Secondary | ICD-10-CM | POA: Diagnosis not present

## 2016-01-14 DIAGNOSIS — N2889 Other specified disorders of kidney and ureter: Secondary | ICD-10-CM | POA: Diagnosis not present

## 2016-01-14 DIAGNOSIS — F0280 Dementia in other diseases classified elsewhere without behavioral disturbance: Secondary | ICD-10-CM | POA: Diagnosis not present

## 2016-01-14 DIAGNOSIS — Z136 Encounter for screening for cardiovascular disorders: Secondary | ICD-10-CM | POA: Diagnosis not present

## 2016-01-14 DIAGNOSIS — N183 Chronic kidney disease, stage 3 (moderate): Secondary | ICD-10-CM | POA: Diagnosis not present

## 2016-01-14 DIAGNOSIS — D333 Benign neoplasm of cranial nerves: Secondary | ICD-10-CM | POA: Diagnosis not present

## 2016-01-14 DIAGNOSIS — G309 Alzheimer's disease, unspecified: Secondary | ICD-10-CM | POA: Diagnosis not present

## 2016-01-14 DIAGNOSIS — I10 Essential (primary) hypertension: Secondary | ICD-10-CM | POA: Diagnosis not present

## 2016-01-14 DIAGNOSIS — F0390 Unspecified dementia without behavioral disturbance: Secondary | ICD-10-CM | POA: Diagnosis not present

## 2016-01-14 DIAGNOSIS — N319 Neuromuscular dysfunction of bladder, unspecified: Secondary | ICD-10-CM | POA: Diagnosis not present

## 2016-01-14 DIAGNOSIS — E538 Deficiency of other specified B group vitamins: Secondary | ICD-10-CM | POA: Diagnosis not present

## 2016-01-14 DIAGNOSIS — Z466 Encounter for fitting and adjustment of urinary device: Secondary | ICD-10-CM | POA: Diagnosis not present

## 2016-01-14 DIAGNOSIS — D214 Benign neoplasm of connective and other soft tissue of abdomen: Secondary | ICD-10-CM | POA: Diagnosis not present

## 2016-01-14 DIAGNOSIS — R32 Unspecified urinary incontinence: Secondary | ICD-10-CM | POA: Diagnosis not present

## 2016-02-04 DIAGNOSIS — N2889 Other specified disorders of kidney and ureter: Secondary | ICD-10-CM | POA: Diagnosis not present

## 2016-02-10 DIAGNOSIS — I10 Essential (primary) hypertension: Secondary | ICD-10-CM | POA: Diagnosis not present

## 2016-02-10 DIAGNOSIS — Z466 Encounter for fitting and adjustment of urinary device: Secondary | ICD-10-CM | POA: Diagnosis not present

## 2016-02-10 DIAGNOSIS — G309 Alzheimer's disease, unspecified: Secondary | ICD-10-CM | POA: Diagnosis not present

## 2016-02-10 DIAGNOSIS — F0280 Dementia in other diseases classified elsewhere without behavioral disturbance: Secondary | ICD-10-CM | POA: Diagnosis not present

## 2016-02-10 DIAGNOSIS — N319 Neuromuscular dysfunction of bladder, unspecified: Secondary | ICD-10-CM | POA: Diagnosis not present

## 2016-02-10 DIAGNOSIS — N2889 Other specified disorders of kidney and ureter: Secondary | ICD-10-CM | POA: Diagnosis not present

## 2016-02-12 DIAGNOSIS — Z466 Encounter for fitting and adjustment of urinary device: Secondary | ICD-10-CM | POA: Diagnosis not present

## 2016-02-12 DIAGNOSIS — I10 Essential (primary) hypertension: Secondary | ICD-10-CM | POA: Diagnosis not present

## 2016-02-12 DIAGNOSIS — F0280 Dementia in other diseases classified elsewhere without behavioral disturbance: Secondary | ICD-10-CM | POA: Diagnosis not present

## 2016-02-12 DIAGNOSIS — N2889 Other specified disorders of kidney and ureter: Secondary | ICD-10-CM | POA: Diagnosis not present

## 2016-02-12 DIAGNOSIS — R319 Hematuria, unspecified: Secondary | ICD-10-CM | POA: Diagnosis not present

## 2016-02-12 DIAGNOSIS — G309 Alzheimer's disease, unspecified: Secondary | ICD-10-CM | POA: Diagnosis not present

## 2016-02-12 DIAGNOSIS — N319 Neuromuscular dysfunction of bladder, unspecified: Secondary | ICD-10-CM | POA: Diagnosis not present

## 2016-02-13 DIAGNOSIS — N2889 Other specified disorders of kidney and ureter: Secondary | ICD-10-CM | POA: Diagnosis not present

## 2016-02-13 DIAGNOSIS — Z466 Encounter for fitting and adjustment of urinary device: Secondary | ICD-10-CM | POA: Diagnosis not present

## 2016-02-13 DIAGNOSIS — N319 Neuromuscular dysfunction of bladder, unspecified: Secondary | ICD-10-CM | POA: Diagnosis not present

## 2016-02-13 DIAGNOSIS — F0280 Dementia in other diseases classified elsewhere without behavioral disturbance: Secondary | ICD-10-CM | POA: Diagnosis not present

## 2016-02-13 DIAGNOSIS — G309 Alzheimer's disease, unspecified: Secondary | ICD-10-CM | POA: Diagnosis not present

## 2016-02-13 DIAGNOSIS — I10 Essential (primary) hypertension: Secondary | ICD-10-CM | POA: Diagnosis not present

## 2016-03-02 DIAGNOSIS — N319 Neuromuscular dysfunction of bladder, unspecified: Secondary | ICD-10-CM | POA: Diagnosis not present

## 2016-03-02 DIAGNOSIS — G309 Alzheimer's disease, unspecified: Secondary | ICD-10-CM | POA: Diagnosis not present

## 2016-03-02 DIAGNOSIS — N2889 Other specified disorders of kidney and ureter: Secondary | ICD-10-CM | POA: Diagnosis not present

## 2016-03-02 DIAGNOSIS — Z466 Encounter for fitting and adjustment of urinary device: Secondary | ICD-10-CM | POA: Diagnosis not present

## 2016-03-02 DIAGNOSIS — I10 Essential (primary) hypertension: Secondary | ICD-10-CM | POA: Diagnosis not present

## 2016-03-02 DIAGNOSIS — F0280 Dementia in other diseases classified elsewhere without behavioral disturbance: Secondary | ICD-10-CM | POA: Diagnosis not present

## 2016-03-04 DIAGNOSIS — Z5111 Encounter for antineoplastic chemotherapy: Secondary | ICD-10-CM | POA: Diagnosis not present

## 2016-03-04 DIAGNOSIS — R200 Anesthesia of skin: Secondary | ICD-10-CM | POA: Diagnosis not present

## 2016-03-04 DIAGNOSIS — Z9011 Acquired absence of right breast and nipple: Secondary | ICD-10-CM | POA: Diagnosis not present

## 2016-03-04 DIAGNOSIS — K8689 Other specified diseases of pancreas: Secondary | ICD-10-CM | POA: Diagnosis not present

## 2016-03-04 DIAGNOSIS — H04123 Dry eye syndrome of bilateral lacrimal glands: Secondary | ICD-10-CM | POA: Diagnosis not present

## 2016-03-04 DIAGNOSIS — Z880 Allergy status to penicillin: Secondary | ICD-10-CM | POA: Diagnosis not present

## 2016-03-04 DIAGNOSIS — N2889 Other specified disorders of kidney and ureter: Secondary | ICD-10-CM | POA: Diagnosis not present

## 2016-03-04 DIAGNOSIS — K838 Other specified diseases of biliary tract: Secondary | ICD-10-CM | POA: Diagnosis not present

## 2016-03-04 DIAGNOSIS — I10 Essential (primary) hypertension: Secondary | ICD-10-CM | POA: Diagnosis not present

## 2016-03-04 DIAGNOSIS — R413 Other amnesia: Secondary | ICD-10-CM | POA: Diagnosis not present

## 2016-03-04 DIAGNOSIS — C49A3 Gastrointestinal stromal tumor of small intestine: Secondary | ICD-10-CM | POA: Diagnosis not present

## 2016-03-04 DIAGNOSIS — F0390 Unspecified dementia without behavioral disturbance: Secondary | ICD-10-CM | POA: Diagnosis not present

## 2016-03-04 DIAGNOSIS — R29810 Facial weakness: Secondary | ICD-10-CM | POA: Diagnosis not present

## 2016-03-04 DIAGNOSIS — Z86011 Personal history of benign neoplasm of the brain: Secondary | ICD-10-CM | POA: Diagnosis not present

## 2016-03-04 DIAGNOSIS — Z853 Personal history of malignant neoplasm of breast: Secondary | ICD-10-CM | POA: Diagnosis not present

## 2016-03-11 DIAGNOSIS — C49A3 Gastrointestinal stromal tumor of small intestine: Secondary | ICD-10-CM | POA: Diagnosis not present

## 2016-03-11 DIAGNOSIS — Z466 Encounter for fitting and adjustment of urinary device: Secondary | ICD-10-CM | POA: Diagnosis not present

## 2016-03-11 DIAGNOSIS — I10 Essential (primary) hypertension: Secondary | ICD-10-CM | POA: Diagnosis not present

## 2016-03-11 DIAGNOSIS — N2889 Other specified disorders of kidney and ureter: Secondary | ICD-10-CM | POA: Diagnosis not present

## 2016-03-11 DIAGNOSIS — R7989 Other specified abnormal findings of blood chemistry: Secondary | ICD-10-CM | POA: Diagnosis not present

## 2016-03-11 DIAGNOSIS — F0280 Dementia in other diseases classified elsewhere without behavioral disturbance: Secondary | ICD-10-CM | POA: Diagnosis not present

## 2016-03-11 DIAGNOSIS — G309 Alzheimer's disease, unspecified: Secondary | ICD-10-CM | POA: Diagnosis not present

## 2016-03-11 DIAGNOSIS — N319 Neuromuscular dysfunction of bladder, unspecified: Secondary | ICD-10-CM | POA: Diagnosis not present

## 2016-03-18 DIAGNOSIS — N319 Neuromuscular dysfunction of bladder, unspecified: Secondary | ICD-10-CM | POA: Diagnosis not present

## 2016-03-18 DIAGNOSIS — C49A3 Gastrointestinal stromal tumor of small intestine: Secondary | ICD-10-CM | POA: Diagnosis not present

## 2016-03-18 DIAGNOSIS — R7989 Other specified abnormal findings of blood chemistry: Secondary | ICD-10-CM | POA: Diagnosis not present

## 2016-03-18 DIAGNOSIS — Z466 Encounter for fitting and adjustment of urinary device: Secondary | ICD-10-CM | POA: Diagnosis not present

## 2016-03-18 DIAGNOSIS — F0280 Dementia in other diseases classified elsewhere without behavioral disturbance: Secondary | ICD-10-CM | POA: Diagnosis not present

## 2016-03-18 DIAGNOSIS — N2889 Other specified disorders of kidney and ureter: Secondary | ICD-10-CM | POA: Diagnosis not present

## 2016-03-18 DIAGNOSIS — I10 Essential (primary) hypertension: Secondary | ICD-10-CM | POA: Diagnosis not present

## 2016-03-18 DIAGNOSIS — G309 Alzheimer's disease, unspecified: Secondary | ICD-10-CM | POA: Diagnosis not present

## 2016-03-25 DIAGNOSIS — G309 Alzheimer's disease, unspecified: Secondary | ICD-10-CM | POA: Diagnosis not present

## 2016-03-25 DIAGNOSIS — F0280 Dementia in other diseases classified elsewhere without behavioral disturbance: Secondary | ICD-10-CM | POA: Diagnosis not present

## 2016-03-25 DIAGNOSIS — N2889 Other specified disorders of kidney and ureter: Secondary | ICD-10-CM | POA: Diagnosis not present

## 2016-03-25 DIAGNOSIS — N319 Neuromuscular dysfunction of bladder, unspecified: Secondary | ICD-10-CM | POA: Diagnosis not present

## 2016-03-25 DIAGNOSIS — I10 Essential (primary) hypertension: Secondary | ICD-10-CM | POA: Diagnosis not present

## 2016-03-25 DIAGNOSIS — Z466 Encounter for fitting and adjustment of urinary device: Secondary | ICD-10-CM | POA: Diagnosis not present

## 2016-03-25 DIAGNOSIS — C49A3 Gastrointestinal stromal tumor of small intestine: Secondary | ICD-10-CM | POA: Diagnosis not present

## 2016-03-26 DIAGNOSIS — N319 Neuromuscular dysfunction of bladder, unspecified: Secondary | ICD-10-CM | POA: Diagnosis not present

## 2016-03-26 DIAGNOSIS — N2889 Other specified disorders of kidney and ureter: Secondary | ICD-10-CM | POA: Diagnosis not present

## 2016-03-26 DIAGNOSIS — F0280 Dementia in other diseases classified elsewhere without behavioral disturbance: Secondary | ICD-10-CM | POA: Diagnosis not present

## 2016-03-26 DIAGNOSIS — G309 Alzheimer's disease, unspecified: Secondary | ICD-10-CM | POA: Diagnosis not present

## 2016-03-26 DIAGNOSIS — I10 Essential (primary) hypertension: Secondary | ICD-10-CM | POA: Diagnosis not present

## 2016-03-26 DIAGNOSIS — Z466 Encounter for fitting and adjustment of urinary device: Secondary | ICD-10-CM | POA: Diagnosis not present

## 2016-03-28 DIAGNOSIS — G510 Bell's palsy: Secondary | ICD-10-CM | POA: Diagnosis not present

## 2016-03-28 DIAGNOSIS — H1131 Conjunctival hemorrhage, right eye: Secondary | ICD-10-CM | POA: Diagnosis not present

## 2016-03-28 DIAGNOSIS — Z961 Presence of intraocular lens: Secondary | ICD-10-CM | POA: Diagnosis not present

## 2016-03-28 DIAGNOSIS — H16211 Exposure keratoconjunctivitis, right eye: Secondary | ICD-10-CM | POA: Diagnosis not present

## 2016-04-01 DIAGNOSIS — N319 Neuromuscular dysfunction of bladder, unspecified: Secondary | ICD-10-CM | POA: Diagnosis not present

## 2016-04-01 DIAGNOSIS — Z466 Encounter for fitting and adjustment of urinary device: Secondary | ICD-10-CM | POA: Diagnosis not present

## 2016-04-01 DIAGNOSIS — C49A3 Gastrointestinal stromal tumor of small intestine: Secondary | ICD-10-CM | POA: Diagnosis not present

## 2016-04-01 DIAGNOSIS — I10 Essential (primary) hypertension: Secondary | ICD-10-CM | POA: Diagnosis not present

## 2016-04-01 DIAGNOSIS — G309 Alzheimer's disease, unspecified: Secondary | ICD-10-CM | POA: Diagnosis not present

## 2016-04-01 DIAGNOSIS — N2889 Other specified disorders of kidney and ureter: Secondary | ICD-10-CM | POA: Diagnosis not present

## 2016-04-01 DIAGNOSIS — F0280 Dementia in other diseases classified elsewhere without behavioral disturbance: Secondary | ICD-10-CM | POA: Diagnosis not present

## 2016-04-05 ENCOUNTER — Ambulatory Visit (INDEPENDENT_AMBULATORY_CARE_PROVIDER_SITE_OTHER): Payer: Medicare Other | Admitting: Emergency Medicine

## 2016-04-05 VITALS — BP 100/60 | HR 55 | Temp 97.6°F | Resp 16

## 2016-04-05 DIAGNOSIS — C220 Liver cell carcinoma: Secondary | ICD-10-CM

## 2016-04-05 DIAGNOSIS — R112 Nausea with vomiting, unspecified: Secondary | ICD-10-CM

## 2016-04-05 DIAGNOSIS — K8020 Calculus of gallbladder without cholecystitis without obstruction: Secondary | ICD-10-CM | POA: Diagnosis not present

## 2016-04-05 DIAGNOSIS — T83511A Infection and inflammatory reaction due to indwelling urethral catheter, initial encounter: Secondary | ICD-10-CM | POA: Diagnosis not present

## 2016-04-05 DIAGNOSIS — R197 Diarrhea, unspecified: Secondary | ICD-10-CM | POA: Diagnosis not present

## 2016-04-05 LAB — POCT CBC
Granulocyte percent: 87.9 % — AB (ref 37–80)
HCT, POC: 31.2 % — AB (ref 37.7–47.9)
Hemoglobin: 11.2 g/dL — AB (ref 12.2–16.2)
Lymph, poc: 1 (ref 0.6–3.4)
MCH, POC: 32.4 pg — AB (ref 27–31.2)
MCHC: 35.9 g/dL — AB (ref 31.8–35.4)
MCV: 90.1 fL (ref 80–97)
MID (cbc): 0.7 (ref 0–0.9)
MPV: 8.2 fL (ref 0–99.8)
POC Granulocyte: 12.1 — AB (ref 2–6.9)
POC LYMPH PERCENT: 7 % — AB (ref 10–50)
POC MID %: 5.1 % (ref 0–12)
Platelet Count, POC: 138 K/uL — AB (ref 142–424)
RBC: 3.47 M/uL — AB (ref 4.04–5.48)
RDW, POC: 14.4 %
WBC: 13.8 K/uL — AB (ref 4.6–10.2)

## 2016-04-05 LAB — POC MICROSCOPIC URINALYSIS (UMFC): Mucus: ABSENT

## 2016-04-05 LAB — COMPLETE METABOLIC PANEL WITHOUT GFR
ALT: 173 U/L — ABNORMAL HIGH (ref 6–29)
AST: 233 U/L — ABNORMAL HIGH (ref 10–35)
Albumin: 3.3 g/dL — ABNORMAL LOW (ref 3.6–5.1)
Alkaline Phosphatase: 235 U/L — ABNORMAL HIGH (ref 33–130)
BUN: 20 mg/dL (ref 7–25)
CO2: 25 mmol/L (ref 20–31)
Calcium: 8.6 mg/dL (ref 8.6–10.4)
Chloride: 99 mmol/L (ref 98–110)
Creat: 1.12 mg/dL — ABNORMAL HIGH (ref 0.60–0.88)
GFR, Est African American: 52 mL/min — ABNORMAL LOW
GFR, Est Non African American: 45 mL/min — ABNORMAL LOW
Glucose, Bld: 121 mg/dL — ABNORMAL HIGH (ref 65–99)
Potassium: 4.6 mmol/L (ref 3.5–5.3)
Sodium: 135 mmol/L (ref 135–146)
Total Bilirubin: 3 mg/dL — ABNORMAL HIGH (ref 0.2–1.2)
Total Protein: 5.7 g/dL — ABNORMAL LOW (ref 6.1–8.1)

## 2016-04-05 LAB — POCT URINALYSIS DIP (MANUAL ENTRY)
Bilirubin, UA: NEGATIVE
Glucose, UA: NEGATIVE
Ketones, POC UA: NEGATIVE
Nitrite, UA: NEGATIVE
Spec Grav, UA: <=1.005
Urobilinogen, UA: 0.2
pH, UA: 6

## 2016-04-05 MED ORDER — CIPROFLOXACIN HCL 250 MG PO TABS: 250.0000 mg | ORAL_TABLET | Freq: Two times a day (BID) | ORAL | Status: DC

## 2016-04-05 NOTE — Patient Instructions (Signed)
     IF you received an x-ray today, you will receive an invoice from [facility]. Please contact [facility] at [phone] with questions or concerns regarding your invoice.   IF you received labwork today, you will receive an invoice from [facility]. Please contact [facility] at [phone] with questions or concerns regarding your invoice.   Our billing staff will not be able to assist you with questions regarding bills from these companies.  You will be contacted with the lab results as soon as they are available. The fastest way to get your results is to activate your My Chart account. Instructions are located on the last page of this paperwork. If you have not heard from us regarding the results in 2 weeks, please contact this office.      

## 2016-04-05 NOTE — Progress Notes (Addendum)
By signing my name below, I, Judithe Modest, attest that this documentation has been prepared under the direction and in the presence of Nena Jordan, MD. Electronically Signed: Judithe Modest, ER Scribe. 04/05/2016. 1:36 PM.  Chief Complaint:  Chief Complaint  Patient presents with  . Diarrhea    last night  . Emesis    last night  . Dark urine in her catheter    noticed this am  . fyi    has brain tumor has facial droop secondary to the brain tumor treated with gleevec    HPI: Melanie Trevino is a 82 y.o. female with a hx of dementia who reports to [facility] today complaining of vomiting and diarrhea that started last night, and stopped at 1am this morning. She is taking aricept. She receives her medical care primarily at [facility] She takes Gleevac for her past cancer diagnosis. She was briefly taking the generic medication. Following switching the medication she had a facial droop and numbness. She is also complaining of facial droop and numbness.    Past Medical History  Diagnosis Date  . Hypertension   . Cancer ([place])   . High cholesterol   . Wheelchair bound 07/08/2015  . Renal mass 07/08/2015  . Liver metastasis ([place]) 07/08/2015  . 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 Dementia of the Alzheimer's type 07/08/2015  . Chronic indwelling Foley catheter 07/08/2015  . Breast cancer ([place]) 08/18/2012   Past Surgical History  Procedure Laterality Date  . Mastectomy Right    Social History   Social History  . Marital Status: Widowed    Spouse Name: N/A  . Number of Children: N/A  . Years of Education: N/A   Social History Main Topics  . Smoking status: Never Smoker   . Smokeless tobacco: None  . Alcohol Use: None  . Drug Use: None  . Sexual Activity: Not Asked   Other Topics Concern  . None   Social History Narrative   History reviewed. No pertinent family history. Allergies  Allergen Reactions  . Shellfish Allergy Anaphylaxis  . Other Other (See Comments)   HORSE SERUMYDrug[Other] swelling6/09/2006 12:00:00 AM by Erin Hearing CPhT  . Amoxicillin Rash and Swelling   Prior to Admission medications   Medication Sig Start Date End Date Taking? Authorizing Provider  calcium carbonate (OS-CAL) 600 MG TABS tablet Take 600 mg by mouth 2 (two) times daily with a meal.   Yes Historical Provider, MD  donepezil (ARICEPT) 10 MG tablet Take 10 mg by mouth at bedtime.   Yes Historical Provider, MD  fluticasone (FLONASE) 50 MCG/ACT nasal spray Place 1 spray into both nostrils daily.  07/27/12  Yes Historical Provider, MD  furosemide (LASIX) 40 MG tablet Take 1 tablet (40 mg total) by mouth daily. 07/12/15  Yes Oswald Hillock, MD  Imatinib Mesylate (GLEEVEC PO) Take by mouth.   Yes Historical Provider, MD  losartan (COZAAR) 50 MG tablet Take 1 tablet (50 mg total) by mouth daily. 07/12/15  Yes Oswald Hillock, MD  methocarbamol (ROBAXIN) 500 MG tablet Take 500 mg by mouth every 8 (eight) hours as needed for muscle spasms.    Yes Historical Provider, MD  mirabegron ER (MYRBETRIQ) 50 MG TB24 tablet Take 50 mg by mouth daily.   Yes Historical Provider, MD  potassium chloride SA (K-DUR,KLOR-CON) 20 MEQ tablet Take 2 tablets (40 mEq total) by mouth daily. 07/12/15  Yes Oswald Hillock, MD  Spacer/Aero-Holding Dorise Bullion Use with MDI as  directed 01/17/14  Yes Roselee Culver, MD  vitamin B-12 (CYANOCOBALAMIN) 1000 MCG tablet Take 1,000 mcg by mouth 2 (two) times daily.    Yes Historical Provider, MD  albuterol (PROVENTIL HFA;VENTOLIN HFA) 108 (90 BASE) MCG/ACT inhaler Inhale 2 puffs into the lungs every 4 (four) hours as needed for wheezing or shortness of breath (cough, shortness of breath or wheezing.). Patient not taking: Reported on 04/05/2016 01/17/14   Roselee Culver, MD  docusate sodium (COLACE) 100 MG capsule Take 100 mg by mouth at bedtime. Reported on 04/05/2016    Historical Provider, MD  minocycline (DYNACIN) 50 MG tablet Take 50 mg by mouth at bedtime. Reported on  04/05/2016    Historical Provider, MD     ROS: The patient denies fevers, chills, night sweats, unintentional weight loss, chest pain, palpitations, wheezing, dyspnea on exertion, hematuria, melena, numbness, weakness, or tingling.   All other systems have been reviewed and were otherwise negative with the exception of those mentioned in the HPI and as above.    PHYSICAL EXAM: Filed Vitals:   04/05/16 1229  BP: 100/60  Pulse: 55  Temp: 97.6 F (36.4 C)  Resp: 16   There is no weight on file to calculate BMI.   General: Alert, no acute distress. Right facial paralysis HEENT:  Normocephalic, atraumatic, oropharynx patent. Eye: Juliette Mangle [facility] Cardiovascular:  Regular rate, no rubs murmurs or gallops.  No Carotid bruits, radial pulse intact. No pedal edema. Heart with occasional skipped beats Respiratory: Clear to auscultation bilaterally.  No wheezes, rales, or rhonchi.  No cyanosis, no use of accessory musculature Abdominal: No organomegaly, abdomen is soft and non-tender, positive bowel sounds.  No masses.  Musculoskeletal: Gait intact. No edema, tenderness Skin: No rashes. Neurologic: Facial musculature symmetric. Psychiatric: Patient acts appropriately throughout our interaction. Lymphatic: No cervical or submandibular lymphadenopathy     LABS: Results for orders placed or performed in visit on 04/05/16  POCT Microscopic Urinalysis ([facility])  Result Value Ref Range   WBC,UR,HPF,POC Moderate (A) None WBC/hpf   RBC,UR,HPF,POC Moderate (A) None RBC/hpf   Bacteria Many (A) None, Too numerous to count   Mucus Absent Absent   Epithelial Cells, UR Per Microscopy Few (A) None, Too numerous to count cells/hpf  POCT urinalysis dipstick  Result Value Ref Range   Color, UA yellow yellow   Clarity, UA cloudy (A) clear   Glucose, UA negative negative   Bilirubin, UA negative negative   Ketones, POC UA negative negative   Spec Grav, UA <=1.005    Blood, UA moderate (A) negative   pH,  UA 6.0    Protein Ur, POC trace (A) negative   Urobilinogen, UA 0.2    Nitrite, UA Negative Negative   Leukocytes, UA moderate (2+) (A) Negative  POCT CBC  Result Value Ref Range   WBC 13.8 (A) 4.6 - 10.2 K/uL   Lymph, poc 1.0 0.6 - 3.4   POC LYMPH PERCENT 7.0 (A) 10 - 50 %L   MID (cbc) 0.7 0 - 0.9   POC MID % 5.1 0 - 12 %M   POC Granulocyte 12.1 (A) 2 - 6.9   Granulocyte percent 87.9 (A) 37 - 80 %G   RBC 3.47 (A) 4.04 - 5.48 M/uL   Hemoglobin 11.2 (A) 12.2 - 16.2 g/dL   HCT, POC 31.2 (A) 37.7 - 47.9 %   MCV 90.1 80 - 97 fL   MCH, POC 32.4 (A) 27 - 31.2 pg   MCHC 35.9 (A) 31.8 -  35.4 g/dL   RDW, POC 14.4 %   Platelet Count, POC 138 (A) 142 - 424 K/uL   MPV 8.2 0 - 99.8 fL     EKG/XRAY:   Primary read interpreted by Dr. Everlene Farrier at [facility]   ASSESSMENT/PLAN: Patient appears to have a urinary tract infection. She also could be having difficulty with gallbladder. She had an acute episode of vomiting and diarrhea. I spoke with the son and the patient has significant dementia. She perseverates regarding use of a generic drug caused all of her problems. This has been resolved by her oncologist at [place]. Her urine will be cultured we'll go ahead and start on cipro pending culture results. I will go ahead and send off a GI pathogen to be sure she does not have C. difficile. I did not give her a shot of Rocephin because she has an amoxicillin allergy. Patient advised of vomiting diarrhea is persistent she needs to be seen in the emergency room.This was all discussed with her caregiver and her son Antony Haste.   Gross sideeffects, risk and benefits, and alternatives of medications d/w patient. Patient is aware that all medications have potential sideeffects and we are unable to predict every sideeffect or drug-drug interaction that may occur.  Arlyss Queen MD 04/05/2016 1:36 PM

## 2016-04-08 ENCOUNTER — Telehealth: Payer: Self-pay | Admitting: Emergency Medicine

## 2016-04-08 DIAGNOSIS — R7989 Other specified abnormal findings of blood chemistry: Secondary | ICD-10-CM

## 2016-04-08 DIAGNOSIS — C49A3 Gastrointestinal stromal tumor of small intestine: Secondary | ICD-10-CM | POA: Diagnosis not present

## 2016-04-08 DIAGNOSIS — R197 Diarrhea, unspecified: Secondary | ICD-10-CM | POA: Diagnosis not present

## 2016-04-08 DIAGNOSIS — F0280 Dementia in other diseases classified elsewhere without behavioral disturbance: Secondary | ICD-10-CM | POA: Diagnosis not present

## 2016-04-08 DIAGNOSIS — N2889 Other specified disorders of kidney and ureter: Secondary | ICD-10-CM | POA: Diagnosis not present

## 2016-04-08 DIAGNOSIS — I10 Essential (primary) hypertension: Secondary | ICD-10-CM | POA: Diagnosis not present

## 2016-04-08 DIAGNOSIS — G309 Alzheimer's disease, unspecified: Secondary | ICD-10-CM | POA: Diagnosis not present

## 2016-04-08 DIAGNOSIS — N319 Neuromuscular dysfunction of bladder, unspecified: Secondary | ICD-10-CM | POA: Diagnosis not present

## 2016-04-08 DIAGNOSIS — R8279 Other abnormal findings on microbiological examination of urine: Secondary | ICD-10-CM

## 2016-04-08 DIAGNOSIS — R945 Abnormal results of liver function studies: Principal | ICD-10-CM

## 2016-04-08 DIAGNOSIS — Z466 Encounter for fitting and adjustment of urinary device: Secondary | ICD-10-CM | POA: Diagnosis not present

## 2016-04-08 LAB — URINE CULTURE

## 2016-04-08 NOTE — Telephone Encounter (Signed)
Pt given urine results. States , she is scheduled to f/u with Dr. Kendall Flack in August Pt finished antibiotics today Pt will be in tomorrow for repeat urine culture and CMP Future orders placed

## 2016-04-08 NOTE — Telephone Encounter (Signed)
-----   Message from Darlyne Russian, MD sent at 04/07/2016  4:42 PM EDT ----- Culture with intermediate sensitivity. Continue the antibiotic she is on. Follow-up with Dr. Kendall Flack. Please send a copy to him at [facility].

## 2016-04-09 ENCOUNTER — Ambulatory Visit (INDEPENDENT_AMBULATORY_CARE_PROVIDER_SITE_OTHER): Payer: Medicare Other | Admitting: Emergency Medicine

## 2016-04-09 VITALS — BP 100/63 | HR 60 | Temp 97.8°F | Resp 18 | Ht 64.0 in

## 2016-04-09 DIAGNOSIS — R001 Bradycardia, unspecified: Secondary | ICD-10-CM | POA: Diagnosis not present

## 2016-04-09 DIAGNOSIS — T83511S Infection and inflammatory reaction due to indwelling urethral catheter, sequela: Secondary | ICD-10-CM | POA: Diagnosis not present

## 2016-04-09 DIAGNOSIS — R799 Abnormal finding of blood chemistry, unspecified: Secondary | ICD-10-CM | POA: Diagnosis not present

## 2016-04-09 DIAGNOSIS — R7989 Other specified abnormal findings of blood chemistry: Secondary | ICD-10-CM | POA: Diagnosis not present

## 2016-04-09 DIAGNOSIS — R9431 Abnormal electrocardiogram [ECG] [EKG]: Secondary | ICD-10-CM | POA: Diagnosis not present

## 2016-04-09 DIAGNOSIS — N390 Urinary tract infection, site not specified: Secondary | ICD-10-CM | POA: Diagnosis not present

## 2016-04-09 DIAGNOSIS — R945 Abnormal results of liver function studies: Secondary | ICD-10-CM

## 2016-04-09 LAB — POCT CBC
Granulocyte percent: 71 %G (ref 37–80)
HCT, POC: 30 % — AB (ref 37.7–47.9)
HEMOGLOBIN: 10.4 g/dL — AB (ref 12.2–16.2)
LYMPH, POC: 1.2 (ref 0.6–3.4)
MCH: 31.4 pg — AB (ref 27–31.2)
MCHC: 34.8 g/dL (ref 31.8–35.4)
MCV: 90.3 fL (ref 80–97)
MID (cbc): 0.2 (ref 0–0.9)
MPV: 7.6 fL (ref 0–99.8)
PLATELET COUNT, POC: 151 10*3/uL (ref 142–424)
POC Granulocyte: 3.5 (ref 2–6.9)
POC LYMPH PERCENT: 24.1 %L (ref 10–50)
POC MID %: 4.9 %M (ref 0–12)
RBC: 3.32 M/uL — AB (ref 4.04–5.48)
RDW, POC: 14.4 %
WBC: 4.9 10*3/uL (ref 4.6–10.2)

## 2016-04-09 LAB — COMPREHENSIVE METABOLIC PANEL
ALT: 72 U/L — AB (ref 6–29)
AST: 46 U/L — AB (ref 10–35)
Albumin: 3.1 g/dL — ABNORMAL LOW (ref 3.6–5.1)
Alkaline Phosphatase: 246 U/L — ABNORMAL HIGH (ref 33–130)
BUN: 25 mg/dL (ref 7–25)
CO2: 24 mmol/L (ref 20–31)
Calcium: 7.8 mg/dL — ABNORMAL LOW (ref 8.6–10.4)
Chloride: 104 mmol/L (ref 98–110)
Creat: 1.12 mg/dL — ABNORMAL HIGH (ref 0.60–0.88)
GLUCOSE: 97 mg/dL (ref 65–99)
POTASSIUM: 4.8 mmol/L (ref 3.5–5.3)
SODIUM: 136 mmol/L (ref 135–146)
Total Bilirubin: 1 mg/dL (ref 0.2–1.2)
Total Protein: 5.3 g/dL — ABNORMAL LOW (ref 6.1–8.1)

## 2016-04-09 LAB — GASTROINTESTINAL PATHOGEN PANEL PCR
C. DIFFICILE TOX A/B, PCR: NOT DETECTED
Campylobacter, PCR: NOT DETECTED
Cryptosporidium, PCR: NOT DETECTED
E COLI (STEC) STX1/STX2, PCR: NOT DETECTED
E COLI 0157, PCR: NOT DETECTED
E coli (ETEC) LT/ST PCR: NOT DETECTED
Giardia lamblia, PCR: NOT DETECTED
Norovirus, PCR: NOT DETECTED
Rotavirus A, PCR: NOT DETECTED
SALMONELLA, PCR: NOT DETECTED
SHIGELLA, PCR: NOT DETECTED

## 2016-04-09 NOTE — Patient Instructions (Addendum)
Please stop your believe that. I will call you with results of your recent blood work. B sure you keep your appointment with Dr. Kendall Flack. I have made you an appointment to see the cardiologist for your abnormal EKG and slow heart rate    IF you received an x-ray today, you will receive an invoice from [facility]. Please contact [facility] at [phone] with questions or concerns regarding your invoice.   IF you received labwork today, you will receive an invoice from [employer]. Please contact [facility] at [phone] with questions or concerns regarding your invoice.   Our billing staff will not be able to assist you with questions regarding bills from these companies.  You will be contacted with the lab results as soon as they are available. The fastest way to get your results is to activate your My Chart account. Instructions are located on the last page of this paperwork. If you have not heard from [place] regarding the results in 2 weeks, please contact this office.

## 2016-04-09 NOTE — Telephone Encounter (Signed)
Call the daughter. It would be good to wait until next week to repeat the urine culture and blood work. We should have her off the [place] for a period of time before we repeat all her blood work as long as she is doing well. I discussed this with the daughter and with her treating doctor Dr. Kendall Flack at [facility]

## 2016-04-09 NOTE — Progress Notes (Signed)
By signing my name below, I, Moises Blood, attest that this documentation has been prepared under the direction and in the presence of Arlyss Queen, MD. Electronically Signed: Moises Blood, [place]. 04/09/2016 , 12:10 PM .  Patient was seen in room 14 .  Chief Complaint:  Chief Complaint  Patient presents with  . Diarrhea    brought in stool sample    HPI: Melanie Trevino is a 82 y.o. female who reports to [facility] today complaining of diarrhea. She is followed by hematologist, Dr. Turner Daniels, at [facility]. He believes her symptoms are due to her GLEEVEC. She had switched to generic brand of GLEEVEC and noticed issues including tongue numbness and right facial droop.   Her nurse informs that the patient's heart rate runs low in the morning.   Past Medical History  Diagnosis Date  . Hypertension   . Cancer ([place])   . High cholesterol   . Wheelchair bound 07/08/2015  . Renal mass 07/08/2015  . Liver metastasis ([place]) 07/08/2015  . 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 Dementia of the Alzheimer's type 07/08/2015  . Chronic indwelling Foley catheter 07/08/2015  . Breast cancer ([place]) 08/18/2012   Past Surgical History  Procedure Laterality Date  . Mastectomy Right    Social History   Social History  . Marital Status: Widowed    Spouse Name: N/A  . Number of Children: N/A  . Years of Education: N/A   Social History Main Topics  . Smoking status: Never Smoker   . Smokeless tobacco: None  . Alcohol Use: None  . Drug Use: None  . Sexual Activity: Not Asked   Other Topics Concern  . None   Social History Narrative   No family history on file. Allergies  Allergen Reactions  . Shellfish Allergy Anaphylaxis  . Other Other (See Comments)    HORSE SERUMYDrug[Other] swelling6/09/2006 12:00:00 AM by Erin Hearing CPhT  . Amoxicillin Rash and Swelling   Prior to Admission medications   Medication Sig Start Date End Date Taking? Authorizing Provider  albuterol  (PROVENTIL HFA;VENTOLIN HFA) 108 (90 BASE) MCG/ACT inhaler Inhale 2 puffs into the lungs every 4 (four) hours as needed for wheezing or shortness of breath (cough, shortness of breath or wheezing.). Patient not taking: Reported on 04/05/2016 01/17/14   Roselee Culver, MD  calcium carbonate (OS-CAL) 600 MG TABS tablet Take 600 mg by mouth 2 (two) times daily with a meal.    Historical Provider, MD  ciprofloxacin (CIPRO) 250 MG tablet Take 1 tablet (250 mg total) by mouth 2 (two) times daily. 04/05/16   Darlyne Russian, MD  docusate sodium (COLACE) 100 MG capsule Take 100 mg by mouth at bedtime. Reported on 04/05/2016    Historical Provider, MD  donepezil (ARICEPT) 10 MG tablet Take 10 mg by mouth at bedtime.    Historical Provider, MD  fluticasone (FLONASE) 50 MCG/ACT nasal spray Place 1 spray into both nostrils daily.  07/27/12   Historical Provider, MD  furosemide (LASIX) 40 MG tablet Take 1 tablet (40 mg total) by mouth daily. 07/12/15   Oswald Hillock, MD  Imatinib Mesylate (GLEEVEC PO) Take by mouth.    Historical Provider, MD  losartan (COZAAR) 50 MG tablet Take 1 tablet (50 mg total) by mouth daily. 07/12/15   Oswald Hillock, MD  methocarbamol (ROBAXIN) 500 MG tablet Take 500 mg by mouth every 8 (eight) hours as needed for muscle spasms.     Historical Provider,  MD  minocycline (DYNACIN) 50 MG tablet Take 50 mg by mouth at bedtime. Reported on 04/05/2016    Historical Provider, MD  mirabegron ER (MYRBETRIQ) 50 MG TB24 tablet Take 50 mg by mouth daily.    Historical Provider, MD  potassium chloride SA (K-DUR,KLOR-CON) 20 MEQ tablet Take 2 tablets (40 mEq total) by mouth daily. 07/12/15   Oswald Hillock, MD  Spacer/Aero-Holding Dorise Bullion Use with MDI as directed 01/17/14   Roselee Culver, MD  vitamin B-12 (CYANOCOBALAMIN) 1000 MCG tablet Take 1,000 mcg by mouth 2 (two) times daily.     Historical Provider, MD     ROS:  Constitutional: negative for fever, chills, night sweats, weight changes, or fatigue    HEENT: negative for vision changes, hearing loss, congestion, rhinorrhea, ST, epistaxis, or sinus pressure Cardiovascular: negative for chest pain or palpitations Respiratory: negative for hemoptysis, wheezing, shortness of breath, or cough Abdominal: negative for abdominal pain, nausea, vomiting, or constipation; positive for diarrhea Dermatological: negative for rash Neurologic: negative for headache, dizziness, or syncope All other systems reviewed and are otherwise negative with the exception to those above and in the HPI.  PHYSICAL EXAM: Filed Vitals:   04/09/16 1128  BP: 100/63  Pulse: 60  Temp: 97.8 F (36.6 C)  Resp: 18   There is no weight on file to calculate BMI.   General: Alert cooperative, in wheelchair HEENT:  Normocephalic, atraumatic, oropharynx patent; right facial paralysis Eye: EOMI, PEERLDC Cardiovascular:  Heart rate slow, regular rhythm; no murmur, rubs or gallops.  No Carotid bruits, radial pulse intact. No pedal edema.  Respiratory: Clear to auscultation bilaterally.  No wheezes, rales, or rhonchi.  No cyanosis, no use of accessory musculature Abdominal: No organomegaly, abdomen is soft and non-tender, positive bowel sounds. No masses; has a midline scar but no tenderness Musculoskeletal: Gait intact. No edema, tenderness Skin: No rashes. Neurologic: Facial musculature symmetric. Psychiatric: Patient acts appropriately throughout our interaction.  Lymphatic: No cervical or submandibular lymphadenopathy Genitourinary/Anorectal: No acute findings  LABS: Results for orders placed or performed in visit on 04/09/16  POCT CBC  Result Value Ref Range   WBC 4.9 4.6 - 10.2 K/uL   Lymph, poc 1.2 0.6 - 3.4   POC LYMPH PERCENT 24.1 10 - 50 %L   MID (cbc) 0.2 0 - 0.9   POC MID % 4.9 0 - 12 %M   POC Granulocyte 3.5 2 - 6.9   Granulocyte percent 71.0 37 - 80 %G   RBC 3.32 (A) 4.04 - 5.48 M/uL   Hemoglobin 10.4 (A) 12.2 - 16.2 g/dL   HCT, POC 30.0 (A) 37.7 -  47.9 %   MCV 90.3 80 - 97 fL   MCH, POC 31.4 (A) 27 - 31.2 pg   MCHC 34.8 31.8 - 35.4 g/dL   RDW, POC 14.4 %   Platelet Count, POC 151 142 - 424 K/uL   MPV 7.6 0 - 99.8 fL     EKG/XRAY:     ASSESSMENT/PLAN: Heart rate was slow I did an EKG. It shows previous anterolateral MI unchanged from previous. I did send her to the cardiologist for this. We sent stool specimen for evaluation. A urine culture was repeated today. Her liver function tests have been repeated. She was advised to stay off the [place] for now. There is still a possibility he passed a gallstone with transient obstruction. I did discuss her case with Dr. Kendall Flack last night. For the time being since she has a very  slow growing tumor we'll hold the Gleevec.I personally performed the services described in this documentation, which was scribed in my presence. The recorded information has been reviewed and is accurate.  Gross sideeffects, risk and benefits, and alternatives of medications d/w patient. Patient is aware that all medications have potential sideeffects and we are unable to predict every sideeffect or drug-drug interaction that may occur.  Arlyss Queen MD 04/09/2016 12:33 PM

## 2016-04-09 NOTE — Telephone Encounter (Signed)
LMVM for them to [facility] for information from Dr. Everlene Farrier.

## 2016-04-10 DIAGNOSIS — F0280 Dementia in other diseases classified elsewhere without behavioral disturbance: Secondary | ICD-10-CM | POA: Diagnosis not present

## 2016-04-10 DIAGNOSIS — N319 Neuromuscular dysfunction of bladder, unspecified: Secondary | ICD-10-CM | POA: Diagnosis not present

## 2016-04-10 DIAGNOSIS — N2889 Other specified disorders of kidney and ureter: Secondary | ICD-10-CM | POA: Diagnosis not present

## 2016-04-10 DIAGNOSIS — I10 Essential (primary) hypertension: Secondary | ICD-10-CM | POA: Diagnosis not present

## 2016-04-10 DIAGNOSIS — G309 Alzheimer's disease, unspecified: Secondary | ICD-10-CM | POA: Diagnosis not present

## 2016-04-10 DIAGNOSIS — Z466 Encounter for fitting and adjustment of urinary device: Secondary | ICD-10-CM | POA: Diagnosis not present

## 2016-04-10 LAB — URINE CULTURE
COLONY COUNT: NO GROWTH
Organism ID, Bacteria: NO GROWTH

## 2016-04-11 ENCOUNTER — Telehealth: Payer: Self-pay | Admitting: Emergency Medicine

## 2016-04-11 DIAGNOSIS — R200 Anesthesia of skin: Secondary | ICD-10-CM | POA: Diagnosis not present

## 2016-04-11 DIAGNOSIS — G510 Bell's palsy: Secondary | ICD-10-CM | POA: Diagnosis not present

## 2016-04-11 NOTE — Telephone Encounter (Signed)
-----   Message from Darlyne Russian, MD sent at 04/10/2016  7:31 AM EDT ----- All patient. Her liver tests are better. Be sure we fax a copy of these to her doctor at [facility] Dr. Kendall Flack. Call the daughter-in-law Dionne Milo with these results and be sure these results are sent to her regular doctor at [facility]. It is still possible she may be passing small gallstones or still related to the drug Gleevec

## 2016-04-11 NOTE — Telephone Encounter (Signed)
Unable to leave VM. Daughter in law Mickel Baas number not listed as contact

## 2016-04-15 DIAGNOSIS — Z466 Encounter for fitting and adjustment of urinary device: Secondary | ICD-10-CM | POA: Diagnosis not present

## 2016-04-15 DIAGNOSIS — R7989 Other specified abnormal findings of blood chemistry: Secondary | ICD-10-CM | POA: Diagnosis not present

## 2016-04-15 DIAGNOSIS — I10 Essential (primary) hypertension: Secondary | ICD-10-CM | POA: Diagnosis not present

## 2016-04-15 DIAGNOSIS — F0280 Dementia in other diseases classified elsewhere without behavioral disturbance: Secondary | ICD-10-CM | POA: Diagnosis not present

## 2016-04-15 DIAGNOSIS — G309 Alzheimer's disease, unspecified: Secondary | ICD-10-CM | POA: Diagnosis not present

## 2016-04-15 DIAGNOSIS — N2889 Other specified disorders of kidney and ureter: Secondary | ICD-10-CM | POA: Diagnosis not present

## 2016-04-15 DIAGNOSIS — N319 Neuromuscular dysfunction of bladder, unspecified: Secondary | ICD-10-CM | POA: Diagnosis not present

## 2016-04-17 DIAGNOSIS — C229 Malignant neoplasm of liver, not specified as primary or secondary: Secondary | ICD-10-CM | POA: Diagnosis not present

## 2016-04-17 DIAGNOSIS — N319 Neuromuscular dysfunction of bladder, unspecified: Secondary | ICD-10-CM | POA: Diagnosis not present

## 2016-04-17 DIAGNOSIS — Z741 Need for assistance with personal care: Secondary | ICD-10-CM | POA: Diagnosis not present

## 2016-04-17 DIAGNOSIS — M160 Bilateral primary osteoarthritis of hip: Secondary | ICD-10-CM | POA: Diagnosis not present

## 2016-04-17 DIAGNOSIS — I10 Essential (primary) hypertension: Secondary | ICD-10-CM | POA: Diagnosis not present

## 2016-04-17 DIAGNOSIS — Z1389 Encounter for screening for other disorder: Secondary | ICD-10-CM | POA: Diagnosis not present

## 2016-04-17 DIAGNOSIS — N183 Chronic kidney disease, stage 3 (moderate): Secondary | ICD-10-CM | POA: Diagnosis not present

## 2016-04-17 DIAGNOSIS — D214 Benign neoplasm of connective and other soft tissue of abdomen: Secondary | ICD-10-CM | POA: Diagnosis not present

## 2016-04-17 DIAGNOSIS — C50919 Malignant neoplasm of unspecified site of unspecified female breast: Secondary | ICD-10-CM | POA: Diagnosis not present

## 2016-04-17 DIAGNOSIS — F0390 Unspecified dementia without behavioral disturbance: Secondary | ICD-10-CM | POA: Diagnosis not present

## 2016-04-23 DIAGNOSIS — C49A0 Gastrointestinal stromal tumor, unspecified site: Secondary | ICD-10-CM | POA: Diagnosis not present

## 2016-04-30 ENCOUNTER — Encounter: Payer: Self-pay | Admitting: Cardiovascular Disease

## 2016-04-30 ENCOUNTER — Ambulatory Visit (INDEPENDENT_AMBULATORY_CARE_PROVIDER_SITE_OTHER): Payer: Medicare Other | Admitting: Cardiovascular Disease

## 2016-04-30 VITALS — BP 141/68 | HR 48 | Ht 64.0 in

## 2016-04-30 DIAGNOSIS — R001 Bradycardia, unspecified: Secondary | ICD-10-CM | POA: Insufficient documentation

## 2016-04-30 NOTE — Progress Notes (Signed)
04/30/2016 Melanie Trevino   07-05-1928  [id number]  Primary Physician Melanie Sax, MD Primary Cardiologist: Melanie Harp MD Melanie Trevino  HPI:  [name] is an 82 year old mildly overweight widowed Caucasian female mother of 2 sons referred by Dr. Everlene Trevino at urgent care for evaluation of a symptomatically bradycardia. I have seen her in the past 6/8//11. 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 She's had a brain tumor resected 11/27/1993 at [facility] and has had breast cancer status post mastectomy. We did a 2-D echo at that time 03/25/10 which is entirely normal as was a Myoview stress test. She was recently seen because of diarrhea and had an EKG which showed bradycardia. She is otherwise asymptomatic.   Current Outpatient Prescriptions  Medication Sig Dispense Refill  . albuterol (PROVENTIL HFA;VENTOLIN HFA) 108 (90 BASE) MCG/ACT inhaler Inhale 2 puffs into the lungs every 4 (four) hours as needed for wheezing or shortness of breath (cough, shortness of breath or wheezing.). 1 Inhaler 1  . calcium carbonate (OS-CAL) 600 MG TABS tablet Take 600 mg by mouth 2 (two) times daily with a meal.    . ciprofloxacin (CIPRO) 250 MG tablet Take 1 tablet (250 mg total) by mouth 2 (two) times daily. 6 tablet 0  . docusate sodium (COLACE) 100 MG capsule Take 100 mg by mouth at bedtime. Reported on 04/09/2016    . donepezil (ARICEPT) 10 MG tablet Take 10 mg by mouth at bedtime.    . fluticasone (FLONASE) 50 MCG/ACT nasal spray Place 1 spray into both nostrils daily.     . furosemide (LASIX) 40 MG tablet Take 1 tablet (40 mg total) by mouth daily. 30 tablet 2  . Imatinib Mesylate (GLEEVEC PO) Take by mouth.    . losartan (COZAAR) 50 MG tablet Take 1 tablet (50 mg total) by mouth daily.    . methocarbamol (ROBAXIN) 500 MG tablet Take 500 mg by mouth every 8 (eight) hours as needed for muscle spasms.     . minocycline (DYNACIN) 50 MG tablet Take 50 mg by mouth at  bedtime. Reported on 04/09/2016    . mirabegron ER (MYRBETRIQ) 50 MG TB24 tablet Take 50 mg by mouth daily.    . potassium chloride SA (K-DUR,KLOR-CON) 20 MEQ tablet Take 2 tablets (40 mEq total) by mouth daily.    Marland Kitchen Spacer/Aero-Holding Dorise Bullion Use with MDI as directed 1 each 2  . vitamin B-12 (CYANOCOBALAMIN) 1000 MCG tablet Take 1,000 mcg by mouth 2 (two) times daily.      No current facility-administered medications for this visit.    Allergies  Allergen Reactions  . Shellfish Allergy Anaphylaxis  . Other Other (See Comments)    HORSE SERUMYDrug[Other] swelling6/09/2006 12:00:00 AM by Melanie Trevino CPhT  . Amoxicillin Rash and Swelling    Social History   Social History  . Marital Status: Widowed    Spouse Name: N/A  . Number of Children: N/A  . Years of Education: N/A   Occupational History  . Not on file.   Social History Main Topics  . Smoking status: Never Smoker   . Smokeless tobacco: Not on file  . Alcohol Use: No  . Drug Use: No  . Sexual Activity: Not on file   Other Topics Concern  . Not on file   Social History Narrative     Review of Systems: General: negative for chills, fever, night sweats or weight changes.  Cardiovascular: negative  for chest pain, dyspnea on exertion, edema, orthopnea, palpitations, paroxysmal nocturnal dyspnea or shortness of breath Dermatological: negative for rash Respiratory: negative for cough or wheezing Urologic: negative for hematuria Abdominal: negative for nausea, vomiting, diarrhea, bright red blood per rectum, melena, or hematemesis Neurologic: negative for visual changes, syncope, or dizziness All other systems reviewed and are otherwise negative except as noted above.    Blood pressure 141/68, pulse 48, height 5\' 4"  (1.626 m).  General appearance: alert and no distress Neck: no adenopathy, no carotid bruit, no JVD, supple, symmetrical, trachea midline and thyroid not enlarged, symmetric, no  tenderness/mass/nodules Lungs: clear to auscultation bilaterally Heart: regular rate and rhythm, S1, S2 normal, no murmur, click, rub or gallop Extremities: extremities normal, atraumatic, no cyanosis or edema  EKG not performed today  ASSESSMENT AND PLAN:   Bradycardia Melanie Trevino was referred by Dr. Everlene Trevino for evaluation of asymptomatic bradycardia. She was recently seen in his office because of diarrhea. Routine electrocardiogram revealed sinus bradycardia at a rate of 49. She is wheelchair bound and nonambulatory. She is asymptomatic from this. She is not on any rate lowering drugs. No further workup or evaluation is necessary at this time.      Melanie Harp MD FACP,FACC,FAHA, [facility] 04/30/2016 4:06 PM

## 2016-04-30 NOTE — Assessment & Plan Note (Addendum)
Melanie Trevino was referred by Dr. Everlene Farrier for evaluation of asymptomatic bradycardia. She was recently seen in his office because of diarrhea. Routine electrocardiogram revealed sinus bradycardia at a rate of 49. She is wheelchair bound and nonambulatory. She is asymptomatic from this. She is not on any rate lowering drugs. No further workup or evaluation is necessary at this time.

## 2016-04-30 NOTE — Patient Instructions (Signed)
Medication Instructions:  Your physician recommends that you continue on your current medications as directed. Please refer to the Current Medication list given to you today.   Follow-Up: Your physician recommends that you schedule a follow-up appointment ON AN AS NEEDED BASIS.   Any Other Special Instructions Will Be Listed Below (If Applicable).     If you need a refill on your cardiac medications before your next appointment, please call your pharmacy.

## 2016-05-01 DIAGNOSIS — N319 Neuromuscular dysfunction of bladder, unspecified: Secondary | ICD-10-CM | POA: Diagnosis not present

## 2016-05-01 DIAGNOSIS — N2889 Other specified disorders of kidney and ureter: Secondary | ICD-10-CM | POA: Diagnosis not present

## 2016-05-01 DIAGNOSIS — Z466 Encounter for fitting and adjustment of urinary device: Secondary | ICD-10-CM | POA: Diagnosis not present

## 2016-05-01 DIAGNOSIS — F0280 Dementia in other diseases classified elsewhere without behavioral disturbance: Secondary | ICD-10-CM | POA: Diagnosis not present

## 2016-05-01 DIAGNOSIS — I10 Essential (primary) hypertension: Secondary | ICD-10-CM | POA: Diagnosis not present

## 2016-05-01 DIAGNOSIS — G309 Alzheimer's disease, unspecified: Secondary | ICD-10-CM | POA: Diagnosis not present

## 2016-05-12 DIAGNOSIS — Z466 Encounter for fitting and adjustment of urinary device: Secondary | ICD-10-CM | POA: Diagnosis not present

## 2016-05-12 DIAGNOSIS — I10 Essential (primary) hypertension: Secondary | ICD-10-CM | POA: Diagnosis not present

## 2016-05-12 DIAGNOSIS — N319 Neuromuscular dysfunction of bladder, unspecified: Secondary | ICD-10-CM | POA: Diagnosis not present

## 2016-05-12 DIAGNOSIS — N2889 Other specified disorders of kidney and ureter: Secondary | ICD-10-CM | POA: Diagnosis not present

## 2016-05-12 DIAGNOSIS — G309 Alzheimer's disease, unspecified: Secondary | ICD-10-CM | POA: Diagnosis not present

## 2016-05-12 DIAGNOSIS — F0280 Dementia in other diseases classified elsewhere without behavioral disturbance: Secondary | ICD-10-CM | POA: Diagnosis not present

## 2016-05-13 DIAGNOSIS — R740 Nonspecific elevation of levels of transaminase and lactic acid dehydrogenase [LDH]: Secondary | ICD-10-CM | POA: Diagnosis not present

## 2016-05-29 DIAGNOSIS — N2889 Other specified disorders of kidney and ureter: Secondary | ICD-10-CM | POA: Diagnosis not present

## 2016-05-29 DIAGNOSIS — F0280 Dementia in other diseases classified elsewhere without behavioral disturbance: Secondary | ICD-10-CM | POA: Diagnosis not present

## 2016-05-29 DIAGNOSIS — Z466 Encounter for fitting and adjustment of urinary device: Secondary | ICD-10-CM | POA: Diagnosis not present

## 2016-05-29 DIAGNOSIS — N319 Neuromuscular dysfunction of bladder, unspecified: Secondary | ICD-10-CM | POA: Diagnosis not present

## 2016-05-29 DIAGNOSIS — I10 Essential (primary) hypertension: Secondary | ICD-10-CM | POA: Diagnosis not present

## 2016-05-29 DIAGNOSIS — G309 Alzheimer's disease, unspecified: Secondary | ICD-10-CM | POA: Diagnosis not present

## 2016-06-03 DIAGNOSIS — C49A3 Gastrointestinal stromal tumor of small intestine: Secondary | ICD-10-CM | POA: Diagnosis not present

## 2016-06-03 DIAGNOSIS — Z08 Encounter for follow-up examination after completed treatment for malignant neoplasm: Secondary | ICD-10-CM | POA: Diagnosis not present

## 2016-06-03 DIAGNOSIS — C50911 Malignant neoplasm of unspecified site of right female breast: Secondary | ICD-10-CM | POA: Diagnosis not present

## 2016-06-03 DIAGNOSIS — K869 Disease of pancreas, unspecified: Secondary | ICD-10-CM | POA: Diagnosis not present

## 2016-06-03 DIAGNOSIS — C642 Malignant neoplasm of left kidney, except renal pelvis: Secondary | ICD-10-CM | POA: Diagnosis not present

## 2016-06-03 DIAGNOSIS — Z5111 Encounter for antineoplastic chemotherapy: Secondary | ICD-10-CM | POA: Diagnosis not present

## 2016-06-03 DIAGNOSIS — Z9889 Other specified postprocedural states: Secondary | ICD-10-CM | POA: Diagnosis not present

## 2016-06-03 DIAGNOSIS — N2889 Other specified disorders of kidney and ureter: Secondary | ICD-10-CM | POA: Diagnosis not present

## 2016-06-03 DIAGNOSIS — Z85038 Personal history of other malignant neoplasm of large intestine: Secondary | ICD-10-CM | POA: Diagnosis not present

## 2016-06-03 DIAGNOSIS — F0390 Unspecified dementia without behavioral disturbance: Secondary | ICD-10-CM | POA: Diagnosis not present

## 2016-06-04 DIAGNOSIS — I10 Essential (primary) hypertension: Secondary | ICD-10-CM | POA: Diagnosis not present

## 2016-06-04 DIAGNOSIS — N2889 Other specified disorders of kidney and ureter: Secondary | ICD-10-CM | POA: Diagnosis not present

## 2016-06-04 DIAGNOSIS — G309 Alzheimer's disease, unspecified: Secondary | ICD-10-CM | POA: Diagnosis not present

## 2016-06-04 DIAGNOSIS — N319 Neuromuscular dysfunction of bladder, unspecified: Secondary | ICD-10-CM | POA: Diagnosis not present

## 2016-06-04 DIAGNOSIS — Z466 Encounter for fitting and adjustment of urinary device: Secondary | ICD-10-CM | POA: Diagnosis not present

## 2016-06-04 DIAGNOSIS — F0280 Dementia in other diseases classified elsewhere without behavioral disturbance: Secondary | ICD-10-CM | POA: Diagnosis not present

## 2016-06-05 DIAGNOSIS — F0280 Dementia in other diseases classified elsewhere without behavioral disturbance: Secondary | ICD-10-CM | POA: Diagnosis not present

## 2016-06-05 DIAGNOSIS — N319 Neuromuscular dysfunction of bladder, unspecified: Secondary | ICD-10-CM | POA: Diagnosis not present

## 2016-06-05 DIAGNOSIS — G309 Alzheimer's disease, unspecified: Secondary | ICD-10-CM | POA: Diagnosis not present

## 2016-06-05 DIAGNOSIS — N2889 Other specified disorders of kidney and ureter: Secondary | ICD-10-CM | POA: Diagnosis not present

## 2016-06-05 DIAGNOSIS — Z466 Encounter for fitting and adjustment of urinary device: Secondary | ICD-10-CM | POA: Diagnosis not present

## 2016-06-05 DIAGNOSIS — I10 Essential (primary) hypertension: Secondary | ICD-10-CM | POA: Diagnosis not present

## 2016-06-06 DIAGNOSIS — R29810 Facial weakness: Secondary | ICD-10-CM | POA: Diagnosis not present

## 2016-06-06 DIAGNOSIS — G510 Bell's palsy: Secondary | ICD-10-CM | POA: Diagnosis not present

## 2016-06-06 DIAGNOSIS — F0280 Dementia in other diseases classified elsewhere without behavioral disturbance: Secondary | ICD-10-CM | POA: Diagnosis not present

## 2016-06-06 DIAGNOSIS — Z85828 Personal history of other malignant neoplasm of skin: Secondary | ICD-10-CM | POA: Diagnosis not present

## 2016-06-06 DIAGNOSIS — G309 Alzheimer's disease, unspecified: Secondary | ICD-10-CM | POA: Diagnosis not present

## 2016-06-06 DIAGNOSIS — G629 Polyneuropathy, unspecified: Secondary | ICD-10-CM | POA: Diagnosis not present

## 2016-06-06 DIAGNOSIS — D333 Benign neoplasm of cranial nerves: Secondary | ICD-10-CM | POA: Diagnosis not present

## 2016-06-06 DIAGNOSIS — R200 Anesthesia of skin: Secondary | ICD-10-CM | POA: Diagnosis not present

## 2016-06-09 DIAGNOSIS — G309 Alzheimer's disease, unspecified: Secondary | ICD-10-CM | POA: Diagnosis not present

## 2016-06-09 DIAGNOSIS — I10 Essential (primary) hypertension: Secondary | ICD-10-CM | POA: Diagnosis not present

## 2016-06-09 DIAGNOSIS — Z466 Encounter for fitting and adjustment of urinary device: Secondary | ICD-10-CM | POA: Diagnosis not present

## 2016-06-09 DIAGNOSIS — N319 Neuromuscular dysfunction of bladder, unspecified: Secondary | ICD-10-CM | POA: Diagnosis not present

## 2016-06-09 DIAGNOSIS — F0280 Dementia in other diseases classified elsewhere without behavioral disturbance: Secondary | ICD-10-CM | POA: Diagnosis not present

## 2016-06-09 DIAGNOSIS — Z87440 Personal history of urinary (tract) infections: Secondary | ICD-10-CM | POA: Diagnosis not present

## 2016-06-19 DIAGNOSIS — Z87440 Personal history of urinary (tract) infections: Secondary | ICD-10-CM | POA: Diagnosis not present

## 2016-06-19 DIAGNOSIS — N319 Neuromuscular dysfunction of bladder, unspecified: Secondary | ICD-10-CM | POA: Diagnosis not present

## 2016-06-19 DIAGNOSIS — Z466 Encounter for fitting and adjustment of urinary device: Secondary | ICD-10-CM | POA: Diagnosis not present

## 2016-06-19 DIAGNOSIS — I10 Essential (primary) hypertension: Secondary | ICD-10-CM | POA: Diagnosis not present

## 2016-06-19 DIAGNOSIS — G309 Alzheimer's disease, unspecified: Secondary | ICD-10-CM | POA: Diagnosis not present

## 2016-06-19 DIAGNOSIS — F0280 Dementia in other diseases classified elsewhere without behavioral disturbance: Secondary | ICD-10-CM | POA: Diagnosis not present

## 2016-07-04 DIAGNOSIS — G510 Bell's palsy: Secondary | ICD-10-CM | POA: Diagnosis not present

## 2016-07-10 DIAGNOSIS — Z87440 Personal history of urinary (tract) infections: Secondary | ICD-10-CM | POA: Diagnosis not present

## 2016-07-10 DIAGNOSIS — N319 Neuromuscular dysfunction of bladder, unspecified: Secondary | ICD-10-CM | POA: Diagnosis not present

## 2016-07-10 DIAGNOSIS — I10 Essential (primary) hypertension: Secondary | ICD-10-CM | POA: Diagnosis not present

## 2016-07-10 DIAGNOSIS — Z466 Encounter for fitting and adjustment of urinary device: Secondary | ICD-10-CM | POA: Diagnosis not present

## 2016-07-10 DIAGNOSIS — G309 Alzheimer's disease, unspecified: Secondary | ICD-10-CM | POA: Diagnosis not present

## 2016-07-10 DIAGNOSIS — F0280 Dementia in other diseases classified elsewhere without behavioral disturbance: Secondary | ICD-10-CM | POA: Diagnosis not present

## 2016-07-18 DIAGNOSIS — Z0000 Encounter for general adult medical examination without abnormal findings: Secondary | ICD-10-CM | POA: Diagnosis not present

## 2016-07-18 DIAGNOSIS — R7309 Other abnormal glucose: Secondary | ICD-10-CM | POA: Diagnosis not present

## 2016-07-18 DIAGNOSIS — N319 Neuromuscular dysfunction of bladder, unspecified: Secondary | ICD-10-CM | POA: Diagnosis not present

## 2016-07-18 DIAGNOSIS — Z1389 Encounter for screening for other disorder: Secondary | ICD-10-CM | POA: Diagnosis not present

## 2016-07-18 DIAGNOSIS — K8050 Calculus of bile duct without cholangitis or cholecystitis without obstruction: Secondary | ICD-10-CM | POA: Diagnosis not present

## 2016-07-18 DIAGNOSIS — F0390 Unspecified dementia without behavioral disturbance: Secondary | ICD-10-CM | POA: Diagnosis not present

## 2016-07-18 DIAGNOSIS — R945 Abnormal results of liver function studies: Secondary | ICD-10-CM | POA: Diagnosis not present

## 2016-07-18 DIAGNOSIS — Z23 Encounter for immunization: Secondary | ICD-10-CM | POA: Diagnosis not present

## 2016-07-18 DIAGNOSIS — C50919 Malignant neoplasm of unspecified site of unspecified female breast: Secondary | ICD-10-CM | POA: Diagnosis not present

## 2016-07-18 DIAGNOSIS — I10 Essential (primary) hypertension: Secondary | ICD-10-CM | POA: Diagnosis not present

## 2016-07-18 DIAGNOSIS — E7800 Pure hypercholesterolemia, unspecified: Secondary | ICD-10-CM | POA: Diagnosis not present

## 2016-07-18 DIAGNOSIS — D214 Benign neoplasm of connective and other soft tissue of abdomen: Secondary | ICD-10-CM | POA: Diagnosis not present

## 2016-07-18 DIAGNOSIS — D333 Benign neoplasm of cranial nerves: Secondary | ICD-10-CM | POA: Diagnosis not present

## 2016-07-18 DIAGNOSIS — N183 Chronic kidney disease, stage 3 (moderate): Secondary | ICD-10-CM | POA: Diagnosis not present

## 2016-07-22 ENCOUNTER — Other Ambulatory Visit: Payer: Self-pay | Admitting: Internal Medicine

## 2016-07-22 DIAGNOSIS — K8050 Calculus of bile duct without cholangitis or cholecystitis without obstruction: Secondary | ICD-10-CM

## 2016-07-26 DIAGNOSIS — G309 Alzheimer's disease, unspecified: Secondary | ICD-10-CM | POA: Diagnosis not present

## 2016-07-26 DIAGNOSIS — Z87440 Personal history of urinary (tract) infections: Secondary | ICD-10-CM | POA: Diagnosis not present

## 2016-07-26 DIAGNOSIS — F0280 Dementia in other diseases classified elsewhere without behavioral disturbance: Secondary | ICD-10-CM | POA: Diagnosis not present

## 2016-07-26 DIAGNOSIS — I10 Essential (primary) hypertension: Secondary | ICD-10-CM | POA: Diagnosis not present

## 2016-07-26 DIAGNOSIS — Z466 Encounter for fitting and adjustment of urinary device: Secondary | ICD-10-CM | POA: Diagnosis not present

## 2016-07-26 DIAGNOSIS — N319 Neuromuscular dysfunction of bladder, unspecified: Secondary | ICD-10-CM | POA: Diagnosis not present

## 2016-07-27 ENCOUNTER — Emergency Department (HOSPITAL_COMMUNITY): Payer: Medicare Other

## 2016-07-27 ENCOUNTER — Encounter (HOSPITAL_COMMUNITY): Payer: Self-pay | Admitting: Emergency Medicine

## 2016-07-27 ENCOUNTER — Inpatient Hospital Stay (HOSPITAL_COMMUNITY)
Admission: EM | Admit: 2016-07-27 | Discharge: 2016-07-31 | DRG: 871 | Disposition: A | Discharge status: Home / Self Care | Payer: Medicare Other | Attending: Internal Medicine | Admitting: Internal Medicine

## 2016-07-27 DIAGNOSIS — C787 Secondary malignant neoplasm of liver and intrahepatic bile duct: Secondary | ICD-10-CM | POA: Diagnosis not present

## 2016-07-27 DIAGNOSIS — Z7401 Bed confinement status: Secondary | ICD-10-CM

## 2016-07-27 DIAGNOSIS — E871 Hypo-osmolality and hyponatremia: Secondary | ICD-10-CM | POA: Diagnosis present

## 2016-07-27 DIAGNOSIS — D6959 Other secondary thrombocytopenia: Secondary | ICD-10-CM | POA: Diagnosis present

## 2016-07-27 DIAGNOSIS — B961 Klebsiella pneumoniae [K. pneumoniae] as the cause of diseases classified elsewhere: Secondary | ICD-10-CM | POA: Diagnosis present

## 2016-07-27 DIAGNOSIS — R001 Bradycardia, unspecified: Secondary | ICD-10-CM | POA: Diagnosis present

## 2016-07-27 DIAGNOSIS — N390 Urinary tract infection, site not specified: Secondary | ICD-10-CM | POA: Diagnosis present

## 2016-07-27 DIAGNOSIS — D696 Thrombocytopenia, unspecified: Secondary | ICD-10-CM | POA: Diagnosis present

## 2016-07-27 DIAGNOSIS — Z993 Dependence on wheelchair: Secondary | ICD-10-CM

## 2016-07-27 DIAGNOSIS — I248 Other forms of acute ischemic heart disease: Secondary | ICD-10-CM | POA: Diagnosis present

## 2016-07-27 DIAGNOSIS — N183 Chronic kidney disease, stage 3 unspecified: Secondary | ICD-10-CM

## 2016-07-27 DIAGNOSIS — N179 Acute kidney failure, unspecified: Secondary | ICD-10-CM

## 2016-07-27 DIAGNOSIS — N184 Chronic kidney disease, stage 4 (severe): Secondary | ICD-10-CM | POA: Diagnosis present

## 2016-07-27 DIAGNOSIS — Z853 Personal history of malignant neoplasm of breast: Secondary | ICD-10-CM

## 2016-07-27 DIAGNOSIS — I4891 Unspecified atrial fibrillation: Secondary | ICD-10-CM

## 2016-07-27 DIAGNOSIS — Z9010 Acquired absence of unspecified breast and nipple: Secondary | ICD-10-CM

## 2016-07-27 DIAGNOSIS — G309 Alzheimer's disease, unspecified: Secondary | ICD-10-CM | POA: Diagnosis present

## 2016-07-27 DIAGNOSIS — F0280 Dementia in other diseases classified elsewhere without behavioral disturbance: Secondary | ICD-10-CM | POA: Diagnosis present

## 2016-07-27 DIAGNOSIS — R6521 Severe sepsis with septic shock: Secondary | ICD-10-CM | POA: Diagnosis not present

## 2016-07-27 DIAGNOSIS — D649 Anemia, unspecified: Secondary | ICD-10-CM | POA: Diagnosis present

## 2016-07-27 DIAGNOSIS — H9190 Unspecified hearing loss, unspecified ear: Secondary | ICD-10-CM | POA: Diagnosis present

## 2016-07-27 DIAGNOSIS — E7800 Pure hypercholesterolemia, unspecified: Secondary | ICD-10-CM | POA: Diagnosis present

## 2016-07-27 DIAGNOSIS — K8020 Calculus of gallbladder without cholecystitis without obstruction: Secondary | ICD-10-CM

## 2016-07-27 DIAGNOSIS — Z880 Allergy status to penicillin: Secondary | ICD-10-CM

## 2016-07-27 DIAGNOSIS — I129 Hypertensive chronic kidney disease with stage 1 through stage 4 chronic kidney disease, or unspecified chronic kidney disease: Secondary | ICD-10-CM | POA: Diagnosis present

## 2016-07-27 DIAGNOSIS — N2889 Other specified disorders of kidney and ureter: Secondary | ICD-10-CM | POA: Diagnosis present

## 2016-07-27 DIAGNOSIS — Z7951 Long term (current) use of inhaled steroids: Secondary | ICD-10-CM

## 2016-07-27 DIAGNOSIS — A419 Sepsis, unspecified organism: Secondary | ICD-10-CM | POA: Diagnosis not present

## 2016-07-27 DIAGNOSIS — Z87440 Personal history of urinary (tract) infections: Secondary | ICD-10-CM

## 2016-07-27 DIAGNOSIS — Z79899 Other long term (current) drug therapy: Secondary | ICD-10-CM

## 2016-07-27 DIAGNOSIS — Z888 Allergy status to other drugs, medicaments and biological substances status: Secondary | ICD-10-CM

## 2016-07-27 DIAGNOSIS — Z8249 Family history of ischemic heart disease and other diseases of the circulatory system: Secondary | ICD-10-CM

## 2016-07-27 DIAGNOSIS — E872 Acidosis: Secondary | ICD-10-CM | POA: Diagnosis not present

## 2016-07-27 DIAGNOSIS — G92 Toxic encephalopathy: Secondary | ICD-10-CM | POA: Diagnosis present

## 2016-07-27 DIAGNOSIS — C49A3 Gastrointestinal stromal tumor of small intestine: Secondary | ICD-10-CM | POA: Diagnosis present

## 2016-07-27 DIAGNOSIS — R402421 Glasgow coma scale score 9-12, in the field [EMT or ambulance]: Secondary | ICD-10-CM | POA: Diagnosis not present

## 2016-07-27 DIAGNOSIS — K7200 Acute and subacute hepatic failure without coma: Secondary | ICD-10-CM | POA: Diagnosis not present

## 2016-07-27 DIAGNOSIS — Z91013 Allergy to seafood: Secondary | ICD-10-CM

## 2016-07-27 DIAGNOSIS — R4182 Altered mental status, unspecified: Secondary | ICD-10-CM | POA: Diagnosis not present

## 2016-07-27 LAB — I-STAT CG4 LACTIC ACID, ED: LACTIC ACID, VENOUS: 4.54 mmol/L — AB (ref 0.5–1.9)

## 2016-07-27 MED ORDER — VANCOMYCIN HCL IN DEXTROSE 1-5 GM/200ML-% IV SOLN: 1000.0000 mg | Freq: Once | INTRAVENOUS | Status: DC

## 2016-07-27 MED ORDER — SODIUM CHLORIDE 0.9 % IV BOLUS (SEPSIS)
1000.0000 mL | Freq: Once | INTRAVENOUS | Status: AC
  Administered 2016-07-27: 1000 mL via INTRAVENOUS

## 2016-07-27 MED ORDER — DEXTROSE 5 % IV SOLN
2.0000 g | Freq: Once | INTRAVENOUS | Status: AC
  Administered 2016-07-28: 2 g via INTRAVENOUS
  Filled 2016-07-27: qty 2

## 2016-07-27 MED ORDER — LEVOFLOXACIN IN D5W 750 MG/150ML IV SOLN
750.0000 mg | Freq: Once | INTRAVENOUS | Status: AC
  Administered 2016-07-28: 750 mg via INTRAVENOUS
  Filled 2016-07-27: qty 150

## 2016-07-27 MED ORDER — SODIUM CHLORIDE 0.9 % IV BOLUS (SEPSIS)
1000.0000 mL | Freq: Once | INTRAVENOUS | Status: AC
  Administered 2016-07-28: 1000 mL via INTRAVENOUS

## 2016-07-27 MED ORDER — SODIUM CHLORIDE 0.9 % IV BOLUS (SEPSIS)
500.0000 mL | Freq: Once | INTRAVENOUS | Status: AC
  Administered 2016-07-28: 500 mL via INTRAVENOUS

## 2016-07-27 NOTE — ED Provider Notes (Signed)
[facility] DEPT Provider Note   CSN: [id number] Arrival date & time: 07/27/16  2254   By signing my name below, I, Melanie Trevino, attest that this documentation has been prepared under the direction and in the presence of [name], MD. Electronically Signed: Sonum Trevino, Education administrator. 07/27/16. 11:30 PM.  History   Chief Complaint Chief Complaint  Patient presents with  . Blood Infection    The history is provided by a relative. The history is limited by the condition of the patient. No language interpreter was used.     LEVEL 5 CAVEAT: ALTERED MENTAL STATUS HPI Comments: Melanie Trevino is a 82 y.o. female brought in by ambulance, who presents to the Emergency Department with altered mental status for the past 2 days. Per family, patient has home health nurse who reported a few days of a fever (TMAX 100.4), vomiting, lower blood pressure, and decreased urine output. Patient has an indwelling foley which she has had for the past 5-6 years; nurse reports the catheter came out with balloon inflated upon rolling patient. Family states patient's last hospitalization was > 6 months ago. Patient is non-ambulatory. Patient does not take narcotic pain medication.   PCP Hussein   Past Medical History:  Diagnosis Date  . Breast cancer ([place]) 08/18/2012  . Cancer ([place])   . Chronic indwelling Foley catheter 07/08/2015  . Dementia of the Alzheimer's type 07/08/2015  . 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 High cholesterol   . Hypertension   . Liver metastasis ([place]) 07/08/2015  . Renal mass 07/08/2015  . Wheelchair bound 07/08/2015    Patient Active Problem List   Diagnosis Date Noted  . Bradycardia 04/30/2016  . Bacteremia 07/09/2015  . Dementia of the Alzheimer's type 07/08/2015  . Wheelchair bound 07/08/2015  . 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 Liver metastasis ([place]) 07/08/2015  . Renal mass 07/08/2015  . Chronic indwelling Foley catheter 07/08/2015  .  Transaminitis 07/08/2015  . AKI (acute kidney injury) ([place]) 07/08/2015  . Cholelithiasis 07/08/2015  . Complicated UTI (urinary tract infection) 07/08/2015  . Anemia in neoplastic disease 07/08/2015  . Thrombocytopenia ([place]) 07/08/2015  . Coagulopathy ([place]) 07/08/2015  . Facial muscle weakness 07/08/2015  . Hypocalcemia 07/08/2015  . Muscle twitching 07/08/2015  . Arterial hypotension   . Sepsis ([place]) 07/07/2015  . Venous stasis ulcer ([place]) 08/18/2012  . Liver cancer ([place]) 08/18/2012  . Breast cancer ([place]) 08/18/2012    Past Surgical History:  Procedure Laterality Date  . MASTECTOMY Right     OB History    No data available       Home Medications    Prior to Admission medications   Medication Sig Start Date End Date Taking? Authorizing Provider  albuterol (PROVENTIL HFA;VENTOLIN HFA) 108 (90 BASE) MCG/ACT inhaler Inhale 2 puffs into the lungs every 4 (four) hours as needed for wheezing or shortness of breath (cough, shortness of breath or wheezing.). 01/17/14   Roselee Culver, MD  calcium carbonate (OS-CAL) 600 MG TABS tablet Take 600 mg by mouth 2 (two) times daily with a meal.    Historical Provider, MD  ciprofloxacin (CIPRO) 250 MG tablet Take 1 tablet (250 mg total) by mouth 2 (two) times daily. 04/05/16   Darlyne Russian, MD  docusate sodium (COLACE) 100 MG capsule Take 100 mg by mouth at bedtime. Reported on 04/09/2016    Historical Provider, MD  donepezil (ARICEPT) 10 MG tablet Take 10 mg by mouth at bedtime.    Historical  Provider, MD  fluticasone (FLONASE) 50 MCG/ACT nasal spray Place 1 spray into both nostrils daily.  07/27/12   Historical Provider, MD  furosemide (LASIX) 40 MG tablet Take 1 tablet (40 mg total) by mouth daily. 07/12/15   Oswald Hillock, MD  Imatinib Mesylate (GLEEVEC PO) Take by mouth.    Historical Provider, MD  losartan (COZAAR) 50 MG tablet Take 1 tablet (50 mg total) by mouth daily. 07/12/15   Oswald Hillock, MD  methocarbamol (ROBAXIN) 500 MG tablet Take  500 mg by mouth every 8 (eight) hours as needed for muscle spasms.     Historical Provider, MD  minocycline (DYNACIN) 50 MG tablet Take 50 mg by mouth at bedtime. Reported on 04/09/2016    Historical Provider, MD  mirabegron ER (MYRBETRIQ) 50 MG TB24 tablet Take 50 mg by mouth daily.    Historical Provider, MD  potassium chloride SA (K-DUR,KLOR-CON) 20 MEQ tablet Take 2 tablets (40 mEq total) by mouth daily. 07/12/15   Oswald Hillock, MD  Spacer/Aero-Holding Dorise Bullion Use with MDI as directed 01/17/14   Roselee Culver, MD  vitamin B-12 (CYANOCOBALAMIN) 1000 MCG tablet Take 1,000 mcg by mouth 2 (two) times daily.     Historical Provider, MD    Family History No family history on file.  Social History Social History  Substance Use Topics  . Smoking status: Never Smoker  . Smokeless tobacco: Never Used  . Alcohol use No     Allergies   Shellfish allergy; Other; and Amoxicillin   Review of Systems Review of Systems  Unable to perform ROS: Mental status change     Physical Exam Updated Vital Signs Temp 101.9 F (38.8 C) (Rectal)   SpO2 96%   Physical Exam  CONSTITUTIONAL: elderly, confused HEAD: Normocephalic/atraumatic EYES: EOMI/PERRL, pupils pinpoint, no icterus  ENMT: Mucous membranes dry NECK: supple no meningeal signs SPINE/BACK:entire spine nontender CV: S1/S2 noted, no murmurs/rubs/gallops noted LUNGS: Lungs are clear to auscultation bilaterally, no apparent distress ABDOMEN: soft, nontender, no rebound or guarding, bowel sounds noted throughout abdomen, reducible abdominal wall hernia, no focal tenderness or rigidity NEURO: Pt is awake but confused, speaking incoherently, moves all extremities  EXTREMITIES: pulses normal/equal, full ROM SKIN: warm, color normal, no wounds noted to back, buttocks, or feet  PSYCH: unable to access    ED Treatments / Results  DIAGNOSTIC STUDIES: Oxygen Saturation is 96% on RA, adequate by my interpretation.    COORDINATION OF  CARE: 11:33 PM Discussed treatment plan with family at bedside and she agreed to plan.   Labs (all labs ordered are listed, but only abnormal results are displayed) Labs Reviewed  COMPREHENSIVE METABOLIC PANEL - Abnormal; Notable for the following:       Result Value   Sodium 130 (*)    Chloride 96 (*)    BUN 29 (*)    Creatinine, Ser 1.68 (*)    Calcium 7.8 (*)    Total Protein 5.7 (*)    Albumin 3.2 (*)    AST 194 (*)    ALT 184 (*)    Alkaline Phosphatase 208 (*)    Total Bilirubin 3.8 (*)    GFR calc non Af Amer 27 (*)    GFR calc Af Amer 31 (*)    All other components within normal limits  CBC WITH DIFFERENTIAL/PLATELET - Abnormal; Notable for the following:    RBC 3.55 (*)    Hemoglobin 10.8 (*)    HCT 31.5 (*)  Platelets 69 (*)    Neutro Abs 9.8 (*)    Lymphs Abs 0.2 (*)    All other components within normal limits  LIPASE, BLOOD - Abnormal; Notable for the following:    Lipase 72 (*)    All other components within normal limits  I-STAT CG4 LACTIC ACID, ED - Abnormal; Notable for the following:    Lactic Acid, Venous 4.54 (*)    All other components within normal limits  CULTURE, BLOOD (ROUTINE X 2)  CULTURE, BLOOD (ROUTINE X 2)  URINE CULTURE  URINALYSIS, ROUTINE W REFLEX MICROSCOPIC (NOT AT [facility])    EKG  EKG Interpretation  Date/Time:  Sunday July 27 2016 23:54:59 EDT Ventricular Rate:  85 PR Interval:    QRS Duration: 101 QT Interval:  358 QTC Calculation: 426 R Axis:   -56 Text Interpretation:  Sinus rhythm LAD, consider left anterior fascicular block Anterolateral infarct, age indeterminate Confirmed by Christy Gentles  MD, Marissia Blackham ([id number]) on 07/27/2016 11:58:01 PM       Radiology Dg Chest Port 1 View  Result Date: 07/28/2016 CLINICAL DATA:  Acute onset of altered mental status. Initial encounter. EXAM: PORTABLE CHEST 1 VIEW COMPARISON:  Chest radiograph performed 07/07/2015 FINDINGS: The lungs are well-aerated. There is elevation of the right  hemidiaphragm. There is no evidence of focal opacification, pleural effusion or pneumothorax. The cardiomediastinal silhouette is enlarged. No acute osseous abnormalities are seen. Degenerative change is noted at both glenohumeral joints, worse on the left. IMPRESSION: Elevation of the right hemidiaphragm. Lungs remain grossly clear. Cardiomegaly. Electronically Signed   By: Garald Balding M.D.   On: 07/28/2016 01:03    Procedures Procedures (including critical care time) CRITICAL CARE Performed by: Sharyon Cable Total critical care time: 45 minutes Critical care time was exclusive of separately billable procedures and treating other patients. Critical care was necessary to treat or prevent imminent or life-threatening deterioration. Critical care was time spent personally by me on the following activities: development of treatment plan with patient and/or surrogate as well as nursing, discussions with consultants, evaluation of patient's response to treatment, examination of patient, obtaining history from patient or surrogate, ordering and performing treatments and interventions, ordering and review of laboratory studies, ordering and review of radiographic studies, pulse oximetry and re-evaluation of patient's condition.  Medications Ordered in ED Medications  sodium chloride 0.9 % bolus 1,000 mL (0 mLs Intravenous Stopped 07/28/16 0110)    And  sodium chloride 0.9 % bolus 1,000 mL (0 mLs Intravenous Stopped 07/28/16 0115)    And  sodium chloride 0.9 % bolus 1,000 mL (1,000 mLs Intravenous New Bag/Given 07/28/16 0116)    And  sodium chloride 0.9 % bolus 500 mL (500 mLs Intravenous New Bag/Given 07/28/16 0301)  levofloxacin (LEVAQUIN) IVPB 750 mg (not administered)  vancomycin (VANCOCIN) 2,000 mg in sodium chloride 0.9 % 500 mL IVPB (2,000 mg Intravenous New Bag/Given 07/28/16 0111)  aztreonam (AZACTAM) 2 g in dextrose 5 % 50 mL IVPB (not administered)  levofloxacin (LEVAQUIN) IVPB 750 mg  (not administered)  vancomycin (VANCOCIN) 1,500 mg in sodium chloride 0.9 % 500 mL IVPB (not administered)  norepinephrine (LEVOPHED) 4 mg in dextrose 5 % 250 mL (0.016 mg/mL) infusion (not administered)  aztreonam (AZACTAM) 2 g in dextrose 5 % 50 mL IVPB (0 g Intravenous Stopped 07/28/16 0110)     Initial Impression / Assessment and Plan / ED Course  I have reviewed the triage vital signs and the nursing notes.  Pertinent labs & imaging results that  were available during my care of the patient were reviewed by me and considered in my medical decision making (see chart for details).  Clinical Course    Pt with worsening AMS for several days She is febrile/confused She has chronic indwelling foley (pt already removed this on her own) Code sepsis called   12:37 AM Sepsis - Repeat Assessment  Performed at:    12:37 AM  Vitals     Temperature 101.9 F (38.8 C), temperature source Rectal, height 5\' 4"  (1.626 m), weight 113.4 kg, SpO2 96 %.  Heart:     Tachycardic  Lungs:    CTA  Capillary Refill:   <2 sec  Peripheral Pulse:   Radial pulse palpable  Skin:     Normal Color  12:38 AM Unclear cause of sepsis, though pt has known h/o cholelithiasis per previous CT imaging.  However abd soft/no rigidity but difficult exam due to AMS Will consult critical care Also - difficult obtaining blood pressure, though all readings are extremely high 12:45 AM D/w critical care (dr Ronnette Juniper) due to elevated lactate We reviewed case Will give IV fluids and recheck lactate and call back if necessary 1:13 AM Blood pressure measurements are varied/unreliable, at times hypertensive, other times hypotensive.  Pt is awake/alert, no significant tachycardia She has 2 IV's with IV fluid running Will continue to monitor 1:55 AM More accurate readings from BP at this time SBP 70 Pt awake/alert, receiving 3rd liter NS She will soon complete her IV fluids boluses, if still hypotensive will need  central line  I was informed by nursing they were unable to send urine sample as urine was "too thick" and concentrated to be sent 3:04 AM Pt persistently hypotensive and lactate did not clear D/w daughter in law, closest family available She reports patient is full code and patient would request aggressive measures I consented her for central line Critical care is now at bedside Also - SBP>90 at this time prior to levophed administration   Final Clinical Impressions(s) / ED Diagnoses   Final diagnoses:  Septic shock ([place])  AKI (acute kidney injury) ([place])  Thrombocytopenia ([place])    New Prescriptions New Prescriptions   No medications on file   I personally performed the services described in this documentation, which was scribed in my presence. The recorded information has been reviewed and is accurate.        [name], MD 07/28/16 [phone]

## 2016-07-27 NOTE — ED Notes (Signed)
Bed: [id number] Expected date:  Expected time:  Means of arrival:  Comments: EMS 82 yo female possible code sepsis/altered mental status since Friday/fever x 3 days-monitor atrial fib

## 2016-07-27 NOTE — ED Notes (Signed)
While turning pt to obtain a rectal temperature pt was noted to have had a large BM.  Pt continued to have BM while on her side.  Pt's Foley spontaneously came out w/ balloon intact.  Per pt's family this has happened multiple times in the past.

## 2016-07-27 NOTE — ED Triage Notes (Signed)
Per EMS pt's home health RN came out and advised pt be sent to ED d/t AMS, fever and decreased urine output since Friday.

## 2016-07-27 NOTE — ED Notes (Signed)
Attempt x 2 unsuccessful to start 2nd IV

## 2016-07-28 ENCOUNTER — Inpatient Hospital Stay (HOSPITAL_COMMUNITY): Payer: Medicare Other

## 2016-07-28 ENCOUNTER — Other Ambulatory Visit: Payer: Medicare Other

## 2016-07-28 DIAGNOSIS — I481 Persistent atrial fibrillation: Secondary | ICD-10-CM | POA: Diagnosis not present

## 2016-07-28 DIAGNOSIS — F0390 Unspecified dementia without behavioral disturbance: Secondary | ICD-10-CM | POA: Diagnosis not present

## 2016-07-28 DIAGNOSIS — R6521 Severe sepsis with septic shock: Secondary | ICD-10-CM | POA: Diagnosis not present

## 2016-07-28 DIAGNOSIS — I4891 Unspecified atrial fibrillation: Secondary | ICD-10-CM | POA: Diagnosis not present

## 2016-07-28 DIAGNOSIS — N2889 Other specified disorders of kidney and ureter: Secondary | ICD-10-CM | POA: Diagnosis present

## 2016-07-28 DIAGNOSIS — D649 Anemia, unspecified: Secondary | ICD-10-CM | POA: Diagnosis present

## 2016-07-28 DIAGNOSIS — E871 Hypo-osmolality and hyponatremia: Secondary | ICD-10-CM | POA: Diagnosis present

## 2016-07-28 DIAGNOSIS — K8020 Calculus of gallbladder without cholecystitis without obstruction: Secondary | ICD-10-CM | POA: Diagnosis present

## 2016-07-28 DIAGNOSIS — I129 Hypertensive chronic kidney disease with stage 1 through stage 4 chronic kidney disease, or unspecified chronic kidney disease: Secondary | ICD-10-CM | POA: Diagnosis present

## 2016-07-28 DIAGNOSIS — A419 Sepsis, unspecified organism: Secondary | ICD-10-CM | POA: Diagnosis not present

## 2016-07-28 DIAGNOSIS — N390 Urinary tract infection, site not specified: Secondary | ICD-10-CM | POA: Diagnosis present

## 2016-07-28 DIAGNOSIS — G309 Alzheimer's disease, unspecified: Secondary | ICD-10-CM | POA: Diagnosis present

## 2016-07-28 DIAGNOSIS — C49A3 Gastrointestinal stromal tumor of small intestine: Secondary | ICD-10-CM | POA: Diagnosis present

## 2016-07-28 DIAGNOSIS — F0280 Dementia in other diseases classified elsewhere without behavioral disturbance: Secondary | ICD-10-CM | POA: Diagnosis present

## 2016-07-28 DIAGNOSIS — E7800 Pure hypercholesterolemia, unspecified: Secondary | ICD-10-CM | POA: Diagnosis present

## 2016-07-28 DIAGNOSIS — N184 Chronic kidney disease, stage 4 (severe): Secondary | ICD-10-CM | POA: Diagnosis not present

## 2016-07-28 DIAGNOSIS — I214 Non-ST elevation (NSTEMI) myocardial infarction: Secondary | ICD-10-CM | POA: Diagnosis not present

## 2016-07-28 DIAGNOSIS — R4182 Altered mental status, unspecified: Secondary | ICD-10-CM | POA: Diagnosis present

## 2016-07-28 DIAGNOSIS — I248 Other forms of acute ischemic heart disease: Secondary | ICD-10-CM | POA: Diagnosis present

## 2016-07-28 DIAGNOSIS — R001 Bradycardia, unspecified: Secondary | ICD-10-CM | POA: Diagnosis not present

## 2016-07-28 DIAGNOSIS — G92 Toxic encephalopathy: Secondary | ICD-10-CM | POA: Diagnosis present

## 2016-07-28 DIAGNOSIS — R740 Nonspecific elevation of levels of transaminase and lactic acid dehydrogenase [LDH]: Secondary | ICD-10-CM | POA: Diagnosis not present

## 2016-07-28 DIAGNOSIS — R0600 Dyspnea, unspecified: Secondary | ICD-10-CM | POA: Diagnosis not present

## 2016-07-28 DIAGNOSIS — C787 Secondary malignant neoplasm of liver and intrahepatic bile duct: Secondary | ICD-10-CM | POA: Diagnosis present

## 2016-07-28 DIAGNOSIS — N179 Acute kidney failure, unspecified: Secondary | ICD-10-CM | POA: Diagnosis not present

## 2016-07-28 DIAGNOSIS — Z993 Dependence on wheelchair: Secondary | ICD-10-CM | POA: Diagnosis not present

## 2016-07-28 DIAGNOSIS — Z452 Encounter for adjustment and management of vascular access device: Secondary | ICD-10-CM | POA: Diagnosis not present

## 2016-07-28 DIAGNOSIS — E872 Acidosis: Secondary | ICD-10-CM | POA: Diagnosis present

## 2016-07-28 DIAGNOSIS — B961 Klebsiella pneumoniae [K. pneumoniae] as the cause of diseases classified elsewhere: Secondary | ICD-10-CM | POA: Diagnosis present

## 2016-07-28 DIAGNOSIS — K7200 Acute and subacute hepatic failure without coma: Secondary | ICD-10-CM | POA: Diagnosis present

## 2016-07-28 DIAGNOSIS — D6959 Other secondary thrombocytopenia: Secondary | ICD-10-CM | POA: Diagnosis present

## 2016-07-28 LAB — CBC
HCT: 27.9 % — ABNORMAL LOW (ref 36.0–46.0)
Hemoglobin: 9.5 g/dL — ABNORMAL LOW (ref 12.0–15.0)
MCH: 30.8 pg (ref 26.0–34.0)
MCHC: 34.1 g/dL (ref 30.0–36.0)
MCV: 90.6 fL (ref 78.0–100.0)
PLATELETS: 63 10*3/uL — AB (ref 150–400)
RBC: 3.08 MIL/uL — ABNORMAL LOW (ref 3.87–5.11)
RDW: 16 % — AB (ref 11.5–15.5)
WBC: 27.3 10*3/uL — AB (ref 4.0–10.5)

## 2016-07-28 LAB — BLOOD CULTURE ID PANEL (REFLEXED)
ACINETOBACTER BAUMANNII: NOT DETECTED
CANDIDA ALBICANS: NOT DETECTED
CANDIDA GLABRATA: NOT DETECTED
CANDIDA PARAPSILOSIS: NOT DETECTED
CANDIDA TROPICALIS: NOT DETECTED
Candida krusei: NOT DETECTED
Carbapenem resistance: NOT DETECTED
ENTEROBACTER CLOACAE COMPLEX: NOT DETECTED
ENTEROBACTERIACEAE SPECIES: DETECTED — AB
ESCHERICHIA COLI: NOT DETECTED
Enterococcus species: NOT DETECTED
HAEMOPHILUS INFLUENZAE: NOT DETECTED
KLEBSIELLA OXYTOCA: NOT DETECTED
KLEBSIELLA PNEUMONIAE: DETECTED — AB
Listeria monocytogenes: NOT DETECTED
Neisseria meningitidis: NOT DETECTED
PROTEUS SPECIES: NOT DETECTED
PSEUDOMONAS AERUGINOSA: NOT DETECTED
STREPTOCOCCUS PYOGENES: NOT DETECTED
STREPTOCOCCUS SPECIES: NOT DETECTED
Serratia marcescens: NOT DETECTED
Staphylococcus aureus (BCID): NOT DETECTED
Staphylococcus species: NOT DETECTED
Streptococcus agalactiae: NOT DETECTED
Streptococcus pneumoniae: NOT DETECTED

## 2016-07-28 LAB — CBC WITH DIFFERENTIAL/PLATELET
BASOS PCT: 0 %
Basophils Absolute: 0 10*3/uL (ref 0.0–0.1)
EOS ABS: 0 10*3/uL (ref 0.0–0.7)
EOS PCT: 0 %
HCT: 31.5 % — ABNORMAL LOW (ref 36.0–46.0)
HEMOGLOBIN: 10.8 g/dL — AB (ref 12.0–15.0)
LYMPHS PCT: 2 %
Lymphs Abs: 0.2 10*3/uL — ABNORMAL LOW (ref 0.7–4.0)
MCH: 30.4 pg (ref 26.0–34.0)
MCHC: 34.3 g/dL (ref 30.0–36.0)
MCV: 88.7 fL (ref 78.0–100.0)
Monocytes Absolute: 0.5 10*3/uL (ref 0.1–1.0)
Monocytes Relative: 5 %
NEUTROS PCT: 93 %
Neutro Abs: 9.8 10*3/uL — ABNORMAL HIGH (ref 1.7–7.7)
Platelets: 69 10*3/uL — ABNORMAL LOW (ref 150–400)
RBC: 3.55 MIL/uL — ABNORMAL LOW (ref 3.87–5.11)
RDW: 15.5 % (ref 11.5–15.5)
WBC: 10.5 10*3/uL (ref 4.0–10.5)

## 2016-07-28 LAB — COMPREHENSIVE METABOLIC PANEL
ALK PHOS: 112 U/L (ref 38–126)
ALT: 164 U/L — ABNORMAL HIGH (ref 14–54)
ALT: 184 U/L — ABNORMAL HIGH (ref 14–54)
ANION GAP: 7 (ref 5–15)
ANION GAP: 11 (ref 5–15)
AST: 172 U/L — ABNORMAL HIGH (ref 15–41)
AST: 194 U/L — ABNORMAL HIGH (ref 15–41)
Albumin: 2.8 g/dL — ABNORMAL LOW (ref 3.5–5.0)
Albumin: 3.2 g/dL — ABNORMAL LOW (ref 3.5–5.0)
Alkaline Phosphatase: 208 U/L — ABNORMAL HIGH (ref 38–126)
BUN: 29 mg/dL — ABNORMAL HIGH (ref 6–20)
BUN: 30 mg/dL — ABNORMAL HIGH (ref 6–20)
CALCIUM: 7.1 mg/dL — AB (ref 8.9–10.3)
CHLORIDE: 96 mmol/L — AB (ref 101–111)
CO2: 20 mmol/L — AB (ref 22–32)
CO2: 23 mmol/L (ref 22–32)
Calcium: 7.8 mg/dL — ABNORMAL LOW (ref 8.9–10.3)
Chloride: 101 mmol/L (ref 101–111)
Creatinine, Ser: 1.65 mg/dL — ABNORMAL HIGH (ref 0.44–1.00)
Creatinine, Ser: 1.68 mg/dL — ABNORMAL HIGH (ref 0.44–1.00)
GFR calc non Af Amer: 27 mL/min — ABNORMAL LOW (ref >60)
GFR, EST AFRICAN AMERICAN: 32 mL/min — AB (ref >60)
GFR, EST AFRICAN AMERICAN: 31 mL/min — AB (ref >60)
GFR, EST NON AFRICAN AMERICAN: 27 mL/min — AB (ref >60)
Glucose, Bld: 156 mg/dL — ABNORMAL HIGH (ref 65–99)
Glucose, Bld: 95 mg/dL (ref 65–99)
POTASSIUM: 3.7 mmol/L (ref 3.5–5.1)
Potassium: 3.9 mmol/L (ref 3.5–5.1)
SODIUM: 128 mmol/L — AB (ref 135–145)
SODIUM: 130 mmol/L — AB (ref 135–145)
TOTAL PROTEIN: 5.2 g/dL — AB (ref 6.5–8.1)
Total Bilirubin: 3.5 mg/dL — ABNORMAL HIGH (ref 0.3–1.2)
Total Bilirubin: 3.8 mg/dL — ABNORMAL HIGH (ref 0.3–1.2)
Total Protein: 5.7 g/dL — ABNORMAL LOW (ref 6.5–8.1)

## 2016-07-28 LAB — BLOOD GAS, ARTERIAL
ACID-BASE DEFICIT: 5.3 mmol/L — AB (ref 0.0–2.0)
BICARBONATE: 19.3 mmol/L — AB (ref 20.0–28.0)
DRAWN BY: 235321
O2 CONTENT: 2 L/min
O2 SAT: 98.1 %
PATIENT TEMPERATURE: 37.2
PH ART: 7.341 — AB (ref 7.350–7.450)
pCO2 arterial: 36.7 mmHg (ref 32.0–48.0)
pO2, Arterial: 120 mmHg — ABNORMAL HIGH (ref 83.0–108.0)

## 2016-07-28 LAB — URINE MICROSCOPIC-ADD ON

## 2016-07-28 LAB — URINALYSIS, ROUTINE W REFLEX MICROSCOPIC
Glucose, UA: NEGATIVE mg/dL
Ketones, ur: NEGATIVE mg/dL
NITRITE: POSITIVE — AB
PH: 5 (ref 5.0–8.0)
Protein, ur: 100 mg/dL — AB
SPECIFIC GRAVITY, URINE: 1.024 (ref 1.005–1.030)

## 2016-07-28 LAB — GLUCOSE, CAPILLARY: Glucose-Capillary: 146 mg/dL — ABNORMAL HIGH (ref 65–99)

## 2016-07-28 LAB — CARBOXYHEMOGLOBIN
CARBOXYHEMOGLOBIN: 0.8 % (ref 0.5–1.5)
Methemoglobin: 0.7 % (ref 0.0–1.5)
O2 SAT: 75.5 %
TOTAL HEMOGLOBIN: 9.5 g/dL — AB (ref 12.0–16.0)

## 2016-07-28 LAB — MAGNESIUM: Magnesium: 1.4 mg/dL — ABNORMAL LOW (ref 1.7–2.4)

## 2016-07-28 LAB — PHOSPHORUS: PHOSPHORUS: 2.5 mg/dL (ref 2.5–4.6)

## 2016-07-28 LAB — LIPASE, BLOOD: LIPASE: 72 U/L — AB (ref 11–51)

## 2016-07-28 LAB — MRSA PCR SCREENING: MRSA BY PCR: NEGATIVE

## 2016-07-28 LAB — RAPID HIV SCREEN (HIV 1/2 AB+AG)
HIV 1/2 ANTIBODIES: NONREACTIVE
HIV-1 P24 ANTIGEN - HIV24: NONREACTIVE

## 2016-07-28 LAB — I-STAT CG4 LACTIC ACID, ED: Lactic Acid, Venous: 4.33 mmol/L (ref 0.5–1.9)

## 2016-07-28 MED ORDER — LEVOFLOXACIN IN D5W 750 MG/150ML IV SOLN
750.0000 mg | INTRAVENOUS | Status: DC
  Administered 2016-07-30: 750 mg via INTRAVENOUS
  Filled 2016-07-28 (×2): qty 150

## 2016-07-28 MED ORDER — SODIUM CHLORIDE 0.9 % IV SOLN: INTRAVENOUS | Status: DC | PRN

## 2016-07-28 MED ORDER — SODIUM CHLORIDE 0.9 % IV SOLN
INTRAVENOUS | Status: DC
  Administered 2016-07-29: 02:00:00 via INTRAVENOUS

## 2016-07-28 MED ORDER — NOREPINEPHRINE BITARTRATE 1 MG/ML IV SOLN
2.0000 ug/min | INTRAVENOUS | Status: DC
  Administered 2016-07-28: 20 ug/min via INTRAVENOUS
  Administered 2016-07-28: 10 ug/min via INTRAVENOUS
  Administered 2016-07-29: 7 ug/min via INTRAVENOUS
  Filled 2016-07-28 (×6): qty 4

## 2016-07-28 MED ORDER — ENOXAPARIN SODIUM 30 MG/0.3ML ~~LOC~~ SOLN
30.0000 mg | SUBCUTANEOUS | Status: DC
  Administered 2016-07-28: 30 mg via SUBCUTANEOUS
  Filled 2016-07-28: qty 0.3

## 2016-07-28 MED ORDER — LIP MEDEX EX OINT
TOPICAL_OINTMENT | CUTANEOUS | Status: AC
  Administered 2016-07-28: 23:00:00
  Filled 2016-07-28: qty 7

## 2016-07-28 MED ORDER — ENOXAPARIN SODIUM 40 MG/0.4ML ~~LOC~~ SOLN: 40.0000 mg | SUBCUTANEOUS | Status: DC

## 2016-07-28 MED ORDER — SODIUM CHLORIDE 0.9 % IV SOLN: 250.0000 mL | INTRAVENOUS | Status: DC | PRN

## 2016-07-28 MED ORDER — SODIUM CHLORIDE 0.9 % IV SOLN: 1500.0000 mg | INTRAVENOUS | Status: DC

## 2016-07-28 MED ORDER — VANCOMYCIN HCL 10 G IV SOLR
2000.0000 mg | Freq: Once | INTRAVENOUS | Status: AC
  Administered 2016-07-28: 2000 mg via INTRAVENOUS
  Filled 2016-07-28: qty 2000

## 2016-07-28 MED ORDER — DEXTROSE 5 % IV SOLN
2.0000 g | Freq: Three times a day (TID) | INTRAVENOUS | Status: DC
  Administered 2016-07-28 – 2016-07-29 (×3): 2 g via INTRAVENOUS
  Filled 2016-07-28 (×4): qty 2

## 2016-07-28 MED ORDER — HYDROCORTISONE NA SUCCINATE PF 100 MG IJ SOLR
50.0000 mg | Freq: Four times a day (QID) | INTRAMUSCULAR | Status: DC
  Administered 2016-07-28 – 2016-07-29 (×5): 50 mg via INTRAVENOUS
  Filled 2016-07-28 (×5): qty 2

## 2016-07-28 MED ORDER — MAGNESIUM SULFATE 2 GM/50ML IV SOLN
2.0000 g | Freq: Once | INTRAVENOUS | Status: AC
  Administered 2016-07-28: 2 g via INTRAVENOUS
  Filled 2016-07-28: qty 50

## 2016-07-28 MED ORDER — VASOPRESSIN 20 UNIT/ML IV SOLN
0.0300 [IU]/min | INTRAVENOUS | Status: DC
  Administered 2016-07-28: 0.03 [IU]/min via INTRAVENOUS
  Filled 2016-07-28 (×2): qty 2

## 2016-07-28 NOTE — ED Provider Notes (Signed)
While awaiting admission, patient became hypotensive Central line placed Consent already received from family  CENTRAL LINE Performed by: Sharyon Cable Consent: The procedure was performed in an emergent situation. Required items: required devices, and special equipment available Patient identity confirmed: arm band and provided demographic data Time out: Immediately prior to procedure a "time out" was called to verify the correct patient Indications: vascular access Anesthesia: local infiltration Local anesthetic: lidocaine  Anesthetic total: 3 ml Patient sedated: no Preparation: skin prepped with 2% chlorhexidine Skin prep agent dried: skin prep agent completely dried prior to procedure Sterile barriers: all five maximum sterile barriers used - cap, mask, sterile gown, sterile gloves, and large sterile sheet Hand hygiene: hand hygiene performed prior to central venous catheter insertion Location details: right IJ Catheter type: triple lumen Pre-procedure: landmarks identified Ultrasound guidance: yes Successful placement: yes Post-procedure: line sutured and dressing applied Assessment: blood return through all parts, free fluid flow, placement verified by x-ray and no pneumothorax on x-ray Patient tolerance: Patient tolerated the procedure well with no immediate complications.      [name], MD 07/28/16 [phone]

## 2016-07-28 NOTE — Care Management Note (Signed)
Case Management Note  Patient Details  Name: Melanie Trevino MRN: [id number] Date of Birth: 01/02/1928  Subjective/Objective:          Sepsis and hemodynamic montoring          Action/Plan:  Home when stable   Expected Discharge Date:                  Expected Discharge Plan:  Home/Self Care  In-House Referral:     Discharge planning Services     Post Acute Care Choice:    Choice offered to:     DME Arranged:    DME Agency:     HH Arranged:    HH Agency:     Status of Service:  In process, will continue to follow  If discussed at Long Length of Stay Meetings, dates discussed:    Additional Comments:Date:  July 28, 2016 Chart reviewed for concurrent status and case management needs. Will continue to follow the patient for status change: Discharge Planning: following for needs Expected discharge date: [id number] Velva Harman, BSN, [place], [place]  Leeroy Cha, RN 07/28/2016, 10:38 AM

## 2016-07-28 NOTE — Progress Notes (Signed)
PHARMACY - PHYSICIAN COMMUNICATION CRITICAL VALUE ALERT - BLOOD CULTURE IDENTIFICATION (BCID)  Results for orders placed or performed during the hospital encounter of 07/27/16  Blood Culture ID Panel (Reflexed) (Collected: 07/27/2016 11:30 PM)  Result Value Ref Range   Enterococcus species NOT DETECTED NOT DETECTED   Listeria monocytogenes NOT DETECTED NOT DETECTED   Staphylococcus species NOT DETECTED NOT DETECTED   Staphylococcus aureus NOT DETECTED NOT DETECTED   Streptococcus species NOT DETECTED NOT DETECTED   Streptococcus agalactiae NOT DETECTED NOT DETECTED   Streptococcus pneumoniae NOT DETECTED NOT DETECTED   Streptococcus pyogenes NOT DETECTED NOT DETECTED   Acinetobacter baumannii NOT DETECTED NOT DETECTED   Enterobacteriaceae species DETECTED (A) NOT DETECTED   Enterobacter cloacae complex NOT DETECTED NOT DETECTED   Escherichia coli NOT DETECTED NOT DETECTED   Klebsiella oxytoca NOT DETECTED NOT DETECTED   Klebsiella pneumoniae DETECTED (A) NOT DETECTED   Proteus species NOT DETECTED NOT DETECTED   Serratia marcescens NOT DETECTED NOT DETECTED   Carbapenem resistance NOT DETECTED NOT DETECTED   Haemophilus influenzae NOT DETECTED NOT DETECTED   Neisseria meningitidis NOT DETECTED NOT DETECTED   Pseudomonas aeruginosa NOT DETECTED NOT DETECTED   Candida albicans NOT DETECTED NOT DETECTED   Candida glabrata NOT DETECTED NOT DETECTED   Candida krusei NOT DETECTED NOT DETECTED   Candida parapsilosis NOT DETECTED NOT DETECTED   Candida tropicalis NOT DETECTED NOT DETECTED    Name of physician (or Provider) Contacted: [place]  Changes to prescribed antibiotics required: None tonight-->Consider d/c Vanc & Levaquin (hx resistance in urine cx) on daily rounds.  Currently dosed q48hrs so next dose not due until 9/27.  Biagio Borg 07/28/2016  10:14 PM

## 2016-07-28 NOTE — Progress Notes (Signed)
Pharmacy Antibiotic Note  Melanie Trevino is a 82 y.o. female admitted on 07/27/2016 with sepsis.  Pharmacy has been consulted for Vancomycin, Aztreonam & Levofloxacin dosing.  Plan: Vancomycin 2gm IV x 1 dose followed by 1500mg  IV q48h  Vancomycin trough level = 15-20 mcg/ml Levofloxacin 750mg  IV q48h Aztreonam 2gm IV q8h F/U cultures, renal function, clinical course  Height: 5\' 4"  (162.6 cm) Weight: 250 lb (113.4 kg) IBW/kg (Calculated) : 54.7  Temp (24hrs), Avg:101.9 F (38.8 C), Min:101.9 F (38.8 C), Max:101.9 F (38.8 C)   Recent Labs Lab 07/27/16 2333 07/27/16 2357  WBC 10.5  --   CREATININE 1.68*  --   LATICACIDVEN  --  4.54*    Estimated Creatinine Clearance: 30.2 mL/min (by C-G formula based on SCr of 1.68 mg/dL (H)).  CrCl (n) = 27 ml/min  Allergies  Allergen Reactions  . Shellfish Allergy Anaphylaxis  . Other Other (See Comments)    HORSE SERUMYDrug[Other] swelling6/09/2006 12:00:00 AM by Erin Hearing CPhT  . Amoxicillin Rash and Swelling    Antimicrobials this admission: 9/25 Vanc >>   9/25 Aztreonam >>   9/25 Levofloxacin >>  Dose adjustments this admission:    Microbiology results: 9/25 BCx: sent 9/25 UCx: sent   Thank you for allowing pharmacy to be a part of this patient's care.  Everette Rank, PharmD 07/28/2016 12:30 AM

## 2016-07-28 NOTE — Progress Notes (Signed)
eLink Physician-Brief Progress Note Patient Name: Melanie Trevino DOB: 02-Feb-1928 MRN: [id number]   Date of Service  07/28/2016  HPI/Events of Note  Note Klebsiella confirmed on blood cx, prior has been R to quinolones, S to cephalosporins. Currently on vanco + levaquin + aztreonam. Suspect we can narrow before next doses are due.   eICU Interventions       Intervention Category Major Interventions: Infection - evaluation and management  Denym Christenberry S. 07/28/2016, 10:13 PM

## 2016-07-28 NOTE — ED Notes (Signed)
All jewelry given to son at bedside except for rings-2 yellow tone bands with clear stones right ring finger and silver toned ring with clear stones right pinkie finger

## 2016-07-28 NOTE — H&P (Signed)
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 She has 24h caregivers who have noted low grade fevers (100.4) for the past several days. She has had some nausea and vomiting as well as loose stools. Her mental status has been worse than her baseline - she has been more confused and agitated than usual. She currently denies pain, cough, fever, chills, nausea, abdominal pain, dysuria, headache, visual changes, neck pain. The bulk of the history is provided by her daughter-in-law. Per previous discussions, the patient has been adamant that she remain a Full Code. She underwent routine imaging recently at [facility] and there was an incidental finding of cholelithiasis. There were plans to follow this up with MRCP - scheduled for 9/25  In the ED she was noted to be hypotensive despite 3L fluid. Code sepsis was initiated and she was started on vanc/levaquin/aztreonam. Cultures were drawn. Lactate was elevated at 4.54, although her BMP does not suggest a significant acidosis. AST/ALT/AlkP and T bili are all somewhat elevated, similar to values seen in June 2017. UA not yet available. CBC with L shift and thrombocytopenia. WBC 10.5, but this is a relative leukocytosis for her (nomally she runs <6). No ABG available.     SUBJECTIVE: More alert, repeats the same questions Afebrile Poor UO On 20 mcg levophed   VITAL SIGNS: BP (!) 85/40   Pulse 70   Temp 99.1 F (37.3 C) (Core (Comment))   Resp (!) 23   Ht 5\' 4"  (1.626 m)   Wt 202 lb 9.6 oz (91.9 kg)   SpO2  100%   BMI 34.78 kg/m   HEMODYNAMICS: CVP:  [12 mmHg] 12 mmHg  VENTILATOR SETTINGS:    INTAKE / OUTPUT: I/O last 3 completed shifts: In: 2325.4 [I.V.:115.4; Other:10; IV Piggyback:2200] Out: 210 [Urine:210]  PHYSICAL EXAMINATION:  General Well nourished, well developed, obese, no apparent distress  HEENT No gross abnormalities. Oropharynx clear.   Pulmonary Clear to auscultation bilaterally with no wheezes, rales or ronchi. Good effort, symmetrical expansion.   Cardiovascular Normal rate, regular rhythm. S1, s2. No m/r/g. Distal pulses palpable.  Abdomen Soft, non-tender, non-distended, positive bowel sounds, no palpable organomegaly or masses. Umbilical hernia easily reduced.  Normoresonant to percussion.  Musculoskeletal Lower extremities with contractures at the knees. Otherwise grossly normal.   Lymphatics No cervical, supraclavicular or axillary adenopathy.   Neurologic Confused, repeats same questions. No focal deficits.   Skin/Integuement No rash, no cyanosis, no clubbing. Chronic venous stasis changes b/l legs. No edema.      LABS:  BMET  Recent Labs Lab 07/27/16 2333 07/28/16 0737  NA 130* 128*  K 3.7 3.9  CL 96* 101  CO2 23 20*  BUN 29* 30*  CREATININE 1.68* 1.65*  GLUCOSE 95 156*    Electrolytes  Recent Labs Lab 07/27/16 2333 07/28/16 0737  CALCIUM 7.8* 7.1*  MG  --  1.4*  PHOS  --  2.5    CBC  Recent Labs Lab 07/27/16 2333 07/28/16 0737  WBC 10.5 27.3*  HGB 10.8* 9.5*  HCT 31.5* 27.9*  PLT 69* 63*    Coag's No results for input(s): APTT, INR in the last 168 hours.  Sepsis Markers  Recent Labs Lab 07/27/16 2357 07/28/16 0256  LATICACIDVEN 4.54* 4.33*    ABG  Recent Labs Lab 07/28/16 0612  PHART 7.341*  PCO2ART 36.7  PO2ART 120*    Liver Enzymes  Recent Labs Lab 07/27/16 2333 06/03/16 - [facility] 04/15/16 - [facility]  AST 194* 51 38  ALT 184* 36 103  ALKPHOS 208* 299 288  BILITOT 3.8* 0.9 4.1  ALBUMIN 3.2* 3.0 2.8   Lipase  72  Cardiac Enzymes No results for input(s): TROPONINI, PROBNP in the last 168 hours.  Glucose  Recent Labs Lab 07/28/16 0743  GLUCAP 146*    Imaging Dg Chest Port 1 View  Result Date: 07/28/2016 CLINICAL DATA:  82 year old female with central line placement. EXAM: PORTABLE CHEST 1 VIEW COMPARISON:  Chest radiograph dated 07/27/2016 FINDINGS: There has been interval placement of a right IJ central line with tip over central SVC. No pneumothorax. The lungs are clear. The right costophrenic angle has been excluded from the image. There is widened appearance of the mediastinum, likely related to rotation and patient positioning. Repeat radiograph with better positioning of the patient may provide better evaluation of the mediastinum. There is cardiomegaly. There is atherosclerotic calcification of the thoracic aorta. There is degenerative changes of the spine and shoulders. Multiple surgical clips noted at the base of the neck on the right. IMPRESSION: Interval placement of a right IJ central line with tip over central SVC. No pneumothorax. Widened appearance of the mediastinum, likely positional. Repeat radiograph with better positioning of the patient may provide better evaluation of the mediastinal contour. Electronically Signed   By: Anner Crete M.D.   On: 07/28/2016 04:16   Dg Chest Port 1 View  Result Date: 07/28/2016 CLINICAL DATA:  Acute onset of altered mental status. Initial encounter. EXAM: PORTABLE CHEST 1 VIEW COMPARISON:  Chest radiograph performed 07/07/2015 FINDINGS: The lungs are well-aerated. There is elevation of the right hemidiaphragm. There is no evidence of focal opacification, pleural effusion or pneumothorax. The cardiomediastinal silhouette is enlarged. No acute osseous abnormalities are seen. Degenerative change is noted at both glenohumeral joints, worse on the left. IMPRESSION: Elevation of the right hemidiaphragm. Lungs remain grossly clear. Cardiomegaly.  Electronically Signed   By: Garald Balding M.D.   On: 07/28/2016 01:03   US Abdomen Limited Ruq  Result Date: 07/28/2016 CLINICAL DATA:  82 year old female with cholelithiasis. Altered mental status and evidence of septic shock. Initial encounter. EXAM: US ABDOMEN LIMITED - RIGHT UPPER QUADRANT COMPARISON:  Ultrasound 07/07/2015. FINDINGS: Gallbladder: Small echogenic stones in the gallbladder fundus individually measuring about 9 mm. Gallbladder wall thickness remains normal at 2 mm. No pericholecystic fluid. No sonographic Murphy sign elicited. Some superimposed gallbladder sludge is suspected (image 20). Common bile duct: Diameter: 8 mm, previously 6-7 mm. Liver: No intrahepatic biliary ductal dilatation is evident. No discrete liver lesion. Hepatic echogenicity within normal limits. Other findings: Negative visible right kidney. IMPRESSION: 1. Upper limits of normal to mildly enlarged CBD, but no intrahepatic biliary ductal dilatation to strongly suggest acute biliary obstruction. Significance is doubtful in the absence of hyperbilirubinemia, but if the bilirubin is elevated then consider early acute biliary obstruction. 2. Chronic cholelithiasis, and probably also gallbladder sludge, without evidence of acute cholecystitis. Electronically Signed   By: [name] M.D.   On: 07/28/2016 08:54    STUDIES:  RUQ [place]  9/25 >> Upper limits  of normal to mildly enlarged CBD, but no intrahepatic biliary ductal dilatation to strongly suggest acute biliary obstruction.  Chronic cholelithiasis, and probably also gallbladder sludge, without evidence of acute cholecystitis  CULTURES: Urine culture 9/25 (not yet collected) >> Blood cultures 9/25 >>  ANTIBIOTICS: Vancomycin 9/25 >> Levaquin 9/25 >> Aztreonam 9/25 >>  SIGNIFICANT EVENTS: CVC RIJ ( by EDP ) >>  LINES/TUBES: CVC 9/25 >> Foley 9/25 >>  DISCUSSION:  septic shock secondary to urinary vs biliary source. She has trouble with frequent UTIs, chr  indwelling foley -last changed by [facility] on 9/23  and family reports dark/cloudy urine. She also has known cholelithiasis and her liver enzymes are markedly elevated compared to 7 weeks ago.    ASSESSMENT / PLAN:  PULMONARY A: No acute issues P:   Supplemental O2 if needed to maintain sats > 92%  CARDIOVASCULAR A:  Septic shock P:  Ct levophed to maintain MAP > 65 Add vaso Stress dose steroids  RENAL A:   Acute kidney injury - Cr. 1.68 from baseline ~1 Elevated lactate w/ normal anion gap Known renal mass concerning for RCC Hyponatremia, hypomag P:  Trend lactate Trend Cr Avoid nephrotoxins Pharmacy to dose antibiotics  GASTROINTESTINAL A:   Elevated liver enzymes Known cholelithiasis  Nausea/vomiting P:   Trend enzymes NPO for now  HEMATOLOGIC A:   Chronic anemia - stable Thrombocytopenia - stable P:  Trend CBC Hold gleevec  INFECTIOUS A:   Septic shock 2/2 urinary vs biliary source  P:   Continue current antibiotics (vanc/levaquin/aztreonam) Follow cultures  ENDOCRINE A:   No acute issues   P:   Follow CBG  NEUROLOGIC A:   Altered mental status - likely toxic/metabolic encephalopathy in setting of septic shock Hx alzheimer's dementia Hx R facial nerve weakness  Hx acoustic neuroma (R) s/p resection P:   RASS goal: 0 High risk for delirium  Consider prn haldol if agitated   FAMILY  - Updates: daughter in law at bedside. The patient's 2 sons (one orthodontist) are her 88. Prior discussions have maintained that she remain a FULL CODE.  - Inter-disciplinary family meet or Palliative Care meeting due by:  day 7  My addn cc time x 30 mins  Kara Mead MD. FCCP. [facility] Pager [phone] If no response call [phone]    07/28/2016, 9:06 AM

## 2016-07-28 NOTE — Progress Notes (Signed)
eLink Physician-Brief Progress Note Patient Name: Melanie Trevino DOB: 06/02/1928 MRN: [id number]   Date of Service  07/28/2016  HPI/Events of Note  CVP = 12.  eICU Interventions  Will order COOX.     Intervention Category Major Interventions: Acid-Base disturbance - evaluation and management  Sommer,Steven Cornelia Copa 07/28/2016, 6:37 AM

## 2016-07-28 NOTE — H&P (Signed)
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 She has 24h caregivers who have noted low grade fevers (100.4) for the past several days. She has had some nausea and vomiting as well as loose stools. Her mental status has been worse than her baseline - she has been more confused and agitated than usual. She currently denies pain, cough, fever, chills, nausea, abdominal pain, dysuria, headache, visual changes, neck pain. The bulk of the history is provided by her daughter-in-law. Per previous discussions, the patient has been adamant that she remain a Full Code. She underwent routine imaging recently at [facility] and there was an incidental finding of cholelithiasis. There were plans to follow this up with MRCP - scheduled for tomorrow.   In the ED she was noted to be hypotensive despite 3L fluid. Code sepsis was initiated and she was started on vanc/levaquin/aztreonam. Cultures were drawn. Lactate was elevated at 4.54, although her BMP does not suggest a significant acidosis. AST/ALT/AlkP and T bili are all somewhat elevated, similar to values seen in June 2017. UA not yet available. CBC with L shift and thrombocytopenia. WBC 10.5, but this is a relative leukocytosis for her (nomally she runs <6). No ABG available.   PAST MEDICAL HISTORY :  She  has a past medical history of Breast cancer (Melanie Trevino) (08/18/2012); Cancer (Melanie Trevino); Chronic indwelling Foley catheter (07/08/2015); Dementia of the Alzheimer's type (07/08/2015); Gastrointestinal stromal tumor (GIST) (07/08/2015);  High cholesterol; Hypertension; Liver metastasis (Melanie Trevino) (07/08/2015); Renal mass (07/08/2015); and Wheelchair bound (07/08/2015).  PAST SURGICAL HISTORY: She  has a past surgical history that includes Mastectomy (Right).  Allergies  Allergen Reactions  . Shellfish Allergy Anaphylaxis  . Other Other (See Comments)    HORSE SERUMYDrug[Other] swelling6/09/2006 12:00:00 AM by Erin Hearing CPhT  . Amoxicillin Rash and Swelling    No current facility-administered medications on file prior to encounter.    Current Outpatient Prescriptions on File Prior to Encounter  Medication Sig  . albuterol (PROVENTIL HFA;VENTOLIN HFA) 108 (90 BASE) MCG/ACT inhaler Inhale 2 puffs into the lungs every 4 (four) hours as needed for wheezing or shortness of breath (cough, shortness of breath or wheezing.).  Marland Kitchen docusate sodium (COLACE) 100 MG capsule Take 100 mg by mouth 2 (two) times daily. Reported on 04/09/2016  . donepezil (ARICEPT) 10 MG tablet Take 10 mg by mouth at bedtime.  . fluticasone (FLONASE) 50 MCG/ACT nasal spray Place 1 spray into both nostrils daily.   . furosemide (LASIX) 40 MG tablet Take 1 tablet (40 mg total) by mouth daily. (Patient taking differently: Take 40 mg by mouth 2 (two) times daily. )  . Imatinib Mesylate (GLEEVEC PO) Take 400 mg by mouth daily.   Marland Kitchen losartan (COZAAR) 50 MG tablet Take 1 tablet (50 mg total) by mouth daily. (Patient taking differently: Take 50 mg by mouth 2 (two) times daily. )  . methocarbamol (ROBAXIN) 500 MG tablet Take 500 mg by mouth every 8 (eight) hours as needed for muscle spasms.   . mirabegron ER (MYRBETRIQ) 50 MG TB24 tablet Take 50 mg  by mouth at bedtime.   . potassium chloride SA (K-DUR,KLOR-CON) 20 MEQ tablet Take 2 tablets (40 mEq total) by mouth daily. (Patient taking differently: Take 40 mEq by mouth 2 (two) times daily. )  . Spacer/Aero-Holding Dorise Bullion Use with MDI as directed  . vitamin B-12 (CYANOCOBALAMIN) 1000 MCG tablet Take 1,000 mcg by mouth 2  (two) times daily.   . ciprofloxacin (CIPRO) 250 MG tablet Take 1 tablet (250 mg total) by mouth 2 (two) times daily. (Patient not taking: Reported on 07/28/2016)    FAMILY HISTORY:  Her indicated that her mother is deceased. She indicated that her father is deceased.    SOCIAL HISTORY: She  reports that she has never smoked. She has never used smokeless tobacco. She reports that she does not drink alcohol or use drugs.  REVIEW OF SYSTEMS:   See HPI for pertinent positives and negatives. Complete ROS difficult to obtain 2/2 mental status and agitation.  SUBJECTIVE:    VITAL SIGNS: BP (!) 65/34 (BP Location: Left Arm)   Pulse 79   Temp 101.9 F (38.8 C) (Rectal)   Resp 22   Ht 5\' 4"  (1.626 m)   Wt 113.4 kg (250 lb)   SpO2 99%   BMI 42.91 kg/m   HEMODYNAMICS:    VENTILATOR SETTINGS:    INTAKE / OUTPUT: No intake/output data recorded.  PHYSICAL EXAMINATION:  General Well nourished, well developed, obese, no apparent distress  HEENT No gross abnormalities. Oropharynx clear.   Pulmonary Clear to auscultation bilaterally with no wheezes, rales or ronchi. Good effort, symmetrical expansion.   Cardiovascular Normal rate, regular rhythm. S1, s2. No m/r/g. Distal pulses palpable.  Abdomen Soft, non-tender, non-distended, positive bowel sounds, no palpable organomegaly or masses. Umbilical hernia easily reduced.  Normoresonant to percussion.  Musculoskeletal Lower extremities with contractures at the knees. Otherwise grossly normal.   Lymphatics No cervical, supraclavicular or axillary adenopathy.   Neurologic Grossly intact. No focal deficits.   Skin/Integuement No rash, no cyanosis, no clubbing. Chronic venous stasis changes b/l legs. No edema.      LABS:  BMET  Recent Labs Lab 07/27/16 2333  NA 130*  K 3.7  CL 96*  CO2 23  BUN 29*  CREATININE 1.68*  GLUCOSE 95    Electrolytes  Recent Labs Lab 07/27/16 2333  CALCIUM 7.8*    CBC  Recent Labs Lab  07/27/16 2333  WBC 10.5  HGB 10.8*  HCT 31.5*  PLT 69*    Coag's No results for input(s): APTT, INR in the last 168 hours.  Sepsis Markers  Recent Labs Lab 07/27/16 2357 07/28/16 0256  LATICACIDVEN 4.54* 4.33*    ABG No results for input(s): PHART, PCO2ART, PO2ART in the last 168 hours.  Liver Enzymes  Recent Labs Lab 07/27/16 2333 06/03/16 - [facility] 04/15/16 - [place]  AST 194* 51 38  ALT 184* 36 103  ALKPHOS 208* 299 288  BILITOT 3.8* 0.9 4.1  ALBUMIN 3.2* 3.0 2.8  Lipase  72  Cardiac Enzymes No results for input(s): TROPONINI, PROBNP in the last 168 hours.  Glucose No results for input(s): GLUCAP in the last 168 hours.  Imaging Dg Chest Port 1 View  Result Date: 07/28/2016 CLINICAL DATA:  Acute onset of altered mental status. Initial encounter. EXAM: PORTABLE CHEST 1 VIEW COMPARISON:  Chest radiograph performed 07/07/2015 FINDINGS: The lungs are well-aerated. There is elevation of the right hemidiaphragm. There is no evidence of focal opacification, pleural effusion or pneumothorax. The cardiomediastinal silhouette is enlarged.  No acute osseous abnormalities are seen. Degenerative change is noted at both glenohumeral joints, worse on the left. IMPRESSION: Elevation of the right hemidiaphragm. Lungs remain grossly clear. Cardiomegaly. Electronically Signed   By: Garald Balding M.D.   On: 07/28/2016 01:03    STUDIES:    CULTURES: Urine culture 9/25 (not yet collected) >> Blood cultures 9/25 >>  ANTIBIOTICS: Vancomycin 9/25 >> Levaquin 9/25 >> Aztreonam 9/25 >>  SIGNIFICANT EVENTS: CVC placement by EDP  LINES/TUBES: CVC 9/25 >> Foley 9/25 >>  DISCUSSION: Ms. 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 She has trouble with frequent UTIs and family reports dark/cloudy urine. She also has known cholelithiasis and her liver enzymes are markedly elevated compared to 7 weeks ago. While she does have an elevated lactate, she does not appear to have a significant associated acidosis with normal serum HCO3 at 23. Empiric treatment with vanc/levaquin/aztreonam was initiated by the ED; this seems reasonable pending further studies. Will obtain RUQ u/s to evaluate for choledocholithiasis or cholecystitis, although no tenderness on exam. Check lipase with hx of nausea/vomiting. Await UA and culture.   ASSESSMENT / PLAN:  PULMONARY A: No acute issues P:   Supplemental O2 if needed to maintain sats > 92%  CARDIOVASCULAR A:  Shock, presumed septic P:  Fluid resuscitate Pressors (levophed) if needed to maintain MAP > 65 Place A-line for accurate BP monitoring  RENAL A:   Acute kidney injury - Cr. 1.68 from baseline ~1 Elevated lactate w/ normal anion gap Known renal mass concerning for RCC P:  Trend lactate Obtain ABG to determine acid base status Trend Cr Avoid nephrotoxins Pharmacy to dose antibiotics  GASTROINTESTINAL A:   Elevated liver enzymes Known cholelithiasis  Nausea/vomiting P:   RUQ u/s Trend enzymes NPO for now  HEMATOLOGIC A:   Chronic anemia - stable Thrombocytopenia - stable P:  Trend CBC  INFECTIOUS A:   Septic shock 2/2 urinary vs biliary source Relative leukocytosis with left shift P:   Continue current antibiotics (vanc/levaquin/aztreonam) Follow cultures  ENDOCRINE A:   No acute issues   P:   Follow CBG  NEUROLOGIC A:   Altered mental status - likely toxic/metabolic encephalopathy in setting of septic shock Hx alzheimer's dementia Hx R facial nerve weakness  Hx acoustic neuroma (R) s/p resection P:   RASS goal: 0 High risk for delirium  Consider prn haldol or low dose seroquel   FAMILY  - Updates: daughter in law at bedside. The  patient's 2 sons are her 64. Prior discussions have maintained that she remain a FULL CODE.  - Inter-disciplinary family meet or Palliative Care meeting due by:  day 7  The patient is critically ill with multiple organ system failure and requires high complexity decision making for assessment and support, frequent evaluation and titration of therapies, advanced monitoring, review of radiographic studies and interpretation of complex data.   Critical Care Time devoted to patient care services, exclusive of separately billable procedures, described in this note is 52 minutes.   Yisroel Ramming, MD [facility] [place] Pager: [phone]  07/28/2016, 3:19 AM

## 2016-07-28 NOTE — Progress Notes (Signed)
eLink Physician-Brief Progress Note Patient Name: Melanie Trevino DOB: 04-17-1928 MRN: [id number]   Date of Service  07/28/2016  HPI/Events of Note  Lactic Acid 4.54 >> 4.33. Hgb = 10.8.  eICU Interventions  Will order: 1. 0.9 NaCl to run at 75 mL/hour. 2. Monitor CVP.     Intervention Category Major Interventions: Acid-Base disturbance - evaluation and management  Sommer,Steven Eugene 07/28/2016, 5:55 AM

## 2016-07-28 NOTE — Procedures (Signed)
Arterial Catheter Insertion Procedure Note Melanie Trevino [id number] 10-May-1928  Procedure: Insertion of Arterial Catheter  Indications: Blood pressure monitoring and Frequent blood sampling  Procedure Details Consent: Risks of procedure as well as the alternatives and risks of each were explained to the (patient/caregiver).  Consent for procedure obtained. Time Out: Verified patient identification, verified procedure, site/side was marked, verified correct patient position, special equipment/implants available, medications/allergies/relevent history reviewed, required imaging and test results available.  Performed  Maximum sterile technique was used including antiseptics, cap, gloves, gown, hand hygiene, mask and sheet. Skin prep: Chlorhexidine; local anesthetic administered 22 gauge catheter was inserted into left radial artery using the Seldinger technique.  Evaluation Blood flow good; BP tracing good. Complications: No apparent complications.   Melanie Trevino 07/28/2016

## 2016-07-28 NOTE — ED Notes (Signed)
Multiple attempts made to obtain a BP manual as well as automatic w/o success.  Will inform Dr. Christy Gentles.

## 2016-07-28 NOTE — Progress Notes (Signed)
CeLink Physician-Brief Progress Note Patient Name: Melanie Trevino DOB: 26-Feb-1928 MRN: [id number]   Date of Service  07/28/2016  HPI/Events of Note  COOX = 75.5.  eICU Interventions  Continue present management.     Intervention Category Major Interventions: Acid-Base disturbance - evaluation and management  Sommer,Steven Eugene 07/28/2016, 6:54 AM

## 2016-07-29 ENCOUNTER — Inpatient Hospital Stay (HOSPITAL_COMMUNITY): Payer: Medicare Other

## 2016-07-29 ENCOUNTER — Encounter (HOSPITAL_COMMUNITY): Payer: Self-pay | Admitting: Cardiology

## 2016-07-29 DIAGNOSIS — R6521 Severe sepsis with septic shock: Secondary | ICD-10-CM

## 2016-07-29 DIAGNOSIS — I481 Persistent atrial fibrillation: Secondary | ICD-10-CM

## 2016-07-29 DIAGNOSIS — R0600 Dyspnea, unspecified: Secondary | ICD-10-CM

## 2016-07-29 DIAGNOSIS — N179 Acute kidney failure, unspecified: Secondary | ICD-10-CM

## 2016-07-29 DIAGNOSIS — N390 Urinary tract infection, site not specified: Secondary | ICD-10-CM

## 2016-07-29 DIAGNOSIS — F0390 Unspecified dementia without behavioral disturbance: Secondary | ICD-10-CM

## 2016-07-29 DIAGNOSIS — I214 Non-ST elevation (NSTEMI) myocardial infarction: Secondary | ICD-10-CM

## 2016-07-29 LAB — TROPONIN I
TROPONIN I: 0.69 ng/mL — AB (ref <0.03)
TROPONIN I: 0.4 ng/mL — AB (ref <0.03)
Troponin I: 0.53 ng/mL (ref <0.03)

## 2016-07-29 LAB — BASIC METABOLIC PANEL
ANION GAP: 9 (ref 5–15)
Anion gap: 7 (ref 5–15)
Anion gap: 9 (ref 5–15)
BUN: 36 mg/dL — ABNORMAL HIGH (ref 6–20)
BUN: 40 mg/dL — AB (ref 6–20)
BUN: 42 mg/dL — AB (ref 6–20)
CALCIUM: 7.4 mg/dL — AB (ref 8.9–10.3)
CALCIUM: 7.2 mg/dL — AB (ref 8.9–10.3)
CALCIUM: 7.1 mg/dL — AB (ref 8.9–10.3)
CO2: 18 mmol/L — ABNORMAL LOW (ref 22–32)
CO2: 18 mmol/L — ABNORMAL LOW (ref 22–32)
CO2: 20 mmol/L — AB (ref 22–32)
Chloride: 103 mmol/L (ref 101–111)
Chloride: 104 mmol/L (ref 101–111)
Chloride: 99 mmol/L — ABNORMAL LOW (ref 101–111)
Creatinine, Ser: 1.72 mg/dL — ABNORMAL HIGH (ref 0.44–1.00)
Creatinine, Ser: 1.71 mg/dL — ABNORMAL HIGH (ref 0.44–1.00)
Creatinine, Ser: 1.74 mg/dL — ABNORMAL HIGH (ref 0.44–1.00)
GFR calc Af Amer: 30 mL/min — ABNORMAL LOW (ref >60)
GFR calc Af Amer: 30 mL/min — ABNORMAL LOW (ref >60)
GFR, EST AFRICAN AMERICAN: 30 mL/min — AB (ref >60)
GFR, EST NON AFRICAN AMERICAN: 26 mL/min — AB (ref >60)
GFR, EST NON AFRICAN AMERICAN: 26 mL/min — AB (ref >60)
GFR, EST NON AFRICAN AMERICAN: 26 mL/min — AB (ref >60)
GLUCOSE: 127 mg/dL — AB (ref 65–99)
GLUCOSE: 188 mg/dL — AB (ref 65–99)
Glucose, Bld: 110 mg/dL — ABNORMAL HIGH (ref 65–99)
POTASSIUM: 4.4 mmol/L (ref 3.5–5.1)
Potassium: 3.5 mmol/L (ref 3.5–5.1)
Potassium: 3.6 mmol/L (ref 3.5–5.1)
SODIUM: 130 mmol/L — AB (ref 135–145)
Sodium: 129 mmol/L — ABNORMAL LOW (ref 135–145)
Sodium: 128 mmol/L — ABNORMAL LOW (ref 135–145)

## 2016-07-29 LAB — ECHOCARDIOGRAM COMPLETE
HEIGHTINCHES: 64 in
WEIGHTICAEL: 3396.85 [oz_av]

## 2016-07-29 LAB — CBC
HEMATOCRIT: 26.8 % — AB (ref 36.0–46.0)
HEMOGLOBIN: 9.2 g/dL — AB (ref 12.0–15.0)
MCH: 30.6 pg (ref 26.0–34.0)
MCHC: 34.3 g/dL (ref 30.0–36.0)
MCV: 89 fL (ref 78.0–100.0)
Platelets: 44 10*3/uL — ABNORMAL LOW (ref 150–400)
RBC: 3.01 MIL/uL — AB (ref 3.87–5.11)
RDW: 16.2 % — ABNORMAL HIGH (ref 11.5–15.5)
WBC: 24.9 10*3/uL — AB (ref 4.0–10.5)

## 2016-07-29 LAB — HEPATITIS PANEL, ACUTE
HEP B S AG: NEGATIVE
Hep A IgM: NEGATIVE
Hep B C IgM: NEGATIVE

## 2016-07-29 LAB — MAGNESIUM: MAGNESIUM: 1.9 mg/dL (ref 1.7–2.4)

## 2016-07-29 LAB — PHOSPHORUS: PHOSPHORUS: 2.9 mg/dL (ref 2.5–4.6)

## 2016-07-29 MED ORDER — AMIODARONE HCL IN DEXTROSE 360-4.14 MG/200ML-% IV SOLN: 30.0000 mg/h | INTRAVENOUS | Status: DC

## 2016-07-29 MED ORDER — AMIODARONE HCL 200 MG PO TABS
200.0000 mg | ORAL_TABLET | Freq: Two times a day (BID) | ORAL | Status: DC
  Administered 2016-07-29 – 2016-07-31 (×5): 200 mg via ORAL
  Filled 2016-07-29 (×5): qty 1

## 2016-07-29 MED ORDER — AMIODARONE HCL IN DEXTROSE 360-4.14 MG/200ML-% IV SOLN
60.0000 mg/h | INTRAVENOUS | Status: DC
  Administered 2016-07-29 (×2): 60 mg/h via INTRAVENOUS
  Filled 2016-07-29 (×2): qty 200

## 2016-07-29 MED ORDER — SODIUM BICARBONATE 8.4 % IV SOLN
INTRAVENOUS | Status: DC
  Administered 2016-07-29 – 2016-07-30 (×2): via INTRAVENOUS
  Filled 2016-07-29 (×2): qty 150

## 2016-07-29 MED ORDER — ASPIRIN 300 MG RE SUPP
150.0000 mg | Freq: Every day | RECTAL | Status: DC
  Administered 2016-07-29: 150 mg via RECTAL
  Filled 2016-07-29: qty 1

## 2016-07-29 MED ORDER — HYDROCORTISONE NA SUCCINATE PF 100 MG IJ SOLR
50.0000 mg | Freq: Two times a day (BID) | INTRAMUSCULAR | Status: DC
  Administered 2016-07-29 – 2016-07-30 (×2): 50 mg via INTRAVENOUS
  Filled 2016-07-29 (×2): qty 2

## 2016-07-29 MED ORDER — AMIODARONE LOAD VIA INFUSION
150.0000 mg | Freq: Once | INTRAVENOUS | Status: AC
  Administered 2016-07-29: 150 mg via INTRAVENOUS
  Filled 2016-07-29: qty 83.34

## 2016-07-29 MED ORDER — ASPIRIN 325 MG PO TABS
325.0000 mg | ORAL_TABLET | Freq: Every day | ORAL | Status: DC
  Filled 2016-07-29 (×2): qty 1

## 2016-07-29 MED ORDER — DEXTROSE 5 % IV SOLN
1.0000 g | Freq: Three times a day (TID) | INTRAVENOUS | Status: DC
  Administered 2016-07-29 – 2016-07-30 (×4): 1 g via INTRAVENOUS
  Filled 2016-07-29 (×4): qty 1

## 2016-07-29 NOTE — Progress Notes (Signed)
*  PRELIMINARY RESULTS* Echocardiogram 2D Echocardiogram has been performed.  Leavy Cella 07/29/2016, 2:10 PM

## 2016-07-29 NOTE — Progress Notes (Signed)
eLink Physician-Brief Progress Note Patient Name: Melanie Trevino DOB: 08-23-1928 MRN: [id number]   Date of Service  07/29/2016  HPI/Events of Note  AFIB with RVR - Ventricular rate = 105 - 135. BP = 103/59.  eICU Interventions  Will order: 1. Cycle Troponin. 2. Amiodarone IV bolus and infusion.  3. Send AM BMP, Mg++ and PO4--- now.      Intervention Category Major Interventions: Arrhythmia - evaluation and management  Sommer,Steven Eugene 07/29/2016, 12:55 AM

## 2016-07-29 NOTE — Progress Notes (Signed)
eLink Physician-Brief Progress Note Patient Name: Melanie Trevino DOB: 06-Nov-1927 MRN: [id number]   Date of Service  07/29/2016  HPI/Events of Note  Troponin = 0.69. EKG - AFIB with RVR, L axis, Low voltage and  possible anteriolateral infarct age undetermined (present on admission EKG). Elevated Troponin related to demand ischemia?  eICU Interventions  Will order: 1. ASA suppository 150 mg PR now and Q day.  2. Continue to trend Troponin.     Intervention Category Intermediate Interventions: Diagnostic test evaluation  Sommer,Steven Eugene 07/29/2016, 2:19 AM

## 2016-07-29 NOTE — Progress Notes (Signed)
CRITICAL VALUE ALERT  Critical value received:  Troponin 0.69  Date of notification:  07/29/2016  Time of notification:  [id number]  Critical value read back:Yes.    Nurse who received alert:  Odis Hollingshead RN   MD notified (1st page):  Dr. Oletta Darter  Time of first page:  0210  Responding MD:  Dr. Oletta Darter  Time MD responded:  [phone]

## 2016-07-29 NOTE — Progress Notes (Signed)
PULMONARY / CRITICAL CARE MEDICINE   Name: Melanie Trevino MRN: [id number] DOB: 06/28/1928    ADMISSION DATE:  07/27/2016 CONSULTATION DATE:  07/28/16  REFERRING MD:  EDP  CHIEF COMPLAINT:  Altered mental status  SUBJECTIVE:  No distress.   VITAL SIGNS: BP 134/63   Pulse 80   Temp 98.8 F (37.1 C) (Core (Comment))   Resp 17   Ht 5\' 4"  (1.626 m)   Wt 212 lb 4.9 oz (96.3 kg)   SpO2 99%   BMI 36.44 kg/m   HEMODYNAMICS: CVP:  [9 mmHg-13 mmHg] 12 mmHg  VENTILATOR SETTINGS:    INTAKE / OUTPUT:  Intake/Output Summary (Last 24 hours) at 07/29/16 [id number] Last data filed at 07/29/16 0900  Gross per 24 hour  Intake          3481.84 ml  Output              750 ml  Net          2731.84 ml     PHYSICAL EXAMINATION:  General Well nourished, well developed, obese, no apparent distress.   HEENT No gross abnormalities. Oropharynx clear. Very hard of hearing   Pulmonary Clear to auscultation bilaterally with no wheezes, rales or ronchi. Good effort, symmetrical expansion.   Cardiovascular Regular irreg. AF w/ CVR  Abdomen Soft, non-tender, non-distended, positive bowel sounds, no palpable organomegaly or masses. Umbilical hernia easily reduced.  Normoresonant to percussion.  Musculoskeletal Lower extremities with contractures at the knees. Otherwise grossly normal.   Lymphatics No cervical, supraclavicular or axillary adenopathy.   Neurologic Confused, repeats same questions. No focal deficits.   Skin/Integuement No rash, no cyanosis, no clubbing. Chronic venous stasis changes b/l legs. No edema.      LABS:  BMET  Recent Labs Lab 07/27/16 2333 07/28/16 0737 07/29/16 0100  NA 130* 128* 130*  K 3.7 3.9 4.4  CL 96* 101 103  CO2 23 20* 18*  BUN 29* 30* 36*  CREATININE 1.68* 1.65* 1.72*  GLUCOSE 95 156* 110*    Electrolytes  Recent Labs Lab 07/27/16 2333 07/28/16 0737 07/29/16 0100  CALCIUM 7.8* 7.1* 7.4*  MG  --  1.4* 1.9  PHOS  --  2.5 2.9    CBC  Recent  Labs Lab 07/27/16 2333 07/28/16 0737 07/29/16 0100  WBC 10.5 27.3* 24.9*  HGB 10.8* 9.5* 9.2*  HCT 31.5* 27.9* 26.8*  PLT 69* 63* 44*    Coag's No results for input(s): APTT, INR in the last 168 hours.  Sepsis Markers  Recent Labs Lab 07/27/16 2357 07/28/16 0256  LATICACIDVEN 4.54* 4.33*    ABG  Recent Labs Lab 07/28/16 0612  PHART 7.341*  PCO2ART 36.7  PO2ART 120*    Liver Enzymes  Recent Labs Lab 07/27/16 2333 06/03/16 - [facility] 04/15/16 - [facility]  AST 194* 51 38  ALT 184* 36 103  ALKPHOS 208* 299 288  BILITOT 3.8* 0.9 4.1  ALBUMIN 3.2* 3.0 2.8  Lipase  72  Cardiac Enzymes  Recent Labs Lab 07/29/16 0100 07/29/16 0634  TROPONINI 0.69* 0.53*    Glucose  Recent Labs Lab 07/28/16 0743  GLUCAP 146*    Imaging US Abdomen Limited Ruq  Result Date: 07/28/2016 CLINICAL DATA:  82 year old female with cholelithiasis. Altered mental status and evidence of septic shock. Initial encounter. EXAM: US ABDOMEN LIMITED - RIGHT UPPER QUADRANT COMPARISON:  Ultrasound 07/07/2015. FINDINGS: Gallbladder: Small echogenic stones in the gallbladder fundus individually measuring about 9 mm. Gallbladder wall thickness remains normal at  2 mm. No pericholecystic fluid. No sonographic Murphy sign elicited. Some superimposed gallbladder sludge is suspected (image 20). Common bile duct: Diameter: 8 mm, previously 6-7 mm. Liver: No intrahepatic biliary ductal dilatation is evident. No discrete liver lesion. Hepatic echogenicity within normal limits. Other findings: Negative visible right kidney. IMPRESSION: 1. Upper limits of normal to mildly enlarged CBD, but no intrahepatic biliary ductal dilatation to strongly suggest acute biliary obstruction. Significance is doubtful in the absence of hyperbilirubinemia, but if the bilirubin is elevated then consider early acute biliary obstruction. 2. Chronic cholelithiasis, and probably also gallbladder sludge, without evidence of acute cholecystitis.  Electronically Signed   By: [name] M.D.   On: 07/28/2016 08:54    STUDIES:  RUQ [place]  9/25 >> Upper limits of normal to mildly enlarged CBD, but no intrahepatic biliary ductal dilatation to strongly suggest acute biliary obstruction.  Chronic cholelithiasis, and probably also gallbladder sludge, without evidence of acute cholecystitis ECHO 9/26>>>  CULTURES: Urine culture 9/25 (not yet collected) >> Blood cultures 9/25 >> Klebsiella>>>  ANTIBIOTICS: Vancomycin 9/25 >>9/26 Levaquin 9/25 >> Aztreonam 9/25 >>  SIGNIFICANT EVENTS: CVC RIJ ( by EDP ) >>  LINES/TUBES: CVC 9/25 >> Foley 9/25 >>  DISCUSSION:  septic shock secondary to urinary vs biliary source (favor UT). She has trouble with frequent UTIs, chr indwelling foley -last changed by [facility] on 9/23  and family reports dark/cloudy urine. She also has known cholelithiasis and her liver enzymes are markedly elevated compared to 7 weeks ago-->may element of shock liver. Blood growing Klebsiella. Pressor requirements improved. Did have AF w/ RVR rate is controlled w/ amio. For today plan: dc vanc (creatinine rising), supplement bicarb for NAG acidosis, wean pressors off, get ECHO to eval for WM abnormality. Have asked cards to see her (this had been a concern by her PCP anyhow), but doubt much to do w/ her thrombocytopenia. I updated her daughter-in-law at bedside.   ASSESSMENT / PLAN:  PULMONARY A: No acute issues P:   Supplemental O2 if needed to maintain sats > 92%  CARDIOVASCULAR A:  Septic shock P:  Ct levophed to maintain MAP > 65 Dc vanc Stress dose steroids amio gtt  Cont asa  Echo to eval for WM abnormality  Will ask cards to see (this had been something her PCP had been concerned about in the out-pt setting).   RENAL A:   Acute kidney injury -> scr bumped a little now up to > 1.7 Known renal mass concerning for RCC Hyponatremia NAG metabolic acidosis  P:  Change IVF to D5W w/ 3 amps bicarb Serial  chemistries  Dc vanc Trend Cr Avoid nephrotoxins Pharmacy to dose antibiotics  GASTROINTESTINAL A:   Elevated liver enzymes-->slightly improved Known cholelithiasis  Nausea/vomiting-->resolved  P:   Trend enzymes Add diet   HEMATOLOGIC A:   Chronic anemia - stable Thrombocytopenia - > worse  P:  Trend CBC Hold gleevec Dc LMWH Add PAS  INFECTIOUS A:   Septic shock 2/2 urinary vs biliary source-->favor UT Klebsiella bacteremia Wbc rising but may be steroid induced P:   See above Dc vanc Narrow GN coverage when sensitivities are back   ENDOCRINE A:   No acute issues   P:   Follow CBG  NEUROLOGIC A:   Altered mental status - likely toxic/metabolic encephalopathy in setting of septic shock-->seemingly improved. Hearing loss is a large contributing factor here  Hx alzheimer's dementia Hx R facial nerve weakness  Hx acoustic neuroma (R) s/p resection P:  RASS goal: 0 High risk for delirium  Consider prn haldol if agitated   FAMILY  - Updates: daughter in law at bedside. The patient's 2 sons (one orthodontist) are her 33. Prior discussions have maintained that she remain a FULL CODE.  - Inter-disciplinary family meet or Palliative Care meeting due by:  day 7     07/29/2016, 8:39 AM

## 2016-07-29 NOTE — Consult Note (Addendum)
Patient ID: Melanie Trevino MRN: [id number], DOB/AGE: [birth year]   Admit date: 07/27/2016   Reason for Consult: Atrial Fibrillation Requesting MD: Dr. Elsworth Soho, Critical Care    Primary Physician: Carlena Sax, MD Primary Cardiologist: Dr. Gwenlyn Found  Pt. Profile:  82 y.o female with dementia, also wheelchair bound and nonambulatory, with h/o a brain tumor resected 11/27/1993 at [facility] and has had breast cancer status post mastectomy, who presented 9/24 with AMS and hypotension. Admitted for septic shock, secondary to bacteremia. Source felt to be UTI. Hospital course further complicated by afib w/ RVR.   Problem List  Past Medical History:  Diagnosis Date  . Breast cancer ([place]) 08/18/2012  . Cancer ([place])   . Chronic indwelling Foley catheter 07/08/2015  . Dementia of the Alzheimer's type 07/08/2015  . 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 High cholesterol   . Hypertension   . Liver metastasis ([place]) 07/08/2015  . Renal mass 07/08/2015  . Wheelchair bound 07/08/2015    Past Surgical History:  Procedure Laterality Date  . MASTECTOMY Right      Allergies  Allergies  Allergen Reactions  . Shellfish Allergy Anaphylaxis  . Other Other (See Comments)    HORSE SERUMYDrug[Other] swelling6/09/2006 12:00:00 AM by Erin Hearing CPhT  . Amoxicillin Rash and Swelling    HPI  82 y.o female with dementia, also wheelchair bound and nonambulatory, with h/o a brain tumor resected 11/27/1993 at [facility] and has had breast cancer status post mastectomy. She was seen by [facility] in 2011 and underwent an echo and Myoview, both of which were normal. She was recently referred back to Dr. Gwenlyn Found by her PCP, Dr. Everlene Farrier, for bradycardia. She had recently been seen in Dr. Perfecto Kingdom office b/c of diarrhea. Routine electrocardiogram revealed sinus bradycardia at a rate of 49. She had not been on an rate lowering medication. Given she was asymptomatic and given her poor functional status, Dr. Gwenlyn Found did not  recommend any further cardiac w/u. She was instructed to f/u as needed. She also has a h/o chronic anemia/ thrombocytopenia. She is hard of hearing. Hears best out of left ear.   She presented to [facility] ED on 07/27/16 with AMS and hypotension. She has 24 hr caregivers that had also noted low grade fevers x several days. She was admitted for septic shock/ bacteremia.  Source felt to be UTI. She was placed on antibiotics. Hospital course complicated by atrial fibrillation w/ RVR. She required initiation of IV amiodarone for rate control, initiated by critical care. Cardiac enzymes were cycled and troponin was 0.69>>0.53. She denies CP. She was given ASA suppository last night. Platelets continue to drop 66>>44K. Hgb is 9.2. She remains in afib but rate is better controlled in the 90s. She is asymptomatic.    Home Medications  Prior to Admission medications   Medication Sig Start Date End Date Taking? Authorizing Provider  albuterol (PROVENTIL HFA;VENTOLIN HFA) 108 (90 BASE) MCG/ACT inhaler Inhale 2 puffs into the lungs every 4 (four) hours as needed for wheezing or shortness of breath (cough, shortness of breath or wheezing.). 01/17/14  Yes Roselee Culver, MD  Calcium Carb-Cholecalciferol (CALCIUM 600+D) 600-800 MG-UNIT TABS Take 1 tablet by mouth daily.   Yes Historical Provider, MD  docusate sodium (COLACE) 100 MG capsule Take 100 mg by mouth 2 (two) times daily. Reported on 04/09/2016   Yes Historical Provider, MD  donepezil (ARICEPT) 10 MG tablet Take 10 mg by mouth at bedtime.   Yes Historical Provider,  MD  fluticasone (FLONASE) 50 MCG/ACT nasal spray Place 1 spray into both nostrils daily.  07/27/12  Yes Historical Provider, MD  furosemide (LASIX) 40 MG tablet Take 1 tablet (40 mg total) by mouth daily. Patient taking differently: Take 40 mg by mouth 2 (two) times daily.  07/12/15  Yes Oswald Hillock, MD  Homeopathic Products (ARNICARE ARTHRITIS PO) Apply 1 application topically 4 (four) times daily  as needed (for stiffness/pain.).   Yes Historical Provider, MD  Imatinib Mesylate (GLEEVEC PO) Take 400 mg by mouth daily.    Yes Historical Provider, MD  losartan (COZAAR) 50 MG tablet Take 1 tablet (50 mg total) by mouth daily. Patient taking differently: Take 50 mg by mouth 2 (two) times daily.  07/12/15  Yes Oswald Hillock, MD  methocarbamol (ROBAXIN) 500 MG tablet Take 500 mg by mouth every 8 (eight) hours as needed for muscle spasms.    Yes Historical Provider, MD  mirabegron ER (MYRBETRIQ) 50 MG TB24 tablet Take 50 mg by mouth at bedtime.    Yes Historical Provider, MD  potassium chloride SA (K-DUR,KLOR-CON) 20 MEQ tablet Take 2 tablets (40 mEq total) by mouth daily. Patient taking differently: Take 40 mEq by mouth 2 (two) times daily.  07/12/15  Yes Oswald Hillock, MD  Spacer/Aero-Holding Dorise Bullion Use with MDI as directed 01/17/14  Yes Roselee Culver, MD  vitamin B-12 (CYANOCOBALAMIN) 1000 MCG tablet Take 1,000 mcg by mouth 2 (two) times daily.    Yes Historical Provider, MD  zinc oxide (BALMEX) 11.3 % CREA cream Apply 1 application topically 4 (four) times daily as needed (for barrier cream to buttocks area).   Yes Historical Provider, MD  ciprofloxacin (CIPRO) 250 MG tablet Take 1 tablet (250 mg total) by mouth 2 (two) times daily. Patient not taking: Reported on 07/28/2016 04/05/16   Darlyne Russian, MD   Hospital Meds . aspirin  325 mg Oral Daily  . aztreonam  1 g Intravenous Q8H  . hydrocortisone sod succinate (SOLU-CORTEF) inj  50 mg Intravenous Q6H  . [START ON 07/30/2016] levofloxacin (LEVAQUIN) IV  750 mg Intravenous Q48H   Family History  Family History  Problem Relation Age of Onset  . Hypertension Father     Social History  Social History   Social History  . Marital status: Widowed    Spouse name: N/A  . Number of children: N/A  . Years of education: N/A   Occupational History  . Not on file.   Social History Main Topics  . Smoking status: Never Smoker  .  Smokeless tobacco: Never Used  . Alcohol use No  . Drug use: No  . Sexual activity: Not on file   Other Topics Concern  . Not on file   Social History Narrative  . No narrative on file     Review of Systems General:  No chills, fever, night sweats or weight changes.  Cardiovascular:  No chest pain, dyspnea on exertion, edema, orthopnea, palpitations, paroxysmal nocturnal dyspnea. Dermatological: No rash, lesions/masses Respiratory: No cough, dyspnea Urologic: No hematuria, dysuria Abdominal:   No nausea, vomiting, diarrhea, bright red blood per rectum, melena, or hematemesis Neurologic:  No visual changes, wkns, changes in mental status. All other systems reviewed and are otherwise negative except as noted above.  Physical Exam  Blood pressure 134/63, pulse (!) 127, temperature 98.8 F (37.1 C), temperature source Core (Comment), resp. rate (!) 23, height 5\' 4"  (1.626 m), weight 212 lb 4.9 oz (96.3 kg),  SpO2 99 %.  General: Pleasant, NAD, elderly, dementia, hard of hearing. Psych: Normal affect. Neuro: Alert and oriented X 3. Moves all extremities spontaneously. HEENT: hard of hearing, hears best out of left ear Neck: Supple without bruits or JVD. Lungs:  Resp regular and unlabored, CTA. Heart: Irregularly irregular, normal rate no s3, s4, or murmurs. Abdomen: Soft, non-tender, non-distended, BS + x 4.  Extremities: no edema. Chronic venous stasis dermatitis bilaterally. caregiver reports that is her baseline. DP/PT/Radials 2+ and equal bilaterally.  Labs  Troponin (Point of Care Test) No results for input(s): TROPIPOC in the last 72 hours.  Recent Labs  07/29/16 0100 07/29/16 0634  TROPONINI 0.69* 0.53*   Lab Results  Component Value Date   WBC 24.9 (H) 07/29/2016   HGB 9.2 (L) 07/29/2016   HCT 26.8 (L) 07/29/2016   MCV 89.0 07/29/2016   PLT 44 (L) 07/29/2016     Recent Labs Lab 07/28/16 0737 07/29/16 0100  NA 128* 130*  K 3.9 4.4  CL 101 103  CO2 20*  18*  BUN 30* 36*  CREATININE 1.65* 1.72*  CALCIUM 7.1* 7.4*  PROT 5.2*  --   BILITOT 3.5*  --   ALKPHOS 112  --   ALT 164*  --   AST 172*  --   GLUCOSE 156* 110*   No results found for: CHOL, HDL, LDLCALC, TRIG No results found for: DDIMER   Radiology/Studies  Dg Chest Port 1 View  Result Date: 07/28/2016 CLINICAL DATA:  82 year old female with central line placement. EXAM: PORTABLE CHEST 1 VIEW COMPARISON:  Chest radiograph dated 07/27/2016 FINDINGS: There has been interval placement of a right IJ central line with tip over central SVC. No pneumothorax. The lungs are clear. The right costophrenic angle has been excluded from the image. There is widened appearance of the mediastinum, likely related to rotation and patient positioning. Repeat radiograph with better positioning of the patient may provide better evaluation of the mediastinum. There is cardiomegaly. There is atherosclerotic calcification of the thoracic aorta. There is degenerative changes of the spine and shoulders. Multiple surgical clips noted at the base of the neck on the right. IMPRESSION: Interval placement of a right IJ central line with tip over central SVC. No pneumothorax. Widened appearance of the mediastinum, likely positional. Repeat radiograph with better positioning of the patient may provide better evaluation of the mediastinal contour. Electronically Signed   By: Anner Crete M.D.   On: 07/28/2016 04:16   Dg Chest Port 1 View  Result Date: 07/28/2016 CLINICAL DATA:  Acute onset of altered mental status. Initial encounter. EXAM: PORTABLE CHEST 1 VIEW COMPARISON:  Chest radiograph performed 07/07/2015 FINDINGS: The lungs are well-aerated. There is elevation of the right hemidiaphragm. There is no evidence of focal opacification, pleural effusion or pneumothorax. The cardiomediastinal silhouette is enlarged. No acute osseous abnormalities are seen. Degenerative change is noted at both glenohumeral joints, worse  on the left. IMPRESSION: Elevation of the right hemidiaphragm. Lungs remain grossly clear. Cardiomegaly. Electronically Signed   By: Garald Balding M.D.   On: 07/28/2016 01:03   US Abdomen Limited Ruq  Result Date: 07/28/2016 CLINICAL DATA:  82 year old female with cholelithiasis. Altered mental status and evidence of septic shock. Initial encounter. EXAM: US ABDOMEN LIMITED - RIGHT UPPER QUADRANT COMPARISON:  Ultrasound 07/07/2015. FINDINGS: Gallbladder: Small echogenic stones in the gallbladder fundus individually measuring about 9 mm. Gallbladder wall thickness remains normal at 2 mm. No pericholecystic fluid. No sonographic Murphy sign elicited. Some superimposed gallbladder  sludge is suspected (image 20). Common bile duct: Diameter: 8 mm, previously 6-7 mm. Liver: No intrahepatic biliary ductal dilatation is evident. No discrete liver lesion. Hepatic echogenicity within normal limits. Other findings: Negative visible right kidney. IMPRESSION: 1. Upper limits of normal to mildly enlarged CBD, but no intrahepatic biliary ductal dilatation to strongly suggest acute biliary obstruction. Significance is doubtful in the absence of hyperbilirubinemia, but if the bilirubin is elevated then consider early acute biliary obstruction. 2. Chronic cholelithiasis, and probably also gallbladder sludge, without evidence of acute cholecystitis. Electronically Signed   By: [name] M.D.   On: 07/28/2016 08:54    ECG  Telemetry- Atrial fibrillation w/ CVR  Echocardiogram - pending   ASSESSMENT AND PLAN  1. Septic Shock: secondary to bacteriemia. Source felt to be UTI. Improving with IV antibiotics, levophed and IV fluids.   2. Atrial Fibrillation w/ RVR: likely driven by septic shock/ infection + anemia. Unable to tolerate BB/CCB due to hypotension, thus IV amiodarone was initiated for rate control. She remains in afib, however rate is improved in the 90s. She is asymptomatic. She is not a candidate for a/c given  chronic anemia/ thrombocytopenia. Continue to treat underlying illness and hopefully her arrhthymia will correct itself.   3. Elevated Troponin: 0.69>>0.53. She denies recent CP. In the setting of septic shock and rapid afib. Could be demand ischemia. Regardless, she is not a candidate for cath/PCI as she would not be able to be treated with antiplatelet therapy given thrombocytopenia. Platelets have dropped to 44K . Hold off on ASA for now. MD to further advise.     Signed, Lyda Jester, PA-C 07/29/2016, 10:29 AM   Attending Note:   The patient was seen and examined.  Agree with assessment and plan as noted above.  Changes made to the above note as needed. Discussed with daughter in law who was present in the room  Patient seen and independently examined with Lyda Jester, PA .   We discussed all aspects of the encounter. I agree with the assessment and plan as stated above.  1. Atrial fib:   Almost certainly due to her urosepsis. She has thrombocytopenia - I'm not sure that she is a good candidate for anticoagulation. HR is typically slow - in the 40s  Agree with amio for now.   Would not be very aggressive with this as she may develop bradycardia.  If she convertes, she will likely be bradycardic. Would accept a ventricular rate of 80-90  2. Elevated Troponin:   Likely due to her sepsis. She is bed / wheelchair bound and is a very poor candidate for invasive procedures.   I would not suggest cardiac cath.  She is not currently having any angina at present and I would  Suggest medical therapy and eventually comfort care if she does develop angina.  3. Weakness:   Has been bed bound for 5-6 years according to daughter in law.   4. Dementia:   Able to converse but clearly has some dementia.   Asks the same question over and over.        I have spent a total of 40 minutes with patient reviewing hospital  notes , telemetry, EKGs, labs and examining patient as well as  establishing an assessment and plan that was discussed with the patient. > 50% of time was spent in direct patient care.    Thayer Headings, Brooke Bonito., MD, [facility] 07/29/2016, 11:18 AM [address],  [address] Office -  [phone] Pager [phone]

## 2016-07-29 NOTE — Progress Notes (Signed)
Spoke with daughter-in-law this morning about patient's chronic foley use. Per daughter in law, patient usually has an 25 gauge urinary catheter with a 30 cc balloon. Updates given to CCM NP per NP remove current foley which is a 14 french 10cc balloon and replace with an 18 gauge 30 cc balloon.

## 2016-07-29 NOTE — Progress Notes (Signed)
Pharmacy Antibiotic Note  Melanie Trevino is a 82 y.o. female admitted on 07/27/2016 with sepsis.  Patient is allergic to amoxicillin.  Broad abx (vancomycin, aztronam, and levaquin) started on admission.  Today, 07/29/2016: -  afeb - wbc elevate but down -  scr up 1.72-- hydrating with Na bicarb (crcl~27) - last  LA elevated on 9/25 - bcx with  klebsiella pneumo (susceptibility pending). Vancomycin d/ced today. CCM recommends to de-escalate abx once sensitivity results are back  Plan: - continue levaquin 750 mg IV q48h - reduce aztronam to 1gm IV q8h - f/u culture, renal funct  ______________________________  Height: 5\' 4"  (162.6 cm) Weight: 212 lb 4.9 oz (96.3 kg) IBW/kg (Calculated) : 54.7  Temp (24hrs), Avg:99 F (37.2 C), Min:98.6 F (37 C), Max:99.5 F (37.5 C)   Recent Labs Lab 07/27/16 2333 07/27/16 2357 07/28/16 0256 07/28/16 0737 07/29/16 0100  WBC 10.5  --   --  27.3* 24.9*  CREATININE 1.68*  --   --  1.65* 1.72*  LATICACIDVEN  --  4.54* 4.33*  --   --     Estimated Creatinine Clearance: 26.9 mL/min (by C-G formula based on SCr of 1.72 mg/dL (H)).    Allergies  Allergen Reactions  . Shellfish Allergy Anaphylaxis  . Other Other (See Comments)    HORSE SERUMYDrug[Other] swelling6/09/2006 12:00:00 AM by Erin Hearing CPhT  . Amoxicillin Rash and Swelling    Antimicrobials this admission:  9/25 vanc >>  9/26 9/25 aztreonam >>   9/25 levofloxacin >>  Dose adjustments this admission:   n/a  Microbiology results:  9/25 BCx x2: klebsiella pneumo 9/25 UCx:  9/25 MRSA PCR: neg 9/25 rapid HIV (-)  Thank you for allowing pharmacy to be a part of this patient's care.  Lynelle Doctor 07/29/2016 9:50 AM

## 2016-07-30 DIAGNOSIS — I4891 Unspecified atrial fibrillation: Secondary | ICD-10-CM

## 2016-07-30 DIAGNOSIS — N183 Chronic kidney disease, stage 3 unspecified: Secondary | ICD-10-CM

## 2016-07-30 LAB — CBC
HCT: 26.5 % — ABNORMAL LOW (ref 36.0–46.0)
HCT: 26.5 % — ABNORMAL LOW (ref 36.0–46.0)
Hemoglobin: 9.5 g/dL — ABNORMAL LOW (ref 12.0–15.0)
Hemoglobin: 9.3 g/dL — ABNORMAL LOW (ref 12.0–15.0)
MCH: 31 pg (ref 26.0–34.0)
MCH: 30.5 pg (ref 26.0–34.0)
MCHC: 35.8 g/dL (ref 30.0–36.0)
MCHC: 35.1 g/dL (ref 30.0–36.0)
MCV: 86.6 fL (ref 78.0–100.0)
MCV: 86.9 fL (ref 78.0–100.0)
PLATELETS: 55 10*3/uL — AB (ref 150–400)
PLATELETS: 60 10*3/uL — AB (ref 150–400)
RBC: 3.06 MIL/uL — AB (ref 3.87–5.11)
RBC: 3.05 MIL/uL — AB (ref 3.87–5.11)
RDW: 15.9 % — ABNORMAL HIGH (ref 11.5–15.5)
RDW: 15.8 % — AB (ref 11.5–15.5)
WBC: 26.9 10*3/uL — ABNORMAL HIGH (ref 4.0–10.5)
WBC: 23.5 10*3/uL — AB (ref 4.0–10.5)

## 2016-07-30 LAB — COMPREHENSIVE METABOLIC PANEL
ALBUMIN: 2.1 g/dL — AB (ref 3.5–5.0)
ALK PHOS: 109 U/L (ref 38–126)
ALT: 109 U/L — AB (ref 14–54)
ALT: 98 U/L — ABNORMAL HIGH (ref 14–54)
AST: 64 U/L — ABNORMAL HIGH (ref 15–41)
AST: 51 U/L — ABNORMAL HIGH (ref 15–41)
Albumin: 2.4 g/dL — ABNORMAL LOW (ref 3.5–5.0)
Alkaline Phosphatase: 96 U/L (ref 38–126)
Anion gap: 8 (ref 5–15)
Anion gap: 7 (ref 5–15)
BILIRUBIN TOTAL: 1.4 mg/dL — AB (ref 0.3–1.2)
BUN: 48 mg/dL — ABNORMAL HIGH (ref 6–20)
BUN: 47 mg/dL — ABNORMAL HIGH (ref 6–20)
CALCIUM: 7.3 mg/dL — AB (ref 8.9–10.3)
CHLORIDE: 100 mmol/L — AB (ref 101–111)
CO2: 21 mmol/L — ABNORMAL LOW (ref 22–32)
CO2: 20 mmol/L — ABNORMAL LOW (ref 22–32)
CREATININE: 1.67 mg/dL — AB (ref 0.44–1.00)
CREATININE: 1.72 mg/dL — AB (ref 0.44–1.00)
Calcium: 7.2 mg/dL — ABNORMAL LOW (ref 8.9–10.3)
Chloride: 101 mmol/L (ref 101–111)
GFR, EST AFRICAN AMERICAN: 31 mL/min — AB (ref >60)
GFR, EST AFRICAN AMERICAN: 30 mL/min — AB (ref >60)
GFR, EST NON AFRICAN AMERICAN: 27 mL/min — AB (ref >60)
GFR, EST NON AFRICAN AMERICAN: 26 mL/min — AB (ref >60)
Glucose, Bld: 132 mg/dL — ABNORMAL HIGH (ref 65–99)
Glucose, Bld: 151 mg/dL — ABNORMAL HIGH (ref 65–99)
POTASSIUM: 3.3 mmol/L — AB (ref 3.5–5.1)
Potassium: 3.8 mmol/L (ref 3.5–5.1)
SODIUM: 128 mmol/L — AB (ref 135–145)
Sodium: 129 mmol/L — ABNORMAL LOW (ref 135–145)
TOTAL PROTEIN: 4.4 g/dL — AB (ref 6.5–8.1)
Total Bilirubin: 1.4 mg/dL — ABNORMAL HIGH (ref 0.3–1.2)
Total Protein: 4.8 g/dL — ABNORMAL LOW (ref 6.5–8.1)

## 2016-07-30 LAB — CULTURE, BLOOD (ROUTINE X 2)

## 2016-07-30 LAB — MAGNESIUM: Magnesium: 2.1 mg/dL (ref 1.7–2.4)

## 2016-07-30 LAB — URINE CULTURE: CULTURE: NO GROWTH

## 2016-07-30 MED ORDER — CIPROFLOXACIN HCL 500 MG PO TABS: 750.0000 mg | ORAL_TABLET | ORAL | Status: DC

## 2016-07-30 MED ORDER — CIPROFLOXACIN HCL 500 MG PO TABS: 500.0000 mg | ORAL_TABLET | ORAL | Status: DC

## 2016-07-30 MED ORDER — SODIUM CHLORIDE 0.9% FLUSH: 10.0000 mL | INTRAVENOUS | Status: DC | PRN

## 2016-07-30 MED ORDER — CIPROFLOXACIN HCL 500 MG PO TABS: 500.0000 mg | ORAL_TABLET | Freq: Two times a day (BID) | ORAL | Status: DC

## 2016-07-30 NOTE — Progress Notes (Signed)
PULMONARY / CRITICAL CARE MEDICINE   Name: Melanie Trevino MRN: [id number] DOB: Sep 25, 1928    ADMISSION DATE:  07/27/2016 CONSULTATION DATE:  07/28/16  REFERRING MD:  EDP  CHIEF COMPLAINT:  Altered mental status  SUBJECTIVE:  No distress.  Still very confused   VITAL SIGNS: BP (!) 83/51   Pulse 80   Temp 98.6 F (37 C) (Axillary)   Resp 19   Ht 5\' 4"  (1.626 m)   Wt 220 lb 0.3 oz (99.8 kg)   SpO2 99%   BMI 37.77 kg/m   HEMODYNAMICS: CVP:  [10 mmHg-12 mmHg] 11 mmHg  VENTILATOR SETTINGS:    INTAKE / OUTPUT:  Intake/Output Summary (Last 24 hours) at 07/30/16 1055 Last data filed at 07/30/16 0900  Gross per 24 hour  Intake          1420.13 ml  Output             2000 ml  Net          -579.87 ml     PHYSICAL EXAMINATION:  General Well nourished, well developed, obese, no apparent distress. Very confused   HEENT No gross abnormalities. Oropharynx clear. Very hard of hearing   Pulmonary Clear to auscultation bilaterally with no wheezes, rales or ronchi. Good effort, symmetrical expansion.  She does have occasional rhonchi that cl w cough   Cardiovascular Regular irreg. AF w/ CVR  Abdomen Soft, non-tender, non-distended, positive bowel sounds, no palpable organomegaly or masses. Umbilical hernia easily reduced.  Normoresonant to percussion.  Musculoskeletal Lower extremities with contractures at the knees. Otherwise grossly normal.   Lymphatics No cervical, supraclavicular or axillary adenopathy.   Neurologic Confused, repeats same questions. No focal deficits.   Skin/Integuement No rash, no cyanosis, no clubbing. Chronic venous stasis changes b/l legs. No edema.      LABS:  BMET  Recent Labs Lab 07/29/16 0422 07/29/16 1037 07/29/16 1700  NA 129* 129* 128*  K 3.8 3.5 3.6  CL 100* 104 99*  CO2 21* 18* 20*  BUN 48* 40* 42*  CREATININE 1.67* 1.71* 1.74*  GLUCOSE 132* 127* 188*    Electrolytes  Recent Labs Lab 07/28/16 0737 07/29/16 0100 07/29/16 0422  07/29/16 1037 07/29/16 1700  CALCIUM 7.1* 7.4* 7.3* 7.2* 7.1*  MG 1.4* 1.9 2.1  --   --   PHOS 2.5 2.9  --   --   --     CBC  Recent Labs Lab 07/29/16 0100 07/29/16 0422 07/30/16 1010  WBC 24.9* 26.9* 23.5*  HGB 9.2* 9.5* 9.3*  HCT 26.8* 26.5* 26.5*  PLT 44* 55* 60*    Coag's No results for input(s): APTT, INR in the last 168 hours.  Sepsis Markers  Recent Labs Lab 07/27/16 2357 07/28/16 0256  LATICACIDVEN 4.54* 4.33*    ABG  Recent Labs Lab 07/28/16 0612  PHART 7.341*  PCO2ART 36.7  PO2ART 120*    Liver Enzymes  Recent Labs Lab 07/27/16 2333 06/03/16 - [facility] 04/15/16 - [facility]  AST 194* 51 38  ALT 184* 36 103  ALKPHOS 208* 299 288  BILITOT 3.8* 0.9 4.1  ALBUMIN 3.2* 3.0 2.8  Lipase  72  Cardiac Enzymes  Recent Labs Lab 07/29/16 0100 07/29/16 0634 07/29/16 1405  TROPONINI 0.69* 0.53* 0.40*    Glucose  Recent Labs Lab 07/28/16 0743  GLUCAP 146*    Imaging No results found.  STUDIES:  RUQ [place]  9/25 >> Upper limits of normal to mildly enlarged CBD, but no intrahepatic biliary  ductal dilatation to strongly suggest acute biliary obstruction.  Chronic cholelithiasis, and probably also gallbladder sludge, without evidence of acute cholecystitis ECHO 9/26>>>The patient was in atrial fibrillation. Normal LV size with   moderate LV hypertrophy. EF 55%. Normal RV size and systolic   function. Mild biatrial enlargement. No significant valvular   abnormalities.  CULTURES: Urine culture 9/25 (not yet collected) >> Blood cultures 9/25 >> Klebsiella>>>(pan sens)  ANTIBIOTICS: Vancomycin 9/25 >>9/26 Levaquin 9/25 >> 9/27 Aztreonam 9/25 >> 9/27 cipro 9/27>>>  SIGNIFICANT EVENTS: CVC RIJ ( by EDP ) >>  LINES/TUBES: CVC 9/25 >> Foley 9/25 >>  DISCUSSION:  septic shock secondary to urinary vs biliary source (favor UT). She has trouble with frequent UTIs, chr indwelling foley -last changed by [facility] on 9/23  and family reports dark/cloudy urine.  She also has known cholelithiasis and her liver enzymes are markedly elevated compared to 7 weeks ago-->may element of shock liver. Blood growing Klebsiella (pan sens). Rate now controlled and off amio as well as pressors. For today we will cont abx, kvo IVFs, follow up chemistry, LFTs and CBC. Change to Cipro. She can go to tele today. Will have triad assume care.    ASSESSMENT / PLAN:  PULMONARY A: No acute issues P:   Supplemental O2 if needed to maintain sats > 92%  CARDIOVASCULAR A:  Septic shock-->resolved AF w/ RVR Elevated trop P:  Dc steroids  Cont oral amio-->likely stop soon Not candidate for anticoagulation d/t thrombocytopenia   RENAL A:   Acute kidney injury -> scr bumped a little now up to > 1.7 Known renal mass concerning for RCC Hyponatremia NAG metabolic acidosis  P:  KVO IVFs Serial chemistries  Dc 'd vanc Trend Cr Avoid nephrotoxins Pharmacy to dose antibiotics  GASTROINTESTINAL A:   Elevated liver enzymes-->slightly improved Known cholelithiasis  Nausea/vomiting-->resolved  P:   Trend enzymes Add diet   HEMATOLOGIC A:   Chronic anemia - stable Thrombocytopenia P:  Trend CBC Hold gleevec Added PAS  INFECTIOUS A:   Septic shock 2/2 urinary vs biliary source-->favor UT Klebsiella bacteremia (pan sensitive) Wbc rising but may be steroid induced P:   See above Change to Cipro. Complete 10d total rx   ENDOCRINE A:   No acute issues   P:   Follow CBG  NEUROLOGIC A:   Altered mental status - likely toxic/metabolic encephalopathy in setting of septic shock-->seemingly improved. Hearing loss is a large contributing factor here  Hx alzheimer's dementia Hx R facial nerve weakness  Hx acoustic neuroma (R) s/p resection P:   RASS goal: 0 High risk for delirium  Consider prn haldol if agitated   FAMILY  - Updates: daughter in law at bedside. The patient's 2 sons (one orthodontist) are her 55. Prior discussions have maintained  that she remain a FULL CODE.  - Inter-disciplinary family meet or Palliative Care meeting due by:  day 7     07/30/2016, 10:55 AM

## 2016-07-30 NOTE — Progress Notes (Signed)
Patient Name: Melanie Trevino Date of Encounter: 07/30/2016  Primary Cardiologist: Adora Fridge, MD   Hospital Problem List     Principal Problem:   Septic shock due to urinary tract infection [facility]) Active Problems:   Sepsis ([place])   CKD (chronic kidney disease), stage III   Atrial fibrillation with RVR (HCC)   Wheelchair bound   Thrombocytopenia (HCC)   Bradycardia     Subjective   Awake.  Lots of questions - often the same question.  No chest pain or sob.  No palpitations.  C/o general malaise.  Remains in Afib- rate-controlled on PO amio.  Inpatient Medications    . amiodarone  200 mg Oral BID  . aspirin  325 mg Oral Daily  . aztreonam  1 g Intravenous Q8H  . levofloxacin (LEVAQUIN) IV  750 mg Intravenous Q48H    Vital Signs    Vitals:   07/30/16 0730 07/30/16 0800 07/30/16 0900 07/30/16 1000  BP:  (!) 102/51 108/63 (!) 83/51  Pulse: 86 80 85 80  Resp: (!) 31 16 (!) 23 19  Temp:  98.6 F (37 C)    TempSrc:  Axillary    SpO2: 100% 100% 100% 99%  Weight:      Height:        Intake/Output Summary (Last 24 hours) at 07/30/16 1030 Last data filed at 07/30/16 0900  Gross per 24 hour  Intake          1424.88 ml  Output             2000 ml  Net          -575.12 ml   Filed Weights   07/28/16 0535 07/29/16 0500 07/30/16 0414  Weight: 202 lb 9.6 oz (91.9 kg) 212 lb 4.9 oz (96.3 kg) 220 lb 0.3 oz (99.8 kg)    Physical Exam   GEN: Well nourished, well developed, in no acute distress.  HEENT: Grossly normal.  Neck: Supple, no JVD, carotid bruits, or masses. Cardiac: IR, IR, no murmurs, rubs, or gallops. No clubbing, cyanosis, edema.  Radials/DP/PT 2+ and equal bilaterally.  Respiratory:  Respirations regular and unlabored, diminished breath sounds bilat. GI: Soft, nontender, umbilical hernia. BS + x 4. MS: no deformity or atrophy. Skin: warm and dry, no rash. Neuro:  Strength and sensation are intact. Psych: disoriented to time/situation.  Normal affect.  Labs      CBC  Recent Labs  07/27/16 2333  07/29/16 0100 07/29/16 0422  WBC 10.5  < > 24.9* 26.9*  NEUTROABS 9.8*  --   --   --   HGB 10.8*  < > 9.2* 9.5*  HCT 31.5*  < > 26.8* 26.5*  MCV 88.7  < > 89.0 86.6  PLT 69*  < > 44* 55*  < > = values in this interval not displayed. Basic Metabolic Panel  Recent Labs  07/28/16 0737 07/29/16 0100 07/29/16 0422 07/29/16 1037 07/29/16 1700  NA 128* 130* 129* 129* 128*  K 3.9 4.4 3.8 3.5 3.6  CL 101 103 100* 104 99*  CO2 20* 18* 21* 18* 20*  GLUCOSE 156* 110* 132* 127* 188*  BUN 30* 36* 48* 40* 42*  CREATININE 1.65* 1.72* 1.67* 1.71* 1.74*  CALCIUM 7.1* 7.4* 7.3* 7.2* 7.1*  MG 1.4* 1.9 2.1  --   --   PHOS 2.5 2.9  --   --   --    Liver Function Tests  Recent Labs  07/28/16 0737 07/29/16 0422  AST 172* 64*  ALT 164* 109*  ALKPHOS 112 109  BILITOT 3.5* 1.4*  PROT 5.2* 4.8*  ALBUMIN 2.8* 2.4*    Recent Labs  07/27/16 2333  LIPASE 72*   Cardiac Enzymes  Recent Labs  07/29/16 0100 07/29/16 0634 07/29/16 1405  TROPONINI 0.69* 0.53* 0.40*    Telemetry    Afib, 80's to 90's.  Radiology    No results found.  2D Echocardiogram 9.26.2017  Study Conclusions   - Left ventricle: The cavity size was normal. Wall thickness was   increased in a pattern of moderate LVH. Indeterminant diastolic   function (atrial fibrillation). The estimated ejection fraction   was 55%. Although no diagnostic regional wall motion abnormality   was identified, this possibility cannot be completely excluded on   the basis of this study. - Aortic valve: There was no stenosis. - Mitral valve: There was trivial regurgitation. - Left atrium: The atrium was mildly dilated. - Right ventricle: The cavity size was normal. Systolic function   was normal. - Right atrium: The atrium was mildly dilated. - Tricuspid valve: Peak RV-RA gradient (S): 25 mm Hg. - Pulmonary arteries: PA peak pressure: 33 mm Hg (S). - Systemic veins: IVC measured 2.2  cm with > 50% respirophasic   variation, suggesting RA pressure 8 mmHg.  Patient Profile     82 y.o female with dementia, also wheelchair bound and nonambulatory, with h/o a brain tumor resected 11/27/1993 at [facility] and has had breast cancer status post mastectomy, who presented 9/24 with AMS and hypotension. Admitted for septic shock, secondary to bacteremia. Source felt to be UTI. Hospital course further complicated by afib w/ RVR.   Assessment & Plan    1.  Urosepsis/Klebsiella pneumoniae bacteremia:  Afebrile.  WBC remain elevated.  Abx per IM.  2.  Afib RVR:  In setting of #1.  Asymptomatic.  Rate controlled on PO amio.  Poor anticoagulation candidate 2/2 thrombocytopenia.  Will d/c asa.  Echo showed nl EF, mildly dil LA/RA.  Hopefully will convert to sinus as clinical condition improves.  Will be @ risk for bradycardia, following conversion, thus will likely have to stop amio following conversion.    3.  Hypotension:  BP in 80's earlier.  OTW trending in low 100s.    4. CKD III: creat stable currently.  5.  Elevated troponin:  Mild elevation with relatively flat trend. .69  .53  .40.  No chest pain.  Likely demand ischemia in setting of #1/#3.  Echo showed nl EF.  Cont medical Rx.  Will stop ASA in setting of thrombocytopenia.  6.  Thrombocytopenia:  Plts down from last year. Will d/c asa.  Signed, Murray Hodgkins NP 07/30/2016, 10:30 AM   Attending Note:   The patient was seen and examined.  Agree with assessment and plan as noted above.  Changes made to the above note as needed.  Patient seen and independently examined with Ignacia Bayley, NP.   We discussed all aspects of the encounter. I agree with the assessment and plan as stated above.  Melanie Trevino is doing OK from a cardiac standpoint. HR is better - still around 100 Would contiue amio for rate control.  She is not a good candidate for anticoagulation due to thrombocytopenia She may convert to SR as her urosepsis  improves.     I have spent a total of 30 minutes with patient reviewing hospital  notes , telemetry, EKGs, labs and examining patient as well as establishing an  assessment and plan that was discussed with the patient. > 50% of time was spent in direct patient care.    Thayer Headings, Brooke Bonito., MD, [facility] 07/30/2016, 1:05 PM [address]  [place] Pager [phone]

## 2016-07-30 NOTE — Progress Notes (Signed)
Date:  July 30, 2016 Chart reviewed for concurrent status and case management needs. Will continue to follow the patient for changes and needs: off pressors and cardiac drips plan is to transfer to telemetry floor today. Discharge Planning: following for needs Velva Harman, BSN, [place], [place]

## 2016-07-31 DIAGNOSIS — A419 Sepsis, unspecified organism: Secondary | ICD-10-CM

## 2016-07-31 DIAGNOSIS — N184 Chronic kidney disease, stage 4 (severe): Secondary | ICD-10-CM

## 2016-07-31 DIAGNOSIS — R001 Bradycardia, unspecified: Secondary | ICD-10-CM

## 2016-07-31 LAB — COMPREHENSIVE METABOLIC PANEL
ALBUMIN: 2 g/dL — AB (ref 3.5–5.0)
ALK PHOS: 102 U/L (ref 38–126)
ALT: 79 U/L — AB (ref 14–54)
ANION GAP: 6 (ref 5–15)
AST: 42 U/L — ABNORMAL HIGH (ref 15–41)
BILIRUBIN TOTAL: 1.5 mg/dL — AB (ref 0.3–1.2)
BUN: 55 mg/dL — ABNORMAL HIGH (ref 6–20)
CALCIUM: 7.3 mg/dL — AB (ref 8.9–10.3)
CO2: 23 mmol/L (ref 22–32)
CREATININE: 1.77 mg/dL — AB (ref 0.44–1.00)
Chloride: 101 mmol/L (ref 101–111)
GFR calc Af Amer: 29 mL/min — ABNORMAL LOW (ref >60)
GFR calc non Af Amer: 25 mL/min — ABNORMAL LOW (ref >60)
GLUCOSE: 83 mg/dL (ref 65–99)
Potassium: 3.9 mmol/L (ref 3.5–5.1)
Sodium: 130 mmol/L — ABNORMAL LOW (ref 135–145)
TOTAL PROTEIN: 4.3 g/dL — AB (ref 6.5–8.1)

## 2016-07-31 LAB — CBC
HEMATOCRIT: 27.2 % — AB (ref 36.0–46.0)
HEMOGLOBIN: 9.6 g/dL — AB (ref 12.0–15.0)
MCH: 29.9 pg (ref 26.0–34.0)
MCHC: 35.3 g/dL (ref 30.0–36.0)
MCV: 84.7 fL (ref 78.0–100.0)
Platelets: 58 10*3/uL — ABNORMAL LOW (ref 150–400)
RBC: 3.21 MIL/uL — AB (ref 3.87–5.11)
RDW: 15.7 % — ABNORMAL HIGH (ref 11.5–15.5)
WBC: 14.4 10*3/uL — ABNORMAL HIGH (ref 4.0–10.5)

## 2016-07-31 MED ORDER — LOSARTAN POTASSIUM 25 MG PO TABS: 25.0000 mg | ORAL_TABLET | Freq: Every day | ORAL | 0 refills | Status: DC

## 2016-07-31 MED ORDER — CIPROFLOXACIN HCL 500 MG PO TABS: 500.0000 mg | ORAL_TABLET | ORAL | 0 refills | Status: DC

## 2016-07-31 MED ORDER — AMIODARONE HCL 200 MG PO TABS: 200.0000 mg | ORAL_TABLET | Freq: Every day | ORAL | 0 refills | Status: DC

## 2016-07-31 NOTE — Progress Notes (Signed)
PULMONARY / CRITICAL CARE MEDICINE   Name: Melanie Trevino MRN: [id number] DOB: May 30, 1928    ADMISSION DATE:  07/27/2016 CONSULTATION DATE:  07/28/16  REFERRING MD:  EDP  CHIEF COMPLAINT:  Altered mental status  SUBJECTIVE:  No distress.  Still very confused; but close to baseline    VITAL SIGNS: BP (!) 102/52 (BP Location: Left Arm)   Pulse 84   Temp 99.2 F (37.3 C) (Oral)   Resp (!) 24   Ht 5\' 4"  (1.626 m)   Wt 220 lb 0.3 oz (99.8 kg)   SpO2 98%   BMI 37.77 kg/m   INTAKE / OUTPUT:  Intake/Output Summary (Last 24 hours) at 07/31/16 1048 Last data filed at 07/31/16 1008  Gross per 24 hour  Intake              510 ml  Output             1975 ml  Net            -1465 ml     PHYSICAL EXAMINATION:  General Well nourished, well developed, obese, no apparent distress. Very confused but pleasant and close to baseline  HEENT No gross abnormalities. Oropharynx clear. Very hard of hearing   Pulmonary Clear to auscultation bilaterally with no wheezes, rales or ronchi. Good effort, symmetrical expansion.    Cardiovascular Regular irreg. AF w/ CVR  Abdomen Soft, non-tender, non-distended, positive bowel sounds, no palpable organomegaly or masses. Umbilical hernia easily reduced.  Normoresonant to percussion.  Musculoskeletal Lower extremities with contractures at the knees. Otherwise grossly normal.   Lymphatics No cervical, supraclavicular or axillary adenopathy.   Neurologic Confused, repeats same questions. No focal deficits.   Skin/Integuement No rash, no cyanosis, no clubbing. Chronic venous stasis changes b/l legs. No edema.      LABS:  BMET  Recent Labs Lab 07/29/16 1700 07/30/16 1010 07/31/16 0415  NA 128* 128* 130*  K 3.6 3.3* 3.9  CL 99* 101 101  CO2 20* 20* 23  BUN 42* 47* 55*  CREATININE 1.74* 1.72* 1.77*  GLUCOSE 188* 151* 83    Electrolytes  Recent Labs Lab 07/28/16 0737 07/29/16 0100 07/29/16 0422  07/29/16 1700 07/30/16 1010  07/31/16 0415  CALCIUM 7.1* 7.4* 7.3*  < > 7.1* 7.2* 7.3*  MG 1.4* 1.9 2.1  --   --   --   --   PHOS 2.5 2.9  --   --   --   --   --   < > = values in this interval not displayed.  CBC  Recent Labs Lab 07/29/16 0422 07/30/16 1010 07/31/16 0415  WBC 26.9* 23.5* 14.4*  HGB 9.5* 9.3* 9.6*  HCT 26.5* 26.5* 27.2*  PLT 55* 60* 58*    Coag's No results for input(s): APTT, INR in the last 168 hours.  Sepsis Markers  Recent Labs Lab 07/27/16 2357 07/28/16 0256  LATICACIDVEN 4.54* 4.33*    ABG  Recent Labs Lab 07/28/16 0612  PHART 7.341*  PCO2ART 36.7  PO2ART 120*    Liver Enzymes  Recent Labs Lab 07/27/16 2333 06/03/16 - [facility] 04/15/16 - [facility]  AST 194* 51 38  ALT 184* 36 103  ALKPHOS 208* 299 288  BILITOT 3.8* 0.9 4.1  ALBUMIN 3.2* 3.0 2.8  Lipase  72  Cardiac Enzymes  Recent Labs Lab 07/29/16 0100 07/29/16 0634 07/29/16 1405  TROPONINI 0.69* 0.53* 0.40*    Glucose  Recent Labs Lab 07/28/16 0743  GLUCAP 146*  Imaging No results found.  STUDIES:  RUQ [place]  9/25 >> Upper limits of normal to mildly enlarged CBD, but no intrahepatic biliary ductal dilatation to strongly suggest acute biliary obstruction.  Chronic cholelithiasis, and probably also gallbladder sludge, without evidence of acute cholecystitis ECHO 9/26>>>The patient was in atrial fibrillation. Normal LV size with   moderate LV hypertrophy. EF 55%. Normal RV size and systolic   function. Mild biatrial enlargement. No significant valvular   abnormalities.  CULTURES: Urine culture 9/25 (not yet collected) >> Blood cultures 9/25 >> Klebsiella>>>(pan sens)  ANTIBIOTICS: Vancomycin 9/25 >>9/26 Levaquin 9/25 >> 9/27 Aztreonam 9/25 >> 9/27 cipro 9/27>>>  SIGNIFICANT EVENTS: CVC RIJ ( by EDP ) >>  LINES/TUBES: CVC 9/25 >> Foley 9/25 >>     ASSESSMENT / PLAN: Resolved issues: Septic shock AF w/ RVR Elevated CEs/demand ischemia NAG metabolic acidosis  Acute  encephalopathy   DISCUSSION: Clinically looks better. Looks like major barriers or question to answer at time of DC are: long term plan for amio (when to stop??), rising creatinine and lastly when or IF she can safely resume Gleevac given her persistent thrombocytopenia. From a critical care stand-point she looks good and we will s/o.    Klebsiella bacteremia (pan sensitive): source unclear-->has chronic foley but UC was negative. Also has chronic cholecystitis so possible biliary source plan Changed to Cipro. Complete 10d total rx  AF  Elevated trop Plan:  Cont oral amio-->likely stop soon Not candidate for anticoagulation d/t thrombocytopenia   Acute kidney injury -> scr bumped a little now up to > 1.7 Known renal mass concerning for RCC Hyponatremia Plan:  KVO IVFs Serial chemistries  Dc 'd vanc Trend Cr Avoid nephrotoxins Pharmacy to dose antibiotics  Elevated liver enzymes-->slightly improved-->GI thinks shock liver Known cholelithiasis  Nausea/vomiting-->resolved  Plan:   Trend enzymes cont diet   Chronic anemia - stable Thrombocytopenia Plan:  Trend CBC Hold gleevec Added PAS  Hx alzheimer's dementia Hx R facial nerve weakness  Hx acoustic neuroma (R) s/p resection Plan:   RASS goal: 0 High risk for delirium    Erick Colace ACNP-BC [place] Pager # [phone] OR # [phone] if no answer   07/31/2016, 10:48 AM

## 2016-07-31 NOTE — Consult Note (Signed)
Referring Provider: Dr. Charlies Silvers Primary Care Physician:  Carlena Sax, MD Primary Gastroenterologist:  Dr. Cristina Gong  Reason for Consultation:  Elevated LFTs  HPI: Melanie Trevino is a 82 y.o. female with multiple medical problems including metastatic small intestinal GIST who was admitted for sepsis thought to be due to a UTI. Her liver enzymes were elevated on admit with TB 3.8, ALP 208, AST 194, ALT 184 (07/27/16). Liver enzymes has improved since then and today TB 1.5, ALP 102, AST 42, ALT 79. Lipase mildly elevated on admit at 72. Denies abdominal pain. Chart history of N/V/D prior to admit. She is unable to give me any history. Oriented X 3. U/S showed cholelithiasis without cholecystitis. On broad spectrum antibiotics.   Past Medical History:  Diagnosis Date  . Breast cancer ([place]) 08/18/2012  . Cancer ([place])   . Chronic indwelling Foley catheter 07/08/2015  . Dementia of the Alzheimer's type 07/08/2015  . 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 High cholesterol   . Hypertension   . Liver metastasis ([place]) 07/08/2015  . Renal mass 07/08/2015  . Wheelchair bound 07/08/2015    Past Surgical History:  Procedure Laterality Date  . MASTECTOMY Right     Prior to Admission medications   Medication Sig Start Date End Date Taking? Authorizing Provider  albuterol (PROVENTIL HFA;VENTOLIN HFA) 108 (90 BASE) MCG/ACT inhaler Inhale 2 puffs into the lungs every 4 (four) hours as needed for wheezing or shortness of breath (cough, shortness of breath or wheezing.). 01/17/14  Yes Roselee Culver, MD  Calcium Carb-Cholecalciferol (CALCIUM 600+D) 600-800 MG-UNIT TABS Take 1 tablet by mouth daily.   Yes Historical Provider, MD  docusate sodium (COLACE) 100 MG capsule Take 100 mg by mouth 2 (two) times daily. Reported on 04/09/2016   Yes Historical Provider, MD  donepezil (ARICEPT) 10 MG tablet Take 10 mg by mouth at bedtime.   Yes Historical Provider, MD  fluticasone (FLONASE) 50 MCG/ACT nasal spray Place 1  spray into both nostrils daily.  07/27/12  Yes Historical Provider, MD  furosemide (LASIX) 40 MG tablet Take 1 tablet (40 mg total) by mouth daily. Patient taking differently: Take 40 mg by mouth 2 (two) times daily.  07/12/15  Yes Oswald Hillock, MD  Homeopathic Products (ARNICARE ARTHRITIS PO) Apply 1 application topically 4 (four) times daily as needed (for stiffness/pain.).   Yes Historical Provider, MD  Imatinib Mesylate (GLEEVEC PO) Take 400 mg by mouth daily.    Yes Historical Provider, MD  losartan (COZAAR) 50 MG tablet Take 1 tablet (50 mg total) by mouth daily. Patient taking differently: Take 50 mg by mouth 2 (two) times daily.  07/12/15  Yes Oswald Hillock, MD  methocarbamol (ROBAXIN) 500 MG tablet Take 500 mg by mouth every 8 (eight) hours as needed for muscle spasms.    Yes Historical Provider, MD  mirabegron ER (MYRBETRIQ) 50 MG TB24 tablet Take 50 mg by mouth at bedtime.    Yes Historical Provider, MD  potassium chloride SA (K-DUR,KLOR-CON) 20 MEQ tablet Take 2 tablets (40 mEq total) by mouth daily. Patient taking differently: Take 40 mEq by mouth 2 (two) times daily.  07/12/15  Yes Oswald Hillock, MD  Spacer/Aero-Holding Dorise Bullion Use with MDI as directed 01/17/14  Yes Roselee Culver, MD  vitamin B-12 (CYANOCOBALAMIN) 1000 MCG tablet Take 1,000 mcg by mouth 2 (two) times daily.    Yes Historical Provider, MD  zinc oxide (BALMEX) 11.3 % CREA cream Apply 1 application topically 4 (  four) times daily as needed (for barrier cream to buttocks area).   Yes Historical Provider, MD  ciprofloxacin (CIPRO) 250 MG tablet Take 1 tablet (250 mg total) by mouth 2 (two) times daily. Patient not taking: Reported on 07/28/2016 04/05/16   Darlyne Russian, MD    Scheduled Meds: . amiodarone  200 mg Oral BID  . [START ON 08/01/2016] ciprofloxacin  500 mg Oral Q24H   Continuous Infusions:  PRN Meds:.sodium chloride, sodium chloride flush  Allergies as of 07/27/2016 - Review Complete 07/27/2016  Allergen  Reaction Noted  . Shellfish allergy Anaphylaxis 01/17/2014  . Other Other (See Comments) 07/07/2015  . Amoxicillin Rash and Swelling 07/07/2015    Family History  Problem Relation Age of Onset  . Hypertension Father     Social History   Social History  . Marital status: Widowed    Spouse name: N/A  . Number of children: N/A  . Years of education: N/A   Occupational History  . Not on file.   Social History Main Topics  . Smoking status: Never Smoker  . Smokeless tobacco: Never Used  . Alcohol use No  . Drug use: No  . Sexual activity: Not on file   Other Topics Concern  . Not on file   Social History Narrative  . No narrative on file    Review of Systems: All negative except as stated above in HPI.  Physical Exam: Vital signs: Vitals:   07/30/16 2242 07/31/16 0426  BP: (!) 84/55 (!) 102/52  Pulse: 88 84  Resp: (!) 22 (!) 24  Temp: 99.7 F (37.6 C) 99.2 F (37.3 C)   Last BM Date: 07/29/16 General:   Elderly, lethargic, Well-developed, well-nourished, pleasant and cooperative in NAD HEENT: anicteric sclera Neck: supple, nontender Lungs:  Clear throughout to auscultation.   No wheezes, crackles, or rhonchi. No acute distress. Heart:  Regular rate and rhythm; no murmurs, clicks, rubs,  or gallops. Abdomen: soft, nontender, nondistended, +BS  Rectal:  Deferred Ext: no edema  GI:  Lab Results:  Recent Labs  07/29/16 0422 07/30/16 1010 07/31/16 0415  WBC 26.9* 23.5* 14.4*  HGB 9.5* 9.3* 9.6*  HCT 26.5* 26.5* 27.2*  PLT 55* 60* 58*   BMET  Recent Labs  07/29/16 1700 07/30/16 1010 07/31/16 0415  NA 128* 128* 130*  K 3.6 3.3* 3.9  CL 99* 101 101  CO2 20* 20* 23  GLUCOSE 188* 151* 83  BUN 42* 47* 55*  CREATININE 1.74* 1.72* 1.77*  CALCIUM 7.1* 7.2* 7.3*   LFT  Recent Labs  07/31/16 0415  PROT 4.3*  ALBUMIN 2.0*  AST 42*  ALT 79*  ALKPHOS 102  BILITOT 1.5*   PT/INR No results for input(s): LABPROT, INR in the last 72  hours.   Studies/Results: No results found.  Impression/Plan: Elevated liver enzymes in the setting of septic shock and presence of gallstones. I think her elevated liver enzymes were due to low flow state to the liver during her illness and not from her gallstones. I do not think she has a CBD stone and would recommend conservative treatment of her gallstones. Her LFTs are normalizing. F/U Dr. Cristina Gong in October to decide whether an MRCP is still needed but would not recommend at this time. Will sign off. Call if questions.    LOS: 3 days   [place] C.  07/31/2016, 8:55 AM  Pager [phone]  If no answer or after 5 PM call [phone]

## 2016-07-31 NOTE — Discharge Instructions (Signed)
Amiodarone tablets °What is this medicine? °AMIODARONE (a MEE oh da rone) is an antiarrhythmic drug. It helps make your heart beat regularly. Because of the side effects caused by this medicine, it is only used when other medicines have not worked. It is usually used for heartbeat problems that may be life threatening. °This medicine may be used for other purposes; ask your health care provider or pharmacist if you have questions. °What should I tell my health care provider before I take this medicine? °They need to know if you have any of these conditions: °-liver disease °-lung disease °-other heart problems °-thyroid disease °-an unusual or allergic reaction to amiodarone, iodine, other medicines, foods, dyes, or preservatives °-pregnant or trying to get pregnant °-breast-feeding °How should I use this medicine? °Take this medicine by mouth with a glass of water. Follow the directions on the prescription label. You can take this medicine with or without food. However, you should always take it the same way each time. Take your doses at regular intervals. Do not take your medicine more often than directed. Do not stop taking except on the advice of your doctor or health care professional. °A special MedGuide will be given to you by the pharmacist with each prescription and refill. Be sure to read this information carefully each time. °Talk to your pediatrician regarding the use of this medicine in children. Special care may be needed. °Overdosage: If you think you have taken too much of this medicine contact a poison control center or emergency room at once. °NOTE: This medicine is only for you. Do not share this medicine with others. °What if I miss a dose? °If you miss a dose, take it as soon as you can. If it is almost time for your next dose, take only that dose. Do not take double or extra doses. °What may interact with this medicine? °Do not take this medicine with any of the following  medications: °-abarelix °-apomorphine °-arsenic trioxide °-certain antibiotics like erythromycin, gemifloxacin, levofloxacin, pentamidine °-certain medicines for depression like amoxapine, tricyclic antidepressants °-certain medicines for fungal infections like fluconazole, itraconazole, ketoconazole, posaconazole, voriconazole °-certain medicines for irregular heart beat like disopyramide, dofetilide, dronedarone, ibutilide, propafenone, sotalol °-certain medicines for malaria like chloroquine, halofantrine °-cisapride °-droperidol °-haloperidol °-hawthorn °-maprotiline °-methadone °-phenothiazines like chlorpromazine, mesoridazine, thioridazine °-pimozide °-ranolazine °-red yeast rice °-vardenafil °-ziprasidone °This medicine may also interact with the following medications: °-antiviral medicines for HIV or AIDS °-certain medicines for blood pressure, heart disease, irregular heart beat °-certain medicines for cholesterol like atorvastatin, cerivastatin, lovastatin, simvastatin °-certain medicines for hepatitis C like sofosbuvir and ledipasvir; sofosbuvir °-certain medicines for seizures like phenytoin °-certain medicines for thyroid problems °-certain medicines that treat or prevent blood clots like warfarin °-cholestyramine °-cimetidine °-clopidogrel °-cyclosporine °-dextromethorphan °-diuretics °-fentanyl °-general anesthetics °-grapefruit juice °-lidocaine °-loratadine °-methotrexate °-other medicines that prolong the QT interval (cause an abnormal heart rhythm) °-procainamide °-quinidine °-rifabutin, rifampin, or rifapentine °-St. John's Wort °-trazodone °This list may not describe all possible interactions. Give your health care provider a list of all the medicines, herbs, non-prescription drugs, or dietary supplements you use. Also tell them if you smoke, drink alcohol, or use illegal drugs. Some items may interact with your medicine. °What should I watch for while using this medicine? °Your condition will  be monitored closely when you first begin therapy. Often, this drug is first started in a hospital or other monitored health care setting. Once you are on maintenance therapy, visit your doctor or health care professional for regular   checks on your progress. Because your condition and use of this medicine carry some risk, it is a good idea to carry an identification card, necklace or bracelet with details of your condition, medications, and doctor or health care professional. °You may get drowsy or dizzy. Do not drive, use machinery, or do anything that needs mental alertness until you know how this medicine affects you. Do not stand or sit up quickly, especially if you are an older patient. This reduces the risk of dizzy or fainting spells. °This medicine can make you more sensitive to the sun. Keep out of the sun. If you cannot avoid being in the sun, wear protective clothing and use sunscreen. Do not use sun lamps or tanning beds/booths. °You should have regular eye exams before and during treatment. Call your doctor if you have blurred vision, see halos, or your eyes become sensitive to light. Your eyes may get dry. It may be helpful to use a lubricating eye solution or artificial tears solution. °If you are going to have surgery or a procedure that requires contrast dyes, tell your doctor or health care professional that you are taking this medicine. °What side effects may I notice from receiving this medicine? °Side effects that you should report to your doctor or health care professional as soon as possible: °-allergic reactions like skin rash, itching or hives, swelling of the face, lips, or tongue °-blue-gray coloring of the skin °-blurred vision, seeing blue green halos, increased sensitivity of the eyes to light °-breathing problems °-chest pain °-dark urine °-fast, irregular heartbeat °-feeling faint or light-headed °-intolerance to heat or cold °-nausea or vomiting °-pain and swelling of the  scrotum °-pain, tingling, numbness in feet, hands °-redness, blistering, peeling or loosening of the skin, including inside the mouth °-spitting up blood °-stomach pain °-sweating °-unusual or uncontrolled movements of body °-unusually weak or tired °-weight gain or loss °-yellowing of the eyes or skin °Side effects that usually do not require medical attention (report to your doctor or health care professional if they continue or are bothersome): °-change in sex drive or performance °-constipation °-dizziness °-headache °-loss of appetite °-trouble sleeping °This list may not describe all possible side effects. Call your doctor for medical advice about side effects. You may report side effects to FDA at 1-800-FDA-1088. °Where should I keep my medicine? °Keep out of the reach of children. °Store at room temperature between 20 and 25 degrees C (68 and 77 degrees F). Protect from light. Keep container tightly closed. Throw away any unused medicine after the expiration date. °NOTE: This sheet is a summary. It may not cover all possible information. If you have questions about this medicine, talk to your doctor, pharmacist, or health care provider. °  °© 2016, [employer]. (2014-01-23 19:48:11) ° °

## 2016-07-31 NOTE — Discharge Summary (Addendum)
Physician Discharge Summary  BLESSIN GEERTS [id number] DOB: November 13, 1927 DOA: 07/27/2016  PCP: Carlena Sax, MD  Admit date: 07/27/2016 Discharge date: 07/31/2016  Recommendations for Outpatient Follow-up:  Please continue to hold Lasix until kidney function closer to normal limit. Creatinine is 1.7 prior to discharge May resume losartan but we've reduced dose down to 25 mg a day Cipro for 10 days on discharge   Discharge Diagnoses:  Principal Problem:   Septic shock due to urinary tract infection ([place]) Active Problems:   Sepsis ([place])   Wheelchair bound   Thrombocytopenia ([place])   Bradycardia   CKD (chronic kidney disease), stage III   Atrial fibrillation with RVR ([place])    Discharge Condition: stable; Patient's daughter-in-law at the bedside. Patient wants to go home today and her daughter in law says that today would be a better today for discharge tomorrow. I spoke with cardiology who confirmed there is no plan for cardioversion. Patient came for discharge from GI standpoint, cardiology standpoint. Patient was also seen by critical care who has signed off at this time.  Diet recommendation: as tolerated   History of present illness:  82 year old female with past medical history of dementia. She presented with septic shock secondary to urinary tract infection and possibly biliary source. She was under critical care medicine care through 07/30/2016 this patient required pressor support. [facility] assumed care from 07/31/2016. Patient was treated with ciprofloxacin. She has been seen by GI in consultation as well. Cardiology has seen the patient for atrial fibrillation. Patient is not a candidate for anticoagulation because of thrombocytopenia. For that reason she cannot be cardioverted. Per cardiology patient will continue amiodarone 200 mg daily. She needs to follow-up in their office on discharge.  Hospital Course:   Principal Problem:   Septic shock due to urinary tract infection (HCC) /  leukocytosis - Resolved at this point - Patient will continue ciprofloxacin for 10 days on discharge - Blood cell count improving  Active Problems:   Essential hypertension - May continue low-dose losartan 25 mg daily    Chronic kidney disease stage IV - Baseline creatinine 1.12 about 3 months ago - Creatinine on this admission elevated up to 1.6 - 1.7, likely due to septic shock - Outpatient follow-up with repeat Cr  - Lasix on hold until creatinine improves    Thrombocytopenia (HCC) - Likely secondary to history of breast and liver cancer - No reports of bleeding - Patient's daughter-in-law of air or flow platelet count, this will be monitored on an outpatient basis    Atrial fibrillation with RVR ([place]) - CHADS vasc score 3 - Not on AC due to thrombocytopenia      Dementia without behavioral disturbance  - Stable    Signed:  Leisa Lenz, MD  [facility] 07/31/2016, 11:38 AM  Pager #: [phone]  Time spent in minutes: more than 30 minutes    Discharge Exam: Vitals:   07/30/16 2242 07/31/16 0426  BP: (!) 84/55 (!) 102/52  Pulse: 88 84  Resp: (!) 22 (!) 24  Temp: 99.7 F (37.6 C) 99.2 F (37.3 C)   Vitals:   07/30/16 1213 07/30/16 1342 07/30/16 2242 07/31/16 0426  BP: (!) 87/47 (!) 95/57 (!) 84/55 (!) 102/52  Pulse: 70 86 88 84  Resp: (!) 22 (!) 22 (!) 22 (!) 24  Temp: 99.2 F (37.3 C) 98.7 F (37.1 C) 99.7 F (37.6 C) 99.2 F (37.3 C)  TempSrc: Oral Oral Oral Oral  SpO2: 98% 99% 95% 98%  Weight:      Height:        General: Pt is alert, follows commands appropriately, not in acute distress Cardiovascular: irregular rhythm, S1/S2 + Respiratory: Clear to auscultation bilaterally, no wheezing, no crackles, no rhonchi Abdominal: Soft, non tender, non distended, bowel sounds +, no guarding Extremities: no cyanosis, pulses palpable bilaterally DP and PT Neuro: Grossly nonfocal  Discharge Instructions  Discharge Instructions    Call MD  for:  difficulty breathing, headache or visual disturbances    Complete by:  As directed    Call MD for:  persistant nausea and vomiting    Complete by:  As directed    Call MD for:  redness, tenderness, or signs of infection (pain, swelling, redness, odor or green/yellow discharge around incision site)    Complete by:  As directed    Call MD for:  severe uncontrolled pain    Complete by:  As directed    Diet - low sodium heart healthy    Complete by:  As directed    Discharge instructions    Complete by:  As directed     Please continue to hold Lasix until kidney function closer to normal limit. Creatinine is 1.7 prior to discharge May resume losartan but we've reduced dose down to 25 mg a day   Increase activity slowly    Complete by:  As directed        Medication List    STOP taking these medications   furosemide 40 MG tablet Commonly known as:  LASIX   potassium chloride SA 20 MEQ tablet Commonly known as:  K-DUR,KLOR-CON     TAKE these medications   albuterol 108 (90 Base) MCG/ACT inhaler Commonly known as:  PROVENTIL HFA;VENTOLIN HFA Inhale 2 puffs into the lungs every 4 (four) hours as needed for wheezing or shortness of breath (cough, shortness of breath or wheezing.).   ARNICARE ARTHRITIS PO Apply 1 application topically 4 (four) times daily as needed (for stiffness/pain.).   CALCIUM 600+D 600-800 MG-UNIT Tabs Generic drug:  Calcium Carb-Cholecalciferol Take 1 tablet by mouth daily.   ciprofloxacin 500 MG tablet Commonly known as:  CIPRO Take 1 tablet (500 mg total) by mouth daily. Start taking on:  08/01/2016 What changed:  medication strength  how much to take  when to take this   docusate sodium 100 MG capsule Commonly known as:  COLACE Take 100 mg by mouth 2 (two) times daily. Reported on 04/09/2016   donepezil 10 MG tablet Commonly known as:  ARICEPT Take 10 mg by mouth at bedtime.   fluticasone 50 MCG/ACT nasal spray Commonly known as:   FLONASE Place 1 spray into both nostrils daily.   GLEEVEC PO Take 400 mg by mouth daily.   losartan 25 MG tablet Commonly known as:  COZAAR Take 1 tablet (25 mg total) by mouth daily. What changed:  medication strength  how much to take   methocarbamol 500 MG tablet Commonly known as:  ROBAXIN Take 500 mg by mouth every 8 (eight) hours as needed for muscle spasms.   mirabegron ER 50 MG Tb24 tablet Commonly known as:  MYRBETRIQ Take 50 mg by mouth at bedtime.   Spacer/Aero-Holding Dorise Bullion Use with MDI as directed   vitamin B-12 1000 MCG tablet Commonly known as:  CYANOCOBALAMIN Take 1,000 mcg by mouth 2 (two) times daily.   zinc oxide 11.3 % Crea cream Commonly known as:  BALMEX Apply 1 application topically 4 (four) times daily as needed (  for barrier cream to buttocks area).       Follow-up Information    Carlena Sax, MD. Schedule an appointment as soon as possible for a visit in 1 week(s).   Specialty:  Internal Medicine Contact information: [id number] [place] [address] [phone]            The results of significant diagnostics from this hospitalization (including imaging, microbiology, ancillary and laboratory) are listed below for reference.    Significant Diagnostic Studies: Dg Chest Port 1 View  Result Date: 07/28/2016 CLINICAL DATA:  82 year old female with central line placement. EXAM: PORTABLE CHEST 1 VIEW COMPARISON:  Chest radiograph dated 07/27/2016 FINDINGS: There has been interval placement of a right IJ central line with tip over central SVC. No pneumothorax. The lungs are clear. The right costophrenic angle has been excluded from the image. There is widened appearance of the mediastinum, likely related to rotation and patient positioning. Repeat radiograph with better positioning of the patient may provide better evaluation of the mediastinum. There is cardiomegaly. There is atherosclerotic calcification of the  thoracic aorta. There is degenerative changes of the spine and shoulders. Multiple surgical clips noted at the base of the neck on the right. IMPRESSION: Interval placement of a right IJ central line with tip over central SVC. No pneumothorax. Widened appearance of the mediastinum, likely positional. Repeat radiograph with better positioning of the patient may provide better evaluation of the mediastinal contour. Electronically Signed   By: Anner Crete M.D.   On: 07/28/2016 04:16   Dg Chest Port 1 View  Result Date: 07/28/2016 CLINICAL DATA:  Acute onset of altered mental status. Initial encounter. EXAM: PORTABLE CHEST 1 VIEW COMPARISON:  Chest radiograph performed 07/07/2015 FINDINGS: The lungs are well-aerated. There is elevation of the right hemidiaphragm. There is no evidence of focal opacification, pleural effusion or pneumothorax. The cardiomediastinal silhouette is enlarged. No acute osseous abnormalities are seen. Degenerative change is noted at both glenohumeral joints, worse on the left. IMPRESSION: Elevation of the right hemidiaphragm. Lungs remain grossly clear. Cardiomegaly. Electronically Signed   By: Garald Balding M.D.   On: 07/28/2016 01:03   US Abdomen Limited Ruq  Result Date: 07/28/2016 CLINICAL DATA:  82 year old female with cholelithiasis. Altered mental status and evidence of septic shock. Initial encounter. EXAM: US ABDOMEN LIMITED - RIGHT UPPER QUADRANT COMPARISON:  Ultrasound 07/07/2015. FINDINGS: Gallbladder: Small echogenic stones in the gallbladder fundus individually measuring about 9 mm. Gallbladder wall thickness remains normal at 2 mm. No pericholecystic fluid. No sonographic Murphy sign elicited. Some superimposed gallbladder sludge is suspected (image 20). Common bile duct: Diameter: 8 mm, previously 6-7 mm. Liver: No intrahepatic biliary ductal dilatation is evident. No discrete liver lesion. Hepatic echogenicity within normal limits. Other findings: Negative visible  right kidney. IMPRESSION: 1. Upper limits of normal to mildly enlarged CBD, but no intrahepatic biliary ductal dilatation to strongly suggest acute biliary obstruction. Significance is doubtful in the absence of hyperbilirubinemia, but if the bilirubin is elevated then consider early acute biliary obstruction. 2. Chronic cholelithiasis, and probably also gallbladder sludge, without evidence of acute cholecystitis. Electronically Signed   By: [name] M.D.   On: 07/28/2016 08:54    Microbiology: Recent Results (from the past 240 hour(s))  Culture, blood (Routine x 2)     Status: Abnormal   Collection Time: 07/27/16 11:30 PM  Result Value Ref Range Status   Specimen Description BLOOD LEFT ANTECUBITAL  Final   Special Requests BOTTLES DRAWN  AEROBIC AND ANAEROBIC 6ML EA  Final   Culture  Setup Time   Final    GRAM NEGATIVE RODS IN BOTH AEROBIC AND ANAEROBIC BOTTLES CRITICAL RESULT CALLED TO, READ BACK BY AND VERIFIED WITH: Sheffield Slider, PHARMD ([place]) AT 2205 ON 07/28/16 BY C. JESSUP, MLT. Performed at [facility] (A)  Final   Report Status 07/30/2016 FINAL  Final   Organism ID, Bacteria KLEBSIELLA PNEUMONIAE  Final      Susceptibility   Klebsiella pneumoniae - MIC*    AMPICILLIN >=32 RESISTANT Resistant     CEFAZOLIN <=4 SENSITIVE Sensitive     CEFEPIME <=1 SENSITIVE Sensitive     CEFTAZIDIME <=1 SENSITIVE Sensitive     CEFTRIAXONE <=1 SENSITIVE Sensitive     CIPROFLOXACIN <=0.25 SENSITIVE Sensitive     GENTAMICIN <=1 SENSITIVE Sensitive     IMIPENEM <=0.25 SENSITIVE Sensitive     TRIMETH/SULFA <=20 SENSITIVE Sensitive     AMPICILLIN/SULBACTAM 8 SENSITIVE Sensitive     PIP/TAZO <=4 SENSITIVE Sensitive     Extended ESBL NEGATIVE Sensitive     * KLEBSIELLA PNEUMONIAE  Blood Culture ID Panel (Reflexed)     Status: Abnormal   Collection Time: 07/27/16 11:30 PM  Result Value Ref Range Status   Enterococcus species NOT DETECTED NOT DETECTED Final    Listeria monocytogenes NOT DETECTED NOT DETECTED Final   Staphylococcus species NOT DETECTED NOT DETECTED Final   Staphylococcus aureus NOT DETECTED NOT DETECTED Final   Streptococcus species NOT DETECTED NOT DETECTED Final   Streptococcus agalactiae NOT DETECTED NOT DETECTED Final   Streptococcus pneumoniae NOT DETECTED NOT DETECTED Final   Streptococcus pyogenes NOT DETECTED NOT DETECTED Final   Acinetobacter baumannii NOT DETECTED NOT DETECTED Final   Enterobacteriaceae species DETECTED (A) NOT DETECTED Final    Comment: CRITICAL RESULT CALLED TO, READ BACK BY AND VERIFIED WITH: Sheffield Slider, PHARMD ([place]) AT 2205 ON 07/28/16 BY C. JESSUP, MLT.    Enterobacter cloacae complex NOT DETECTED NOT DETECTED Final   Escherichia coli NOT DETECTED NOT DETECTED Final   Klebsiella oxytoca NOT DETECTED NOT DETECTED Final   Klebsiella pneumoniae DETECTED (A) NOT DETECTED Final    Comment: CRITICAL RESULT CALLED TO, READ BACK BY AND VERIFIED WITH: Sheffield Slider, PHARMD ([place]) AT 2205 ON 07/28/16 BY C. JESSUP, MLT.    Proteus species NOT DETECTED NOT DETECTED Final   Serratia marcescens NOT DETECTED NOT DETECTED Final   Carbapenem resistance NOT DETECTED NOT DETECTED Final   Haemophilus influenzae NOT DETECTED NOT DETECTED Final   Neisseria meningitidis NOT DETECTED NOT DETECTED Final   Pseudomonas aeruginosa NOT DETECTED NOT DETECTED Final   Candida albicans NOT DETECTED NOT DETECTED Final   Candida glabrata NOT DETECTED NOT DETECTED Final   Candida krusei NOT DETECTED NOT DETECTED Final   Candida parapsilosis NOT DETECTED NOT DETECTED Final   Candida tropicalis NOT DETECTED NOT DETECTED Final    Comment: Performed at [facility]  Culture, blood (Routine x 2)     Status: Abnormal   Collection Time: 07/27/16 11:33 PM  Result Value Ref Range Status   Specimen Description BLOOD BLOOD LEFT HAND  Final   Special Requests IN PEDIATRIC BOTTLE 1ML  Final   Culture  Setup Time   Final    GRAM  NEGATIVE RODS AEROBIC BOTTLE ONLY CRITICAL RESULT CALLED TO, READ BACK BY AND VERIFIED WITH: Sheffield Slider, PHARMD ([place]) AT 2205 ON 07/28/16 BY C. JESSUP, MLT.    Culture (  A)  Final    KLEBSIELLA PNEUMONIAE SUSCEPTIBILITIES PERFORMED ON PREVIOUS CULTURE WITHIN THE LAST 5 DAYS. Performed at [facility]    Report Status 07/30/2016 FINAL  Final  Urine culture     Status: None   Collection Time: 07/28/16  4:27 AM  Result Value Ref Range Status   Specimen Description URINE, CATHETERIZED  Final   Special Requests NONE  Final   Culture NO GROWTH Performed at [facility]   Final   Report Status 07/30/2016 FINAL  Final  MRSA PCR Screening     Status: None   Collection Time: 07/28/16  6:08 AM  Result Value Ref Range Status   MRSA by PCR NEGATIVE NEGATIVE Final     Labs: Basic Metabolic Panel:  Recent Labs Lab 07/28/16 0737 07/29/16 0100 07/29/16 0422 07/29/16 1037 07/29/16 1700 07/30/16 1010 07/31/16 0415  NA 128* 130* 129* 129* 128* 128* 130*  K 3.9 4.4 3.8 3.5 3.6 3.3* 3.9  CL 101 103 100* 104 99* 101 101  CO2 20* 18* 21* 18* 20* 20* 23  GLUCOSE 156* 110* 132* 127* 188* 151* 83  BUN 30* 36* 48* 40* 42* 47* 55*  CREATININE 1.65* 1.72* 1.67* 1.71* 1.74* 1.72* 1.77*  CALCIUM 7.1* 7.4* 7.3* 7.2* 7.1* 7.2* 7.3*  MG 1.4* 1.9 2.1  --   --   --   --   PHOS 2.5 2.9  --   --   --   --   --    Liver Function Tests:  Recent Labs Lab 07/27/16 2333 07/28/16 0737 07/29/16 0422 07/30/16 1010 07/31/16 0415  AST 194* 172* 64* 51* 42*  ALT 184* 164* 109* 98* 79*  ALKPHOS 208* 112 109 96 102  BILITOT 3.8* 3.5* 1.4* 1.4* 1.5*  PROT 5.7* 5.2* 4.8* 4.4* 4.3*  ALBUMIN 3.2* 2.8* 2.4* 2.1* 2.0*    Recent Labs Lab 07/27/16 2333  LIPASE 72*   No results for input(s): AMMONIA in the last 168 hours. CBC:  Recent Labs Lab 07/27/16 2333 07/28/16 0737 07/29/16 0100 07/29/16 0422 07/30/16 1010 07/31/16 0415  WBC 10.5 27.3* 24.9* 26.9* 23.5* 14.4*  NEUTROABS 9.8*   --   --   --   --   --   HGB 10.8* 9.5* 9.2* 9.5* 9.3* 9.6*  HCT 31.5* 27.9* 26.8* 26.5* 26.5* 27.2*  MCV 88.7 90.6 89.0 86.6 86.9 84.7  PLT 69* 63* 44* 55* 60* 58*   Cardiac Enzymes:  Recent Labs Lab 07/29/16 0100 07/29/16 0634 07/29/16 1405  TROPONINI 0.69* 0.53* 0.40*   BNP: BNP (last 3 results) No results for input(s): BNP in the last 8760 hours.  ProBNP (last 3 results) No results for input(s): PROBNP in the last 8760 hours.  CBG:  Recent Labs Lab 07/28/16 0743  GLUCAP 146*

## 2016-07-31 NOTE — Progress Notes (Signed)
Patient Name: Melanie Trevino Date of Encounter: 07/31/2016  Primary Cardiologist: Adora Fridge, MD   Hospital Problem List     Principal Problem:   Septic shock due to urinary tract infection [facility]) Active Problems:   Sepsis ([place])   Wheelchair bound   Thrombocytopenia ([place])   Bradycardia   CKD (chronic kidney disease), stage III   Atrial fibrillation with RVR (HCC)     Subjective   Awake.  Lots of questions - often the same question.  No chest pain or sob.  No palpitations.  Anxious for discharge, she says her grand daughter is getting married this weekend in [place].   Inpatient Medications    . amiodarone  200 mg Oral BID  . [START ON 08/01/2016] ciprofloxacin  500 mg Oral Q24H    Vital Signs    Vitals:   07/30/16 1213 07/30/16 1342 07/30/16 2242 07/31/16 0426  BP: (!) 87/47 (!) 95/57 (!) 84/55 (!) 102/52  Pulse: 70 86 88 84  Resp: (!) 22 (!) 22 (!) 22 (!) 24  Temp: 99.2 F (37.3 C) 98.7 F (37.1 C) 99.7 F (37.6 C) 99.2 F (37.3 C)  TempSrc: Oral Oral Oral Oral  SpO2: 98% 99% 95% 98%  Weight:      Height:        Intake/Output Summary (Last 24 hours) at 07/31/16 0852 Last data filed at 07/31/16 0429  Gross per 24 hour  Intake              380 ml  Output             1550 ml  Net            -1170 ml   Filed Weights   07/28/16 0535 07/29/16 0500 07/30/16 0414  Weight: 202 lb 9.6 oz (91.9 kg) 212 lb 4.9 oz (96.3 kg) 220 lb 0.3 oz (99.8 kg)    Physical Exam   GEN: Obese female, in no acute distress.  HEENT: Grossly normal.  Neck: Supple, no JVD, carotid bruits, or masses. Cardiac: IR, IR, no murmurs, rubs, or gallops. No clubbing, cyanosis, edema.  Radials/DP/PT 2+ and equal bilaterally.  Respiratory:  Respirations regular and unlabored, diminished breath sounds bilat. GI: Soft, nontender, umbilical hernia. BS + x 4. MS: no deformity or atrophy. Skin: warm and dry, no rash. Neuro:  Strength and sensation are intact. Psych: She knew she was in the  hospital. Normal affect.  Labs    CBC  Recent Labs  07/30/16 1010 07/31/16 0415  WBC 23.5* 14.4*  HGB 9.3* 9.6*  HCT 26.5* 27.2*  MCV 86.9 84.7  PLT 60* 58*   Basic Metabolic Panel  Recent Labs  07/29/16 0100 07/29/16 0422  07/30/16 1010 07/31/16 0415  NA 130* 129*  < > 128* 130*  K 4.4 3.8  < > 3.3* 3.9  CL 103 100*  < > 101 101  CO2 18* 21*  < > 20* 23  GLUCOSE 110* 132*  < > 151* 83  BUN 36* 48*  < > 47* 55*  CREATININE 1.72* 1.67*  < > 1.72* 1.77*  CALCIUM 7.4* 7.3*  < > 7.2* 7.3*  MG 1.9 2.1  --   --   --   PHOS 2.9  --   --   --   --   < > = values in this interval not displayed. Liver Function Tests  Recent Labs  07/30/16 1010 07/31/16 0415  AST 51* 42*  ALT 98* 79*  ALKPHOS 96 102  BILITOT 1.4* 1.5*  PROT 4.4* 4.3*  ALBUMIN 2.1* 2.0*   No results for input(s): LIPASE, AMYLASE in the last 72 hours. Cardiac Enzymes  Recent Labs  07/29/16 0100 07/29/16 0634 07/29/16 1405  TROPONINI 0.69* 0.53* 0.40*    Telemetry    Afib, 80's.  Radiology    [facility] 07/28/16 IMPRESSION: Interval placement of a right IJ central line with tip over central SVC. No pneumothorax.  Widened appearance of the mediastinum, likely positional. Repeat radiograph with better positioning of the patient may provide better evaluation of the mediastinal contour.  2D Echocardiogram 9.26.2017  Study Conclusions   - Left ventricle: The cavity size was normal. Wall thickness was   increased in a pattern of moderate LVH. Indeterminant diastolic   function (atrial fibrillation). The estimated ejection fraction   was 55%. Although no diagnostic regional wall motion abnormality   was identified, this possibility cannot be completely excluded on   the basis of this study. - Aortic valve: There was no stenosis. - Mitral valve: There was trivial regurgitation. - Left atrium: The atrium was mildly dilated. - Right ventricle: The cavity size was normal. Systolic function    was normal. - Right atrium: The atrium was mildly dilated. - Tricuspid valve: Peak RV-RA gradient (S): 25 mm Hg. - Pulmonary arteries: PA peak pressure: 33 mm Hg (S). - Systemic veins: IVC measured 2.2 cm with > 50% respirophasic   variation, suggesting RA pressure 8 mmHg.  Patient Profile     82 y.o female with dementia, also wheelchair bound and nonambulatory, with h/o a brain tumor resected 11/27/1993 at [facility] and has had breast cancer status post mastectomy, who presented 9/24 with AMS and hypotension. Admitted for septic shock, secondary to bacteremia. Source felt to be UTI. Hospital course further complicated by afib w/ RVR.   Assessment & Plan    1.  Urosepsis/Klebsiella pneumoniae bacteremia:  Afebrile.  WBC remains elevated but coming down.  Abx per IM.  2.  Afib RVR:  In setting of #1.  Asymptomatic.  Rate controlled on PO amio.  Poor anticoagulation candidate 2/2 thrombocytopenia.  ASA stopped. She may convert to sinus as clinical condition improves.  Will DC the amiodarone .   Because of her hx of bradycardia, I would not start her on beta blocker or Diltiazem .    3.  Hypotension:  BP in 80's earlier.  OTW trending in low 100s.    4. CKD III: creat stable currently.  5.  Elevated troponin:  Mild elevation with relatively flat trend. .69  .53  .40.  No chest pain.  Likely demand ischemia in setting of #1/#3.  Echo showed nl EF.  Cont medical Rx.  Will stop ASA in setting of thrombocytopenia.  Given her other medical issues, she is a poor candidate for invasive procedures.   Will continue to follow   6.  Thrombocytopenia:  Plts down from last year. She is now off ASA.   7 dementia:       Melanie Moores, MD  07/31/2016 12:16 PM    Melanie Trevino,  [address], [place]  [address] Pager [phone] Phone: [phone]; Fax: [phone]

## 2016-08-04 ENCOUNTER — Other Ambulatory Visit (HOSPITAL_COMMUNITY): Payer: Self-pay | Admitting: Internal Medicine

## 2016-08-04 DIAGNOSIS — K8050 Calculus of bile duct without cholangitis or cholecystitis without obstruction: Secondary | ICD-10-CM

## 2016-08-04 DIAGNOSIS — N289 Disorder of kidney and ureter, unspecified: Secondary | ICD-10-CM | POA: Diagnosis not present

## 2016-08-04 DIAGNOSIS — R945 Abnormal results of liver function studies: Secondary | ICD-10-CM | POA: Diagnosis not present

## 2016-08-04 DIAGNOSIS — N2889 Other specified disorders of kidney and ureter: Secondary | ICD-10-CM | POA: Diagnosis not present

## 2016-08-05 ENCOUNTER — Ambulatory Visit (HOSPITAL_COMMUNITY)
Admission: RE | Admit: 2016-08-05 | Discharge: 2016-08-05 | Disposition: A | Discharge status: Home / Self Care | Payer: Medicare Other | Source: Ambulatory Visit | Attending: Internal Medicine | Admitting: Internal Medicine

## 2016-08-05 ENCOUNTER — Other Ambulatory Visit (HOSPITAL_COMMUNITY): Payer: Self-pay | Admitting: Internal Medicine

## 2016-08-05 ENCOUNTER — Inpatient Hospital Stay (HOSPITAL_COMMUNITY)
Admission: RE | Admit: 2016-08-05 | Discharge: 2016-08-05 | Disposition: A | Discharge status: Home / Self Care | Payer: Medicare Other | Source: Ambulatory Visit

## 2016-08-05 DIAGNOSIS — M4186 Other forms of scoliosis, lumbar region: Secondary | ICD-10-CM | POA: Diagnosis not present

## 2016-08-05 DIAGNOSIS — I517 Cardiomegaly: Secondary | ICD-10-CM | POA: Diagnosis not present

## 2016-08-05 DIAGNOSIS — M12851 Other specific arthropathies, not elsewhere classified, right hip: Secondary | ICD-10-CM | POA: Insufficient documentation

## 2016-08-05 DIAGNOSIS — K439 Ventral hernia without obstruction or gangrene: Secondary | ICD-10-CM | POA: Diagnosis not present

## 2016-08-05 DIAGNOSIS — M47816 Spondylosis without myelopathy or radiculopathy, lumbar region: Secondary | ICD-10-CM | POA: Insufficient documentation

## 2016-08-05 DIAGNOSIS — K8050 Calculus of bile duct without cholangitis or cholecystitis without obstruction: Secondary | ICD-10-CM | POA: Diagnosis not present

## 2016-08-05 DIAGNOSIS — R935 Abnormal findings on diagnostic imaging of other abdominal regions, including retroperitoneum: Secondary | ICD-10-CM | POA: Diagnosis not present

## 2016-08-05 DIAGNOSIS — K862 Cyst of pancreas: Secondary | ICD-10-CM | POA: Diagnosis not present

## 2016-08-05 DIAGNOSIS — R600 Localized edema: Secondary | ICD-10-CM | POA: Diagnosis not present

## 2016-08-05 DIAGNOSIS — K8070 Calculus of gallbladder and bile duct without cholecystitis without obstruction: Secondary | ICD-10-CM | POA: Insufficient documentation

## 2016-08-05 DIAGNOSIS — R188 Other ascites: Secondary | ICD-10-CM | POA: Diagnosis not present

## 2016-08-05 DIAGNOSIS — M5136 Other intervertebral disc degeneration, lumbar region: Secondary | ICD-10-CM | POA: Insufficient documentation

## 2016-08-05 DIAGNOSIS — K8021 Calculus of gallbladder without cholecystitis with obstruction: Secondary | ICD-10-CM

## 2016-08-06 ENCOUNTER — Other Ambulatory Visit (HOSPITAL_COMMUNITY): Payer: Self-pay | Admitting: Internal Medicine

## 2016-08-06 DIAGNOSIS — K8021 Calculus of gallbladder without cholecystitis with obstruction: Secondary | ICD-10-CM

## 2016-08-07 DIAGNOSIS — A419 Sepsis, unspecified organism: Secondary | ICD-10-CM | POA: Diagnosis not present

## 2016-08-07 DIAGNOSIS — L8990 Pressure ulcer of unspecified site, unspecified stage: Secondary | ICD-10-CM | POA: Diagnosis not present

## 2016-08-07 DIAGNOSIS — N179 Acute kidney failure, unspecified: Secondary | ICD-10-CM | POA: Diagnosis not present

## 2016-08-07 DIAGNOSIS — D696 Thrombocytopenia, unspecified: Secondary | ICD-10-CM | POA: Diagnosis not present

## 2016-08-07 DIAGNOSIS — N390 Urinary tract infection, site not specified: Secondary | ICD-10-CM | POA: Diagnosis not present

## 2016-08-07 DIAGNOSIS — R600 Localized edema: Secondary | ICD-10-CM | POA: Diagnosis not present

## 2016-08-07 DIAGNOSIS — K8050 Calculus of bile duct without cholangitis or cholecystitis without obstruction: Secondary | ICD-10-CM | POA: Diagnosis not present

## 2016-08-08 DIAGNOSIS — N184 Chronic kidney disease, stage 4 (severe): Secondary | ICD-10-CM | POA: Diagnosis not present

## 2016-08-08 DIAGNOSIS — I4891 Unspecified atrial fibrillation: Secondary | ICD-10-CM | POA: Diagnosis not present

## 2016-08-08 DIAGNOSIS — K8020 Calculus of gallbladder without cholecystitis without obstruction: Secondary | ICD-10-CM | POA: Diagnosis not present

## 2016-08-08 DIAGNOSIS — Z466 Encounter for fitting and adjustment of urinary device: Secondary | ICD-10-CM | POA: Diagnosis not present

## 2016-08-08 DIAGNOSIS — I129 Hypertensive chronic kidney disease with stage 1 through stage 4 chronic kidney disease, or unspecified chronic kidney disease: Secondary | ICD-10-CM | POA: Diagnosis not present

## 2016-08-08 DIAGNOSIS — N390 Urinary tract infection, site not specified: Secondary | ICD-10-CM | POA: Diagnosis not present

## 2016-08-12 DIAGNOSIS — I129 Hypertensive chronic kidney disease with stage 1 through stage 4 chronic kidney disease, or unspecified chronic kidney disease: Secondary | ICD-10-CM | POA: Diagnosis not present

## 2016-08-12 DIAGNOSIS — K8020 Calculus of gallbladder without cholecystitis without obstruction: Secondary | ICD-10-CM | POA: Diagnosis not present

## 2016-08-12 DIAGNOSIS — I4891 Unspecified atrial fibrillation: Secondary | ICD-10-CM | POA: Diagnosis not present

## 2016-08-12 DIAGNOSIS — N390 Urinary tract infection, site not specified: Secondary | ICD-10-CM | POA: Diagnosis not present

## 2016-08-12 DIAGNOSIS — N184 Chronic kidney disease, stage 4 (severe): Secondary | ICD-10-CM | POA: Diagnosis not present

## 2016-08-12 DIAGNOSIS — Z466 Encounter for fitting and adjustment of urinary device: Secondary | ICD-10-CM | POA: Diagnosis not present

## 2016-08-15 ENCOUNTER — Other Ambulatory Visit: Payer: No Typology Code available for payment source

## 2016-08-15 DIAGNOSIS — I440 Atrioventricular block, first degree: Secondary | ICD-10-CM | POA: Diagnosis not present

## 2016-08-15 DIAGNOSIS — R001 Bradycardia, unspecified: Secondary | ICD-10-CM | POA: Diagnosis not present

## 2016-08-15 DIAGNOSIS — Z91013 Allergy to seafood: Secondary | ICD-10-CM | POA: Diagnosis not present

## 2016-08-15 DIAGNOSIS — Z01818 Encounter for other preprocedural examination: Secondary | ICD-10-CM | POA: Diagnosis not present

## 2016-08-15 DIAGNOSIS — Z87440 Personal history of urinary (tract) infections: Secondary | ICD-10-CM | POA: Diagnosis not present

## 2016-08-15 DIAGNOSIS — Z881 Allergy status to other antibiotic agents status: Secondary | ICD-10-CM | POA: Diagnosis not present

## 2016-08-15 DIAGNOSIS — Z85828 Personal history of other malignant neoplasm of skin: Secondary | ICD-10-CM | POA: Diagnosis not present

## 2016-08-15 DIAGNOSIS — Z6836 Body mass index (BMI) 36.0-36.9, adult: Secondary | ICD-10-CM | POA: Diagnosis not present

## 2016-08-15 DIAGNOSIS — G309 Alzheimer's disease, unspecified: Secondary | ICD-10-CM | POA: Diagnosis not present

## 2016-08-15 DIAGNOSIS — Z887 Allergy status to serum and vaccine status: Secondary | ICD-10-CM | POA: Diagnosis not present

## 2016-08-15 DIAGNOSIS — N3281 Overactive bladder: Secondary | ICD-10-CM | POA: Diagnosis not present

## 2016-08-15 DIAGNOSIS — Z85068 Personal history of other malignant neoplasm of small intestine: Secondary | ICD-10-CM | POA: Diagnosis not present

## 2016-08-15 DIAGNOSIS — R413 Other amnesia: Secondary | ICD-10-CM | POA: Diagnosis not present

## 2016-08-15 DIAGNOSIS — Z9109 Other allergy status, other than to drugs and biological substances: Secondary | ICD-10-CM | POA: Diagnosis not present

## 2016-08-15 DIAGNOSIS — I10 Essential (primary) hypertension: Secondary | ICD-10-CM | POA: Diagnosis not present

## 2016-08-15 DIAGNOSIS — I4891 Unspecified atrial fibrillation: Secondary | ICD-10-CM | POA: Diagnosis not present

## 2016-08-15 DIAGNOSIS — Z853 Personal history of malignant neoplasm of breast: Secondary | ICD-10-CM | POA: Diagnosis not present

## 2016-08-15 DIAGNOSIS — F0280 Dementia in other diseases classified elsewhere without behavioral disturbance: Secondary | ICD-10-CM | POA: Diagnosis not present

## 2016-08-15 DIAGNOSIS — M1990 Unspecified osteoarthritis, unspecified site: Secondary | ICD-10-CM | POA: Diagnosis not present

## 2016-08-15 DIAGNOSIS — G510 Bell's palsy: Secondary | ICD-10-CM | POA: Diagnosis not present

## 2016-08-15 DIAGNOSIS — R011 Cardiac murmur, unspecified: Secondary | ICD-10-CM | POA: Diagnosis not present

## 2016-08-18 DIAGNOSIS — R001 Bradycardia, unspecified: Secondary | ICD-10-CM | POA: Diagnosis not present

## 2016-08-18 DIAGNOSIS — I440 Atrioventricular block, first degree: Secondary | ICD-10-CM | POA: Diagnosis not present

## 2016-08-21 DIAGNOSIS — N3281 Overactive bladder: Secondary | ICD-10-CM | POA: Diagnosis not present

## 2016-08-21 DIAGNOSIS — E669 Obesity, unspecified: Secondary | ICD-10-CM | POA: Diagnosis not present

## 2016-08-21 DIAGNOSIS — G4733 Obstructive sleep apnea (adult) (pediatric): Secondary | ICD-10-CM | POA: Diagnosis not present

## 2016-08-21 DIAGNOSIS — F0390 Unspecified dementia without behavioral disturbance: Secondary | ICD-10-CM | POA: Diagnosis not present

## 2016-08-21 DIAGNOSIS — D649 Anemia, unspecified: Secondary | ICD-10-CM | POA: Diagnosis not present

## 2016-08-21 DIAGNOSIS — I10 Essential (primary) hypertension: Secondary | ICD-10-CM | POA: Diagnosis not present

## 2016-08-21 DIAGNOSIS — C787 Secondary malignant neoplasm of liver and intrahepatic bile duct: Secondary | ICD-10-CM | POA: Diagnosis not present

## 2016-08-21 DIAGNOSIS — R001 Bradycardia, unspecified: Secondary | ICD-10-CM | POA: Diagnosis not present

## 2016-08-21 DIAGNOSIS — K8050 Calculus of bile duct without cholangitis or cholecystitis without obstruction: Secondary | ICD-10-CM | POA: Diagnosis not present

## 2016-08-21 DIAGNOSIS — K831 Obstruction of bile duct: Secondary | ICD-10-CM | POA: Diagnosis not present

## 2016-08-21 DIAGNOSIS — Z853 Personal history of malignant neoplasm of breast: Secondary | ICD-10-CM | POA: Diagnosis not present

## 2016-08-21 DIAGNOSIS — I872 Venous insufficiency (chronic) (peripheral): Secondary | ICD-10-CM | POA: Diagnosis not present

## 2016-08-21 DIAGNOSIS — M170 Bilateral primary osteoarthritis of knee: Secondary | ICD-10-CM | POA: Diagnosis not present

## 2016-08-21 DIAGNOSIS — Z85831 Personal history of malignant neoplasm of soft tissue: Secondary | ICD-10-CM | POA: Diagnosis not present

## 2016-08-21 DIAGNOSIS — K5710 Diverticulosis of small intestine without perforation or abscess without bleeding: Secondary | ICD-10-CM | POA: Diagnosis not present

## 2016-08-21 DIAGNOSIS — Z87440 Personal history of urinary (tract) infections: Secondary | ICD-10-CM | POA: Diagnosis not present

## 2016-08-21 DIAGNOSIS — N2889 Other specified disorders of kidney and ureter: Secondary | ICD-10-CM | POA: Diagnosis not present

## 2016-08-21 DIAGNOSIS — M1611 Unilateral primary osteoarthritis, right hip: Secondary | ICD-10-CM | POA: Diagnosis not present

## 2016-09-04 DIAGNOSIS — N184 Chronic kidney disease, stage 4 (severe): Secondary | ICD-10-CM | POA: Diagnosis not present

## 2016-09-04 DIAGNOSIS — Z466 Encounter for fitting and adjustment of urinary device: Secondary | ICD-10-CM | POA: Diagnosis not present

## 2016-09-04 DIAGNOSIS — I129 Hypertensive chronic kidney disease with stage 1 through stage 4 chronic kidney disease, or unspecified chronic kidney disease: Secondary | ICD-10-CM | POA: Diagnosis not present

## 2016-09-04 DIAGNOSIS — I4891 Unspecified atrial fibrillation: Secondary | ICD-10-CM | POA: Diagnosis not present

## 2016-09-04 DIAGNOSIS — N390 Urinary tract infection, site not specified: Secondary | ICD-10-CM | POA: Diagnosis not present

## 2016-09-04 DIAGNOSIS — K8020 Calculus of gallbladder without cholecystitis without obstruction: Secondary | ICD-10-CM | POA: Diagnosis not present

## 2016-09-08 DIAGNOSIS — I4891 Unspecified atrial fibrillation: Secondary | ICD-10-CM | POA: Diagnosis not present

## 2016-09-08 DIAGNOSIS — Z466 Encounter for fitting and adjustment of urinary device: Secondary | ICD-10-CM | POA: Diagnosis not present

## 2016-09-08 DIAGNOSIS — I129 Hypertensive chronic kidney disease with stage 1 through stage 4 chronic kidney disease, or unspecified chronic kidney disease: Secondary | ICD-10-CM | POA: Diagnosis not present

## 2016-09-08 DIAGNOSIS — K8020 Calculus of gallbladder without cholecystitis without obstruction: Secondary | ICD-10-CM | POA: Diagnosis not present

## 2016-09-08 DIAGNOSIS — N390 Urinary tract infection, site not specified: Secondary | ICD-10-CM | POA: Diagnosis not present

## 2016-09-08 DIAGNOSIS — N184 Chronic kidney disease, stage 4 (severe): Secondary | ICD-10-CM | POA: Diagnosis not present

## 2016-09-16 DIAGNOSIS — C49A3 Gastrointestinal stromal tumor of small intestine: Secondary | ICD-10-CM | POA: Diagnosis not present

## 2016-09-16 DIAGNOSIS — Z853 Personal history of malignant neoplasm of breast: Secondary | ICD-10-CM | POA: Diagnosis not present

## 2016-09-16 DIAGNOSIS — F0390 Unspecified dementia without behavioral disturbance: Secondary | ICD-10-CM | POA: Diagnosis not present

## 2016-09-16 DIAGNOSIS — Z5111 Encounter for antineoplastic chemotherapy: Secondary | ICD-10-CM | POA: Diagnosis not present

## 2016-09-16 DIAGNOSIS — N2889 Other specified disorders of kidney and ureter: Secondary | ICD-10-CM | POA: Diagnosis not present

## 2016-09-18 DIAGNOSIS — Z466 Encounter for fitting and adjustment of urinary device: Secondary | ICD-10-CM | POA: Diagnosis not present

## 2016-09-18 DIAGNOSIS — K8020 Calculus of gallbladder without cholecystitis without obstruction: Secondary | ICD-10-CM | POA: Diagnosis not present

## 2016-09-18 DIAGNOSIS — N184 Chronic kidney disease, stage 4 (severe): Secondary | ICD-10-CM | POA: Diagnosis not present

## 2016-09-18 DIAGNOSIS — I129 Hypertensive chronic kidney disease with stage 1 through stage 4 chronic kidney disease, or unspecified chronic kidney disease: Secondary | ICD-10-CM | POA: Diagnosis not present

## 2016-09-18 DIAGNOSIS — N390 Urinary tract infection, site not specified: Secondary | ICD-10-CM | POA: Diagnosis not present

## 2016-09-18 DIAGNOSIS — I4891 Unspecified atrial fibrillation: Secondary | ICD-10-CM | POA: Diagnosis not present

## 2016-09-19 DIAGNOSIS — I4891 Unspecified atrial fibrillation: Secondary | ICD-10-CM | POA: Diagnosis not present

## 2016-09-19 DIAGNOSIS — N390 Urinary tract infection, site not specified: Secondary | ICD-10-CM | POA: Diagnosis not present

## 2016-09-19 DIAGNOSIS — K8020 Calculus of gallbladder without cholecystitis without obstruction: Secondary | ICD-10-CM | POA: Diagnosis not present

## 2016-09-19 DIAGNOSIS — N184 Chronic kidney disease, stage 4 (severe): Secondary | ICD-10-CM | POA: Diagnosis not present

## 2016-09-19 DIAGNOSIS — Z466 Encounter for fitting and adjustment of urinary device: Secondary | ICD-10-CM | POA: Diagnosis not present

## 2016-09-19 DIAGNOSIS — I129 Hypertensive chronic kidney disease with stage 1 through stage 4 chronic kidney disease, or unspecified chronic kidney disease: Secondary | ICD-10-CM | POA: Diagnosis not present

## 2016-10-02 DIAGNOSIS — N184 Chronic kidney disease, stage 4 (severe): Secondary | ICD-10-CM | POA: Diagnosis not present

## 2016-10-02 DIAGNOSIS — Z466 Encounter for fitting and adjustment of urinary device: Secondary | ICD-10-CM | POA: Diagnosis not present

## 2016-10-02 DIAGNOSIS — I129 Hypertensive chronic kidney disease with stage 1 through stage 4 chronic kidney disease, or unspecified chronic kidney disease: Secondary | ICD-10-CM | POA: Diagnosis not present

## 2016-10-02 DIAGNOSIS — I4891 Unspecified atrial fibrillation: Secondary | ICD-10-CM | POA: Diagnosis not present

## 2016-10-02 DIAGNOSIS — K8020 Calculus of gallbladder without cholecystitis without obstruction: Secondary | ICD-10-CM | POA: Diagnosis not present

## 2016-10-02 DIAGNOSIS — N390 Urinary tract infection, site not specified: Secondary | ICD-10-CM | POA: Diagnosis not present

## 2016-10-03 DIAGNOSIS — H16211 Exposure keratoconjunctivitis, right eye: Secondary | ICD-10-CM | POA: Diagnosis not present

## 2016-10-03 DIAGNOSIS — G510 Bell's palsy: Secondary | ICD-10-CM | POA: Diagnosis not present

## 2016-10-07 DIAGNOSIS — K8020 Calculus of gallbladder without cholecystitis without obstruction: Secondary | ICD-10-CM | POA: Diagnosis not present

## 2016-10-07 DIAGNOSIS — R32 Unspecified urinary incontinence: Secondary | ICD-10-CM | POA: Diagnosis not present

## 2016-10-07 DIAGNOSIS — I129 Hypertensive chronic kidney disease with stage 1 through stage 4 chronic kidney disease, or unspecified chronic kidney disease: Secondary | ICD-10-CM | POA: Diagnosis not present

## 2016-10-07 DIAGNOSIS — N184 Chronic kidney disease, stage 4 (severe): Secondary | ICD-10-CM | POA: Diagnosis not present

## 2016-10-07 DIAGNOSIS — Z466 Encounter for fitting and adjustment of urinary device: Secondary | ICD-10-CM | POA: Diagnosis not present

## 2016-10-07 DIAGNOSIS — G309 Alzheimer's disease, unspecified: Secondary | ICD-10-CM | POA: Diagnosis not present

## 2016-10-17 DIAGNOSIS — N184 Chronic kidney disease, stage 4 (severe): Secondary | ICD-10-CM | POA: Diagnosis not present

## 2016-10-17 DIAGNOSIS — R32 Unspecified urinary incontinence: Secondary | ICD-10-CM | POA: Diagnosis not present

## 2016-10-17 DIAGNOSIS — I129 Hypertensive chronic kidney disease with stage 1 through stage 4 chronic kidney disease, or unspecified chronic kidney disease: Secondary | ICD-10-CM | POA: Diagnosis not present

## 2016-10-17 DIAGNOSIS — K8020 Calculus of gallbladder without cholecystitis without obstruction: Secondary | ICD-10-CM | POA: Diagnosis not present

## 2016-10-17 DIAGNOSIS — Z466 Encounter for fitting and adjustment of urinary device: Secondary | ICD-10-CM | POA: Diagnosis not present

## 2016-10-17 DIAGNOSIS — G309 Alzheimer's disease, unspecified: Secondary | ICD-10-CM | POA: Diagnosis not present

## 2016-10-21 ENCOUNTER — Ambulatory Visit
Admission: RE | Admit: 2016-10-21 | Discharge: 2016-10-21 | Disposition: A | Discharge status: Home / Self Care | Payer: Medicare Other | Source: Ambulatory Visit | Attending: Internal Medicine | Admitting: Internal Medicine

## 2016-10-21 ENCOUNTER — Other Ambulatory Visit: Payer: Self-pay | Admitting: Internal Medicine

## 2016-10-21 DIAGNOSIS — M25511 Pain in right shoulder: Secondary | ICD-10-CM

## 2016-10-21 DIAGNOSIS — K8051 Calculus of bile duct without cholangitis or cholecystitis with obstruction: Secondary | ICD-10-CM | POA: Diagnosis not present

## 2016-10-21 DIAGNOSIS — M19011 Primary osteoarthritis, right shoulder: Secondary | ICD-10-CM | POA: Diagnosis not present

## 2016-10-21 DIAGNOSIS — C229 Malignant neoplasm of liver, not specified as primary or secondary: Secondary | ICD-10-CM | POA: Diagnosis not present

## 2016-10-21 DIAGNOSIS — F0390 Unspecified dementia without behavioral disturbance: Secondary | ICD-10-CM | POA: Diagnosis not present

## 2016-10-21 DIAGNOSIS — I10 Essential (primary) hypertension: Secondary | ICD-10-CM | POA: Diagnosis not present

## 2016-10-21 DIAGNOSIS — N183 Chronic kidney disease, stage 3 (moderate): Secondary | ICD-10-CM | POA: Diagnosis not present

## 2016-10-29 DIAGNOSIS — Z466 Encounter for fitting and adjustment of urinary device: Secondary | ICD-10-CM | POA: Diagnosis not present

## 2016-10-29 DIAGNOSIS — N184 Chronic kidney disease, stage 4 (severe): Secondary | ICD-10-CM | POA: Diagnosis not present

## 2016-10-29 DIAGNOSIS — R32 Unspecified urinary incontinence: Secondary | ICD-10-CM | POA: Diagnosis not present

## 2016-10-29 DIAGNOSIS — K8020 Calculus of gallbladder without cholecystitis without obstruction: Secondary | ICD-10-CM | POA: Diagnosis not present

## 2016-10-29 DIAGNOSIS — I129 Hypertensive chronic kidney disease with stage 1 through stage 4 chronic kidney disease, or unspecified chronic kidney disease: Secondary | ICD-10-CM | POA: Diagnosis not present

## 2016-10-29 DIAGNOSIS — G309 Alzheimer's disease, unspecified: Secondary | ICD-10-CM | POA: Diagnosis not present

## 2016-11-04 DIAGNOSIS — R32 Unspecified urinary incontinence: Secondary | ICD-10-CM | POA: Diagnosis not present

## 2016-11-04 DIAGNOSIS — N184 Chronic kidney disease, stage 4 (severe): Secondary | ICD-10-CM | POA: Diagnosis not present

## 2016-11-04 DIAGNOSIS — I129 Hypertensive chronic kidney disease with stage 1 through stage 4 chronic kidney disease, or unspecified chronic kidney disease: Secondary | ICD-10-CM | POA: Diagnosis not present

## 2016-11-04 DIAGNOSIS — K8020 Calculus of gallbladder without cholecystitis without obstruction: Secondary | ICD-10-CM | POA: Diagnosis not present

## 2016-11-04 DIAGNOSIS — G309 Alzheimer's disease, unspecified: Secondary | ICD-10-CM | POA: Diagnosis not present

## 2016-11-04 DIAGNOSIS — Z466 Encounter for fitting and adjustment of urinary device: Secondary | ICD-10-CM | POA: Diagnosis not present

## 2016-11-05 DIAGNOSIS — M19011 Primary osteoarthritis, right shoulder: Secondary | ICD-10-CM | POA: Diagnosis not present

## 2016-11-08 DIAGNOSIS — I129 Hypertensive chronic kidney disease with stage 1 through stage 4 chronic kidney disease, or unspecified chronic kidney disease: Secondary | ICD-10-CM | POA: Diagnosis not present

## 2016-11-08 DIAGNOSIS — N184 Chronic kidney disease, stage 4 (severe): Secondary | ICD-10-CM | POA: Diagnosis not present

## 2016-11-08 DIAGNOSIS — G309 Alzheimer's disease, unspecified: Secondary | ICD-10-CM | POA: Diagnosis not present

## 2016-11-08 DIAGNOSIS — Z466 Encounter for fitting and adjustment of urinary device: Secondary | ICD-10-CM | POA: Diagnosis not present

## 2016-11-08 DIAGNOSIS — R32 Unspecified urinary incontinence: Secondary | ICD-10-CM | POA: Diagnosis not present

## 2016-11-08 DIAGNOSIS — K8020 Calculus of gallbladder without cholecystitis without obstruction: Secondary | ICD-10-CM | POA: Diagnosis not present

## 2016-11-11 DIAGNOSIS — Z85828 Personal history of other malignant neoplasm of skin: Secondary | ICD-10-CM | POA: Diagnosis not present

## 2016-11-11 DIAGNOSIS — K8050 Calculus of bile duct without cholangitis or cholecystitis without obstruction: Secondary | ICD-10-CM | POA: Diagnosis not present

## 2016-11-11 DIAGNOSIS — G510 Bell's palsy: Secondary | ICD-10-CM | POA: Diagnosis not present

## 2016-11-11 DIAGNOSIS — Z993 Dependence on wheelchair: Secondary | ICD-10-CM | POA: Diagnosis not present

## 2016-11-11 DIAGNOSIS — Z853 Personal history of malignant neoplasm of breast: Secondary | ICD-10-CM | POA: Diagnosis not present

## 2016-11-11 DIAGNOSIS — K831 Obstruction of bile duct: Secondary | ICD-10-CM | POA: Diagnosis not present

## 2016-11-11 DIAGNOSIS — G309 Alzheimer's disease, unspecified: Secondary | ICD-10-CM | POA: Diagnosis not present

## 2016-11-11 DIAGNOSIS — I10 Essential (primary) hypertension: Secondary | ICD-10-CM | POA: Diagnosis not present

## 2016-11-11 DIAGNOSIS — Z8509 Personal history of malignant neoplasm of other digestive organs: Secondary | ICD-10-CM | POA: Diagnosis not present

## 2016-11-14 DIAGNOSIS — R32 Unspecified urinary incontinence: Secondary | ICD-10-CM | POA: Diagnosis not present

## 2016-11-14 DIAGNOSIS — K8020 Calculus of gallbladder without cholecystitis without obstruction: Secondary | ICD-10-CM | POA: Diagnosis not present

## 2016-11-14 DIAGNOSIS — G309 Alzheimer's disease, unspecified: Secondary | ICD-10-CM | POA: Diagnosis not present

## 2016-11-14 DIAGNOSIS — N184 Chronic kidney disease, stage 4 (severe): Secondary | ICD-10-CM | POA: Diagnosis not present

## 2016-11-14 DIAGNOSIS — I129 Hypertensive chronic kidney disease with stage 1 through stage 4 chronic kidney disease, or unspecified chronic kidney disease: Secondary | ICD-10-CM | POA: Diagnosis not present

## 2016-11-14 DIAGNOSIS — Z466 Encounter for fitting and adjustment of urinary device: Secondary | ICD-10-CM | POA: Diagnosis not present

## 2016-12-03 DIAGNOSIS — K436 Other and unspecified ventral hernia with obstruction, without gangrene: Secondary | ICD-10-CM | POA: Diagnosis not present

## 2016-12-03 DIAGNOSIS — K8020 Calculus of gallbladder without cholecystitis without obstruction: Secondary | ICD-10-CM | POA: Diagnosis not present

## 2016-12-03 DIAGNOSIS — N184 Chronic kidney disease, stage 4 (severe): Secondary | ICD-10-CM | POA: Diagnosis not present

## 2016-12-03 DIAGNOSIS — Z466 Encounter for fitting and adjustment of urinary device: Secondary | ICD-10-CM | POA: Diagnosis not present

## 2016-12-03 DIAGNOSIS — R32 Unspecified urinary incontinence: Secondary | ICD-10-CM | POA: Diagnosis not present

## 2016-12-03 DIAGNOSIS — I129 Hypertensive chronic kidney disease with stage 1 through stage 4 chronic kidney disease, or unspecified chronic kidney disease: Secondary | ICD-10-CM | POA: Diagnosis not present

## 2016-12-03 DIAGNOSIS — G309 Alzheimer's disease, unspecified: Secondary | ICD-10-CM | POA: Diagnosis not present

## 2016-12-06 DIAGNOSIS — I129 Hypertensive chronic kidney disease with stage 1 through stage 4 chronic kidney disease, or unspecified chronic kidney disease: Secondary | ICD-10-CM | POA: Diagnosis not present

## 2016-12-06 DIAGNOSIS — K8020 Calculus of gallbladder without cholecystitis without obstruction: Secondary | ICD-10-CM | POA: Diagnosis not present

## 2016-12-06 DIAGNOSIS — R32 Unspecified urinary incontinence: Secondary | ICD-10-CM | POA: Diagnosis not present

## 2016-12-06 DIAGNOSIS — N184 Chronic kidney disease, stage 4 (severe): Secondary | ICD-10-CM | POA: Diagnosis not present

## 2016-12-06 DIAGNOSIS — Z466 Encounter for fitting and adjustment of urinary device: Secondary | ICD-10-CM | POA: Diagnosis not present

## 2016-12-06 DIAGNOSIS — G309 Alzheimer's disease, unspecified: Secondary | ICD-10-CM | POA: Diagnosis not present

## 2016-12-08 DIAGNOSIS — D49512 Neoplasm of unspecified behavior of left kidney: Secondary | ICD-10-CM | POA: Diagnosis not present

## 2016-12-08 DIAGNOSIS — R32 Unspecified urinary incontinence: Secondary | ICD-10-CM | POA: Diagnosis not present

## 2016-12-11 DIAGNOSIS — I129 Hypertensive chronic kidney disease with stage 1 through stage 4 chronic kidney disease, or unspecified chronic kidney disease: Secondary | ICD-10-CM | POA: Diagnosis not present

## 2016-12-11 DIAGNOSIS — K8020 Calculus of gallbladder without cholecystitis without obstruction: Secondary | ICD-10-CM | POA: Diagnosis not present

## 2016-12-11 DIAGNOSIS — Z466 Encounter for fitting and adjustment of urinary device: Secondary | ICD-10-CM | POA: Diagnosis not present

## 2016-12-11 DIAGNOSIS — N184 Chronic kidney disease, stage 4 (severe): Secondary | ICD-10-CM | POA: Diagnosis not present

## 2016-12-11 DIAGNOSIS — R32 Unspecified urinary incontinence: Secondary | ICD-10-CM | POA: Diagnosis not present

## 2016-12-11 DIAGNOSIS — G309 Alzheimer's disease, unspecified: Secondary | ICD-10-CM | POA: Diagnosis not present

## 2016-12-12 DIAGNOSIS — M19011 Primary osteoarthritis, right shoulder: Secondary | ICD-10-CM | POA: Diagnosis not present

## 2016-12-23 DIAGNOSIS — J90 Pleural effusion, not elsewhere classified: Secondary | ICD-10-CM | POA: Diagnosis not present

## 2016-12-23 DIAGNOSIS — Z79899 Other long term (current) drug therapy: Secondary | ICD-10-CM | POA: Diagnosis not present

## 2016-12-23 DIAGNOSIS — Z993 Dependence on wheelchair: Secondary | ICD-10-CM | POA: Diagnosis not present

## 2016-12-23 DIAGNOSIS — Z880 Allergy status to penicillin: Secondary | ICD-10-CM | POA: Diagnosis not present

## 2016-12-23 DIAGNOSIS — Z5111 Encounter for antineoplastic chemotherapy: Secondary | ICD-10-CM | POA: Diagnosis not present

## 2016-12-23 DIAGNOSIS — N2889 Other specified disorders of kidney and ureter: Secondary | ICD-10-CM | POA: Diagnosis not present

## 2016-12-23 DIAGNOSIS — I10 Essential (primary) hypertension: Secondary | ICD-10-CM | POA: Diagnosis not present

## 2016-12-23 DIAGNOSIS — Z9889 Other specified postprocedural states: Secondary | ICD-10-CM | POA: Diagnosis not present

## 2016-12-23 DIAGNOSIS — Z85828 Personal history of other malignant neoplasm of skin: Secondary | ICD-10-CM | POA: Diagnosis not present

## 2016-12-23 DIAGNOSIS — K8020 Calculus of gallbladder without cholecystitis without obstruction: Secondary | ICD-10-CM | POA: Diagnosis not present

## 2016-12-23 DIAGNOSIS — Z8509 Personal history of malignant neoplasm of other digestive organs: Secondary | ICD-10-CM | POA: Diagnosis not present

## 2016-12-23 DIAGNOSIS — H04129 Dry eye syndrome of unspecified lacrimal gland: Secondary | ICD-10-CM | POA: Diagnosis not present

## 2016-12-23 DIAGNOSIS — Z85841 Personal history of malignant neoplasm of brain: Secondary | ICD-10-CM | POA: Diagnosis not present

## 2016-12-23 DIAGNOSIS — Z08 Encounter for follow-up examination after completed treatment for malignant neoplasm: Secondary | ICD-10-CM | POA: Diagnosis not present

## 2016-12-23 DIAGNOSIS — Z853 Personal history of malignant neoplasm of breast: Secondary | ICD-10-CM | POA: Diagnosis not present

## 2016-12-23 DIAGNOSIS — C49A3 Gastrointestinal stromal tumor of small intestine: Secondary | ICD-10-CM | POA: Diagnosis not present

## 2016-12-23 DIAGNOSIS — F0390 Unspecified dementia without behavioral disturbance: Secondary | ICD-10-CM | POA: Diagnosis not present

## 2017-01-06 DIAGNOSIS — R32 Unspecified urinary incontinence: Secondary | ICD-10-CM | POA: Diagnosis not present

## 2017-01-06 DIAGNOSIS — N184 Chronic kidney disease, stage 4 (severe): Secondary | ICD-10-CM | POA: Diagnosis not present

## 2017-01-06 DIAGNOSIS — K8020 Calculus of gallbladder without cholecystitis without obstruction: Secondary | ICD-10-CM | POA: Diagnosis not present

## 2017-01-06 DIAGNOSIS — I129 Hypertensive chronic kidney disease with stage 1 through stage 4 chronic kidney disease, or unspecified chronic kidney disease: Secondary | ICD-10-CM | POA: Diagnosis not present

## 2017-01-06 DIAGNOSIS — Z466 Encounter for fitting and adjustment of urinary device: Secondary | ICD-10-CM | POA: Diagnosis not present

## 2017-01-06 DIAGNOSIS — G309 Alzheimer's disease, unspecified: Secondary | ICD-10-CM | POA: Diagnosis not present

## 2017-01-07 DIAGNOSIS — I129 Hypertensive chronic kidney disease with stage 1 through stage 4 chronic kidney disease, or unspecified chronic kidney disease: Secondary | ICD-10-CM | POA: Diagnosis not present

## 2017-01-07 DIAGNOSIS — R32 Unspecified urinary incontinence: Secondary | ICD-10-CM | POA: Diagnosis not present

## 2017-01-07 DIAGNOSIS — N184 Chronic kidney disease, stage 4 (severe): Secondary | ICD-10-CM | POA: Diagnosis not present

## 2017-01-07 DIAGNOSIS — K8020 Calculus of gallbladder without cholecystitis without obstruction: Secondary | ICD-10-CM | POA: Diagnosis not present

## 2017-01-07 DIAGNOSIS — Z466 Encounter for fitting and adjustment of urinary device: Secondary | ICD-10-CM | POA: Diagnosis not present

## 2017-01-07 DIAGNOSIS — G309 Alzheimer's disease, unspecified: Secondary | ICD-10-CM | POA: Diagnosis not present

## 2017-01-08 DIAGNOSIS — G309 Alzheimer's disease, unspecified: Secondary | ICD-10-CM | POA: Diagnosis not present

## 2017-01-08 DIAGNOSIS — I129 Hypertensive chronic kidney disease with stage 1 through stage 4 chronic kidney disease, or unspecified chronic kidney disease: Secondary | ICD-10-CM | POA: Diagnosis not present

## 2017-01-08 DIAGNOSIS — K8020 Calculus of gallbladder without cholecystitis without obstruction: Secondary | ICD-10-CM | POA: Diagnosis not present

## 2017-01-08 DIAGNOSIS — N184 Chronic kidney disease, stage 4 (severe): Secondary | ICD-10-CM | POA: Diagnosis not present

## 2017-01-08 DIAGNOSIS — R32 Unspecified urinary incontinence: Secondary | ICD-10-CM | POA: Diagnosis not present

## 2017-01-08 DIAGNOSIS — Z466 Encounter for fitting and adjustment of urinary device: Secondary | ICD-10-CM | POA: Diagnosis not present

## 2017-01-12 DIAGNOSIS — G309 Alzheimer's disease, unspecified: Secondary | ICD-10-CM | POA: Diagnosis not present

## 2017-01-12 DIAGNOSIS — R32 Unspecified urinary incontinence: Secondary | ICD-10-CM | POA: Diagnosis not present

## 2017-01-12 DIAGNOSIS — K8020 Calculus of gallbladder without cholecystitis without obstruction: Secondary | ICD-10-CM | POA: Diagnosis not present

## 2017-01-12 DIAGNOSIS — N184 Chronic kidney disease, stage 4 (severe): Secondary | ICD-10-CM | POA: Diagnosis not present

## 2017-01-12 DIAGNOSIS — I129 Hypertensive chronic kidney disease with stage 1 through stage 4 chronic kidney disease, or unspecified chronic kidney disease: Secondary | ICD-10-CM | POA: Diagnosis not present

## 2017-01-12 DIAGNOSIS — Z466 Encounter for fitting and adjustment of urinary device: Secondary | ICD-10-CM | POA: Diagnosis not present

## 2017-01-19 DIAGNOSIS — D333 Benign neoplasm of cranial nerves: Secondary | ICD-10-CM | POA: Diagnosis not present

## 2017-01-19 DIAGNOSIS — D214 Benign neoplasm of connective and other soft tissue of abdomen: Secondary | ICD-10-CM | POA: Diagnosis not present

## 2017-01-19 DIAGNOSIS — R7989 Other specified abnormal findings of blood chemistry: Secondary | ICD-10-CM | POA: Diagnosis not present

## 2017-01-19 DIAGNOSIS — N183 Chronic kidney disease, stage 3 (moderate): Secondary | ICD-10-CM | POA: Diagnosis not present

## 2017-01-19 DIAGNOSIS — C50919 Malignant neoplasm of unspecified site of unspecified female breast: Secondary | ICD-10-CM | POA: Diagnosis not present

## 2017-01-19 DIAGNOSIS — I10 Essential (primary) hypertension: Secondary | ICD-10-CM | POA: Diagnosis not present

## 2017-01-19 DIAGNOSIS — F0390 Unspecified dementia without behavioral disturbance: Secondary | ICD-10-CM | POA: Diagnosis not present

## 2017-01-19 DIAGNOSIS — N319 Neuromuscular dysfunction of bladder, unspecified: Secondary | ICD-10-CM | POA: Diagnosis not present

## 2017-01-19 DIAGNOSIS — J309 Allergic rhinitis, unspecified: Secondary | ICD-10-CM | POA: Diagnosis not present

## 2017-01-19 DIAGNOSIS — D649 Anemia, unspecified: Secondary | ICD-10-CM | POA: Diagnosis not present

## 2017-02-02 DIAGNOSIS — K8020 Calculus of gallbladder without cholecystitis without obstruction: Secondary | ICD-10-CM | POA: Diagnosis not present

## 2017-02-02 DIAGNOSIS — N2889 Other specified disorders of kidney and ureter: Secondary | ICD-10-CM | POA: Diagnosis not present

## 2017-02-02 DIAGNOSIS — I129 Hypertensive chronic kidney disease with stage 1 through stage 4 chronic kidney disease, or unspecified chronic kidney disease: Secondary | ICD-10-CM | POA: Diagnosis not present

## 2017-02-02 DIAGNOSIS — Z466 Encounter for fitting and adjustment of urinary device: Secondary | ICD-10-CM | POA: Diagnosis not present

## 2017-02-02 DIAGNOSIS — G309 Alzheimer's disease, unspecified: Secondary | ICD-10-CM | POA: Diagnosis not present

## 2017-02-02 DIAGNOSIS — N289 Disorder of kidney and ureter, unspecified: Secondary | ICD-10-CM | POA: Diagnosis not present

## 2017-02-02 DIAGNOSIS — N184 Chronic kidney disease, stage 4 (severe): Secondary | ICD-10-CM | POA: Diagnosis not present

## 2017-02-02 DIAGNOSIS — R32 Unspecified urinary incontinence: Secondary | ICD-10-CM | POA: Diagnosis not present

## 2017-02-04 DIAGNOSIS — Z466 Encounter for fitting and adjustment of urinary device: Secondary | ICD-10-CM | POA: Diagnosis not present

## 2017-02-04 DIAGNOSIS — N184 Chronic kidney disease, stage 4 (severe): Secondary | ICD-10-CM | POA: Diagnosis not present

## 2017-02-04 DIAGNOSIS — I129 Hypertensive chronic kidney disease with stage 1 through stage 4 chronic kidney disease, or unspecified chronic kidney disease: Secondary | ICD-10-CM | POA: Diagnosis not present

## 2017-02-04 DIAGNOSIS — K8020 Calculus of gallbladder without cholecystitis without obstruction: Secondary | ICD-10-CM | POA: Diagnosis not present

## 2017-02-04 DIAGNOSIS — G309 Alzheimer's disease, unspecified: Secondary | ICD-10-CM | POA: Diagnosis not present

## 2017-02-04 DIAGNOSIS — R32 Unspecified urinary incontinence: Secondary | ICD-10-CM | POA: Diagnosis not present

## 2017-02-09 DIAGNOSIS — Z466 Encounter for fitting and adjustment of urinary device: Secondary | ICD-10-CM | POA: Diagnosis not present

## 2017-02-09 DIAGNOSIS — I129 Hypertensive chronic kidney disease with stage 1 through stage 4 chronic kidney disease, or unspecified chronic kidney disease: Secondary | ICD-10-CM | POA: Diagnosis not present

## 2017-02-09 DIAGNOSIS — N184 Chronic kidney disease, stage 4 (severe): Secondary | ICD-10-CM | POA: Diagnosis not present

## 2017-02-09 DIAGNOSIS — R32 Unspecified urinary incontinence: Secondary | ICD-10-CM | POA: Diagnosis not present

## 2017-02-09 DIAGNOSIS — G309 Alzheimer's disease, unspecified: Secondary | ICD-10-CM | POA: Diagnosis not present

## 2017-02-09 DIAGNOSIS — K8020 Calculus of gallbladder without cholecystitis without obstruction: Secondary | ICD-10-CM | POA: Diagnosis not present

## 2017-03-02 DIAGNOSIS — H16211 Exposure keratoconjunctivitis, right eye: Secondary | ICD-10-CM | POA: Diagnosis not present

## 2017-03-08 DIAGNOSIS — N184 Chronic kidney disease, stage 4 (severe): Secondary | ICD-10-CM | POA: Diagnosis not present

## 2017-03-08 DIAGNOSIS — I129 Hypertensive chronic kidney disease with stage 1 through stage 4 chronic kidney disease, or unspecified chronic kidney disease: Secondary | ICD-10-CM | POA: Diagnosis not present

## 2017-03-08 DIAGNOSIS — R32 Unspecified urinary incontinence: Secondary | ICD-10-CM | POA: Diagnosis not present

## 2017-03-08 DIAGNOSIS — K8020 Calculus of gallbladder without cholecystitis without obstruction: Secondary | ICD-10-CM | POA: Diagnosis not present

## 2017-03-08 DIAGNOSIS — Z466 Encounter for fitting and adjustment of urinary device: Secondary | ICD-10-CM | POA: Diagnosis not present

## 2017-03-08 DIAGNOSIS — G309 Alzheimer's disease, unspecified: Secondary | ICD-10-CM | POA: Diagnosis not present

## 2017-03-18 DIAGNOSIS — R32 Unspecified urinary incontinence: Secondary | ICD-10-CM | POA: Diagnosis not present

## 2017-03-18 DIAGNOSIS — Z466 Encounter for fitting and adjustment of urinary device: Secondary | ICD-10-CM | POA: Diagnosis not present

## 2017-03-18 DIAGNOSIS — G309 Alzheimer's disease, unspecified: Secondary | ICD-10-CM | POA: Diagnosis not present

## 2017-03-18 DIAGNOSIS — I129 Hypertensive chronic kidney disease with stage 1 through stage 4 chronic kidney disease, or unspecified chronic kidney disease: Secondary | ICD-10-CM | POA: Diagnosis not present

## 2017-03-18 DIAGNOSIS — K8020 Calculus of gallbladder without cholecystitis without obstruction: Secondary | ICD-10-CM | POA: Diagnosis not present

## 2017-03-18 DIAGNOSIS — N184 Chronic kidney disease, stage 4 (severe): Secondary | ICD-10-CM | POA: Diagnosis not present

## 2017-03-19 DIAGNOSIS — G309 Alzheimer's disease, unspecified: Secondary | ICD-10-CM | POA: Diagnosis not present

## 2017-03-19 DIAGNOSIS — I129 Hypertensive chronic kidney disease with stage 1 through stage 4 chronic kidney disease, or unspecified chronic kidney disease: Secondary | ICD-10-CM | POA: Diagnosis not present

## 2017-03-19 DIAGNOSIS — R32 Unspecified urinary incontinence: Secondary | ICD-10-CM | POA: Diagnosis not present

## 2017-03-19 DIAGNOSIS — Z466 Encounter for fitting and adjustment of urinary device: Secondary | ICD-10-CM | POA: Diagnosis not present

## 2017-03-19 DIAGNOSIS — N184 Chronic kidney disease, stage 4 (severe): Secondary | ICD-10-CM | POA: Diagnosis not present

## 2017-03-19 DIAGNOSIS — K8020 Calculus of gallbladder without cholecystitis without obstruction: Secondary | ICD-10-CM | POA: Diagnosis not present

## 2017-03-28 DIAGNOSIS — Z466 Encounter for fitting and adjustment of urinary device: Secondary | ICD-10-CM | POA: Diagnosis not present

## 2017-03-28 DIAGNOSIS — I129 Hypertensive chronic kidney disease with stage 1 through stage 4 chronic kidney disease, or unspecified chronic kidney disease: Secondary | ICD-10-CM | POA: Diagnosis not present

## 2017-03-28 DIAGNOSIS — G309 Alzheimer's disease, unspecified: Secondary | ICD-10-CM | POA: Diagnosis not present

## 2017-03-28 DIAGNOSIS — K8020 Calculus of gallbladder without cholecystitis without obstruction: Secondary | ICD-10-CM | POA: Diagnosis not present

## 2017-03-28 DIAGNOSIS — N184 Chronic kidney disease, stage 4 (severe): Secondary | ICD-10-CM | POA: Diagnosis not present

## 2017-03-28 DIAGNOSIS — R32 Unspecified urinary incontinence: Secondary | ICD-10-CM | POA: Diagnosis not present

## 2017-04-08 DIAGNOSIS — R339 Retention of urine, unspecified: Secondary | ICD-10-CM | POA: Diagnosis not present

## 2017-04-09 DIAGNOSIS — N368 Other specified disorders of urethra: Secondary | ICD-10-CM | POA: Diagnosis not present

## 2017-04-09 DIAGNOSIS — T83031D Leakage of indwelling urethral catheter, subsequent encounter: Secondary | ICD-10-CM | POA: Diagnosis not present

## 2017-04-14 DIAGNOSIS — T83031D Leakage of indwelling urethral catheter, subsequent encounter: Secondary | ICD-10-CM | POA: Diagnosis not present

## 2017-04-14 DIAGNOSIS — N368 Other specified disorders of urethra: Secondary | ICD-10-CM | POA: Diagnosis not present

## 2017-04-20 DIAGNOSIS — N368 Other specified disorders of urethra: Secondary | ICD-10-CM | POA: Diagnosis not present

## 2017-04-20 DIAGNOSIS — T83031D Leakage of indwelling urethral catheter, subsequent encounter: Secondary | ICD-10-CM | POA: Diagnosis not present

## 2017-04-27 DIAGNOSIS — I10 Essential (primary) hypertension: Secondary | ICD-10-CM | POA: Diagnosis not present

## 2017-04-27 DIAGNOSIS — R300 Dysuria: Secondary | ICD-10-CM | POA: Diagnosis not present

## 2017-04-27 DIAGNOSIS — D696 Thrombocytopenia, unspecified: Secondary | ICD-10-CM | POA: Diagnosis not present

## 2017-04-27 DIAGNOSIS — F0390 Unspecified dementia without behavioral disturbance: Secondary | ICD-10-CM | POA: Diagnosis not present

## 2017-04-27 DIAGNOSIS — R21 Rash and other nonspecific skin eruption: Secondary | ICD-10-CM | POA: Diagnosis not present

## 2017-04-27 DIAGNOSIS — N319 Neuromuscular dysfunction of bladder, unspecified: Secondary | ICD-10-CM | POA: Diagnosis not present

## 2017-04-27 DIAGNOSIS — R269 Unspecified abnormalities of gait and mobility: Secondary | ICD-10-CM | POA: Diagnosis not present

## 2017-04-27 DIAGNOSIS — D333 Benign neoplasm of cranial nerves: Secondary | ICD-10-CM | POA: Diagnosis not present

## 2017-04-27 DIAGNOSIS — I8393 Asymptomatic varicose veins of bilateral lower extremities: Secondary | ICD-10-CM | POA: Diagnosis not present

## 2017-04-27 DIAGNOSIS — N183 Chronic kidney disease, stage 3 (moderate): Secondary | ICD-10-CM | POA: Diagnosis not present

## 2017-04-27 DIAGNOSIS — D214 Benign neoplasm of connective and other soft tissue of abdomen: Secondary | ICD-10-CM | POA: Diagnosis not present

## 2017-04-27 DIAGNOSIS — C50919 Malignant neoplasm of unspecified site of unspecified female breast: Secondary | ICD-10-CM | POA: Diagnosis not present

## 2017-04-28 DIAGNOSIS — N368 Other specified disorders of urethra: Secondary | ICD-10-CM | POA: Diagnosis not present

## 2017-04-28 DIAGNOSIS — T83031D Leakage of indwelling urethral catheter, subsequent encounter: Secondary | ICD-10-CM | POA: Diagnosis not present

## 2017-05-05 DIAGNOSIS — N368 Other specified disorders of urethra: Secondary | ICD-10-CM | POA: Diagnosis not present

## 2017-05-05 DIAGNOSIS — T83031D Leakage of indwelling urethral catheter, subsequent encounter: Secondary | ICD-10-CM | POA: Diagnosis not present

## 2017-05-07 DIAGNOSIS — T83031D Leakage of indwelling urethral catheter, subsequent encounter: Secondary | ICD-10-CM | POA: Diagnosis not present

## 2017-05-07 DIAGNOSIS — N368 Other specified disorders of urethra: Secondary | ICD-10-CM | POA: Diagnosis not present

## 2017-05-20 DIAGNOSIS — T83031D Leakage of indwelling urethral catheter, subsequent encounter: Secondary | ICD-10-CM | POA: Diagnosis not present

## 2017-05-20 DIAGNOSIS — N368 Other specified disorders of urethra: Secondary | ICD-10-CM | POA: Diagnosis not present

## 2017-06-04 DIAGNOSIS — N368 Other specified disorders of urethra: Secondary | ICD-10-CM | POA: Diagnosis not present

## 2017-06-04 DIAGNOSIS — T83031D Leakage of indwelling urethral catheter, subsequent encounter: Secondary | ICD-10-CM | POA: Diagnosis not present

## 2017-06-08 DIAGNOSIS — N368 Other specified disorders of urethra: Secondary | ICD-10-CM | POA: Diagnosis not present

## 2017-06-08 DIAGNOSIS — N3289 Other specified disorders of bladder: Secondary | ICD-10-CM | POA: Diagnosis not present

## 2017-06-15 DIAGNOSIS — H1132 Conjunctival hemorrhage, left eye: Secondary | ICD-10-CM | POA: Diagnosis not present

## 2017-06-16 DIAGNOSIS — H6122 Impacted cerumen, left ear: Secondary | ICD-10-CM | POA: Diagnosis not present

## 2017-06-16 DIAGNOSIS — H722X2 Other marginal perforations of tympanic membrane, left ear: Secondary | ICD-10-CM | POA: Diagnosis not present

## 2017-06-18 DIAGNOSIS — N3289 Other specified disorders of bladder: Secondary | ICD-10-CM | POA: Diagnosis not present

## 2017-06-18 DIAGNOSIS — N368 Other specified disorders of urethra: Secondary | ICD-10-CM | POA: Diagnosis not present

## 2017-06-23 DIAGNOSIS — Z9011 Acquired absence of right breast and nipple: Secondary | ICD-10-CM | POA: Diagnosis not present

## 2017-06-23 DIAGNOSIS — C49A3 Gastrointestinal stromal tumor of small intestine: Secondary | ICD-10-CM | POA: Diagnosis not present

## 2017-06-23 DIAGNOSIS — Z853 Personal history of malignant neoplasm of breast: Secondary | ICD-10-CM | POA: Diagnosis not present

## 2017-06-23 DIAGNOSIS — N2889 Other specified disorders of kidney and ureter: Secondary | ICD-10-CM | POA: Diagnosis not present

## 2017-06-30 DIAGNOSIS — N3289 Other specified disorders of bladder: Secondary | ICD-10-CM | POA: Diagnosis not present

## 2017-06-30 DIAGNOSIS — N368 Other specified disorders of urethra: Secondary | ICD-10-CM | POA: Diagnosis not present

## 2017-07-01 DIAGNOSIS — N3289 Other specified disorders of bladder: Secondary | ICD-10-CM | POA: Diagnosis not present

## 2017-07-01 DIAGNOSIS — N368 Other specified disorders of urethra: Secondary | ICD-10-CM | POA: Diagnosis not present

## 2017-07-10 DIAGNOSIS — N3289 Other specified disorders of bladder: Secondary | ICD-10-CM | POA: Diagnosis not present

## 2017-07-10 DIAGNOSIS — N368 Other specified disorders of urethra: Secondary | ICD-10-CM | POA: Diagnosis not present

## 2017-07-13 DIAGNOSIS — D333 Benign neoplasm of cranial nerves: Secondary | ICD-10-CM | POA: Diagnosis not present

## 2017-07-13 DIAGNOSIS — Z1389 Encounter for screening for other disorder: Secondary | ICD-10-CM | POA: Diagnosis not present

## 2017-07-13 DIAGNOSIS — C229 Malignant neoplasm of liver, not specified as primary or secondary: Secondary | ICD-10-CM | POA: Diagnosis not present

## 2017-07-13 DIAGNOSIS — E538 Deficiency of other specified B group vitamins: Secondary | ICD-10-CM | POA: Diagnosis not present

## 2017-07-13 DIAGNOSIS — M8580 Other specified disorders of bone density and structure, unspecified site: Secondary | ICD-10-CM | POA: Diagnosis not present

## 2017-07-13 DIAGNOSIS — D696 Thrombocytopenia, unspecified: Secondary | ICD-10-CM | POA: Diagnosis not present

## 2017-07-13 DIAGNOSIS — R7301 Impaired fasting glucose: Secondary | ICD-10-CM | POA: Diagnosis not present

## 2017-07-13 DIAGNOSIS — N183 Chronic kidney disease, stage 3 (moderate): Secondary | ICD-10-CM | POA: Diagnosis not present

## 2017-07-13 DIAGNOSIS — I10 Essential (primary) hypertension: Secondary | ICD-10-CM | POA: Diagnosis not present

## 2017-07-13 DIAGNOSIS — D214 Benign neoplasm of connective and other soft tissue of abdomen: Secondary | ICD-10-CM | POA: Diagnosis not present

## 2017-07-13 DIAGNOSIS — Z23 Encounter for immunization: Secondary | ICD-10-CM | POA: Diagnosis not present

## 2017-07-13 DIAGNOSIS — M160 Bilateral primary osteoarthritis of hip: Secondary | ICD-10-CM | POA: Diagnosis not present

## 2017-07-13 DIAGNOSIS — Z0000 Encounter for general adult medical examination without abnormal findings: Secondary | ICD-10-CM | POA: Diagnosis not present

## 2017-07-13 DIAGNOSIS — N319 Neuromuscular dysfunction of bladder, unspecified: Secondary | ICD-10-CM | POA: Diagnosis not present

## 2017-07-13 DIAGNOSIS — R269 Unspecified abnormalities of gait and mobility: Secondary | ICD-10-CM | POA: Diagnosis not present

## 2017-07-13 DIAGNOSIS — F0390 Unspecified dementia without behavioral disturbance: Secondary | ICD-10-CM | POA: Diagnosis not present

## 2017-07-13 DIAGNOSIS — E7800 Pure hypercholesterolemia, unspecified: Secondary | ICD-10-CM | POA: Diagnosis not present

## 2017-07-13 DIAGNOSIS — C50919 Malignant neoplasm of unspecified site of unspecified female breast: Secondary | ICD-10-CM | POA: Diagnosis not present

## 2017-07-16 DIAGNOSIS — N3289 Other specified disorders of bladder: Secondary | ICD-10-CM | POA: Diagnosis not present

## 2017-07-16 DIAGNOSIS — N368 Other specified disorders of urethra: Secondary | ICD-10-CM | POA: Diagnosis not present

## 2017-07-24 DIAGNOSIS — N368 Other specified disorders of urethra: Secondary | ICD-10-CM | POA: Diagnosis not present

## 2017-07-24 DIAGNOSIS — N3289 Other specified disorders of bladder: Secondary | ICD-10-CM | POA: Diagnosis not present

## 2017-08-03 DIAGNOSIS — R300 Dysuria: Secondary | ICD-10-CM | POA: Diagnosis not present

## 2017-08-03 DIAGNOSIS — R197 Diarrhea, unspecified: Secondary | ICD-10-CM | POA: Diagnosis not present

## 2017-08-03 DIAGNOSIS — N3289 Other specified disorders of bladder: Secondary | ICD-10-CM | POA: Diagnosis not present

## 2017-08-04 DIAGNOSIS — N3289 Other specified disorders of bladder: Secondary | ICD-10-CM | POA: Diagnosis not present

## 2017-08-04 DIAGNOSIS — Z87440 Personal history of urinary (tract) infections: Secondary | ICD-10-CM | POA: Diagnosis not present

## 2017-08-04 DIAGNOSIS — R339 Retention of urine, unspecified: Secondary | ICD-10-CM | POA: Diagnosis not present

## 2017-08-04 DIAGNOSIS — N368 Other specified disorders of urethra: Secondary | ICD-10-CM | POA: Diagnosis not present

## 2017-08-05 DIAGNOSIS — N3289 Other specified disorders of bladder: Secondary | ICD-10-CM | POA: Diagnosis not present

## 2017-08-05 DIAGNOSIS — N368 Other specified disorders of urethra: Secondary | ICD-10-CM | POA: Diagnosis not present

## 2017-08-07 DIAGNOSIS — N368 Other specified disorders of urethra: Secondary | ICD-10-CM | POA: Diagnosis not present

## 2017-08-07 DIAGNOSIS — R339 Retention of urine, unspecified: Secondary | ICD-10-CM | POA: Diagnosis not present

## 2017-08-17 DIAGNOSIS — N368 Other specified disorders of urethra: Secondary | ICD-10-CM | POA: Diagnosis not present

## 2017-08-17 DIAGNOSIS — R339 Retention of urine, unspecified: Secondary | ICD-10-CM | POA: Diagnosis not present

## 2017-08-19 DIAGNOSIS — N368 Other specified disorders of urethra: Secondary | ICD-10-CM | POA: Diagnosis not present

## 2017-08-19 DIAGNOSIS — R339 Retention of urine, unspecified: Secondary | ICD-10-CM | POA: Diagnosis not present

## 2017-08-21 DIAGNOSIS — R339 Retention of urine, unspecified: Secondary | ICD-10-CM | POA: Diagnosis not present

## 2017-08-21 DIAGNOSIS — N368 Other specified disorders of urethra: Secondary | ICD-10-CM | POA: Diagnosis not present

## 2017-08-31 DIAGNOSIS — N368 Other specified disorders of urethra: Secondary | ICD-10-CM | POA: Diagnosis not present

## 2017-08-31 DIAGNOSIS — R339 Retention of urine, unspecified: Secondary | ICD-10-CM | POA: Diagnosis not present

## 2017-09-02 DIAGNOSIS — R339 Retention of urine, unspecified: Secondary | ICD-10-CM | POA: Diagnosis not present

## 2017-09-02 DIAGNOSIS — N368 Other specified disorders of urethra: Secondary | ICD-10-CM | POA: Diagnosis not present

## 2017-09-04 DIAGNOSIS — N368 Other specified disorders of urethra: Secondary | ICD-10-CM | POA: Diagnosis not present

## 2017-09-04 DIAGNOSIS — R339 Retention of urine, unspecified: Secondary | ICD-10-CM | POA: Diagnosis not present

## 2017-09-05 DIAGNOSIS — N368 Other specified disorders of urethra: Secondary | ICD-10-CM | POA: Diagnosis not present

## 2017-09-05 DIAGNOSIS — R339 Retention of urine, unspecified: Secondary | ICD-10-CM | POA: Diagnosis not present

## 2017-09-07 DIAGNOSIS — H52203 Unspecified astigmatism, bilateral: Secondary | ICD-10-CM | POA: Diagnosis not present

## 2017-09-07 DIAGNOSIS — Z961 Presence of intraocular lens: Secondary | ICD-10-CM | POA: Diagnosis not present

## 2017-09-07 DIAGNOSIS — H16211 Exposure keratoconjunctivitis, right eye: Secondary | ICD-10-CM | POA: Diagnosis not present

## 2017-09-07 DIAGNOSIS — G510 Bell's palsy: Secondary | ICD-10-CM | POA: Diagnosis not present

## 2017-09-11 DIAGNOSIS — R339 Retention of urine, unspecified: Secondary | ICD-10-CM | POA: Diagnosis not present

## 2017-09-11 DIAGNOSIS — N368 Other specified disorders of urethra: Secondary | ICD-10-CM | POA: Diagnosis not present

## 2017-09-14 DIAGNOSIS — N368 Other specified disorders of urethra: Secondary | ICD-10-CM | POA: Diagnosis not present

## 2017-09-14 DIAGNOSIS — R339 Retention of urine, unspecified: Secondary | ICD-10-CM | POA: Diagnosis not present

## 2017-09-15 DIAGNOSIS — N368 Other specified disorders of urethra: Secondary | ICD-10-CM | POA: Diagnosis not present

## 2017-09-15 DIAGNOSIS — R339 Retention of urine, unspecified: Secondary | ICD-10-CM | POA: Diagnosis not present

## 2017-09-16 DIAGNOSIS — N368 Other specified disorders of urethra: Secondary | ICD-10-CM | POA: Diagnosis not present

## 2017-09-16 DIAGNOSIS — R339 Retention of urine, unspecified: Secondary | ICD-10-CM | POA: Diagnosis not present

## 2017-09-25 DIAGNOSIS — N368 Other specified disorders of urethra: Secondary | ICD-10-CM | POA: Diagnosis not present

## 2017-09-25 DIAGNOSIS — R339 Retention of urine, unspecified: Secondary | ICD-10-CM | POA: Diagnosis not present

## 2017-10-01 DIAGNOSIS — R339 Retention of urine, unspecified: Secondary | ICD-10-CM | POA: Diagnosis not present

## 2017-10-01 DIAGNOSIS — N368 Other specified disorders of urethra: Secondary | ICD-10-CM | POA: Diagnosis not present

## 2017-10-05 DIAGNOSIS — N368 Other specified disorders of urethra: Secondary | ICD-10-CM | POA: Diagnosis not present

## 2017-10-05 DIAGNOSIS — G300 Alzheimer's disease with early onset: Secondary | ICD-10-CM | POA: Diagnosis not present

## 2017-10-05 DIAGNOSIS — F0280 Dementia in other diseases classified elsewhere without behavioral disturbance: Secondary | ICD-10-CM | POA: Diagnosis not present

## 2017-10-05 DIAGNOSIS — Z9289 Personal history of other medical treatment: Secondary | ICD-10-CM | POA: Diagnosis not present

## 2017-10-05 DIAGNOSIS — N2889 Other specified disorders of kidney and ureter: Secondary | ICD-10-CM | POA: Diagnosis not present

## 2017-10-05 DIAGNOSIS — R339 Retention of urine, unspecified: Secondary | ICD-10-CM | POA: Diagnosis not present

## 2017-10-05 DIAGNOSIS — C49A3 Gastrointestinal stromal tumor of small intestine: Secondary | ICD-10-CM | POA: Diagnosis not present

## 2017-10-09 DIAGNOSIS — Z79890 Hormone replacement therapy (postmenopausal): Secondary | ICD-10-CM | POA: Diagnosis not present

## 2017-10-09 DIAGNOSIS — Z6836 Body mass index (BMI) 36.0-36.9, adult: Secondary | ICD-10-CM | POA: Diagnosis not present

## 2017-10-09 DIAGNOSIS — F0280 Dementia in other diseases classified elsewhere without behavioral disturbance: Secondary | ICD-10-CM | POA: Diagnosis not present

## 2017-10-09 DIAGNOSIS — C50911 Malignant neoplasm of unspecified site of right female breast: Secondary | ICD-10-CM | POA: Diagnosis not present

## 2017-10-09 DIAGNOSIS — E669 Obesity, unspecified: Secondary | ICD-10-CM | POA: Diagnosis not present

## 2017-10-09 DIAGNOSIS — G300 Alzheimer's disease with early onset: Secondary | ICD-10-CM | POA: Diagnosis not present

## 2017-10-09 DIAGNOSIS — G629 Polyneuropathy, unspecified: Secondary | ICD-10-CM | POA: Diagnosis not present

## 2017-10-09 DIAGNOSIS — Z79899 Other long term (current) drug therapy: Secondary | ICD-10-CM | POA: Diagnosis not present

## 2017-10-09 DIAGNOSIS — C49A0 Gastrointestinal stromal tumor, unspecified site: Secondary | ICD-10-CM | POA: Diagnosis not present

## 2017-10-09 DIAGNOSIS — N2889 Other specified disorders of kidney and ureter: Secondary | ICD-10-CM | POA: Diagnosis not present

## 2017-10-09 DIAGNOSIS — Z9223 Personal history of estrogen therapy: Secondary | ICD-10-CM | POA: Diagnosis not present

## 2017-10-09 DIAGNOSIS — Z853 Personal history of malignant neoplasm of breast: Secondary | ICD-10-CM | POA: Diagnosis not present

## 2017-10-09 DIAGNOSIS — M1990 Unspecified osteoarthritis, unspecified site: Secondary | ICD-10-CM | POA: Diagnosis not present

## 2017-10-09 DIAGNOSIS — G510 Bell's palsy: Secondary | ICD-10-CM | POA: Diagnosis not present

## 2017-10-09 DIAGNOSIS — I10 Essential (primary) hypertension: Secondary | ICD-10-CM | POA: Diagnosis not present

## 2017-10-09 DIAGNOSIS — D333 Benign neoplasm of cranial nerves: Secondary | ICD-10-CM | POA: Diagnosis not present

## 2017-10-10 ENCOUNTER — Other Ambulatory Visit: Payer: Self-pay

## 2017-10-10 ENCOUNTER — Encounter (HOSPITAL_COMMUNITY): Payer: Self-pay | Admitting: Emergency Medicine

## 2017-10-10 ENCOUNTER — Ambulatory Visit (HOSPITAL_COMMUNITY)
Admission: EM | Admit: 2017-10-10 | Discharge: 2017-10-10 | Disposition: A | Discharge status: Home / Self Care | Payer: Medicare Other | Attending: Physician Assistant | Admitting: Physician Assistant

## 2017-10-10 DIAGNOSIS — J069 Acute upper respiratory infection, unspecified: Secondary | ICD-10-CM | POA: Diagnosis not present

## 2017-10-10 DIAGNOSIS — R062 Wheezing: Secondary | ICD-10-CM

## 2017-10-10 DIAGNOSIS — B9789 Other viral agents as the cause of diseases classified elsewhere: Secondary | ICD-10-CM | POA: Diagnosis not present

## 2017-10-10 MED ORDER — ALBUTEROL SULFATE HFA 108 (90 BASE) MCG/ACT IN AERS: 2.0000 | INHALATION_SPRAY | RESPIRATORY_TRACT | 1 refills | Status: AC | PRN

## 2017-10-10 NOTE — Discharge Instructions (Signed)
She may have a mild upper respiratory viral infection with mild associated wheeze. No signs of a bacterial infection at this time. May try Claritin daily for runny nose, and Delsym as directed for cough. The inhaler will help with wheeze and she may use every 6-8 hours. On a regular basis for a few days could be helpful. Certainly if becomes fatigued, fevers or emergent symptoms f/u in the ED. Hope she feels better. Push fluids.

## 2017-10-10 NOTE — ED Triage Notes (Signed)
Pt has been wheezing since yesterday with nasal congestion.  Pt was seen by neurologist yesterday and he was not concerned at the time, but they state the wheezing has gotten worse.

## 2017-10-10 NOTE — ED Provider Notes (Signed)
[facility]    CSN: [id number] Arrival date & time: 10/10/17  1237     History   Chief Complaint Chief Complaint  Patient presents with  . Wheezing    HPI Melanie Trevino is a 83 y.o. female.    Presents with some concerns of wheezing from her daughter whom is also present. She reprots a mild non-productive cough for a few days and then she noticed some wheezing that started yesterday. Patient reports feeling well withoutfevers or malaise. She has mild rhinorrhea. She has no underlying respiratory condition, but is confined to a wheelchair and has 24 hour caregivers. She has mild dementia and a CKD Stage 3.       Past Medical History:  Diagnosis Date  . Breast cancer ([place]) 08/18/2012  . Cancer ([place])   . Chronic indwelling Foley catheter 07/08/2015  . Dementia of the Alzheimer's type 07/08/2015  . 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 High cholesterol   . Hypertension   . Liver metastasis ([place]) 07/08/2015  . Renal mass 07/08/2015  . Wheelchair bound 07/08/2015    Patient Active Problem List   Diagnosis Date Noted  . CKD (chronic kidney disease), stage III ([place]) 07/30/2016  . Atrial fibrillation with RVR ([place]) 07/30/2016  . Septic shock due to urinary tract infection ([place]) 07/28/2016  . Bradycardia 04/30/2016  . Bacteremia 07/09/2015  . Dementia of the Alzheimer's type 07/08/2015  . Wheelchair bound 07/08/2015  . 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 Liver metastasis ([place]) 07/08/2015  . Renal mass 07/08/2015  . Chronic indwelling Foley catheter 07/08/2015  . Transaminitis 07/08/2015  . AKI (acute kidney injury) ([place]) 07/08/2015  . Cholelithiasis 07/08/2015  . Complicated UTI (urinary tract infection) 07/08/2015  . Anemia in neoplastic disease 07/08/2015  . Thrombocytopenia ([place]) 07/08/2015  . Coagulopathy ([place]) 07/08/2015  . Facial muscle weakness 07/08/2015  . Hypocalcemia 07/08/2015  . Muscle twitching 07/08/2015  .  Arterial hypotension   . Sepsis ([place]) 07/07/2015  . Venous stasis ulcer ([place]) 08/18/2012  . Liver cancer ([place]) 08/18/2012  . Breast cancer ([place]) 08/18/2012    Past Surgical History:  Procedure Laterality Date  . MASTECTOMY Right     OB History    No data available       Home Medications    Prior to Admission medications   Medication Sig Start Date End Date Taking? Authorizing Provider  Calcium Carb-Cholecalciferol (CALCIUM 600+D) 600-800 MG-UNIT TABS Take 1 tablet by mouth daily.   Yes [provider]  docusate sodium (COLACE) 100 MG capsule Take 100 mg by mouth 2 (two) times daily. Reported on 04/09/2016   Yes [provider]  donepezil (ARICEPT) 10 MG tablet Take 10 mg by mouth at bedtime.   Yes [provider]  fluticasone (FLONASE) 50 MCG/ACT nasal spray Place 1 spray into both nostrils daily.  07/27/12  Yes [provider]  Homeopathic Products (ARNICARE ARTHRITIS PO) Apply 1 application topically 4 (four) times daily as needed (for stiffness/pain.).   Yes [provider]  Imatinib Mesylate (GLEEVEC PO) Take 400 mg by mouth daily.    Yes [provider]  losartan (COZAAR) 25 MG tablet Take 1 tablet (25 mg total) by mouth daily. 07/31/16  Yes Robbie Lis, MD  methocarbamol (ROBAXIN) 500 MG tablet Take 500 mg by mouth every 8 (eight) hours as needed for muscle spasms.    Yes [provider]  mirabegron ER (MYRBETRIQ) 50 MG TB24 tablet Take 50  mg by mouth at bedtime.    Yes [provider]  vitamin B-12 (CYANOCOBALAMIN) 1000 MCG tablet Take 1,000 mcg by mouth 2 (two) times daily.    Yes [provider]  zinc oxide (BALMEX) 11.3 % CREA cream Apply 1 application topically 4 (four) times daily as needed (for barrier cream to buttocks area).   Yes [provider]  albuterol (PROVENTIL HFA;VENTOLIN HFA) 108 (90 Base) MCG/ACT inhaler Inhale 2 puffs into the lungs every 4 (four) hours as needed for  wheezing or shortness of breath (cough, shortness of breath or wheezing.). 10/10/17   Bjorn Pippin, PA-C  ciprofloxacin (CIPRO) 500 MG tablet Take 1 tablet (500 mg total) by mouth daily. 08/01/16   Robbie Lis, MD  Spacer/Aero-Holding Dorise Bullion Use with MDI as directed 01/17/14   Roselee Culver, MD    Family History Family History  Problem Relation Age of Onset  . Hypertension Father     Social History Social History   Tobacco Use  . Smoking status: Never Smoker  . Smokeless tobacco: Never Used  Substance Use Topics  . Alcohol use: No  . Drug use: No     Allergies   Shellfish allergy; Other; and Amoxicillin   Review of Systems Review of Systems  Constitutional: Negative for chills, fatigue and fever.  HENT: Positive for rhinorrhea. Negative for sinus pain and sore throat.   Eyes: Negative.   Respiratory: Positive for cough and wheezing. Negative for shortness of breath.   Cardiovascular: Negative.   Allergic/Immunologic: Negative.   Neurological: Negative.      Physical Exam Triage Vital Signs ED Triage Vitals [10/10/17 1414]  Enc Vitals Group     BP (!) 132/54     Pulse Rate 63     Resp      Temp 98.6 F (37 C)     Temp Source Oral     SpO2 96 %     Weight      Height      Head Circumference      Peak Flow      Pain Score      Pain Loc      Pain Edu?      Excl. in [place]?    No data found.  Updated Vital Signs BP (!) 132/54 (BP Location: Left Arm)   Pulse 63   Temp 98.6 F (37 C) (Oral)   SpO2 96%   Visual Acuity Right Eye Distance:   Left Eye Distance:   Bilateral Distance:    Right Eye Near:   Left Eye Near:    Bilateral Near:     Physical Exam  Constitutional: She is oriented to person, place, and time. She appears well-developed and well-nourished. No distress.  Comfortable in wheelchair  HENT:  Mouth/Throat: Oropharynx is clear and moist.  Cardiovascular: Normal rate and regular rhythm.  Pulmonary/Chest: Effort normal.  She has wheezes.  Very mild expiratory wheeze in the extreme bases without crackles or rales  Lymphadenopathy:    She has no cervical adenopathy.  Neurological: She is alert and oriented to person, place, and time.  Skin: Skin is warm and dry. She is not diaphoretic.  Psychiatric: Her behavior is normal.  Nursing note and vitals reviewed.    UC Treatments / Results  Labs (all labs ordered are listed, but only abnormal results are displayed) Labs Reviewed - No data to display  EKG  EKG Interpretation None       Radiology No  results found.  Procedures Procedures (including critical care time)  Medications Ordered in UC Medications - No data to display   Initial Impression / Assessment and Plan / UC Course  I have reviewed the triage vital signs and the nursing notes.  Pertinent labs & imaging results that were available during my care of the patient were reviewed by me and considered in my medical decision making (see chart for details).   No urgent exam findings of a bacterial infection or respiratory compromise at this time. No antibiotics are warranted. Suggest symptomatic care with OTC remedies that are renal friendly. Also provided a MDI for wheeze to use as needed. FU here or in ED if worsens.     Final Clinical Impressions(s) / UC Diagnoses   Final diagnoses:  Wheezing  Viral URI with cough    ED Discharge Orders        Ordered    albuterol (PROVENTIL HFA;VENTOLIN HFA) 108 (90 Base) MCG/ACT inhaler  Every 4 hours PRN     10/10/17 1451       Controlled Substance Prescriptions [place] Controlled Substance Registry consulted? Not Applicable   Bjorn Pippin, PA-C 10/10/17 1459

## 2017-10-13 ENCOUNTER — Inpatient Hospital Stay (HOSPITAL_COMMUNITY)
Admission: EM | Admit: 2017-10-13 | Discharge: 2017-11-07 | DRG: 186 | Disposition: A | Discharge status: Hospice | Payer: Medicare Other | Attending: Internal Medicine | Admitting: Internal Medicine

## 2017-10-13 ENCOUNTER — Encounter (HOSPITAL_COMMUNITY): Payer: Self-pay | Admitting: Emergency Medicine

## 2017-10-13 ENCOUNTER — Emergency Department (HOSPITAL_COMMUNITY): Payer: Medicare Other

## 2017-10-13 DIAGNOSIS — R7989 Other specified abnormal findings of blood chemistry: Secondary | ICD-10-CM | POA: Diagnosis not present

## 2017-10-13 DIAGNOSIS — I130 Hypertensive heart and chronic kidney disease with heart failure and stage 1 through stage 4 chronic kidney disease, or unspecified chronic kidney disease: Secondary | ICD-10-CM | POA: Diagnosis present

## 2017-10-13 DIAGNOSIS — Z66 Do not resuscitate: Secondary | ICD-10-CM | POA: Diagnosis not present

## 2017-10-13 DIAGNOSIS — I5033 Acute on chronic diastolic (congestive) heart failure: Secondary | ICD-10-CM | POA: Diagnosis not present

## 2017-10-13 DIAGNOSIS — E871 Hypo-osmolality and hyponatremia: Secondary | ICD-10-CM | POA: Diagnosis present

## 2017-10-13 DIAGNOSIS — C49A0 Gastrointestinal stromal tumor, unspecified site: Secondary | ICD-10-CM | POA: Diagnosis present

## 2017-10-13 DIAGNOSIS — Y92230 Patient room in hospital as the place of occurrence of the external cause: Secondary | ICD-10-CM | POA: Diagnosis not present

## 2017-10-13 DIAGNOSIS — J189 Pneumonia, unspecified organism: Secondary | ICD-10-CM

## 2017-10-13 DIAGNOSIS — Z515 Encounter for palliative care: Secondary | ICD-10-CM | POA: Diagnosis not present

## 2017-10-13 DIAGNOSIS — J9601 Acute respiratory failure with hypoxia: Secondary | ICD-10-CM | POA: Diagnosis not present

## 2017-10-13 DIAGNOSIS — R112 Nausea with vomiting, unspecified: Secondary | ICD-10-CM | POA: Diagnosis not present

## 2017-10-13 DIAGNOSIS — Z9889 Other specified postprocedural states: Secondary | ICD-10-CM | POA: Diagnosis not present

## 2017-10-13 DIAGNOSIS — R05 Cough: Secondary | ICD-10-CM | POA: Diagnosis not present

## 2017-10-13 DIAGNOSIS — L89151 Pressure ulcer of sacral region, stage 1: Secondary | ICD-10-CM | POA: Diagnosis not present

## 2017-10-13 DIAGNOSIS — Z8249 Family history of ischemic heart disease and other diseases of the circulatory system: Secondary | ICD-10-CM

## 2017-10-13 DIAGNOSIS — D696 Thrombocytopenia, unspecified: Secondary | ICD-10-CM | POA: Diagnosis present

## 2017-10-13 DIAGNOSIS — R748 Abnormal levels of other serum enzymes: Secondary | ICD-10-CM | POA: Diagnosis not present

## 2017-10-13 DIAGNOSIS — I517 Cardiomegaly: Secondary | ICD-10-CM | POA: Diagnosis not present

## 2017-10-13 DIAGNOSIS — J181 Lobar pneumonia, unspecified organism: Secondary | ICD-10-CM | POA: Diagnosis not present

## 2017-10-13 DIAGNOSIS — F0280 Dementia in other diseases classified elsewhere without behavioral disturbance: Secondary | ICD-10-CM | POA: Diagnosis present

## 2017-10-13 DIAGNOSIS — E876 Hypokalemia: Secondary | ICD-10-CM | POA: Diagnosis not present

## 2017-10-13 DIAGNOSIS — R1110 Vomiting, unspecified: Secondary | ICD-10-CM

## 2017-10-13 DIAGNOSIS — D630 Anemia in neoplastic disease: Secondary | ICD-10-CM | POA: Diagnosis present

## 2017-10-13 DIAGNOSIS — I959 Hypotension, unspecified: Secondary | ICD-10-CM | POA: Diagnosis not present

## 2017-10-13 DIAGNOSIS — G309 Alzheimer's disease, unspecified: Secondary | ICD-10-CM | POA: Diagnosis not present

## 2017-10-13 DIAGNOSIS — G510 Bell's palsy: Secondary | ICD-10-CM | POA: Diagnosis present

## 2017-10-13 DIAGNOSIS — J9811 Atelectasis: Secondary | ICD-10-CM | POA: Diagnosis not present

## 2017-10-13 DIAGNOSIS — R1310 Dysphagia, unspecified: Secondary | ICD-10-CM | POA: Diagnosis present

## 2017-10-13 DIAGNOSIS — C787 Secondary malignant neoplasm of liver and intrahepatic bile duct: Secondary | ICD-10-CM | POA: Diagnosis not present

## 2017-10-13 DIAGNOSIS — I248 Other forms of acute ischemic heart disease: Secondary | ICD-10-CM | POA: Diagnosis present

## 2017-10-13 DIAGNOSIS — E7800 Pure hypercholesterolemia, unspecified: Secondary | ICD-10-CM | POA: Diagnosis present

## 2017-10-13 DIAGNOSIS — R059 Cough, unspecified: Secondary | ICD-10-CM

## 2017-10-13 DIAGNOSIS — J80 Acute respiratory distress syndrome: Secondary | ICD-10-CM | POA: Diagnosis not present

## 2017-10-13 DIAGNOSIS — J90 Pleural effusion, not elsewhere classified: Principal | ICD-10-CM | POA: Diagnosis present

## 2017-10-13 DIAGNOSIS — L8990 Pressure ulcer of unspecified site, unspecified stage: Secondary | ICD-10-CM

## 2017-10-13 DIAGNOSIS — Z880 Allergy status to penicillin: Secondary | ICD-10-CM

## 2017-10-13 DIAGNOSIS — I480 Paroxysmal atrial fibrillation: Secondary | ICD-10-CM | POA: Diagnosis present

## 2017-10-13 DIAGNOSIS — R32 Unspecified urinary incontinence: Secondary | ICD-10-CM | POA: Diagnosis present

## 2017-10-13 DIAGNOSIS — Z7189 Other specified counseling: Secondary | ICD-10-CM | POA: Diagnosis not present

## 2017-10-13 DIAGNOSIS — Z887 Allergy status to serum and vaccine status: Secondary | ICD-10-CM

## 2017-10-13 DIAGNOSIS — T501X5A Adverse effect of loop [high-ceiling] diuretics, initial encounter: Secondary | ICD-10-CM | POA: Diagnosis not present

## 2017-10-13 DIAGNOSIS — Z91013 Allergy to seafood: Secondary | ICD-10-CM

## 2017-10-13 DIAGNOSIS — C50919 Malignant neoplasm of unspecified site of unspecified female breast: Secondary | ICD-10-CM | POA: Diagnosis present

## 2017-10-13 DIAGNOSIS — B373 Candidiasis of vulva and vagina: Secondary | ICD-10-CM | POA: Diagnosis not present

## 2017-10-13 DIAGNOSIS — N183 Chronic kidney disease, stage 3 (moderate): Secondary | ICD-10-CM | POA: Diagnosis present

## 2017-10-13 DIAGNOSIS — D649 Anemia, unspecified: Secondary | ICD-10-CM | POA: Diagnosis not present

## 2017-10-13 DIAGNOSIS — Z79899 Other long term (current) drug therapy: Secondary | ICD-10-CM

## 2017-10-13 DIAGNOSIS — Z9011 Acquired absence of right breast and nipple: Secondary | ICD-10-CM

## 2017-10-13 DIAGNOSIS — R778 Other specified abnormalities of plasma proteins: Secondary | ICD-10-CM

## 2017-10-13 DIAGNOSIS — I482 Chronic atrial fibrillation: Secondary | ICD-10-CM

## 2017-10-13 DIAGNOSIS — R069 Unspecified abnormalities of breathing: Secondary | ICD-10-CM | POA: Diagnosis not present

## 2017-10-13 DIAGNOSIS — J690 Pneumonitis due to inhalation of food and vomit: Secondary | ICD-10-CM | POA: Diagnosis not present

## 2017-10-13 DIAGNOSIS — R0902 Hypoxemia: Secondary | ICD-10-CM

## 2017-10-13 DIAGNOSIS — Z853 Personal history of malignant neoplasm of breast: Secondary | ICD-10-CM

## 2017-10-13 DIAGNOSIS — Z87892 Personal history of anaphylaxis: Secondary | ICD-10-CM

## 2017-10-13 DIAGNOSIS — R0602 Shortness of breath: Secondary | ICD-10-CM | POA: Diagnosis not present

## 2017-10-13 DIAGNOSIS — Z993 Dependence on wheelchair: Secondary | ICD-10-CM

## 2017-10-13 DIAGNOSIS — R846 Abnormal cytological findings in specimens from respiratory organs and thorax: Secondary | ICD-10-CM | POA: Diagnosis not present

## 2017-10-13 DIAGNOSIS — R062 Wheezing: Secondary | ICD-10-CM

## 2017-10-13 LAB — URINALYSIS, ROUTINE W REFLEX MICROSCOPIC
BILIRUBIN URINE: NEGATIVE
Glucose, UA: NEGATIVE mg/dL
Ketones, ur: 5 mg/dL — AB
Leukocytes, UA: NEGATIVE
NITRITE: NEGATIVE
PH: 5 (ref 5.0–8.0)
Protein, ur: NEGATIVE mg/dL
SPECIFIC GRAVITY, URINE: 1.013 (ref 1.005–1.030)

## 2017-10-13 LAB — CBC WITH DIFFERENTIAL/PLATELET
BASOS ABS: 0 10*3/uL (ref 0.0–0.1)
Basophils Relative: 0 %
EOS ABS: 0 10*3/uL (ref 0.0–0.7)
EOS PCT: 0 %
HCT: 21.2 % — ABNORMAL LOW (ref 36.0–46.0)
Hemoglobin: 7.2 g/dL — ABNORMAL LOW (ref 12.0–15.0)
LYMPHS ABS: 0.1 10*3/uL — AB (ref 0.7–4.0)
Lymphocytes Relative: 3 %
MCH: 33.3 pg (ref 26.0–34.0)
MCHC: 34 g/dL (ref 30.0–36.0)
MCV: 98.1 fL (ref 78.0–100.0)
Monocytes Absolute: 0.2 10*3/uL (ref 0.1–1.0)
Monocytes Relative: 5 %
Neutro Abs: 3 10*3/uL (ref 1.7–7.7)
Neutrophils Relative %: 92 %
PLATELETS: 140 10*3/uL — AB (ref 150–400)
RBC: 2.16 MIL/uL — AB (ref 3.87–5.11)
RDW: 14.9 % (ref 11.5–15.5)
WBC: 3.3 10*3/uL — AB (ref 4.0–10.5)

## 2017-10-13 LAB — COMPREHENSIVE METABOLIC PANEL
ALK PHOS: 66 U/L (ref 38–126)
ALT: 16 U/L (ref 14–54)
AST: 48 U/L — AB (ref 15–41)
Albumin: 3.3 g/dL — ABNORMAL LOW (ref 3.5–5.0)
Anion gap: 10 (ref 5–15)
BILIRUBIN TOTAL: 0.7 mg/dL (ref 0.3–1.2)
BUN: 15 mg/dL (ref 6–20)
CALCIUM: 8.4 mg/dL — AB (ref 8.9–10.3)
CO2: 24 mmol/L (ref 22–32)
CREATININE: 0.74 mg/dL (ref 0.44–1.00)
Chloride: 96 mmol/L — ABNORMAL LOW (ref 101–111)
GFR calc Af Amer: >60 mL/min (ref >60)
Glucose, Bld: 126 mg/dL — ABNORMAL HIGH (ref 65–99)
Potassium: 3.9 mmol/L (ref 3.5–5.1)
Sodium: 130 mmol/L — ABNORMAL LOW (ref 135–145)
TOTAL PROTEIN: 5.6 g/dL — AB (ref 6.5–8.1)

## 2017-10-13 LAB — BRAIN NATRIURETIC PEPTIDE: B Natriuretic Peptide: 512.1 pg/mL — ABNORMAL HIGH (ref 0.0–100.0)

## 2017-10-13 LAB — TSH: TSH: 0.919 u[IU]/mL (ref 0.350–4.500)

## 2017-10-13 LAB — POC OCCULT BLOOD, ED: Fecal Occult Bld: NEGATIVE

## 2017-10-13 LAB — TROPONIN I: TROPONIN I: 0.67 ng/mL — AB (ref <0.03)

## 2017-10-13 MED ORDER — CEFTRIAXONE SODIUM 1 G IJ SOLR
1.0000 g | Freq: Once | INTRAMUSCULAR | Status: AC
  Administered 2017-10-13: 1 g via INTRAVENOUS
  Filled 2017-10-13: qty 10

## 2017-10-13 MED ORDER — IPRATROPIUM-ALBUTEROL 0.5-2.5 (3) MG/3ML IN SOLN
3.0000 mL | Freq: Once | RESPIRATORY_TRACT | Status: AC
  Administered 2017-10-13: 3 mL via RESPIRATORY_TRACT
  Filled 2017-10-13: qty 3

## 2017-10-13 MED ORDER — SODIUM CHLORIDE 0.9 % IV SOLN
10.0000 mL/h | Freq: Once | INTRAVENOUS | Status: AC
  Administered 2017-10-14: 10 mL/h via INTRAVENOUS

## 2017-10-13 MED ORDER — IPRATROPIUM-ALBUTEROL 0.5-2.5 (3) MG/3ML IN SOLN
3.0000 mL | Freq: Four times a day (QID) | RESPIRATORY_TRACT | Status: DC
  Administered 2017-10-14 – 2017-10-15 (×7): 3 mL via RESPIRATORY_TRACT
  Filled 2017-10-13 (×7): qty 3

## 2017-10-13 MED ORDER — AZITHROMYCIN 250 MG PO TABS
500.0000 mg | ORAL_TABLET | Freq: Once | ORAL | Status: AC
  Administered 2017-10-13: 500 mg via ORAL
  Filled 2017-10-13: qty 2

## 2017-10-13 MED ORDER — LEVOFLOXACIN IN D5W 750 MG/150ML IV SOLN
750.0000 mg | INTRAVENOUS | Status: DC
  Administered 2017-10-14: 750 mg via INTRAVENOUS
  Filled 2017-10-13 (×2): qty 150

## 2017-10-13 NOTE — ED Notes (Signed)
Pt just urinated in [place]; about 67mLs.  PVR bladder scan: 38mLs

## 2017-10-13 NOTE — H&P (Signed)
History and Physical    Melanie Trevino TFT:[id number] DOB: 1928/03/06 DOA: 10/13/2017  PCP: Wenda Low, MD Patient coming from: home  Chief Complaint: sob and generalized weakness   HPI: Melanie Trevino is a 83 y.o. 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  Due to patient's dementia she is a poor historian therefore history per the family (daughter in laws and son) and the caregiver who were at bedside.  Per the family patient started wheezing last Friday and bringing up clear cough with "gurgling" sounds from her chest.  Due to this she was taken to an urgent care the following day where she was prescribed inhalers and discharged home.  No antibiotics or chest x-ray was done at that time.  After going home patient received inhaler every 4 hours and antitussives twice daily.  Despite of this she continued to feel weaker and weaker with shortness of breath with minimal exertion.  Mostly she is wheelchair-bound but even during the transfer from wheelchair to the bed she would get short of breath.  Had some subjective chills but denies any fevers and other complaints.  For the last 2 days her appetite has been very poor as well.  Family went out and bought a pulse ox which showed 70% on room air therefore brought to the ER for further evaluation.  Family denied any history of lower GI bleed or any other signs of blood loss.  Denies any recent sick contacts or illness.  In the ER patient was noted to have diminished breath sounds on the right side with oxygen saturation of 91% on room air.  Her labs showed sodium of 130, troponin 0 0.67, hemoglobin 7.2 (down from baseline 9.6).  UA was clear.  Chest  x-ray showed a right sided large pleural effusion.  Hemoccult negative.  It was determined to admit her for further evaluation.  The family confirms that patient is a full code. Family denies any history of smoking and patient.  Review of Systems: As per HPI otherwise 10 point review of systems negative.   Past Medical History:  Diagnosis Date  . Breast cancer ([place]) 08/18/2012  . Cancer ([employer])   . Chronic indwelling Foley catheter 07/08/2015  . Dementia of the Alzheimer's type 07/08/2015  . Gastrointestinal stromal tumor (GIST) ([place]) 07/08/2015  . High cholesterol   . Hypertension   . Liver metastasis ([place]) 07/08/2015  . Renal mass 07/08/2015  . Wheelchair bound 07/08/2015    Past Surgical History:  Procedure Laterality Date  . MASTECTOMY Right      reports that  has never smoked. she has never used smokeless tobacco. She reports that she does not drink alcohol or use drugs.  Allergies  Allergen Reactions  . Shellfish Allergy Anaphylaxis  . Other Other (See Comments)    HORSE SERUMYDrug[Other] swelling6/09/2006 12:00:00 AM by Erin Hearing CPhT  . Amoxicillin Swelling and Rash    Has patient had a PCN reaction causing immediate rash, facial/tongue/throat swelling, SOB or lightheadedness with hypotension: UNKNOWN Has patient had a PCN reaction causing severe rash involving mucus membranes or skin necrosis: Unknown Has patient had a PCN reaction that required hospitalization: Unknown Has patient had a PCN reaction occurring within the last 10 years: Unknown If all of the  above answers are "NO", then may proceed with Cephalosporin use.     Family History  Problem Relation Age of Onset  . Hypertension Father     Acceptable: Family history reviewed and not pertinent (If you reviewed it)  Prior to Admission medications   Medication Sig Start Date End Date Taking? Authorizing Provider  albuterol (PROVENTIL HFA;VENTOLIN HFA) 108 (90 Base) MCG/ACT inhaler Inhale 2 puffs into the lungs  every 4 (four) hours as needed for wheezing or shortness of breath (cough, shortness of breath or wheezing.). 10/10/17  Yes Bjorn Pippin, PA-C  Calcium Carb-Cholecalciferol (CALCIUM 600+D) 600-800 MG-UNIT TABS Take 1 tablet by mouth daily.   Yes [provider]  docusate sodium (COLACE) 100 MG capsule Take 100 mg by mouth 2 (two) times daily. Reported on 04/09/2016   Yes [provider]  donepezil (ARICEPT) 10 MG tablet Take 10 mg by mouth at bedtime.   Yes [provider]  fluticasone (FLONASE) 50 MCG/ACT nasal spray Place 1 spray into both nostrils daily.  07/27/12  Yes [provider]  GLEEVEC 400 MG tablet Take 400 mg by mouth daily. 09/19/17  Yes [provider]  Homeopathic Products (ARNICARE ARTHRITIS PO) Apply 1 application topically 4 (four) times daily as needed (for stiffness/pain.).   Yes [provider]  losartan (COZAAR) 25 MG tablet Take 1 tablet (25 mg total) by mouth daily. 07/31/16  Yes Robbie Lis, MD  methocarbamol (ROBAXIN) 500 MG tablet Take 500 mg by mouth every 8 (eight) hours as needed for muscle spasms.    Yes [provider]  mirabegron ER (MYRBETRIQ) 50 MG TB24 tablet Take 50 mg by mouth at bedtime.    Yes [provider]  vitamin B-12 (CYANOCOBALAMIN) 1000 MCG tablet Take 1,000 mcg by mouth 2 (two) times daily.    Yes [provider]  zinc oxide (BALMEX) 11.3 % CREA cream Apply 1 application topically 4 (four) times daily as needed (for barrier cream to buttocks area).   Yes [provider]  ciprofloxacin (CIPRO) 500 MG tablet Take 1 tablet (500 mg total) by mouth daily. Patient not taking: Reported on 10/13/2017 08/01/16   Robbie Lis, MD  Spacer/Aero-Holding Dorise Bullion Use with MDI as directed 01/17/14   Roselee Culver, MD    Physical Exam: Vitals:   10/13/17 1700 10/13/17 1730 10/13/17 1830 10/13/17 1900  BP: (!) 149/72 (!) 146/65 (!) 161/82 (!) 153/82  Pulse: 65  60 68 70  Resp: (!) 22 16 17 20   Temp:      TempSrc:      SpO2: 91% 93% 100% 93%      Constitutional: NAD, calm, comfortable Vitals:   10/13/17 1700 10/13/17 1730 10/13/17 1830 10/13/17 1900  BP: (!) 149/72 (!) 146/65 (!) 161/82 (!) 153/82  Pulse: 65 60 68 70  Resp: (!) 22 16 17 20   Temp:      TempSrc:      SpO2: 91% 93% 100% 93%   Eyes: PERRL, lids and conjunctivae normal ENMT: Mucous membranes are moist. Posterior pharynx clear of any exudate or lesions.Normal dentition.  Neck: normal, supple, no masses, no thyromegaly Respiratory: Diminished breath sounds on the right side Cardiovascular: Regular rate and rhythm, no murmurs / rubs / gallops. No extremity edema. 2+ pedal pulses. No carotid bruits.  Abdomen: + ventral abd hernia noted, no tenderness, no masses palpated. No hepatosplenomegaly. Bowel sounds positive.  Musculoskeletal: no clubbing / cyanosis. No joint deformity upper and lower extremities.  Good ROM, no contractures. Normal muscle tone.  Skin: no rashes, lesions, ulcers. No induration Neurologic: CN 2-12 grossly intact. Sensation intact, DTR normal. Strength 5/5 in all 4.  Psychiatric: Normal judgment and insight. Alert and oriented x 2 (at baseline). Normal mood.     Labs on Admission: I have personally reviewed following labs and imaging studies  CBC: Recent Labs  Lab 10/13/17 1750  WBC 3.3*  NEUTROABS 3.0  HGB 7.2*  HCT 21.2*  MCV 98.1  PLT 491*   Basic Metabolic Panel: Recent Labs  Lab 10/13/17 1750  NA 130*  K 3.9  CL 96*  CO2 24  GLUCOSE 126*  BUN 15  CREATININE 0.74  CALCIUM 8.4*   GFR: CrCl cannot be calculated (Unknown ideal weight.). Liver Function Tests: Recent Labs  Lab 10/13/17 1750  AST 48*  ALT 16  ALKPHOS 66  BILITOT 0.7  PROT 5.6*  ALBUMIN 3.3*   No results for input(s): LIPASE, AMYLASE in the last 168 hours. No results for input(s): AMMONIA in the last 168 hours. Coagulation Profile: No results for input(s):  INR, PROTIME in the last 168 hours. Cardiac Enzymes: Recent Labs  Lab 10/13/17 1750  TROPONINI 0.67*   BNP (last 3 results) No results for input(s): PROBNP in the last 8760 hours. HbA1C: No results for input(s): HGBA1C in the last 72 hours. CBG: No results for input(s): GLUCAP in the last 168 hours. Lipid Profile: No results for input(s): CHOL, HDL, LDLCALC, TRIG, CHOLHDL, LDLDIRECT in the last 72 hours. Thyroid Function Tests: No results for input(s): TSH, T4TOTAL, FREET4, T3FREE, THYROIDAB in the last 72 hours. Anemia Panel: No results for input(s): VITAMINB12, FOLATE, FERRITIN, TIBC, IRON, RETICCTPCT in the last 72 hours. Urine analysis:    Component Value Date/Time   COLORURINE YELLOW 10/13/2017 1918   APPEARANCEUR CLEAR 10/13/2017 1918   LABSPEC 1.013 10/13/2017 1918   PHURINE 5.0 10/13/2017 1918   GLUCOSEU NEGATIVE 10/13/2017 1918   HGBUR SMALL (A) 10/13/2017 1918   BILIRUBINUR NEGATIVE 10/13/2017 1918   BILIRUBINUR negative 04/05/2016 1414   KETONESUR 5 (A) 10/13/2017 1918   PROTEINUR NEGATIVE 10/13/2017 1918   UROBILINOGEN 0.2 04/05/2016 1414   UROBILINOGEN 1.0 07/07/2015 1901   NITRITE NEGATIVE 10/13/2017 1918   LEUKOCYTESUR NEGATIVE 10/13/2017 1918   Sepsis Labs: !!!!!!!!!!!!!!!!!!!!!!!!!!!!!!!!!!!!!!!!!!!! @LABRCNTIP (procalcitonin:4,lacticidven:4) )No results found for this or any previous visit (from the past 240 hour(s)).   Radiological Exams on Admission: Dg Chest 2 View  Result Date: 10/13/2017 CLINICAL DATA:  83 year old female with wheezing and productive cough. Initial encounter. EXAM: CHEST  2 VIEW COMPARISON:  07/28/2016. FINDINGS: Large right-sided pleural effusion. Suspect associated right middle lobe and right lower lobe atelectasis. Cannot exclude lower lobe infiltrate or mass. Cardiomegaly. Pulmonary vascular prominence greater centrally. Calcified tortuous aorta. Bilateral shoulder joint degenerative changes. Surgical clips right thyroid bed  region. IMPRESSION: Large right-sided pleural effusion. Suspect associated right middle lobe and right lower lobe atelectasis. Cannot exclude lower lobe infiltrate or mass. Cardiomegaly. Aortic Atherosclerosis (ICD10-I70.0). Electronically Signed   By: Genia Del M.D.   On: 10/13/2017 18:23    EKG: Independently reviewed.  No acute ST-T changes  Assessment/Plan Active Problems:   * No active hospital problems. *    Acute hypoxic respiratory distress, 70% on room air Large right-sided pleural effusion, unknown etiology Possible underlying pneumonia - Admit to inpatient telemetry for further evaluation - UA is negative, patient given Rocephin and ceftriaxone but blood cultures not done in the ER yet.  Will order blood cultures  and sputum culture -Bronchodilators as needed, supplemental oxygen -Pleural effusion etiology could be multifactorial given her history (malignancy, elevated trop vs PNA) -transudate versus exudate.  -N.p.o. past midnight, will need IR guided thoracentesis-orders placed for tomorrow morning.  Pleural effusion labs placed as well including cultures - Will order echocardiogram -Respiratory issue currently appears to be stable, holding off on Lasix due to soft blood pressure SBP low 90-100s. If improves, will give her lasix.  - Although doesn't appears as sick for the level of effusion plus doesn't have leucocytosis, will order IV levaquin.   Anemia -Does have chronic anemia with baseline hemoglobin around 9 but this is lower than her baseline.  Hemoglobin today 7.2 without any active signs of bleeding, Hemoccult negative in the ER -We will order type and screen, check iron studies, ferritin, B12, folate and TSH - Low threshold for transfusion especially in the setting of elevated troponin.  Elevated troponin -Troponin first set is 0.67, EKG is negative for any acute ST-T changes.  Suspect this is type II demand ischemia from the large effusion.  Echocardiogram from  last year shows ejection fraction 55% with moderate LVH. -She does not have any active chest pain, will order repeat echocardiogram and trend enzymes  Hyponatremia - This appears to be chronic.  No further workup at this time.  History of atrial fibrillation - Previously determined not on anticoagulation due to her history of thrombocytopenia.  GIST with Liver Mets -Follows at [facility], per family this is stable.  She is on Gleevec  History of acoustic neuroma -Resected in 1995  History of breast cancer status post mastectomy -Currently appears to be in remission  Essential hypertension - Currently borderline low blood pressure.  Will closely monitor.  Will treat with IV medications as needed  Alzheimer's dementia -Continue home medications.   DVT prophylaxis: SCDs for now, plans for Thoracentesis for tomorrow Code Status: Full Family Communication: Son and daughter-in-law is at bedside Disposition Plan: To be determined Consults called: None Admission status: Inpatient telemetry   Ankit Arsenio Loader MD [facility]   If 7PM-7AM, please contact night-coverage [email or web address] Password [facility]  10/13/2017, 7:56 PM

## 2017-10-13 NOTE — Progress Notes (Signed)
Pharmacy Antibiotic Note  Melanie Trevino is a 83 y.o. female admitted on 10/13/2017 with possible PNA.  Pharmacy has been consulted for levaquin dosing. She was given Rocephin 1 gm and azithromycin 500 mg in the ED.  Cr 0.74. WBC low 3.3. AF. UA - nitrate neg, few bacteria. No weight in Epic.   Plan: Levaquin 750 mg IV q48 hrs- to start tomorrow 12/12 at 2000 2nd to pt got azithromycin today at 2016 to avoid prolonging QT interval.    Temp (24hrs), Avg:97.9 F (36.6 C), Min:97.9 F (36.6 C), Max:97.9 F (36.6 C)  Recent Labs  Lab 10/13/17 1750  WBC 3.3*  CREATININE 0.74    CrCl cannot be calculated (Unknown ideal weight.).    Allergies  Allergen Reactions  . Shellfish Allergy Anaphylaxis  . Other Other (See Comments)    HORSE SERUMYDrug[Other] swelling6/09/2006 12:00:00 AM by Erin Hearing CPhT  . Amoxicillin Swelling and Rash    Has patient had a PCN reaction causing immediate rash, facial/tongue/throat swelling, SOB or lightheadedness with hypotension: UNKNOWN Has patient had a PCN reaction causing severe rash involving mucus membranes or skin necrosis: Unknown Has patient had a PCN reaction that required hospitalization: Unknown Has patient had a PCN reaction occurring within the last 10 years: Unknown If all of the above answers are "NO", then may proceed with Cephalosporin use.     Thank you for allowing pharmacy to be a part of this patient's care. Eudelia Bunch, Pharm.D. [phone] 10/13/2017 9:29 PM

## 2017-10-13 NOTE — ED Notes (Signed)
Date and time results received: 10/13/17 6:48 PM    Test: troponin Critical Value: 0.67  Name of Provider Notified: MD Gilford Raid  Orders Received? Or Actions Taken?: waiting orders

## 2017-10-13 NOTE — ED Provider Notes (Signed)
[facility] DEPT Provider Note   CSN: [id number] Arrival date & time: 10/13/17  1630     History   Chief Complaint Chief Complaint  Patient presents with  . Respiratory Distress    HPI Melanie Trevino is a 83 y.o. female.  Pt presents to the ED today with sob.  Pt has had sob for the past few days.  She was seen at [facility] on 12/8 for the same and was diagnosed with URI and given an inhaler.  Pt's breathing has worsened.  Pt has dementia and is a poor historian.  Daughters give history.  Pt's daughter checked on her today and said her O2 sat was only in the 70s when she arrived at the SNF where pt stays.  O2 sat has come up to the upper 80s after 2 nebs and 125 mg solumedrol given by EMS.        Past Medical History:  Diagnosis Date  . Breast cancer ([place]) 08/18/2012  . Cancer ([place])   . Chronic indwelling Foley catheter 07/08/2015  . Dementia of the Alzheimer's type 07/08/2015  . Gastrointestinal stromal tumor (GIST) ([place]) 07/08/2015  . High cholesterol   . Hypertension   . Liver metastasis ([place]) 07/08/2015  . Renal mass 07/08/2015  . Wheelchair bound 07/08/2015    Patient Active Problem List   Diagnosis Date Noted  . CKD (chronic kidney disease), stage III ([place]) 07/30/2016  . Atrial fibrillation with RVR ([place]) 07/30/2016  . Septic shock due to urinary tract infection ([place]) 07/28/2016  . Bradycardia 04/30/2016  . Bacteremia 07/09/2015  . Dementia of the Alzheimer's type 07/08/2015  . Wheelchair bound 07/08/2015  . Gastrointestinal stromal tumor (GIST) ([place]) 07/08/2015  . Liver metastasis ([place]) 07/08/2015  . Renal mass 07/08/2015  . Chronic indwelling Foley catheter 07/08/2015  . Transaminitis 07/08/2015  . AKI (acute kidney injury) ([place]) 07/08/2015  . Cholelithiasis 07/08/2015  . Complicated UTI (urinary tract infection) 07/08/2015  . Anemia in neoplastic disease 07/08/2015  . Thrombocytopenia ([place]) 07/08/2015  . Coagulopathy ([place]) 07/08/2015  .  Facial muscle weakness 07/08/2015  . Hypocalcemia 07/08/2015  . Muscle twitching 07/08/2015  . Arterial hypotension   . Sepsis ([place]) 07/07/2015  . Venous stasis ulcer ([place]) 08/18/2012  . Liver cancer ([place]) 08/18/2012  . Breast cancer ([place]) 08/18/2012    Past Surgical History:  Procedure Laterality Date  . MASTECTOMY Right     OB History    No data available       Home Medications    Prior to Admission medications   Medication Sig Start Date End Date Taking? Authorizing Provider  albuterol (PROVENTIL HFA;VENTOLIN HFA) 108 (90 Base) MCG/ACT inhaler Inhale 2 puffs into the lungs every 4 (four) hours as needed for wheezing or shortness of breath (cough, shortness of breath or wheezing.). 10/10/17  Yes Bjorn Pippin, PA-C  Calcium Carb-Cholecalciferol (CALCIUM 600+D) 600-800 MG-UNIT TABS Take 1 tablet by mouth daily.   Yes [provider]  docusate sodium (COLACE) 100 MG capsule Take 100 mg by mouth 2 (two) times daily. Reported on 04/09/2016   Yes [provider]  donepezil (ARICEPT) 10 MG tablet Take 10 mg by mouth at bedtime.   Yes [provider]  fluticasone (FLONASE) 50 MCG/ACT nasal spray Place 1 spray into both nostrils daily.  07/27/12  Yes [provider]  GLEEVEC 400 MG tablet Take 400 mg by mouth daily. 09/19/17  Yes [provider]  Homeopathic Products ([place]) Apply  1 application topically 4 (four) times daily as needed (for stiffness/pain.).   Yes [provider]  losartan (COZAAR) 25 MG tablet Take 1 tablet (25 mg total) by mouth daily. 07/31/16  Yes Robbie Lis, MD  methocarbamol (ROBAXIN) 500 MG tablet Take 500 mg by mouth every 8 (eight) hours as needed for muscle spasms.    Yes [provider]  mirabegron ER (MYRBETRIQ) 50 MG TB24 tablet Take 50 mg by mouth at bedtime.    Yes [provider]  vitamin B-12 (CYANOCOBALAMIN) 1000 MCG tablet Take 1,000 mcg by mouth 2 (two) times  daily.    Yes [provider]  zinc oxide (BALMEX) 11.3 % CREA cream Apply 1 application topically 4 (four) times daily as needed (for barrier cream to buttocks area).   Yes [provider]  ciprofloxacin (CIPRO) 500 MG tablet Take 1 tablet (500 mg total) by mouth daily. Patient not taking: Reported on 10/13/2017 08/01/16   Robbie Lis, MD  Spacer/Aero-Holding Dorise Bullion Use with MDI as directed 01/17/14   Roselee Culver, MD    Family History Family History  Problem Relation Age of Onset  . Hypertension Father     Social History Social History   Tobacco Use  . Smoking status: Never Smoker  . Smokeless tobacco: Never Used  Substance Use Topics  . Alcohol use: No  . Drug use: No     Allergies   Shellfish allergy; Other; and Amoxicillin   Review of Systems Review of Systems  Respiratory: Positive for cough, shortness of breath and wheezing.   All other systems reviewed and are negative.    Physical Exam Updated Vital Signs BP (!) 153/82   Pulse 70   Temp 97.9 F (36.6 C) (Oral)   Resp 20   SpO2 93%   Physical Exam  Constitutional: She appears well-developed. She appears distressed.  HENT:  Head: Normocephalic and atraumatic.  Right Ear: External ear normal.  Left Ear: External ear normal.  Nose: Nose normal.  Mouth/Throat: Oropharynx is clear and moist.  Eyes: Conjunctivae and EOM are normal. Pupils are equal, round, and reactive to light.  Neck: Normal range of motion. Neck supple.  Cardiovascular: Normal rate, regular rhythm, normal heart sounds and intact distal pulses.  Pulmonary/Chest: She has wheezes.  Abdominal: Soft. Bowel sounds are normal.  Genitourinary: Rectal exam shows guaiac negative stool.  Genitourinary Comments: Pt no longer has an indwelling catheter (urology took it out 5 months ago)  Musculoskeletal: Normal range of motion.  Neurological: She is alert.  Oriented to person  Nursing note and vitals  reviewed.    ED Treatments / Results  Labs (all labs ordered are listed, but only abnormal results are displayed) Labs Reviewed  COMPREHENSIVE METABOLIC PANEL - Abnormal; Notable for the following components:      Result Value   Sodium 130 (*)    Chloride 96 (*)    Glucose, Bld 126 (*)    Calcium 8.4 (*)    Total Protein 5.6 (*)    Albumin 3.3 (*)    AST 48 (*)    All other components within normal limits  CBC WITH DIFFERENTIAL/PLATELET - Abnormal; Notable for the following components:   WBC 3.3 (*)    RBC 2.16 (*)    Hemoglobin 7.2 (*)    HCT 21.2 (*)    Platelets 140 (*)    Lymphs Abs 0.1 (*)    All other components within normal limits  TROPONIN I -  Abnormal; Notable for the following components:   Troponin I 0.67 (*)    All other components within normal limits  BRAIN NATRIURETIC PEPTIDE - Abnormal; Notable for the following components:   B Natriuretic Peptide 512.1 (*)    All other components within normal limits  URINALYSIS, ROUTINE W REFLEX MICROSCOPIC  POC OCCULT BLOOD, ED  PREPARE RBC (CROSSMATCH)    EKG  EKG Interpretation  Date/Time:  Tuesday October 13 2017 16:54:01 EST Ventricular Rate:  68 PR Interval:    QRS Duration: 105 QT Interval:  423 QTC Calculation: 450 R Axis:   -35 Text Interpretation:  Junctional rhythm Left axis deviation Anterior infarct, old Confirmed by Isla Pence [phone]) on 10/13/2017 5:37:39 PM       Radiology Dg Chest 2 View  Result Date: 10/13/2017 CLINICAL DATA:  83 year old female with wheezing and productive cough. Initial encounter. EXAM: CHEST  2 VIEW COMPARISON:  07/28/2016. FINDINGS: Large right-sided pleural effusion. Suspect associated right middle lobe and right lower lobe atelectasis. Cannot exclude lower lobe infiltrate or mass. Cardiomegaly. Pulmonary vascular prominence greater centrally. Calcified tortuous aorta. Bilateral shoulder joint degenerative changes. Surgical clips right thyroid bed region.  IMPRESSION: Large right-sided pleural effusion. Suspect associated right middle lobe and right lower lobe atelectasis. Cannot exclude lower lobe infiltrate or mass. Cardiomegaly. Aortic Atherosclerosis (ICD10-I70.0). Electronically Signed   By: Genia Del M.D.   On: 10/13/2017 18:23    Procedures Procedures (including critical care time)  Medications Ordered in ED Medications  cefTRIAXone (ROCEPHIN) 1 g in dextrose 5 % 50 mL IVPB (not administered)  azithromycin (ZITHROMAX) tablet 500 mg (not administered)  0.9 %  sodium chloride infusion (not administered)  ipratropium-albuterol (DUONEB) 0.5-2.5 (3) MG/3ML nebulizer solution 3 mL (3 mLs Nebulization Given 10/13/17 1817)     Initial Impression / Assessment and Plan / ED Course  I have reviewed the triage vital signs and the nursing notes.  Pertinent labs & imaging results that were available during my care of the patient were reviewed by me and considered in my medical decision making (see chart for details).   Pt placed on oxygen and given nebs.  She is feeling a little better.  Because her hemoglobin is so low, I ordered blood for transfusion.  The pt also given Abx for possible CAP.  Low sodium and elevated troponin looks chronic.  The pt d/w Dr. Reesa Chew ([facility]) for admission.   Final Clinical Impressions(s) / ED Diagnoses   Final diagnoses:  Symptomatic anemia  Hypoxia  Pleural effusion  Elevated troponin  Community acquired pneumonia of right lower lobe of lung ([place])  Hyponatremia    ED Discharge Orders    None       Isla Pence, MD 10/13/17 1933

## 2017-10-13 NOTE — ED Triage Notes (Signed)
Pt comes from home via EMS with recurrent complaints of a URI.  Was seen at [facility] on 12/8 for same.  Pt was not febrile at the time so was not placed on any antibiotics. Was prescribed inhaler. Pt still wheezing with productive cough.  Hx of dementia. Given 125 of solumedrol with 2 breathing treatments in route. No chronic oxygen use.

## 2017-10-13 NOTE — ED Notes (Signed)
Patient transported to X-ray 

## 2017-10-14 ENCOUNTER — Inpatient Hospital Stay (HOSPITAL_COMMUNITY): Payer: Medicare Other

## 2017-10-14 ENCOUNTER — Other Ambulatory Visit: Payer: Self-pay

## 2017-10-14 DIAGNOSIS — I517 Cardiomegaly: Secondary | ICD-10-CM

## 2017-10-14 LAB — COMPREHENSIVE METABOLIC PANEL
ALBUMIN: 3 g/dL — AB (ref 3.5–5.0)
ALT: 15 U/L (ref 14–54)
AST: 43 U/L — AB (ref 15–41)
Alkaline Phosphatase: 62 U/L (ref 38–126)
Anion gap: 7 (ref 5–15)
BILIRUBIN TOTAL: 0.9 mg/dL (ref 0.3–1.2)
BUN: 14 mg/dL (ref 6–20)
CHLORIDE: 98 mmol/L — AB (ref 101–111)
CO2: 27 mmol/L (ref 22–32)
CREATININE: 0.77 mg/dL (ref 0.44–1.00)
Calcium: 8.2 mg/dL — ABNORMAL LOW (ref 8.9–10.3)
GFR calc Af Amer: >60 mL/min (ref >60)
GLUCOSE: 111 mg/dL — AB (ref 65–99)
POTASSIUM: 4.1 mmol/L (ref 3.5–5.1)
Sodium: 132 mmol/L — ABNORMAL LOW (ref 135–145)
Total Protein: 5.3 g/dL — ABNORMAL LOW (ref 6.5–8.1)

## 2017-10-14 LAB — LACTATE DEHYDROGENASE, PLEURAL OR PERITONEAL FLUID: LD, Fluid: 190 U/L — ABNORMAL HIGH (ref 3–23)

## 2017-10-14 LAB — GLUCOSE, PLEURAL OR PERITONEAL FLUID: GLUCOSE FL: 104 mg/dL

## 2017-10-14 LAB — PROTIME-INR
INR: 1.25
Prothrombin Time: 15.6 seconds — ABNORMAL HIGH (ref 11.4–15.2)

## 2017-10-14 LAB — LACTATE DEHYDROGENASE: LDH: 257 U/L — ABNORMAL HIGH (ref 98–192)

## 2017-10-14 LAB — CBC
HEMATOCRIT: 29.2 % — AB (ref 36.0–46.0)
Hemoglobin: 10 g/dL — ABNORMAL LOW (ref 12.0–15.0)
MCH: 32.1 pg (ref 26.0–34.0)
MCHC: 34.2 g/dL (ref 30.0–36.0)
MCV: 93.6 fL (ref 78.0–100.0)
PLATELETS: 147 10*3/uL — AB (ref 150–400)
RBC: 3.12 MIL/uL — ABNORMAL LOW (ref 3.87–5.11)
RDW: 17.3 % — AB (ref 11.5–15.5)
WBC: 2.5 10*3/uL — ABNORMAL LOW (ref 4.0–10.5)

## 2017-10-14 LAB — BODY FLUID CELL COUNT WITH DIFFERENTIAL
Lymphs, Fluid: 14 %
Monocyte-Macrophage-Serous Fluid: 79 % (ref 50–90)
NEUTROPHIL FLUID: 7 % (ref 0–25)
WBC FLUID: 680 uL (ref 0–1000)

## 2017-10-14 LAB — PREPARE RBC (CROSSMATCH)

## 2017-10-14 LAB — TROPONIN I
TROPONIN I: 0.19 ng/mL — AB (ref <0.03)
TROPONIN I: 0.19 ng/mL — AB (ref <0.03)

## 2017-10-14 LAB — IRON AND TIBC
IRON: 16 ug/dL — AB (ref 28–170)
SATURATION RATIOS: 6 % — AB (ref 10.4–31.8)
TIBC: 252 ug/dL (ref 250–450)
UIBC: 236 ug/dL

## 2017-10-14 LAB — APTT: aPTT: 26 seconds (ref 24–36)

## 2017-10-14 LAB — PROTEIN, PLEURAL OR PERITONEAL FLUID

## 2017-10-14 LAB — FERRITIN: FERRITIN: 160 ng/mL (ref 11–307)

## 2017-10-14 LAB — ECHOCARDIOGRAM COMPLETE

## 2017-10-14 LAB — VITAMIN B12: Vitamin B-12: 1486 pg/mL — ABNORMAL HIGH (ref 180–914)

## 2017-10-14 MED ORDER — DONEPEZIL HCL 10 MG PO TABS
10.0000 mg | ORAL_TABLET | Freq: Every day | ORAL | Status: DC
  Administered 2017-10-14 – 2017-11-06 (×23): 10 mg via ORAL
  Filled 2017-10-14 (×23): qty 1

## 2017-10-14 MED ORDER — DOCUSATE SODIUM 100 MG PO CAPS
100.0000 mg | ORAL_CAPSULE | Freq: Two times a day (BID) | ORAL | Status: DC
  Administered 2017-10-14 – 2017-11-07 (×44): 100 mg via ORAL
  Filled 2017-10-14 (×47): qty 1

## 2017-10-14 MED ORDER — IMATINIB MESYLATE 400 MG PO TABS: 400.0000 mg | ORAL_TABLET | Freq: Every day | ORAL | Status: DC

## 2017-10-14 MED ORDER — LEVOFLOXACIN IN D5W 750 MG/150ML IV SOLN
750.0000 mg | INTRAVENOUS | Status: DC
  Administered 2017-10-15 – 2017-10-17 (×3): 750 mg via INTRAVENOUS
  Filled 2017-10-14 (×4): qty 150

## 2017-10-14 MED ORDER — FUROSEMIDE 40 MG PO TABS
40.0000 mg | ORAL_TABLET | Freq: Every day | ORAL | Status: DC
  Administered 2017-10-15 – 2017-10-16 (×2): 40 mg via ORAL
  Filled 2017-10-14 (×2): qty 1

## 2017-10-14 MED ORDER — ACETAMINOPHEN 650 MG RE SUPP: 650.0000 mg | Freq: Four times a day (QID) | RECTAL | Status: DC | PRN

## 2017-10-14 MED ORDER — METHOCARBAMOL 500 MG PO TABS
500.0000 mg | ORAL_TABLET | Freq: Three times a day (TID) | ORAL | Status: DC | PRN
Start: 2017-10-14 — End: 2017-11-07
  Administered 2017-10-18 – 2017-10-21 (×2): 500 mg via ORAL
  Filled 2017-10-14 (×4): qty 1

## 2017-10-14 MED ORDER — VITAMIN B-12 1000 MCG PO TABS
1000.0000 ug | ORAL_TABLET | Freq: Two times a day (BID) | ORAL | Status: DC
  Administered 2017-10-14 – 2017-11-07 (×48): 1000 ug via ORAL
  Filled 2017-10-14 (×49): qty 1

## 2017-10-14 MED ORDER — ACETAMINOPHEN 325 MG PO TABS
650.0000 mg | ORAL_TABLET | Freq: Four times a day (QID) | ORAL | Status: DC | PRN
  Administered 2017-10-16 – 2017-10-26 (×4): 650 mg via ORAL
  Filled 2017-10-14 (×5): qty 2

## 2017-10-14 MED ORDER — MIRABEGRON ER 25 MG PO TB24
50.0000 mg | ORAL_TABLET | Freq: Every day | ORAL | Status: DC
  Administered 2017-10-14 – 2017-11-06 (×23): 50 mg via ORAL
  Filled 2017-10-14 (×24): qty 2

## 2017-10-14 MED ORDER — LIDOCAINE HCL 2 % IJ SOLN
INTRAMUSCULAR | Status: AC
  Filled 2017-10-14: qty 10

## 2017-10-14 MED ORDER — SENNOSIDES-DOCUSATE SODIUM 8.6-50 MG PO TABS: 1.0000 | ORAL_TABLET | Freq: Every evening | ORAL | Status: DC | PRN

## 2017-10-14 MED ORDER — LOSARTAN POTASSIUM 50 MG PO TABS
25.0000 mg | ORAL_TABLET | Freq: Every day | ORAL | Status: DC
  Administered 2017-10-14 – 2017-10-19 (×6): 25 mg via ORAL
  Filled 2017-10-14 (×6): qty 1

## 2017-10-14 MED ORDER — FLUTICASONE PROPIONATE 50 MCG/ACT NA SUSP
1.0000 | Freq: Every day | NASAL | Status: DC
  Administered 2017-10-15 – 2017-11-06 (×23): 1 via NASAL
  Filled 2017-10-14: qty 16

## 2017-10-14 NOTE — Progress Notes (Signed)
  Echocardiogram 2D Echocardiogram has been performed.  Darlina Sicilian M 10/14/2017, 8:48 AM

## 2017-10-14 NOTE — Procedures (Signed)
Ultrasound-guided diagnostic and therapeutic right thoracentesis performed yielding 1.8 liters of dark bloody  fluid. No immediate complications. Follow-up chest x-ray pending. A portion of the fluid was sent to the lab for preordered studies.

## 2017-10-14 NOTE — Progress Notes (Signed)
PHARMACY NOTE:  ANTIMICROBIAL RENAL DOSAGE ADJUSTMENT  Current antimicrobial regimen includes a mismatch between antimicrobial dosage and estimated renal function.  As per policy approved by the Pharmacy & Therapeutics and Medical Executive Committees, the antimicrobial dosage will be adjusted accordingly.  Current antimicrobial dosage:  Levofloxacin 750mg  IV q48h   Indication: CAP  Renal Function:  Estimated Creatinine Clearance: 55.9 mL/min (by C-G formula based on SCr of 0.77 mg/dL). []      On intermittent HD, scheduled: []      On CRRT    Antimicrobial dosage has been changed to:  Levofloxacin 750mg  IV q24h  Additional comments:   Thank you for allowing pharmacy to be a part of this patient's care.  Clovis Riley, [facility] 10/14/2017 10:00 PM

## 2017-10-14 NOTE — Progress Notes (Signed)
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  She had been making gurgling noises in her chest and wheezing for several days prior to admission.  Chest x-ray demonstrated a large right-sided pleural effusion.  She is anemic with a hemoglobin of 7.2.  Occult stool was negative.  Her oxygen saturation prior to coming to the emergency department was 70% on room air and she was started on nasal cannula.  She was transfused 2 units of blood and underwent a thoracentesis on 12/12 with removal of 1.8 L of dark fluid.  Assessment & Plan:  Acute hypoxic respiratory distress, 70% on room air at home.   Large right-sided pleural effusion, unknown etiology Possible underlying pneumonia and acute on chronic diastolic heart failure, although heart failure does not cause dark/bloody pleural effusions -Continue Rocephin and ceftriaxone  - blood cultures and sputum culture ordered -Thoracentesis labs are pending.  The dark color is concerning for malignant pleural effusion  - Echocardiogram: Grade 1 diastolic dysfunction with elevated left and right heart filling pressures -  Start lasix 40 mg once daily -  Continue levofloxacin  Anemia due to malignancy, occult stool negative.  Although iron levels and saturation are low, TIBC is low and ferritin is normal, suggestive of chronic disease.  b12 elevatd -  Transfused 2 unit PRBC -  F/u CBC  Demand ischemia due to anemia and pleural effusion -Troponin first set is 0.67, EKG is  negative for any acute ST-T changes.  - cycle troponins - no regional wall motion abnormalities  Hyponatremia - This appears to be chronic.  No further workup at this time.  History of atrial fibrillation - Previously determined not on anticoagulation due to her history of thrombocytopenia.  GIST with Liver Mets -Follows at [facility], per family this is stable.   -  Gleevec held due to anemia  History of acoustic neuroma -Resected in 1995  History of breast cancer status post mastectomy -Currently appears to be in remission  Essential hypertension - Currently borderline low blood pressure.  Will closely monitor.  Will treat with IV medications as needed  Alzheimer's dementia -Continue home medications.  Tele:  NSR.    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 Disposition Plan:  Home in a few days   Consultants:   [facility]  Procedures:  Thoracentesis on 12/12  Antimicrobials:  Anti-infectives (From admission, onward)   Start     Dose/Rate Route Frequency Ordered Stop   10/14/17 2000  levofloxacin (LEVAQUIN) IVPB 750 mg     750 mg 100 mL/hr over 90 Minutes Intravenous Every 48 hours 10/13/17 2132     10/13/17 1930  cefTRIAXone (ROCEPHIN) 1 g in dextrose 5 % 50 mL IVPB     1 g 100 mL/hr over 30 Minutes Intravenous  Once 10/13/17 1915 10/13/17 2058   10/13/17 1930  azithromycin (ZITHROMAX) tablet 500 mg     500 mg Oral  Once 10/13/17 1915 10/13/17 2016       Subjective:  Confused.  Denies difficulty breathing, cough, discomfort however the patient's daughter-in-law who is at bedside states that she has been having labored breathing that appears better today and that she has been coughing frequently.  Her cough is productive of clear phlegm.  She has not been complaining about pain or  nausea  Objective: Vitals:   10/14/17 1300 10/14/17 1417 10/14/17 1454 10/14/17 1500  BP: 127/61 (!) 141/71 (!) 132/57 (!) 156/64  Pulse: (!) 58   64  Resp: 20   20  Temp: 98 F (36.7 C)   98.9 F (37.2 C)  TempSrc: Oral   Oral  SpO2: 99%   97%  Weight:  96.6 kg (213 lb)    Height:  5\' 4"  (1.626 m)      Intake/Output Summary (Last 24 hours) at 10/14/2017 1604 Last data filed at 10/14/2017 1300 Gross per 24 hour  Intake 1184 ml  Output 0 ml  Net 1184 ml   Filed Weights   10/14/17 1417  Weight: 96.6 kg (213 lb)    Examination:  General exam:  Adult female.  No acute distress.  HEENT:  NCAT, MMM Respiratory system: Diminished to the apex on the right side, somewhat rhonchorous left-sided breath sounds, no focal rales or wheeze Cardiovascular system: Regular rate and rhythm, normal S1/S2. No murmurs, rubs, gallops or clicks.  Warm extremities Gastrointestinal system: Normal active bowel sounds, soft, nondistended, nontender.  Reducible ventral hernia.   MSK:  Normal tone and bulk, trace bilateral lower extremity edema Neuro:  Possible right facial droop and a little weaker in the right extremities compared to the left.  Unable to lift legs from bed.       Data Reviewed: I have personally reviewed following labs and imaging studies  CBC: Recent Labs  Lab 10/13/17 1750  WBC 3.3*  NEUTROABS 3.0  HGB 7.2*  HCT 21.2*  MCV 98.1  PLT 160*   Basic Metabolic Panel: Recent Labs  Lab 10/13/17 1750  NA 130*  K 3.9  CL 96*  CO2 24  GLUCOSE 126*  BUN 15  CREATININE 0.74  CALCIUM 8.4*   GFR: Estimated Creatinine Clearance: 55.9 mL/min (by C-G formula based on SCr of 0.74 mg/dL). Liver Function Tests: Recent Labs  Lab 10/13/17 1750  AST 48*  ALT 16  ALKPHOS 66  BILITOT 0.7  PROT 5.6*  ALBUMIN 3.3*   No results for input(s): LIPASE, AMYLASE in the last 168 hours. No results for input(s): AMMONIA in the last 168 hours. Coagulation Profile: No results for  input(s): INR, PROTIME in the last 168 hours. Cardiac Enzymes: Recent Labs  Lab 10/13/17 1750  TROPONINI 0.67*   BNP (last 3 results) No results for input(s): PROBNP in the last 8760 hours. HbA1C: No results for input(s): HGBA1C in the last 72 hours. CBG: No results for input(s): GLUCAP in the last 168 hours. Lipid Profile: No results for input(s): CHOL, HDL, LDLCALC, TRIG, CHOLHDL, LDLDIRECT in the last 72 hours. Thyroid Function Tests: Recent Labs    10/13/17 2127  TSH 0.919   Anemia Panel: Recent Labs    10/13/17 2127  VITAMINB12 1,486*  FERRITIN 160  TIBC 252  IRON 16*   Urine analysis:    Component Value Date/Time   COLORURINE YELLOW 10/13/2017 1918   APPEARANCEUR CLEAR 10/13/2017 1918   LABSPEC 1.013 10/13/2017 1918   PHURINE 5.0 10/13/2017 1918   GLUCOSEU NEGATIVE 10/13/2017 1918  HGBUR SMALL (A) 10/13/2017 1918   BILIRUBINUR NEGATIVE 10/13/2017 1918   BILIRUBINUR negative 04/05/2016 1414   KETONESUR 5 (A) 10/13/2017 1918   PROTEINUR NEGATIVE 10/13/2017 1918   UROBILINOGEN 0.2 04/05/2016 1414   UROBILINOGEN 1.0 07/07/2015 1901   NITRITE NEGATIVE 10/13/2017 1918   LEUKOCYTESUR NEGATIVE 10/13/2017 1918   Sepsis Labs: @LABRCNTIP (procalcitonin:4,lacticidven:4)  )No results found for this or any previous visit (from the past 240 hour(s)).    Radiology Studies: Dg Chest 1 View  Result Date: 10/14/2017 CLINICAL DATA:  Right-sided thoracentesis for pleural effusion. History of breast cancer and gastrointestinal stromal tumor. EXAM: CHEST 1 VIEW COMPARISON:  10/13/2017 FINDINGS: Heart is top normal with thoracic aortic atherosclerosis. Near complete clearing of right-sided pleural effusion without pneumothorax status post right-sided thoracentesis. Probable tiny left pleural effusion blunting the left lateral costophrenic angle. Bibasilar and left mid lung atelectasis. Osteoarthritis of the AC glenohumeral joints. No suspicious osseous lesions. Surgical clips  are seen in the expected location of right thyroid gland. IMPRESSION: 1. Near complete resolution of right sided pleural effusion. 2. No pneumothorax is identified. 3. Bibasilar and left mid lung atelectasis. Probable tiny left pleural effusion. 4. Stable cardiomegaly with aortic atherosclerosis. Electronically Signed   By: Ashley Royalty M.D.   On: 10/14/2017 15:50   Dg Chest 2 View  Result Date: 10/13/2017 CLINICAL DATA:  83 year old female with wheezing and productive cough. Initial encounter. EXAM: CHEST  2 VIEW COMPARISON:  07/28/2016. FINDINGS: Large right-sided pleural effusion. Suspect associated right middle lobe and right lower lobe atelectasis. Cannot exclude lower lobe infiltrate or mass. Cardiomegaly. Pulmonary vascular prominence greater centrally. Calcified tortuous aorta. Bilateral shoulder joint degenerative changes. Surgical clips right thyroid bed region. IMPRESSION: Large right-sided pleural effusion. Suspect associated right middle lobe and right lower lobe atelectasis. Cannot exclude lower lobe infiltrate or mass. Cardiomegaly. Aortic Atherosclerosis (ICD10-I70.0). Electronically Signed   By: Genia Del M.D.   On: 10/13/2017 18:23     Scheduled Meds: . docusate sodium  100 mg Oral BID  . donepezil  10 mg Oral QHS  . fluticasone  1 spray Each Nare Daily  . ipratropium-albuterol  3 mL Nebulization Q6H  . lidocaine      . losartan  25 mg Oral Daily  . mirabegron ER  50 mg Oral QHS  . vitamin B-12  1,000 mcg Oral BID   Continuous Infusions: . levofloxacin (LEVAQUIN) IV       LOS: 1 day    Time spent: 30 min    Janece Canterbury, MD [facility] Pager [phone]  If 7PM-7AM, please contact night-coverage [email or web address] Password [facility] 10/14/2017, 4:04 PM

## 2017-10-14 NOTE — Progress Notes (Signed)
Pharmacy: hold criteria for Gleevec  Imatinib (Gleevec) hold criteria  Hgb < 8  ANC < 1  Pltc < 100K  AST or ALT >5x ULN  Bilirubin > 3x ULN  Gastrointestinal perforation  Hemorrhage  New or worsened CHF  Severe fluid overload  Meets hold criteria with Hgb of 7.2  Gleevec not continued on admission, MD resume when appropriate  Thank you,   Minda Ditto PharmD Pager [phone] 10/14/2017, 3:46 AM

## 2017-10-14 NOTE — Care Management Note (Signed)
Case Management Note  Patient Details  Name: Melanie Trevino MRN: [id number] Date of Birth: [birth year]  Subjective/Objective:                  resp distress  Action/Plan: Date: October 14, 2017 Melanie Trevino, BSN, [place], [place] Chart and notes review for patient progress and needs. Will follow for case management and discharge needs./ uses [facility] agency at home. Next review date: [id number]  Expected Discharge Date:  (unknown)               Expected Discharge Plan:  Home/Self Care  In-House Referral:     Discharge planning Services  CM Consult  Post Acute Care Choice:    Choice offered to:     DME Arranged:    DME Agency:     HH Arranged:    HH Agency:     Status of Service:  In process, will continue to follow  If discussed at Long Length of Stay Meetings, dates discussed:    Additional Comments:  Leeroy Cha, RN 10/14/2017, 8:36 AM

## 2017-10-14 NOTE — Progress Notes (Signed)
CRITICAL VALUE ALERT  Critical Value:  Tropoinin 0.19  Date & Time Notied:  10/14/17 @1700   Provider Notified:  Short   Orders Received/Actions taken: No new orders at this time. MD aware

## 2017-10-15 DIAGNOSIS — J90 Pleural effusion, not elsewhere classified: Secondary | ICD-10-CM

## 2017-10-15 LAB — BASIC METABOLIC PANEL
Anion gap: 6 (ref 5–15)
BUN: 19 mg/dL (ref 6–20)
CHLORIDE: 100 mmol/L — AB (ref 101–111)
CO2: 27 mmol/L (ref 22–32)
CREATININE: 0.89 mg/dL (ref 0.44–1.00)
Calcium: 8 mg/dL — ABNORMAL LOW (ref 8.9–10.3)
GFR calc Af Amer: >60 mL/min (ref >60)
GFR calc non Af Amer: 57 mL/min — ABNORMAL LOW (ref >60)
GLUCOSE: 98 mg/dL (ref 65–99)
POTASSIUM: 3.8 mmol/L (ref 3.5–5.1)
SODIUM: 133 mmol/L — AB (ref 135–145)

## 2017-10-15 LAB — TYPE AND SCREEN
ABO/RH(D): A NEG
ANTIBODY SCREEN: POSITIVE
DONOR AG TYPE: NEGATIVE
DONOR AG TYPE: NEGATIVE
PT AG TYPE: NEGATIVE
Unit division: 0
Unit division: 0

## 2017-10-15 LAB — FOLATE RBC
FOLATE, HEMOLYSATE: 382.2 ng/mL
Folate, RBC: 1309 ng/mL (ref >498)
HEMATOCRIT: 29.2 % — AB (ref 34.0–46.6)

## 2017-10-15 LAB — AMYLASE, PLEURAL OR PERITONEAL FLUID: AMYLASE FL: 61 U/L

## 2017-10-15 LAB — CBC
HEMATOCRIT: 25.6 % — AB (ref 36.0–46.0)
Hemoglobin: 8.7 g/dL — ABNORMAL LOW (ref 12.0–15.0)
MCH: 31.6 pg (ref 26.0–34.0)
MCHC: 34 g/dL (ref 30.0–36.0)
MCV: 93.1 fL (ref 78.0–100.0)
PLATELETS: 141 10*3/uL — AB (ref 150–400)
RBC: 2.75 MIL/uL — ABNORMAL LOW (ref 3.87–5.11)
RDW: 17.3 % — AB (ref 11.5–15.5)
WBC: 6.7 10*3/uL (ref 4.0–10.5)

## 2017-10-15 LAB — TROPONIN I: Troponin I: 0.15 ng/mL (ref <0.03)

## 2017-10-15 LAB — BPAM RBC
BLOOD PRODUCT EXPIRATION DATE: 201812282359
BLOOD PRODUCT EXPIRATION DATE: 201901082359
ISSUE DATE / TIME: 201812120445
ISSUE DATE / TIME: 201812120859
UNIT TYPE AND RH: 600
Unit Type and Rh: 600

## 2017-10-15 LAB — TRIGLYCERIDES, BODY FLUIDS: Triglycerides, Fluid: 24 mg/dL

## 2017-10-15 LAB — FOLATE: Folate: 6.7 ng/mL (ref >5.9)

## 2017-10-15 MED ORDER — IPRATROPIUM-ALBUTEROL 0.5-2.5 (3) MG/3ML IN SOLN
3.0000 mL | Freq: Three times a day (TID) | RESPIRATORY_TRACT | Status: DC
  Administered 2017-10-15 – 2017-10-18 (×8): 3 mL via RESPIRATORY_TRACT
  Filled 2017-10-15 (×8): qty 3

## 2017-10-15 MED ORDER — ZOLPIDEM TARTRATE 5 MG PO TABS
5.0000 mg | ORAL_TABLET | Freq: Once | ORAL | Status: AC
  Administered 2017-10-15: 5 mg via ORAL
  Filled 2017-10-15: qty 1

## 2017-10-15 NOTE — Progress Notes (Signed)
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  She had been making gurgling noises in her chest and wheezing for several days prior to admission.  Chest x-ray demonstrated a large right-sided pleural effusion.  She is anemic with a hemoglobin of 7.2.  Occult stool was negative.  Her oxygen saturation prior to coming to the emergency department was 70% on room air and she was started on nasal cannula.  She was transfused 2 units of blood and underwent a thoracentesis on 12/12 with removal of 1.8 L of dark fluid.  Assessment & Plan:  Acute hypoxic respiratory distress due to large right-sided exudative effusion by LDH criteria.  Dark fluid concerning for malignancy.  Alternatively, could be secondary to pneumonia.   -Continue levofloxacin  - blood cultures NGTD - Sputum culture ordered -  Follow up pleural fluid culture -Thoracentesis labs consistent with exudative effusion.  The dark color is concerning for malignant pleural effusion  - Echocardiogram: Grade 1 diastolic dysfunction with elevated left and right heart filling pressures -Continue Lasix 40 mg once daily -  F/u with pulmonology in 1 week to review cytology and discuss further treatment options of pleural effusion  Anemia due to malignancy, occult stool negative.  Although iron levels and saturation are low, TIBC is low and ferritin is normal, suggestive of chronic disease.  b12 elevated, folate normal.  Hemoglobin initially increased appropriately with blood  transfusion however it is trending down again today. -  Transfused 2 unit PRBC on 12/11 -  F/u CBC  Demand ischemia due to anemia and pleural effusion -Troponin first set is 0.67, subsequent troponin 0 0.19, 0.19, 0.15  - EKG is negative for any acute ST-T changes.  - no regional wall motion abnormalities on ECHO  Hyponatremia - This appears to be chronic.  No further workup at this time.  History of atrial fibrillation - Previously determined not on anticoagulation due to her history of thrombocytopenia.  GIST with Liver Mets -  Follows at [facility], per family this is stable.   -  Gleevec held due to anemia  History of acoustic neuroma -Resected in 1995  History of breast cancer status post mastectomy -Currently appears to be in remission  Essential hypertension - Currently borderline low blood pressure.  Will closely monitor.  Will treat with IV medications as needed  Alzheimer's dementia -Continue home medications.  Tele:  NSR with 1st degree heart block  DVT prophylaxis:  Continue SCDs, hgb trending down again Code Status:  Full  Family Communication:  Patient and aid who is at bedside Disposition Plan:  Home pending results of pleural fluid culture and stable hemoglobin.  Patient has 24-hour aides to assist her.   Consultants:   [facility]  Procedures:  Thoracentesis on 12/12  Antimicrobials:  Anti-infectives (From admission, onward)   Start     Dose/Rate Route Frequency Ordered Stop   10/15/17 2000  levofloxacin (LEVAQUIN) IVPB 750 mg     750 mg 100 mL/hr over 90 Minutes Intravenous Every 24 hours 10/14/17 2200     10/14/17 2000  levofloxacin (LEVAQUIN) IVPB  750 mg  Status:  Discontinued     750 mg 100 mL/hr over 90 Minutes Intravenous Every 48 hours 10/13/17 2132 10/14/17 2200   10/13/17 1930  cefTRIAXone (ROCEPHIN) 1 g in dextrose 5 % 50 mL IVPB     1 g 100 mL/hr over 30 Minutes Intravenous  Once 10/13/17 1915 10/13/17 2058   10/13/17 1930   azithromycin (ZITHROMAX) tablet 500 mg     500 mg Oral  Once 10/13/17 1915 10/13/17 2016       Subjective:  Confused, states that she feels fine.  She denies shortness of breath, nausea, abdominal pains.  Denies chest pains.  Sitter states that she has been coughing a lot but that her appearance of shortness of breath has improved since yesterday.  She has not been complaining of any chest pains.  Objective: Vitals:   10/15/17 0425 10/15/17 0800 10/15/17 0845 10/15/17 1326  BP: 120/60  120/67 (!) 115/59  Pulse: (!) 58  60 (!) 59  Resp: 20  20 20   Temp: 98.3 F (36.8 C)  98.5 F (36.9 C) 98.9 F (37.2 C)  TempSrc: Oral  Oral Oral  SpO2: 97% 97% 98% 99%  Weight:      Height:        Intake/Output Summary (Last 24 hours) at 10/15/2017 1449 Last data filed at 10/15/2017 1245 Gross per 24 hour  Intake 120 ml  Output 1200 ml  Net -1080 ml   Filed Weights   10/14/17 1417  Weight: 96.6 kg (213 lb)    Examination:  General exam:  Adult female, pleasantly confused.  No acute distress.  HEENT:  NCAT, MMM Respiratory system: Diminished at the right base, improved aeration on the right side with rales heard throughout all the right lung fields.  No rhonchi or wheeze Cardiovascular system: Regular rate and rhythm, normal S1/S2. No murmurs, rubs, gallops or clicks.  Warm extremities Gastrointestinal system: Normal active bowel sounds, soft, nondistended, nontender. MSK:  Normal tone and bulk, no lower extremity edema Neuro: Right hemiparesis with right facial droop.     Data Reviewed: I have personally reviewed following labs and imaging studies  CBC: Recent Labs  Lab 10/13/17 1750 10/14/17 1552 10/15/17 0418  WBC 3.3* 2.5* 6.7  NEUTROABS 3.0  --   --   HGB 7.2* 10.0* 8.7*  HCT 21.2* 29.2*  29.2* 25.6*  MCV 98.1 93.6 93.1  PLT 140* 147* 096*   Basic Metabolic Panel: Recent Labs  Lab 10/13/17 1750 10/14/17 1552 10/15/17 0418  NA 130* 132* 133*  K 3.9 4.1 3.8    CL 96* 98* 100*  CO2 24 27 27   GLUCOSE 126* 111* 98  BUN 15 14 19   CREATININE 0.74 0.77 0.89  CALCIUM 8.4* 8.2* 8.0*   GFR: Estimated Creatinine Clearance: 50.3 mL/min (by C-G formula based on SCr of 0.89 mg/dL). Liver Function Tests: Recent Labs  Lab 10/13/17 1750 10/14/17 1552  AST 48* 43*  ALT 16 15  ALKPHOS 66 62  BILITOT 0.7 0.9  PROT 5.6* 5.3*  ALBUMIN 3.3* 3.0*   No results for input(s): LIPASE, AMYLASE in the last 168 hours. No results for input(s): AMMONIA in the last 168 hours. Coagulation Profile: Recent Labs  Lab 10/14/17 1552  INR 1.25   Cardiac Enzymes: Recent Labs  Lab 10/13/17 1750 10/14/17 1552 10/14/17 2227 10/15/17 0418  TROPONINI 0.67* 0.19* 0.19* 0.15*   BNP (last 3 results) No results for input(s): PROBNP in the last 8760 hours. HbA1C: No results  for input(s): HGBA1C in the last 72 hours. CBG: No results for input(s): GLUCAP in the last 168 hours. Lipid Profile: No results for input(s): CHOL, HDL, LDLCALC, TRIG, CHOLHDL, LDLDIRECT in the last 72 hours. Thyroid Function Tests: Recent Labs    10/13/17 2127  TSH 0.919   Anemia Panel: Recent Labs    10/13/17 2127 10/15/17 0418  VITAMINB12 1,486*  --   FOLATE  --  6.7  FERRITIN 160  --   TIBC 252  --   IRON 16*  --    Urine analysis:    Component Value Date/Time   COLORURINE YELLOW 10/13/2017 1918   APPEARANCEUR CLEAR 10/13/2017 1918   LABSPEC 1.013 10/13/2017 1918   PHURINE 5.0 10/13/2017 1918   GLUCOSEU NEGATIVE 10/13/2017 1918   HGBUR SMALL (A) 10/13/2017 1918   BILIRUBINUR NEGATIVE 10/13/2017 1918   BILIRUBINUR negative 04/05/2016 1414   KETONESUR 5 (A) 10/13/2017 1918   PROTEINUR NEGATIVE 10/13/2017 1918   UROBILINOGEN 0.2 04/05/2016 1414   UROBILINOGEN 1.0 07/07/2015 1901   NITRITE NEGATIVE 10/13/2017 1918   LEUKOCYTESUR NEGATIVE 10/13/2017 1918   Sepsis Labs: @LABRCNTIP (procalcitonin:4,lacticidven:4)  ) Recent Results (from the past 240 hour(s))  Culture,  blood (routine x 2)     Status: None (Preliminary result)   Collection Time: 10/13/17  8:03 PM  Result Value Ref Range Status   Specimen Description BLOOD LEFT ANTECUBITAL  Final   Special Requests   Final    BOTTLES DRAWN AEROBIC AND ANAEROBIC Blood Culture adequate volume   Culture   Final    NO GROWTH 1 DAY Performed at [facility], [place] [address], [place], [place] [address]    Report Status PENDING  Incomplete  Culture, blood (routine x 2)     Status: None (Preliminary result)   Collection Time: 10/13/17  9:27 PM  Result Value Ref Range Status   Specimen Description BLOOD LEFT ARM  Final   Special Requests   Final    BOTTLES DRAWN AEROBIC AND ANAEROBIC Blood Culture adequate volume   Culture   Final    NO GROWTH 1 DAY Performed at [facility], [place] [address] [place], [place] [address]    Report Status PENDING  Incomplete  Body fluid culture     Status: None (Preliminary result)   Collection Time: 10/14/17  2:47 PM  Result Value Ref Range Status   Specimen Description PLEURAL  Final   Special Requests RIGHT  Final   Gram Stain   Final    FEW WBC PRESENT, PREDOMINANTLY MONONUCLEAR NO ORGANISMS SEEN    Culture   Final    NO GROWTH < 12 HOURS Performed at [facility], [address], [place], [place] [address]    Report Status PENDING  Incomplete      Radiology Studies: Dg Chest 1 View  Result Date: 10/14/2017 CLINICAL DATA:  Right-sided thoracentesis for pleural effusion. History of breast cancer and gastrointestinal stromal tumor. EXAM: CHEST 1 VIEW COMPARISON:  10/13/2017 FINDINGS: Heart is top normal with thoracic aortic atherosclerosis. Near complete clearing of right-sided pleural effusion without pneumothorax status post right-sided thoracentesis. Probable tiny left pleural effusion blunting the left lateral costophrenic angle. Bibasilar and left mid lung atelectasis. Osteoarthritis of the AC glenohumeral joints. No suspicious osseous lesions.  Surgical clips are seen in the expected location of right thyroid gland. IMPRESSION: 1. Near complete resolution of right sided pleural effusion. 2. No pneumothorax is identified. 3. Bibasilar and left mid lung atelectasis. Probable  tiny left pleural effusion. 4. Stable cardiomegaly with aortic atherosclerosis. Electronically Signed   By: Ashley Royalty M.D.   On: 10/14/2017 15:50   Dg Chest 2 View  Result Date: 10/13/2017 CLINICAL DATA:  83 year old female with wheezing and productive cough. Initial encounter. EXAM: CHEST  2 VIEW COMPARISON:  07/28/2016. FINDINGS: Large right-sided pleural effusion. Suspect associated right middle lobe and right lower lobe atelectasis. Cannot exclude lower lobe infiltrate or mass. Cardiomegaly. Pulmonary vascular prominence greater centrally. Calcified tortuous aorta. Bilateral shoulder joint degenerative changes. Surgical clips right thyroid bed region. IMPRESSION: Large right-sided pleural effusion. Suspect associated right middle lobe and right lower lobe atelectasis. Cannot exclude lower lobe infiltrate or mass. Cardiomegaly. Aortic Atherosclerosis (ICD10-I70.0). Electronically Signed   By: Genia Del M.D.   On: 10/13/2017 18:23   US Thoracentesis Asp Pleural Space W/img Guide  Result Date: 10/14/2017 INDICATION: Patient with history of breast cancer, gastrointestinal stromal tumor, dyspnea, right pleural effusion. Request made for diagnostic and therapeutic right thoracentesis. EXAM: ULTRASOUND GUIDED DIAGNOSTIC AND THERAPEUTIC RIGHT THORACENTESIS MEDICATIONS: None. COMPLICATIONS: None immediate. PROCEDURE: An ultrasound guided thoracentesis was thoroughly discussed with the patient and questions answered. The benefits, risks, alternatives and complications were also discussed. The patient understands and wishes to proceed with the procedure. Written consent was obtained. Ultrasound was performed to localize and mark an adequate pocket of fluid in the right chest.  The area was then prepped and draped in the normal sterile fashion. 1% Lidocaine was used for local anesthesia. Under ultrasound guidance a Safe-T-Centesis catheter was introduced. Thoracentesis was performed. The catheter was removed and a dressing applied. FINDINGS: A total of approximately 1.8 liters of dark, bloody fluid was removed. Samples were sent to the laboratory as requested by the clinical team. IMPRESSION: Successful ultrasound guided diagnostic and therapeutic right thoracentesis yielding 1.8 liters of pleural fluid. Read by: Rowe Robert, PA-C Electronically Signed   By: Jerilynn Mages.  Shick M.D.   On: 10/14/2017 15:15     Scheduled Meds: . docusate sodium  100 mg Oral BID  . donepezil  10 mg Oral QHS  . fluticasone  1 spray Each Nare Daily  . furosemide  40 mg Oral Daily  . ipratropium-albuterol  3 mL Nebulization Q6H  . losartan  25 mg Oral Daily  . mirabegron ER  50 mg Oral QHS  . vitamin B-12  1,000 mcg Oral BID   Continuous Infusions: . levofloxacin (LEVAQUIN) IV       LOS: 2 days    Time spent: 30 min    Janece Canterbury, MD [facility] Pager [phone]  If 7PM-7AM, please contact night-coverage [email or web address] Password [email or web address] 10/15/2017, 2:49 PM

## 2017-10-16 ENCOUNTER — Other Ambulatory Visit: Payer: Self-pay

## 2017-10-16 ENCOUNTER — Inpatient Hospital Stay (HOSPITAL_COMMUNITY): Payer: Medicare Other

## 2017-10-16 DIAGNOSIS — R062 Wheezing: Secondary | ICD-10-CM

## 2017-10-16 LAB — BASIC METABOLIC PANEL
Anion gap: 5 (ref 5–15)
BUN: 19 mg/dL (ref 6–20)
CALCIUM: 8 mg/dL — AB (ref 8.9–10.3)
CO2: 29 mmol/L (ref 22–32)
Chloride: 104 mmol/L (ref 101–111)
Creatinine, Ser: 0.82 mg/dL (ref 0.44–1.00)
GFR calc Af Amer: >60 mL/min (ref >60)
GLUCOSE: 98 mg/dL (ref 65–99)
Potassium: 3.3 mmol/L — ABNORMAL LOW (ref 3.5–5.1)
Sodium: 138 mmol/L (ref 135–145)

## 2017-10-16 LAB — CBC
HCT: 27.3 % — ABNORMAL LOW (ref 36.0–46.0)
Hemoglobin: 9.2 g/dL — ABNORMAL LOW (ref 12.0–15.0)
MCH: 32.1 pg (ref 26.0–34.0)
MCHC: 33.7 g/dL (ref 30.0–36.0)
MCV: 95.1 fL (ref 78.0–100.0)
PLATELETS: 140 10*3/uL — AB (ref 150–400)
RBC: 2.87 MIL/uL — ABNORMAL LOW (ref 3.87–5.11)
RDW: 16.8 % — AB (ref 11.5–15.5)
WBC: 4.6 10*3/uL (ref 4.0–10.5)

## 2017-10-16 MED ORDER — FUROSEMIDE 40 MG PO TABS: 40.0000 mg | ORAL_TABLET | Freq: Every day | ORAL | 0 refills | Status: DC

## 2017-10-16 MED ORDER — IPRATROPIUM-ALBUTEROL 0.5-2.5 (3) MG/3ML IN SOLN: 3.0000 mL | RESPIRATORY_TRACT | 0 refills | Status: AC | PRN

## 2017-10-16 MED ORDER — ALUM & MAG HYDROXIDE-SIMETH 200-200-20 MG/5ML PO SUSP
30.0000 mL | Freq: Four times a day (QID) | ORAL | Status: DC | PRN
  Administered 2017-10-22: 30 mL via ORAL
  Filled 2017-10-16 (×2): qty 30

## 2017-10-16 MED ORDER — POTASSIUM CHLORIDE CRYS ER 20 MEQ PO TBCR
40.0000 meq | EXTENDED_RELEASE_TABLET | Freq: Once | ORAL | Status: AC
  Administered 2017-10-16: 40 meq via ORAL
  Filled 2017-10-16: qty 2

## 2017-10-16 MED ORDER — LEVOFLOXACIN 750 MG PO TABS: 750.0000 mg | ORAL_TABLET | Freq: Every day | ORAL | 0 refills | Status: DC

## 2017-10-16 MED ORDER — IPRATROPIUM-ALBUTEROL 0.5-2.5 (3) MG/3ML IN SOLN
3.0000 mL | RESPIRATORY_TRACT | Status: DC | PRN
  Administered 2017-10-20: 3 mL via RESPIRATORY_TRACT
  Filled 2017-10-16: qty 3

## 2017-10-16 MED ORDER — FUROSEMIDE 10 MG/ML IJ SOLN
40.0000 mg | Freq: Two times a day (BID) | INTRAMUSCULAR | Status: DC
  Administered 2017-10-16 – 2017-10-18 (×4): 40 mg via INTRAVENOUS
  Filled 2017-10-16 (×4): qty 4

## 2017-10-16 MED ORDER — ZOLPIDEM TARTRATE 5 MG PO TABS
5.0000 mg | ORAL_TABLET | Freq: Once | ORAL | Status: AC
  Administered 2017-10-16: 5 mg via ORAL
  Filled 2017-10-16: qty 1

## 2017-10-16 MED ORDER — POTASSIUM CHLORIDE CRYS ER 20 MEQ PO TBCR: 20.0000 meq | EXTENDED_RELEASE_TABLET | Freq: Two times a day (BID) | ORAL | 0 refills | Status: AC

## 2017-10-16 NOTE — Progress Notes (Addendum)
C/o chest pain , hurts to breath, placed on 2.5lnc, family at bedside EKG obtained.

## 2017-10-16 NOTE — Progress Notes (Addendum)
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  She had been making gurgling noises in her chest and wheezing for several days prior to admission.  Chest x-ray demonstrated a large right-sided pleural effusion.  She is anemic with a hemoglobin of 7.2.  Occult stool was negative.  Her oxygen saturation prior to coming to the emergency department was 70% on room air and she was started on nasal cannula.  She was transfused 2 units of blood and underwent a thoracentesis on 12/12 with removal of 1.8 L of dark fluid.  Assessment & Plan:  Acute hypoxic respiratory distress due to large right-sided exudative effusion by LDH criteria.  Dark fluid concerning for malignancy.  Alternatively, could be secondary to pneumonia.   -Continue levofloxacin  - blood cultures NGTD - Sputum culture unable to be obtained -  pleural fluid culture no growth to date -  F/u pleural fluid cytology -Thoracentesis labs consistent with exudative effusion.  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  Although iron levels and saturation are low, TIBC is low and ferritin is normal, suggestive of chronic disease.  b12 elevated, folate normal.  Hemoglobin appears to have stabilized near 9.2 g/dL -  Transfused 2 unit PRBC on 12/11  Demand ischemia due to anemia and pleural effusion -Troponin first set is 0.67, subsequent troponin 0 0.19, 0.19, 0.15  - EKG is negative for any acute ST-T changes.  - no regional wall motion abnormalities on ECHO  Hyponatremia, resolving with diuresis -Repeat BMP in a.m.  History of atrial fibrillation - Previously determined not on anticoagulation due to her history of thrombocytopenia.  GIST with Liver Mets -  Follows at [facility], per family this is stable.   -  Gleevec held due to anemia  History of acoustic neuroma -Resected in 1995  History of breast cancer status post mastectomy -Currently appears to be in remission  Essential hypertension - Currently borderline low blood pressure.  Will closely monitor.  Will treat with IV medications as needed  Alzheimer's dementia -Continue home medications.  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  Unusual that she has been diuresing well for the last several days but still having worsening pulmonary edema and effusions on chest x-ray.  Consultants:   [facility]  Procedures:  Thoracentesis on 12/12  Antimicrobials:  Anti-infectives (From admission, onward)  Start     Dose/Rate Route Frequency Ordered Stop   10/16/17 0000  levofloxacin (LEVAQUIN) 750 MG tablet     750 mg Oral Daily 10/16/17 1005 10/26/17 2359   10/15/17 2000  levofloxacin  (LEVAQUIN) IVPB 750 mg     750 mg 100 mL/hr over 90 Minutes Intravenous Every 24 hours 10/14/17 2200     10/14/17 2000  levofloxacin (LEVAQUIN) IVPB 750 mg  Status:  Discontinued     750 mg 100 mL/hr over 90 Minutes Intravenous Every 48 hours 10/13/17 2132 10/14/17 2200   10/13/17 1930  cefTRIAXone (ROCEPHIN) 1 g in dextrose 5 % 50 mL IVPB     1 g 100 mL/hr over 30 Minutes Intravenous  Once 10/13/17 1915 10/13/17 2058   10/13/17 1930  azithromycin (ZITHROMAX) tablet 500 mg     500 mg Oral  Once 10/13/17 1915 10/13/17 2016       Subjective:  Confused about why she is here.  Denies chest pains, SOB, cough, nausea, abdominal pains.  Private aide who is at bedside states that she seems to be breathing more easily than she was when she first came in to the hospital.  Patient has not been complaining about shortness of breath but she has been coughing quite a bit.  Objective: Vitals:   10/16/17 0618 10/16/17 0633 10/16/17 1415 10/16/17 1424  BP: 125/71  (!) 114/59   Pulse: 69  (!) 131   Resp: 20     Temp: 98.7 F (37.1 C)  98.2 F (36.8 C)   TempSrc: Oral  Oral   SpO2: 98% 99% 92% 95%  Weight:      Height:        Intake/Output Summary (Last 24 hours) at 10/16/2017 1519 Last data filed at 10/16/2017 1300 Gross per 24 hour  Intake 390 ml  Output 3350 ml  Net -2960 ml   Filed Weights   10/14/17 1417  Weight: 96.6 kg (213 lb)    Examination:  General exam:  Adult female, confused.  No acute distress.  HEENT:  NCAT, MMM Respiratory system:   Managed at the right base with rales heard at the bilateral bases  cardiovascular system: Regular rate and rhythm, normal S1/S2. No murmurs, rubs, gallops or clicks.  Warm extremities Gastrointestinal system: Normal active bowel sounds, soft, nondistended, nontender. MSK:  Normal tone and bulk, no lower extremity edema Neuro: Right facial droop  Data Reviewed: I have personally reviewed following labs and imaging  studies  CBC: Recent Labs  Lab 10/13/17 1750 10/14/17 1552 10/15/17 0418 10/16/17 0607  WBC 3.3* 2.5* 6.7 4.6  NEUTROABS 3.0  --   --   --   HGB 7.2* 10.0* 8.7* 9.2*  HCT 21.2* 29.2*  29.2* 25.6* 27.3*  MCV 98.1 93.6 93.1 95.1  PLT 140* 147* 141* 831*   Basic Metabolic Panel: Recent Labs  Lab 10/13/17 1750 10/14/17 1552 10/15/17 0418 10/16/17 0607  NA 130* 132* 133* 138  K 3.9 4.1 3.8 3.3*  CL 96* 98* 100* 104  CO2 24 27 27 29   GLUCOSE 126* 111* 98 98  BUN 15 14 19 19   CREATININE 0.74 0.77 0.89 0.82  CALCIUM 8.4* 8.2* 8.0* 8.0*   GFR: Estimated Creatinine Clearance: 54.6 mL/min (by C-G formula based on SCr of 0.82 mg/dL). Liver Function Tests: Recent Labs  Lab 10/13/17 1750 10/14/17 1552  AST 48* 43*  ALT 16 15  ALKPHOS 66 62  BILITOT 0.7 0.9  PROT 5.6* 5.3*  ALBUMIN 3.3*  3.0*   No results for input(s): LIPASE, AMYLASE in the last 168 hours. No results for input(s): AMMONIA in the last 168 hours. Coagulation Profile: Recent Labs  Lab 10/14/17 1552  INR 1.25   Cardiac Enzymes: Recent Labs  Lab 10/13/17 1750 10/14/17 1552 10/14/17 2227 10/15/17 0418  TROPONINI 0.67* 0.19* 0.19* 0.15*   BNP (last 3 results) No results for input(s): PROBNP in the last 8760 hours. HbA1C: No results for input(s): HGBA1C in the last 72 hours. CBG: No results for input(s): GLUCAP in the last 168 hours. Lipid Profile: No results for input(s): CHOL, HDL, LDLCALC, TRIG, CHOLHDL, LDLDIRECT in the last 72 hours. Thyroid Function Tests: Recent Labs    10/13/17 2127  TSH 0.919   Anemia Panel: Recent Labs    10/13/17 2127 10/15/17 0418  VITAMINB12 1,486*  --   FOLATE  --  6.7  FERRITIN 160  --   TIBC 252  --   IRON 16*  --    Urine analysis:    Component Value Date/Time   COLORURINE YELLOW 10/13/2017 1918   APPEARANCEUR CLEAR 10/13/2017 1918   LABSPEC 1.013 10/13/2017 1918   PHURINE 5.0 10/13/2017 1918   GLUCOSEU NEGATIVE 10/13/2017 1918   HGBUR SMALL  (A) 10/13/2017 1918   BILIRUBINUR NEGATIVE 10/13/2017 1918   BILIRUBINUR negative 04/05/2016 1414   KETONESUR 5 (A) 10/13/2017 1918   PROTEINUR NEGATIVE 10/13/2017 1918   UROBILINOGEN 0.2 04/05/2016 1414   UROBILINOGEN 1.0 07/07/2015 1901   NITRITE NEGATIVE 10/13/2017 1918   LEUKOCYTESUR NEGATIVE 10/13/2017 1918   Sepsis Labs: @LABRCNTIP (procalcitonin:4,lacticidven:4)  ) Recent Results (from the past 240 hour(s))  Culture, blood (routine x 2)     Status: None (Preliminary result)   Collection Time: 10/13/17  8:03 PM  Result Value Ref Range Status   Specimen Description BLOOD LEFT ANTECUBITAL  Final   Special Requests   Final    BOTTLES DRAWN AEROBIC AND ANAEROBIC Blood Culture adequate volume   Culture   Final    NO GROWTH 2 DAYS Performed at [facility], [place] [address] [place], [place] [address]    Report Status PENDING  Incomplete  Culture, blood (routine x 2)     Status: None (Preliminary result)   Collection Time: 10/13/17  9:27 PM  Result Value Ref Range Status   Specimen Description BLOOD LEFT ARM  Final   Special Requests   Final    BOTTLES DRAWN AEROBIC AND ANAEROBIC Blood Culture adequate volume   Culture   Final    NO GROWTH 2 DAYS Performed at [facility], [address] [place], [place] [address]    Report Status PENDING  Incomplete  Body fluid culture     Status: None (Preliminary result)   Collection Time: 10/14/17  2:47 PM  Result Value Ref Range Status   Specimen Description PLEURAL  Final   Special Requests RIGHT  Final   Gram Stain   Final    FEW WBC PRESENT, PREDOMINANTLY MONONUCLEAR NO ORGANISMS SEEN    Culture   Final    NO GROWTH 1 DAY Performed at [facility], [place] [address] [place], [place] [address]    Report Status PENDING  Incomplete      Radiology Studies: Dg Chest 1 View  Result Date: 10/14/2017 CLINICAL DATA:  Right-sided thoracentesis for pleural effusion. History of breast cancer and gastrointestinal  stromal tumor. EXAM: CHEST 1 VIEW COMPARISON:  10/13/2017 FINDINGS: Heart is top normal with thoracic aortic atherosclerosis.  Near complete clearing of right-sided pleural effusion without pneumothorax status post right-sided thoracentesis. Probable tiny left pleural effusion blunting the left lateral costophrenic angle. Bibasilar and left mid lung atelectasis. Osteoarthritis of the AC glenohumeral joints. No suspicious osseous lesions. Surgical clips are seen in the expected location of right thyroid gland. IMPRESSION: 1. Near complete resolution of right sided pleural effusion. 2. No pneumothorax is identified. 3. Bibasilar and left mid lung atelectasis. Probable tiny left pleural effusion. 4. Stable cardiomegaly with aortic atherosclerosis. Electronically Signed   By: Ashley Royalty M.D.   On: 10/14/2017 15:50   Dg Chest Port 1 View  Result Date: 10/16/2017 CLINICAL DATA:  Pleural effusion and cough. EXAM: PORTABLE CHEST 1 VIEW COMPARISON:  One-view chest x-ray 10/14/2017 aunt two-view chest x-ray 10/13/2017 FINDINGS: Heart is enlarged. Atherosclerotic calcifications are present at the aortic arch. Bilateral lower lobe airspace disease has increased. Superior segment right lower lobe or right upper lobe airspace disease is now present as well. There is moderate pulmonary vascular congestion. The right pleural effusion is reaccumulating. A left pleural effusion has increased. IMPRESSION: 1. Progressive bilateral airspace disease/pneumonia. 2. Increasing bilateral pleural effusions. 3.  Aortic Atherosclerosis (ICD10-I70.0). Electronically Signed   By: San Morelle M.D.   On: 10/16/2017 10:36     Scheduled Meds: . docusate sodium  100 mg Oral BID  . donepezil  10 mg Oral QHS  . fluticasone  1 spray Each Nare Daily  . furosemide  40 mg Intravenous BID  . ipratropium-albuterol  3 mL Nebulization TID  . losartan  25 mg Oral Daily  . mirabegron ER  50 mg Oral QHS  . vitamin B-12  1,000 mcg Oral BID    Continuous Infusions: . levofloxacin (LEVAQUIN) IV Stopped (10/15/17 2230)     LOS: 3 days    Time spent: 30 min    Janece Canterbury, MD [facility] Pager [phone]  If 7PM-7AM, please contact night-coverage [email or web address] Password [email or web address] 10/16/2017, 3:19 PM

## 2017-10-16 NOTE — Progress Notes (Signed)
O2 91% on room air.  Dr. Sheran Fava notified via text page.

## 2017-10-17 LAB — CBC
HCT: 29.3 % — ABNORMAL LOW (ref 36.0–46.0)
HEMOGLOBIN: 9.4 g/dL — AB (ref 12.0–15.0)
MCH: 31.1 pg (ref 26.0–34.0)
MCHC: 32.1 g/dL (ref 30.0–36.0)
MCV: 97 fL (ref 78.0–100.0)
Platelets: 162 10*3/uL (ref 150–400)
RBC: 3.02 MIL/uL — AB (ref 3.87–5.11)
RDW: 16.5 % — ABNORMAL HIGH (ref 11.5–15.5)
WBC: 5.9 10*3/uL (ref 4.0–10.5)

## 2017-10-17 LAB — BASIC METABOLIC PANEL
ANION GAP: 8 (ref 5–15)
BUN: 20 mg/dL (ref 6–20)
CHLORIDE: 101 mmol/L (ref 101–111)
CO2: 30 mmol/L (ref 22–32)
Calcium: 7.9 mg/dL — ABNORMAL LOW (ref 8.9–10.3)
Creatinine, Ser: 0.76 mg/dL (ref 0.44–1.00)
GFR calc non Af Amer: >60 mL/min (ref >60)
Glucose, Bld: 104 mg/dL — ABNORMAL HIGH (ref 65–99)
POTASSIUM: 3.1 mmol/L — AB (ref 3.5–5.1)
Sodium: 139 mmol/L (ref 135–145)

## 2017-10-17 LAB — CHOLESTEROL, BODY FLUID: Cholesterol, Fluid: 72 mg/dL

## 2017-10-17 LAB — GLUCOSE, CAPILLARY: GLUCOSE-CAPILLARY: 107 mg/dL — AB (ref 65–99)

## 2017-10-17 LAB — MAGNESIUM: MAGNESIUM: 1.7 mg/dL (ref 1.7–2.4)

## 2017-10-17 MED ORDER — MAGNESIUM SULFATE 2 GM/50ML IV SOLN
2.0000 g | Freq: Once | INTRAVENOUS | Status: AC
  Administered 2017-10-17: 2 g via INTRAVENOUS
  Filled 2017-10-17 (×2): qty 50

## 2017-10-17 MED ORDER — GUAIFENESIN-DM 100-10 MG/5ML PO SYRP: 5.0000 mL | ORAL_SOLUTION | ORAL | Status: DC | PRN

## 2017-10-17 MED ORDER — POTASSIUM CHLORIDE CRYS ER 20 MEQ PO TBCR
40.0000 meq | EXTENDED_RELEASE_TABLET | ORAL | Status: AC
  Administered 2017-10-17 (×2): 40 meq via ORAL
  Filled 2017-10-17 (×2): qty 2

## 2017-10-17 MED ORDER — TRAMADOL HCL 50 MG PO TABS
50.0000 mg | ORAL_TABLET | Freq: Four times a day (QID) | ORAL | Status: AC | PRN
  Administered 2017-10-18 – 2017-10-20 (×2): 50 mg via ORAL
  Filled 2017-10-17 (×2): qty 1

## 2017-10-17 NOTE — Plan of Care (Signed)
  Elimination: Will not experience complications related to bowel motility 10/17/2017 1641 - Progressing by Dorene Sorrow, RN   Pain Managment: General experience of comfort will improve 10/17/2017 1641 - Progressing by Dorene Sorrow, RN   Skin Integrity: Risk for impaired skin integrity will decrease 10/17/2017 1641 - Progressing by Dorene Sorrow, RN

## 2017-10-17 NOTE — Progress Notes (Addendum)
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  She had been making gurgling noises in her chest and wheezing for several days prior to admission.  Chest x-ray demonstrated a large right-sided pleural effusion.  She is anemic with a hemoglobin of 7.2.  Occult stool was negative.  Her oxygen saturation prior to coming to the emergency department was 70% on room air and she was started on nasal cannula.  She was transfused 2 units of blood and underwent a thoracentesis on 12/12 with removal of 1.8 L of dark fluid.  She was diuresed but despite diuresis, she had rapid reaccumulation of her effusion with development of some interstitial edema bilaterally consistent with heart failure.  Assessment & Plan:  Acute hypoxic respiratory distress due to large right-sided exudative pleural effusion by LDH criteria.  Dark fluid concerning for malignancy.  Alternatively, could be secondary to pneumonia.   -Continue levofloxacin  - blood cultures NGTD -  pleural fluid culture no growth  -  Cytology: Reactive mesothelial cells, no malignant cells identified - Echocardiogram: Grade 1 diastolic dysfunction with elevated left and right heart filling pressures -Continue Lasix 40 mg IV twice daily - Neg 2.4 L -Repeat chest x-ray in the morning -  F/u with pulmonology in 1 week to review cytology and discuss further treatment options of pleural effusion, appointment scheduled  Anemia due to malignancy, occult stool negative.  Although iron  levels and saturation are low, TIBC is low and ferritin is normal, suggestive of chronic disease.  b12 elevated, folate normal.  Hemoglobin appears to have stabilized near 9.2 g/dL -  Transfused 2 unit PRBC on 12/11  Demand ischemia due to anemia and pleural effusion -Troponin first set is 0.67, subsequent troponin 0 0.19, 0.19, 0.15  - EKG is negative for any acute ST-T changes.  - no regional wall motion abnormalities on ECHO  Hyponatremia, resolving with diuresis -Repeat BMP in a.m.  History of atrial fibrillation - Not on anticoagulation due to her history of thrombocytopenia.  GIST with Liver Mets -  Follows at [facility], per family this is stable.   -  Gleevec held due to anemia  History of acoustic neuroma -Resected in 1995  History of breast cancer status post mastectomy -Currently appears to be in remission  Essential hypertension - Currently borderline low blood pressure.  Will closely monitor.  Will treat with IV medications as needed  Alzheimer's dementia -Continue home medications.  Tele:  NSR with 1st degree heart block  Hypokalemia, likely to secondary to diuresis, oral potassium repletion  Left chest wall pain, appears to be resolved today.  Was very tender to palpation earlier.  No signs of overlying rash.  EKG appears stable.  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  We will repeat her chest x-ray in the morning.  Consultants:   [facility]  Procedures:  Thoracentesis  on 12/12  Antimicrobials:  Anti-infectives (From admission, onward)   Start     Dose/Rate Route Frequency Ordered Stop   10/16/17 0000  levofloxacin (LEVAQUIN) 750 MG tablet     750 mg Oral Daily 10/16/17 1005 10/26/17 2359   10/15/17 2000  levofloxacin  (LEVAQUIN) IVPB 750 mg     750 mg 100 mL/hr over 90 Minutes Intravenous Every 24 hours 10/14/17 2200     10/14/17 2000  levofloxacin (LEVAQUIN) IVPB 750 mg  Status:  Discontinued     750 mg 100 mL/hr over 90 Minutes Intravenous Every 48 hours 10/13/17 2132 10/14/17 2200   10/13/17 1930  cefTRIAXone (ROCEPHIN) 1 g in dextrose 5 % 50 mL IVPB     1 g 100 mL/hr over 30 Minutes Intravenous  Once 10/13/17 1915 10/13/17 2058   10/13/17 1930  azithromycin (ZITHROMAX) tablet 500 mg     500 mg Oral  Once 10/13/17 1915 10/13/17 2016       Subjective:  Confused, poor historian due to memory loss.  Her family states that she was having some left chest pain last night that was very tender to palpation even to light palpation and seem to be skin pain more than deep pain.  EKG was performed overnight which appears stable.  Her pains resolved with Tylenol.  Objective: Vitals:   10/17/17 0936 10/17/17 0947 10/17/17 1431 10/17/17 1440  BP: (!) 118/49   (!) 101/53  Pulse: (!) 122   84  Resp: 20   16  Temp:    99.6 F (37.6 C)  TempSrc:    Axillary  SpO2: 98% 94% 91% 100%  Weight:      Height:        Intake/Output Summary (Last 24 hours) at 10/17/2017 1845 Last data filed at 10/17/2017 1822 Gross per 24 hour  Intake 720 ml  Output 2750 ml  Net -2030 ml   Filed Weights   10/14/17 1417 10/16/17 2108  Weight: 96.6 kg (213 lb) 104.4 kg (230 lb 1.6 oz)    Examination:  General exam:  Adult female, confused.  No acute distress.  HEENT:  NCAT, MMM Respiratory system: Rales at the bilateral bases, no wheezing, positive rhonchi Cardiovascular system: Regular rate and rhythm, normal S1/S2. No murmurs, rubs, gallops or clicks.  Warm extremities Gastrointestinal system: Normal active bowel sounds, soft, nondistended, nontender. MSK:  Normal tone and bulk, no lower extremity edema Neuro:  Right facial droop  Data Reviewed: I have personally reviewed following labs and imaging  studies  CBC: Recent Labs  Lab 10/13/17 1750 10/14/17 1552 10/15/17 0418 10/16/17 0607 10/17/17 0634  WBC 3.3* 2.5* 6.7 4.6 5.9  NEUTROABS 3.0  --   --   --   --   HGB 7.2* 10.0* 8.7* 9.2* 9.4*  HCT 21.2* 29.2*  29.2* 25.6* 27.3* 29.3*  MCV 98.1 93.6 93.1 95.1 97.0  PLT 140* 147* 141* 140* 149   Basic Metabolic Panel: Recent Labs  Lab 10/13/17 1750 10/14/17 1552 10/15/17 0418 10/16/17 0607 10/17/17 0634  NA 130* 132* 133* 138 139  K 3.9 4.1 3.8 3.3* 3.1*  CL 96* 98* 100* 104 101  CO2 24 27 27 29 30   GLUCOSE 126* 111* 98 98 104*  BUN 15 14 19 19 20   CREATININE 0.74 0.77 0.89 0.82 0.76  CALCIUM 8.4* 8.2* 8.0* 8.0* 7.9*  MG  --   --   --   --  1.7   GFR: Estimated Creatinine Clearance: 58.3 mL/min (by  C-G formula based on SCr of 0.76 mg/dL). Liver Function Tests: Recent Labs  Lab 10/13/17 1750 10/14/17 1552  AST 48* 43*  ALT 16 15  ALKPHOS 66 62  BILITOT 0.7 0.9  PROT 5.6* 5.3*  ALBUMIN 3.3* 3.0*   No results for input(s): LIPASE, AMYLASE in the last 168 hours. No results for input(s): AMMONIA in the last 168 hours. Coagulation Profile: Recent Labs  Lab 10/14/17 1552  INR 1.25   Cardiac Enzymes: Recent Labs  Lab 10/13/17 1750 10/14/17 1552 10/14/17 2227 10/15/17 0418  TROPONINI 0.67* 0.19* 0.19* 0.15*   BNP (last 3 results) No results for input(s): PROBNP in the last 8760 hours. HbA1C: No results for input(s): HGBA1C in the last 72 hours. CBG: Recent Labs  Lab 10/17/17 0736  GLUCAP 107*   Lipid Profile: No results for input(s): CHOL, HDL, LDLCALC, TRIG, CHOLHDL, LDLDIRECT in the last 72 hours. Thyroid Function Tests: No results for input(s): TSH, T4TOTAL, FREET4, T3FREE, THYROIDAB in the last 72 hours. Anemia Panel: Recent Labs    10/15/17 0418  FOLATE 6.7   Urine analysis:    Component Value Date/Time   COLORURINE YELLOW 10/13/2017 1918   APPEARANCEUR CLEAR 10/13/2017 1918   LABSPEC 1.013 10/13/2017 1918   PHURINE 5.0  10/13/2017 1918   GLUCOSEU NEGATIVE 10/13/2017 1918   HGBUR SMALL (A) 10/13/2017 1918   BILIRUBINUR NEGATIVE 10/13/2017 1918   BILIRUBINUR negative 04/05/2016 1414   KETONESUR 5 (A) 10/13/2017 1918   PROTEINUR NEGATIVE 10/13/2017 1918   UROBILINOGEN 0.2 04/05/2016 1414   UROBILINOGEN 1.0 07/07/2015 1901   NITRITE NEGATIVE 10/13/2017 1918   LEUKOCYTESUR NEGATIVE 10/13/2017 1918   Sepsis Labs: @LABRCNTIP (procalcitonin:4,lacticidven:4)  ) Recent Results (from the past 240 hour(s))  Culture, blood (routine x 2)     Status: None (Preliminary result)   Collection Time: 10/13/17  8:03 PM  Result Value Ref Range Status   Specimen Description BLOOD LEFT ANTECUBITAL  Final   Special Requests   Final    BOTTLES DRAWN AEROBIC AND ANAEROBIC Blood Culture adequate volume   Culture   Final    NO GROWTH 3 DAYS Performed at [facility], [place] [address] [place], [place] [address]    Report Status PENDING  Incomplete  Culture, blood (routine x 2)     Status: None (Preliminary result)   Collection Time: 10/13/17  9:27 PM  Result Value Ref Range Status   Specimen Description BLOOD LEFT ARM  Final   Special Requests   Final    BOTTLES DRAWN AEROBIC AND ANAEROBIC Blood Culture adequate volume   Culture   Final    NO GROWTH 3 DAYS Performed at [facility], [address] [place], [place] [address]    Report Status PENDING  Incomplete  Body fluid culture     Status: None (Preliminary result)   Collection Time: 10/14/17  2:47 PM  Result Value Ref Range Status   Specimen Description PLEURAL  Final   Special Requests RIGHT  Final   Gram Stain   Final    FEW WBC PRESENT, PREDOMINANTLY MONONUCLEAR NO ORGANISMS SEEN    Culture   Final    NO GROWTH 2 DAYS Performed at [facility], [place] [address] [place], [place] [address]    Report Status PENDING  Incomplete      Radiology Studies: Dg Chest Port 1 View  Result Date: 10/16/2017 CLINICAL DATA:  Pleural effusion and  cough. EXAM: PORTABLE CHEST 1 VIEW COMPARISON:  One-view  chest x-ray 10/14/2017 aunt two-view chest x-ray 10/13/2017 FINDINGS: Heart is enlarged. Atherosclerotic calcifications are present at the aortic arch. Bilateral lower lobe airspace disease has increased. Superior segment right lower lobe or right upper lobe airspace disease is now present as well. There is moderate pulmonary vascular congestion. The right pleural effusion is reaccumulating. A left pleural effusion has increased. IMPRESSION: 1. Progressive bilateral airspace disease/pneumonia. 2. Increasing bilateral pleural effusions. 3.  Aortic Atherosclerosis (ICD10-I70.0). Electronically Signed   By: San Morelle M.D.   On: 10/16/2017 10:36     Scheduled Meds: . docusate sodium  100 mg Oral BID  . donepezil  10 mg Oral QHS  . fluticasone  1 spray Each Nare Daily  . furosemide  40 mg Intravenous BID  . ipratropium-albuterol  3 mL Nebulization TID  . losartan  25 mg Oral Daily  . mirabegron ER  50 mg Oral QHS  . vitamin B-12  1,000 mcg Oral BID   Continuous Infusions: . levofloxacin (LEVAQUIN) IV Stopped (10/16/17 2355)  . magnesium sulfate 1 - 4 g bolus IVPB       LOS: 4 days    Time spent: 30 min    Janece Canterbury, MD [facility] Pager [phone]  If 7PM-7AM, please contact night-coverage [email or web address] Password [email or web address] 10/17/2017, 6:45 PM

## 2017-10-17 NOTE — Progress Notes (Signed)
PHARMACY NOTE -  levaquin  Pharmacy has been assisting with dosing of levaquin for PNA. Dosage remains stable at 750 mg IV q24h and need for further dosage adjustment appears unlikely at present.    Will sign off at this time.  Please reconsult if a change in clinical status warrants re-evaluation of dosage.  Dia Sitter, PharmD, BCPS 10/17/2017 1:00 PM

## 2017-10-18 ENCOUNTER — Encounter (HOSPITAL_COMMUNITY): Payer: Self-pay | Admitting: Radiology

## 2017-10-18 ENCOUNTER — Inpatient Hospital Stay (HOSPITAL_COMMUNITY): Payer: Medicare Other

## 2017-10-18 DIAGNOSIS — J90 Pleural effusion, not elsewhere classified: Principal | ICD-10-CM

## 2017-10-18 LAB — CBC
HCT: 30.2 % — ABNORMAL LOW (ref 36.0–46.0)
Hemoglobin: 10 g/dL — ABNORMAL LOW (ref 12.0–15.0)
MCH: 31.9 pg (ref 26.0–34.0)
MCHC: 33.1 g/dL (ref 30.0–36.0)
MCV: 96.5 fL (ref 78.0–100.0)
Platelets: 160 10*3/uL (ref 150–400)
RBC: 3.13 MIL/uL — ABNORMAL LOW (ref 3.87–5.11)
RDW: 16.4 % — AB (ref 11.5–15.5)
WBC: 5.9 10*3/uL (ref 4.0–10.5)

## 2017-10-18 LAB — BODY FLUID CULTURE: CULTURE: NO GROWTH

## 2017-10-18 LAB — BASIC METABOLIC PANEL
Anion gap: 7 (ref 5–15)
BUN: 23 mg/dL — AB (ref 6–20)
CHLORIDE: 102 mmol/L (ref 101–111)
CO2: 30 mmol/L (ref 22–32)
Calcium: 7.7 mg/dL — ABNORMAL LOW (ref 8.9–10.3)
Creatinine, Ser: 0.74 mg/dL (ref 0.44–1.00)
GFR calc Af Amer: >60 mL/min (ref >60)
GFR calc non Af Amer: >60 mL/min (ref >60)
GLUCOSE: 112 mg/dL — AB (ref 65–99)
POTASSIUM: 3.2 mmol/L — AB (ref 3.5–5.1)
Sodium: 139 mmol/L (ref 135–145)

## 2017-10-18 MED ORDER — DEXTROSE 5 % IV SOLN
1.0000 g | INTRAVENOUS | Status: DC
  Administered 2017-10-18 – 2017-10-21 (×3): 1 g via INTRAVENOUS
  Filled 2017-10-18 (×5): qty 10

## 2017-10-18 MED ORDER — POTASSIUM CHLORIDE CRYS ER 20 MEQ PO TBCR
40.0000 meq | EXTENDED_RELEASE_TABLET | ORAL | Status: AC
  Administered 2017-10-18 (×3): 40 meq via ORAL
  Filled 2017-10-18 (×3): qty 2

## 2017-10-18 MED ORDER — IPRATROPIUM-ALBUTEROL 0.5-2.5 (3) MG/3ML IN SOLN
3.0000 mL | Freq: Two times a day (BID) | RESPIRATORY_TRACT | Status: DC
  Administered 2017-10-18 – 2017-10-20 (×5): 3 mL via RESPIRATORY_TRACT
  Filled 2017-10-18 (×5): qty 3

## 2017-10-18 MED ORDER — METRONIDAZOLE IN NACL 5-0.79 MG/ML-% IV SOLN
500.0000 mg | Freq: Three times a day (TID) | INTRAVENOUS | Status: DC
  Administered 2017-10-18 – 2017-10-19 (×2): 500 mg via INTRAVENOUS
  Filled 2017-10-18 (×3): qty 100

## 2017-10-18 MED ORDER — FUROSEMIDE 40 MG PO TABS: 40.0000 mg | ORAL_TABLET | Freq: Every day | ORAL | Status: DC

## 2017-10-18 NOTE — Evaluation (Signed)
Clinical/Bedside Swallow Evaluation Patient Details  Name: Melanie Trevino MRN: [id number] Date of Birth: 11/04/1927  Today's Date: 10/18/2017 Time: SLP Start Time (ACUTE ONLY): 6606 SLP Stop Time (ACUTE ONLY): 1840 SLP Time Calculation (min) (ACUTE ONLY): 20 min  Past Medical History:  Past Medical History:  Diagnosis Date  . Breast cancer ([place]) 08/18/2012  . Cancer ([place])   . Chronic indwelling Foley catheter 07/08/2015  . Dementia of the Alzheimer's type 07/08/2015  . 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 High cholesterol   . Hypertension   . Liver metastasis ([place]) 07/08/2015  . Renal mass 07/08/2015  . Wheelchair bound 07/08/2015   Past Surgical History:  Past Surgical History:  Procedure Laterality Date  . 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 She had been making gurgling noises in her chest and wheezing for several days prior to admission. Chest x-ray demonstrated a large right-sided pleural effusion. Per MD concern for aspiration PNA. Chest CT 10/18/17: Patchy airspace opacities in both lungs worrisome for pneumonia, fluid in the airways suggests aspiration.   Assessment / Plan / Recommendation Clinical Impression  Patient presents with delayed, productive coughing; as pt is coughing at baseline per her daughter-in-law, this complicates differential diagnosis. No history of dysphagia found in chart. Pt does have right sided facial weakness impacting labial seal, reportedly occuring after resection of her acoustic neuroma. Despite facial weakness, no anterior loss and pt has good oral  clearance. Her swallow appears timely, and pt able to swallow consecutive sips of thin liquids in excess of 3 oz without overt signs of aspiration, even with larger pills administered by RN during my assessment. Discussed possibility of instrumental testing (MBS) with pt and her daughter-in-law to rule out silent aspiration given MD concerns for aspiration PNA. They wish to pursue. Will f/u next date for MBS. Pt may continue current diet, medications whole with liquid. Will update recommendations following MBS.  SLP Visit Diagnosis: Dysphagia, unspecified (R13.10)    Aspiration Risk  Mild aspiration risk    Diet Recommendation Regular;Thin liquid   Liquid Administration via: Cup;Straw Medication Administration: Whole meds with liquid Supervision: Patient able to self feed Compensations: Slow rate;Small sips/bites    Other  Recommendations Oral Care Recommendations: Oral care BID   Follow up Recommendations Other (comment)      Frequency and Duration            Prognosis Prognosis for Safe Diet Advancement: Good      Swallow Study   General Date of Onset: 10/13/17 HPI: The patient is an 83 year old female with 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 She had been making gurgling noises in her chest and wheezing for several days prior to admission. Chest x-ray demonstrated a large right-sided pleural effusion. Per MD concern for aspiration PNA. Chest CT 10/18/17: Patchy airspace opacities in both lungs worrisome for pneumonia, fluid in the airways suggests aspiration. Type  of Study: Bedside Swallow Evaluation Previous Swallow Assessment: none in chart Diet Prior to  this Study: Regular;Thin liquids Temperature Spikes Noted: No Respiratory Status: Room air History of Recent Intubation: No Behavior/Cognition: Alert;Cooperative;Pleasant mood Oral Cavity Assessment: Within Functional Limits Oral Care Completed by SLP: No Oral Cavity - Dentition: Adequate natural dentition Vision: Functional for self-feeding Self-Feeding Abilities: Able to feed self Patient Positioning: Upright in bed Baseline Vocal Quality: Normal Volitional Cough: Strong Volitional Swallow: Able to elicit    Oral/Motor/Sensory Function Overall Oral Motor/Sensory Function: Moderate impairment Facial ROM: Reduced right Facial Symmetry: Abnormal symmetry right Facial Strength: Reduced right Facial Sensation: Within Functional Limits Lingual ROM: Within Functional Limits Lingual Symmetry: Within Functional Limits Lingual Strength: Within Functional Limits Lingual Sensation: Within Functional Limits Velum: Within Functional Limits Mandible: Within Functional Limits   Ice Chips Ice chips: Not tested   Thin Liquid Thin Liquid: Impaired Oral Phase Impairments: Reduced labial seal Pharyngeal  Phase Impairments: Cough - Delayed    Nectar Thick Nectar Thick Liquid: Not tested   Honey Thick Honey Thick Liquid: Not tested   Puree Puree: Impaired Presentation: Self Fed;Spoon Pharyngeal Phase Impairments: Cough - Delayed   Solid   GO   Solid: Impaired Pharyngeal Phase Impairments: Cough - Delayed       Deneise Lever, MS, CCC-SLP Speech-Language Pathologist  REMONA BOOM 10/18/2017,7:01 PM

## 2017-10-18 NOTE — Progress Notes (Signed)
PT Cancellation Note  Patient Details Name: ARNETIA BRONK MRN: [id number] DOB: 18-Oct-1928   Cancelled Treatment:    Reason Eval/Treat Not Completed: Other (comment) Per nursing, patient requireds total care, OOB via lift today. Note Palliative care consulted. Will await any GOC decisions.   Claretha Cooper 10/18/2017, 4:13 PM  Tresa Endo PT [phone]

## 2017-10-18 NOTE — Progress Notes (Signed)
Pharmacy Antibiotic Note  Melanie Trevino is a 83 y.o. female known to pharmacy from abx consult with this admission.  Zosyn was ordered for asp PNA but patient has PCN allergy.  Spoke to Dr. Rennis Harding, to change abx to ceftriaxone and flagyl.  Plan: - ceftriaxone 1gm IV q24h - flagyl 500 mg IV q8h - no renal adjustment is needed for the above abx.  Pharmacy will sign off.  _________________________________  Height: 5\' 4"  (162.6 cm) Weight: 202 lb 1.6 oz (91.7 kg) IBW/kg (Calculated) : 54.7  Temp (24hrs), Avg:97.9 F (36.6 C), Min:97.5 F (36.4 C), Max:98.3 F (36.8 C)  Recent Labs  Lab 10/14/17 1552 10/15/17 0418 10/16/17 0607 10/17/17 0634 10/18/17 0556  WBC 2.5* 6.7 4.6 5.9 5.9  CREATININE 0.77 0.89 0.82 0.76 0.74    Estimated Creatinine Clearance: 54.4 mL/min (by C-G formula based on SCr of 0.74 mg/dL).    Allergies  Allergen Reactions  . Shellfish Allergy Anaphylaxis  . Other Other (See Comments)    HORSE SERUMYDrug[Other] swelling6/09/2006 12:00:00 AM by Erin Hearing CPhT  . Amoxicillin Swelling and Rash    Has patient had a PCN reaction causing immediate rash, facial/tongue/throat swelling, SOB or lightheadedness with hypotension: UNKNOWN Has patient had a PCN reaction causing severe rash involving mucus membranes or skin necrosis: Unknown Has patient had a PCN reaction that required hospitalization: Unknown Has patient had a PCN reaction occurring within the last 10 years: Unknown If all of the above answers are "NO", then may proceed with Cephalosporin use.     Antimicrobials this admission:  12/11 CTX/azith x1 12/12 lvq: 12/16 12/16 ceftriaxone + flagyl (for asp PNA, has PCN allergy)>>  Dose adjustments this admission:  --  Microbiology results:  12/11 BCx x2: NGTD 12/12 Pleural fluid cx: neg FINAL Thank you for allowing pharmacy to be a part of this patient's care.  Lynelle Doctor 10/18/2017 6:15 PM

## 2017-10-18 NOTE — Progress Notes (Signed)
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  She had been making gurgling noises in her chest and wheezing for several days prior to admission.  Chest x-ray demonstrated a large right-sided pleural effusion.  She is anemic with a hemoglobin of 7.2.  Occult stool was negative.  Her oxygen saturation prior to coming to the emergency department was 70% on room air and she was started on nasal cannula.  She was transfused 2 units of blood and underwent a thoracentesis on 12/12 with removal of 1.8 L of dark fluid.  She was diuresed but despite diuresis, she had rapid reaccumulation of her effusion with development of some interstitial edema bilaterally consistent with heart failure.  Her diuresis was increased, and despite being 1-2 L negative daily for last several days her effusions have worsened.  She does not have any overt signs of aspiration however aspiration pneumonia would explain why she has not improved despite treatment with antibiotics and diuresis.  Speech therapy has been consulted.  Palliative care and pulmonology have also been consulted.  Assessment & Plan:  Acute hypoxic respiratory distress due to large right-sided exudative pleural effusion by LDH criteria.   -Continue levofloxacin day 5 -  Blood cultures NGTD -  pleural fluid culture no growth  -  Cytology: Reactive mesothelial cells, no malignant cells identified - Echocardiogram: Grade 1 diastolic dysfunction with elevated left and right heart filling  pressures -  D/c IV lasix due to hypotension and start daily lasix PO tomorrow - Neg 1.89 L - CXR:  Worsening effusions -  CT chest: Patchy airspace opacities in both lungs worrisome for pneumonia, fluid and airways suggest aspiration, bilateral pleural effusions right greater than left - Palliative care consultation - Pulmonology consultation  Incidental finding on CT:  Air within the biliary tree, likely contained.  Patient does not exhibits signs or symptoms of necrotic bowel or other abdominal catastrophe.    Anemia due to malignancy, occult stool negative.  Although iron levels and saturation are low, TIBC is low and ferritin is normal, suggestive of chronic disease.  b12 elevated, folate normal.  Hemoglobin appears to have stabilized near 9.2 g/dL -  Transfused 2 unit PRBC on 12/11  Demand ischemia due to anemia and pleural effusion -Troponin first set is 0.67, subsequent troponin 0 0.19, 0.19, 0.15  - EKG is negative for any acute ST-T changes.  - no regional wall motion abnormalities on ECHO  Hyponatremia, resolving with diuresis -Repeat BMP in a.m.  paroxysmal atrial fibrillation, not on anticoagulation due to her history of thrombocytopenia and deconditioning -  Tele:  Rate controlled PAF and SR -  Okay to d/c telemetry  GIST with Liver Mets -  Follows at [facility], per family this is stable.   -  Gleevec held due to anemia  History of acoustic neuroma -Resected in 1995  History of breast cancer status post mastectomy -Currently appears to be in remission  Essential hypertension, now with hypotension -  Decrease diuretics.  Alzheimer's dementia, continue aricept  Tele:  NSR with 1st degree heart block  Hypokalemia, likely  to secondary to diuresis, oral potassium repletion  Left chest wall pain, likely musculoskeletal, now resolved.  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  Palliative care and pulmonology consults.  SLP evaluation  Consultants:   RAdiology  Procedures:  Thoracentesis on 12/12  Antimicrobials:  Anti-infectives (From admission, onward)   Start     Dose/Rate Route Frequency Ordered Stop   10/16/17 0000  levofloxacin (LEVAQUIN) 750 MG tablet     750 mg Oral Daily 10/16/17 1005 10/26/17 2359   10/15/17 2000  levofloxacin (LEVAQUIN) IVPB 750 mg     750 mg 100 mL/hr over 90 Minutes Intravenous Every 24 hours 10/14/17 2200     10/14/17 2000  levofloxacin (LEVAQUIN) IVPB 750 mg  Status:  Discontinued     750 mg 100 mL/hr over 90 Minutes Intravenous Every 48 hours 10/13/17 2132 10/14/17 2200   10/13/17 1930  cefTRIAXone (ROCEPHIN) 1 g in dextrose 5 % 50 mL IVPB     1 g 100 mL/hr over 30 Minutes Intravenous  Once 10/13/17 1915 10/13/17 2058   10/13/17 1930  azithromycin (ZITHROMAX) tablet 500 mg     500 mg Oral  Once 10/13/17 1915 10/13/17 2016       Subjective:  History limited by patient's dementia and confusion.  Per family, she continues to cough and have periods of SOB.  Voiding frequently.    Objective: Vitals:   10/17/17 2046 10/18/17 0542 10/18/17 0946 10/18/17 1353  BP: (!) 115/57 134/86  (!) 94/58  Pulse: 85 81  (!) 108  Resp: 15 18  18   Temp: 97.8 F (36.6 C) 98.3 F (36.8 C)  (!) 97.5 F (36.4 C)  TempSrc: Oral Oral  Oral  SpO2: 91% 91% (!) 86% 92%  Weight: 98.1 kg (216 lb 4.8 oz) 91.7 kg (202 lb 1.6 oz)    Height:        Intake/Output Summary (Last 24 hours) at 10/18/2017 1527 Last data filed at 10/18/2017 1353 Gross per 24 hour  Intake 960 ml  Output 2050 ml  Net -1090 ml   Filed Weights   10/16/17 2108 10/17/17 2046 10/18/17 0542  Weight: 104.4 kg (230 lb 1.6 oz) 98.1 kg (216 lb 4.8 oz) 91.7 kg (202 lb 1.6 oz)    Examination:  General exam:  Adult female, confused.  No acute distress.  HEENT:  NCAT,  MMM Respiratory system:  Diminished throughout the right lung fields with rales heard bilaterally, positive rhonchi Cardiovascular system: Regular rate and rhythm, normal S1/S2. No murmurs, rubs, gallops or clicks.  Warm extremities Gastrointestinal system: Normal active bowel sounds, soft, nondistended, nontender. MSK:  Normal tone and bulk, no lower extremity edema Neuro:  Right facial droop  Data Reviewed: I have personally reviewed following labs and imaging studies  CBC: Recent Labs  Lab 10/13/17 1750 10/14/17 1552 10/15/17 0418 10/16/17 0607 10/17/17 0634 10/18/17 0556  WBC 3.3* 2.5* 6.7 4.6 5.9 5.9  NEUTROABS 3.0  --   --   --   --   --   HGB 7.2* 10.0* 8.7* 9.2* 9.4* 10.0*  HCT 21.2* 29.2*  29.2* 25.6* 27.3* 29.3* 30.2*  MCV 98.1 93.6 93.1 95.1 97.0 96.5  PLT 140* 147* 141* 140* 162 956   Basic Metabolic Panel: Recent Labs  Lab 10/14/17 1552 10/15/17 0418 10/16/17 0607 10/17/17  3614 10/18/17 0556  NA 132* 133* 138 139 139  K 4.1 3.8 3.3* 3.1* 3.2*  CL 98* 100* 104 101 102  CO2 27 27 29 30 30   GLUCOSE 111* 98 98 104* 112*  BUN 14 19 19 20  23*  CREATININE 0.77 0.89 0.82 0.76 0.74  CALCIUM 8.2* 8.0* 8.0* 7.9* 7.7*  MG  --   --   --  1.7  --    GFR: Estimated Creatinine Clearance: 54.4 mL/min (by C-G formula based on SCr of 0.74 mg/dL). Liver Function Tests: Recent Labs  Lab 10/13/17 1750 10/14/17 1552  AST 48* 43*  ALT 16 15  ALKPHOS 66 62  BILITOT 0.7 0.9  PROT 5.6* 5.3*  ALBUMIN 3.3* 3.0*   No results for input(s): LIPASE, AMYLASE in the last 168 hours. No results for input(s): AMMONIA in the last 168 hours. Coagulation Profile: Recent Labs  Lab 10/14/17 1552  INR 1.25   Cardiac Enzymes: Recent Labs  Lab 10/13/17 1750 10/14/17 1552 10/14/17 2227 10/15/17 0418  TROPONINI 0.67* 0.19* 0.19* 0.15*   BNP (last 3 results) No results for input(s): PROBNP in the last 8760 hours. HbA1C: No results for input(s): HGBA1C in the last 72  hours. CBG: Recent Labs  Lab 10/17/17 0736  GLUCAP 107*   Lipid Profile: No results for input(s): CHOL, HDL, LDLCALC, TRIG, CHOLHDL, LDLDIRECT in the last 72 hours. Thyroid Function Tests: No results for input(s): TSH, T4TOTAL, FREET4, T3FREE, THYROIDAB in the last 72 hours. Anemia Panel: No results for input(s): VITAMINB12, FOLATE, FERRITIN, TIBC, IRON, RETICCTPCT in the last 72 hours. Urine analysis:    Component Value Date/Time   COLORURINE YELLOW 10/13/2017 1918   APPEARANCEUR CLEAR 10/13/2017 1918   LABSPEC 1.013 10/13/2017 1918   PHURINE 5.0 10/13/2017 1918   GLUCOSEU NEGATIVE 10/13/2017 1918   HGBUR SMALL (A) 10/13/2017 1918   BILIRUBINUR NEGATIVE 10/13/2017 1918   BILIRUBINUR negative 04/05/2016 1414   KETONESUR 5 (A) 10/13/2017 1918   PROTEINUR NEGATIVE 10/13/2017 1918   UROBILINOGEN 0.2 04/05/2016 1414   UROBILINOGEN 1.0 07/07/2015 1901   NITRITE NEGATIVE 10/13/2017 1918   LEUKOCYTESUR NEGATIVE 10/13/2017 1918   Sepsis Labs: @LABRCNTIP (procalcitonin:4,lacticidven:4)  ) Recent Results (from the past 240 hour(s))  Culture, blood (routine x 2)     Status: None (Preliminary result)   Collection Time: 10/13/17  8:03 PM  Result Value Ref Range Status   Specimen Description BLOOD LEFT ANTECUBITAL  Final   Special Requests   Final    BOTTLES DRAWN AEROBIC AND ANAEROBIC Blood Culture adequate volume   Culture   Final    NO GROWTH 4 DAYS Performed at [facility], [place] [address], [place], [place] [address]    Report Status PENDING  Incomplete  Culture, blood (routine x 2)     Status: None (Preliminary result)   Collection Time: 10/13/17  9:27 PM  Result Value Ref Range Status   Specimen Description BLOOD LEFT ARM  Final   Special Requests   Final    BOTTLES DRAWN AEROBIC AND ANAEROBIC Blood Culture adequate volume   Culture   Final    NO GROWTH 4 DAYS Performed at [facility], [address] [place], [place] [address]    Report Status PENDING   Incomplete  Body fluid culture     Status: None   Collection Time: 10/14/17  2:47 PM  Result Value Ref Range Status   Specimen Description PLEURAL  Final   Special Requests RIGHT  Final  Gram Stain   Final    FEW WBC PRESENT, PREDOMINANTLY MONONUCLEAR NO ORGANISMS SEEN    Culture   Final    No growth aerobically or anaerobically. Performed at [facility], [place] [address], [place], [place] [address]    Report Status 10/18/2017 FINAL  Final      Radiology Studies: Ct Chest Wo Contrast  Result Date: 10/18/2017 CLINICAL DATA:  Breast cancer.  Pleural effusion. EXAM: CT CHEST WITHOUT CONTRAST TECHNIQUE: Multidetector CT imaging of the chest was performed following the standard protocol without IV contrast. COMPARISON:  None. FINDINGS: Cardiovascular: Atherosclerotic calcifications of the thoracic aorta. Advanced coronary artery calcifications of all 3 vessels. No evidence of aortic aneurysm. Maximal diameter of the ascending aorta is 3.8 cm. Mediastinum/Nodes: 13 mm Romyn Boswell axis diameter precarinal lymph node. No significant pericardial effusion. 2.0 cm left thyroid hypodensity. The esophagus is mildly distended. Lungs/Pleura: Moderate bilateral pleural effusions right greater than left. There is patchy airspace disease within the dependent right lung. Subsegmental atelectasis in the left lung. Mild patchy opacities in the anterior left upper lobe. There is a small amount of fluid layering in the right mainstem bronchus. Upper Abdomen: There is extensive arborized gas throughout the liver. There is a large amount of gas in the gallbladder. These findings would suggest that this is pneumobilia. However, gas does extend portal liver capsule and portal venous gas cannot be entirely excluded. Pneumobilia is favored. Gas bubbles adjacent of the gallbladder are favored represent gas within intestine. Large abdominal wall hernia contains loops of bowel. There is diffuse subcutaneous edema within the  fat of the visualized chest and abdomen. Musculoskeletal: In L1 compression deformity is partially imaged. There is a lytic area within the superior endplate which most likely represents a depressed component of the fracture. Lytic lesion cannot be excluded. IMPRESSION: Patchy airspace opacities in both lungs worrisome for pneumonia. Fluid in the airways suggests aspiration. Bilateral pleural effusions right greater than left Nonspecific gas in the liver. Pneumobilia is favored over portal venous gas. Correlate with a history of previous biliary instrumentation. After discussion with the attending physician, the patient has no abdominal symptoms and therefore again, pneumobilia is favored. L1 compression fracture as described. Pathologic fracture is not excluded. 2.0 cm left thyroid hypodensity.  Thyroid ultrasound is recommended. Aortic Atherosclerosis (ICD10-I70.0). Electronically Signed   By: Marybelle Killings M.D.   On: 10/18/2017 12:09   Dg Chest Port 1 View  Result Date: 10/18/2017 CLINICAL DATA:  83 year old female with shortness of breath an generalized weakness. Metastatic GIST. EXAM: PORTABLE CHEST 1 VIEW COMPARISON:  10/16/2017 and earlier. FINDINGS: Portable AP semi upright view at 0438 hours. Increasing fluid or thickening in the right minor fissure and mild progression of veiling opacity in the right lung since 10/16/2017. Increased confluent opacity at the left lung base since 10/14/2017. Left hemidiaphragm now mostly obscured. Stable cardiomegaly and mediastinal contours. Calcified aortic atherosclerosis. No pneumothorax. No acute pulmonary edema. Asymmetric obscuration of the right mainstem bronchus. Paucity bowel gas in the upper abdomen. IMPRESSION: 1. Increasing right pleural effusion with fluid now in the right minor fissure. 2. Asymmetric obscuration of the right mainstem bronchus suspicious for superimposed right side mucous plugging/atelectasis. 3. Increased left lung base opacity since  10/14/2017 compatible with left lower lobe atelectasis or consolidation. Electronically Signed   By: [name] M.D.   On: 10/18/2017 06:51     Scheduled Meds: . docusate sodium  100 mg Oral BID  . donepezil  10 mg Oral  QHS  . fluticasone  1 spray Each Nare Daily  . furosemide  40 mg Intravenous BID  . ipratropium-albuterol  3 mL Nebulization BID  . losartan  25 mg Oral Daily  . mirabegron ER  50 mg Oral QHS  . potassium chloride  40 mEq Oral Q4H  . vitamin B-12  1,000 mcg Oral BID   Continuous Infusions: . levofloxacin (LEVAQUIN) IV Stopped (10/17/17 2110)     LOS: 5 days    Time spent: 30 min    Janece Canterbury, MD [facility] Pager [phone]  If 7PM-7AM, please contact night-coverage [email or web address] Password [facility] 10/18/2017, 3:27 PM

## 2017-10-18 NOTE — Consult Note (Addendum)
PULMONARY / CRITICAL CARE MEDICINE   Name: Melanie Trevino MRN: [id number] DOB: [birth year]    ADMISSION DATE:  10/13/2017 CONSULTATION DATE:  10/18/2017  REFERRING MD:  Dr. Sheran Fava, [facility]  CHIEF COMPLAINT:  Short of breath  HISTORY OF PRESENT ILLNESS:   83 yo female presented with complaints of cough, gurgling, and wheeze.  She also had progressive dyspnea.  She was also having chills and decreased appetite.  She was found to be hypoxic.  She was started on antibiotics for pneumonia.  She had thoracentesis by IR for Rt pleural effusion 12/12 with 1.8 liters of fluid removed. She continued to have trouble with her breathing and had recurrent of pleural effusion.  Pulmonary was asked to assess.  She has hx of dementia.  She is mostly wheelchair bound and needs assistance from aid for ADLs.  She is able to recognize immediate family, but has trouble with short term memory.  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  PAST SURGICAL HISTORY: She  has a past surgical history that includes Mastectomy (Right).  Allergies  Allergen Reactions  . Shellfish Allergy Anaphylaxis  . Other Other (See Comments)    HORSE SERUMYDrug[Other] swelling6/09/2006 12:00:00 AM by Erin Hearing CPhT  . Amoxicillin Swelling and Rash    Has patient had a PCN reaction causing immediate rash, facial/tongue/throat swelling, SOB or lightheadedness with hypotension: UNKNOWN Has patient had a PCN reaction causing severe rash involving mucus membranes or skin necrosis: Unknown Has patient had a PCN reaction that required hospitalization: Unknown Has patient had a PCN reaction occurring within the  last 10 years: Unknown If all of the above answers are "NO", then may proceed with Cephalosporin use.     No current facility-administered medications on file prior to encounter.    Current Outpatient Medications on File Prior to Encounter  Medication Sig  . albuterol (PROVENTIL HFA;VENTOLIN HFA) 108 (90 Base) MCG/ACT inhaler Inhale 2 puffs into the lungs every 4 (four) hours as needed for wheezing or shortness of breath (cough, shortness of breath or wheezing.).  Marland Kitchen Calcium Carb-Cholecalciferol (CALCIUM 600+D) 600-800 MG-UNIT TABS Take 1 tablet by mouth daily.  Marland Kitchen docusate sodium (COLACE) 100 MG capsule Take 100 mg by mouth 2 (two) times daily. Reported on 04/09/2016  . donepezil (ARICEPT) 10 MG tablet Take 10 mg by mouth at bedtime.  . fluticasone (FLONASE) 50 MCG/ACT nasal spray Place 1 spray into both nostrils daily.   Marland Kitchen GLEEVEC 400 MG tablet Take 400 mg by mouth daily.  . Homeopathic Products (ARNICARE ARTHRITIS PO) Apply 1 application topically 4 (four) times daily as needed (for stiffness/pain.).  Marland Kitchen losartan (COZAAR) 25 MG tablet Take 1 tablet (25 mg total) by mouth daily.  . methocarbamol (ROBAXIN) 500 MG tablet Take 500 mg by mouth every 8 (eight) hours as needed for muscle spasms.   . mirabegron ER (MYRBETRIQ) 50 MG TB24 tablet Take 50 mg by mouth at bedtime.   . vitamin B-12 (CYANOCOBALAMIN) 1000 MCG tablet Take 1,000 mcg by mouth 2 (two) times daily.   Marland Kitchen zinc oxide (BALMEX) 11.3 % CREA cream Apply 1 application topically 4 (four) times daily as needed (for barrier cream to buttocks area).  . ciprofloxacin (CIPRO) 500 MG tablet Take 1 tablet (500  mg total) by mouth daily. (Patient not taking: Reported on 10/13/2017)  . Spacer/Aero-Holding Dorise Bullion Use with MDI as directed    FAMILY HISTORY:  Her indicated that her mother is deceased. She indicated that her father is deceased.   SOCIAL HISTORY: She  reports that  has never smoked. she has never used smokeless tobacco. She  reports that she does not drink alcohol or use drugs.  REVIEW OF SYSTEMS:   12 point ROS negative except above  SUBJECTIVE:  She wants to get shifted in bed.  VITAL SIGNS: BP (!) 94/58 (BP Location: Right Arm)   Pulse (!) 108   Temp (!) 97.5 F (36.4 C) (Oral)   Resp 18   Ht 5\' 4"  (1.626 m)   Wt 202 lb 1.6 oz (91.7 kg)   SpO2 92%   BMI 34.69 kg/m   INTAKE / OUTPUT: I/O last 3 completed shifts: In: 960 [P.O.:960] Out: 4000 [Urine:4000]  PHYSICAL EXAMINATION:  General - alert Eyes - pupils reactive ENT - right facial droop, poor hearing Cardiac - regular, no murmur Chest - decreased BS at bases Abd - soft, non tender Ext - no edema Skin - no rashes Neuro - normal strength, repeats statements  LABS:  BMET Recent Labs  Lab 10/16/17 0607 10/17/17 0634 10/18/17 0556  NA 138 139 139  K 3.3* 3.1* 3.2*  CL 104 101 102  CO2 29 30 30   BUN 19 20 23*  CREATININE 0.82 0.76 0.74  GLUCOSE 98 104* 112*    Electrolytes Recent Labs  Lab 10/16/17 0607 10/17/17 0634 10/18/17 0556  CALCIUM 8.0* 7.9* 7.7*  MG  --  1.7  --     CBC Recent Labs  Lab 10/16/17 0607 10/17/17 0634 10/18/17 0556  WBC 4.6 5.9 5.9  HGB 9.2* 9.4* 10.0*  HCT 27.3* 29.3* 30.2*  PLT 140* 162 160    Coag's Recent Labs  Lab 10/14/17 1552  APTT 26  INR 1.25    Sepsis Markers No results for input(s): LATICACIDVEN, PROCALCITON, O2SATVEN in the last 168 hours.  ABG No results for input(s): PHART, PCO2ART, PO2ART in the last 168 hours.  Liver Enzymes Recent Labs  Lab 10/13/17 1750 10/14/17 1552  AST 48* 43*  ALT 16 15  ALKPHOS 66 62  BILITOT 0.7 0.9  ALBUMIN 3.3* 3.0*    Cardiac Enzymes Recent Labs  Lab 10/14/17 1552 10/14/17 2227 10/15/17 0418  TROPONINI 0.19* 0.19* 0.15*    Glucose Recent Labs  Lab 10/17/17 0736  GLUCAP 107*    Imaging Ct Chest Wo Contrast  Result Date: 10/18/2017 CLINICAL DATA:  Breast cancer.  Pleural effusion. EXAM: CT CHEST WITHOUT  CONTRAST TECHNIQUE: Multidetector CT imaging of the chest was performed following the standard protocol without IV contrast. COMPARISON:  None. FINDINGS: Cardiovascular: Atherosclerotic calcifications of the thoracic aorta. Advanced coronary artery calcifications of all 3 vessels. No evidence of aortic aneurysm. Maximal diameter of the ascending aorta is 3.8 cm. Mediastinum/Nodes: 13 mm short axis diameter precarinal lymph node. No significant pericardial effusion. 2.0 cm left thyroid hypodensity. The esophagus is mildly distended. Lungs/Pleura: Moderate bilateral pleural effusions right greater than left. There is patchy airspace disease within the dependent right lung. Subsegmental atelectasis in the left lung. Mild patchy opacities in the anterior left upper lobe. There is a small amount of fluid layering in the right mainstem bronchus. Upper Abdomen: There is extensive arborized gas throughout the liver. There is a large amount of gas in the gallbladder. These findings would  suggest that this is pneumobilia. However, gas does extend portal liver capsule and portal venous gas cannot be entirely excluded. Pneumobilia is favored. Gas bubbles adjacent of the gallbladder are favored represent gas within intestine. Large abdominal wall hernia contains loops of bowel. There is diffuse subcutaneous edema within the fat of the visualized chest and abdomen. Musculoskeletal: In L1 compression deformity is partially imaged. There is a lytic area within the superior endplate which most likely represents a depressed component of the fracture. Lytic lesion cannot be excluded. IMPRESSION: Patchy airspace opacities in both lungs worrisome for pneumonia. Fluid in the airways suggests aspiration. Bilateral pleural effusions right greater than left Nonspecific gas in the liver. Pneumobilia is favored over portal venous gas. Correlate with a history of previous biliary instrumentation. After discussion with the attending physician,  the patient has no abdominal symptoms and therefore again, pneumobilia is favored. L1 compression fracture as described. Pathologic fracture is not excluded. 2.0 cm left thyroid hypodensity.  Thyroid ultrasound is recommended. Aortic Atherosclerosis (ICD10-I70.0). Electronically Signed   By: Marybelle Killings M.D.   On: 10/18/2017 12:09   Dg Chest Port 1 View  Result Date: 10/18/2017 CLINICAL DATA:  83 year old female with shortness of breath an generalized weakness. Metastatic GIST. EXAM: PORTABLE CHEST 1 VIEW COMPARISON:  10/16/2017 and earlier. FINDINGS: Portable AP semi upright view at 0438 hours. Increasing fluid or thickening in the right minor fissure and mild progression of veiling opacity in the right lung since 10/16/2017. Increased confluent opacity at the left lung base since 10/14/2017. Left hemidiaphragm now mostly obscured. Stable cardiomegaly and mediastinal contours. Calcified aortic atherosclerosis. No pneumothorax. No acute pulmonary edema. Asymmetric obscuration of the right mainstem bronchus. Paucity bowel gas in the upper abdomen. IMPRESSION: 1. Increasing right pleural effusion with fluid now in the right minor fissure. 2. Asymmetric obscuration of the right mainstem bronchus suspicious for superimposed right side mucous plugging/atelectasis. 3. Increased left lung base opacity since 10/14/2017 compatible with left lower lobe atelectasis or consolidation. Electronically Signed   By: [name] M.D.   On: 10/18/2017 06:51     STUDIES:  Echo 12/12 >> mod LVH, EF 60 to 65%, grade 1 DD, PAS 36 mmHg Rt thoracentesis 12/12 >> 1.8 liters, glucose 104, protein < 3, LDH 190, WBC 680 (79%M), reactive mesothelial cells CT chest 12/16 >> atherosclerosis, mod b/l effusions Rt > Lt, patchy ASD in Rt lung  CULTURES: Blood 12/11 >> Rt pleural fluid 12/12 >> negative  ANTIBIOTICS: Zithromax 12/11 >> 12/11 Rocephin 12/11 >> 12/11 Levaquin 12/12 >> 12/16 Zosyn 12/16 >>   SIGNIFICANT  EVENTS: 12/11 Admit 12/16 consult speech and palliative care  DISCUSSION: 83 yo female likely with aspiration pneumonia and pleural effusion.  She has baseline dementia, wheelchair bound, and needs assistance with ADLs.  ASSESSMENT / PLAN:  Aspiration pneumonia. - change Abx to zosyn  Dysphagia. - f/u with speech therapy  Exudate pleural effusion based on LDH. - f/u CXR and monitor after changing ABx - defer additional fluid drainage for now  Alzheimer's dementia. - agree with plan for palliative care consultation  DVT prophylaxis - SCDs SUP - not indicated Nutrition - regular diet Goals of care - full code  Updated pt's family at bedside.  Chesley Mires, MD [facility] Pulmonary/Critical Care 10/18/2017, 6:04 PM Pager:  [phone] After 3pm call: [phone]

## 2017-10-19 ENCOUNTER — Inpatient Hospital Stay (HOSPITAL_COMMUNITY): Payer: Medicare Other

## 2017-10-19 DIAGNOSIS — J90 Pleural effusion, not elsewhere classified: Secondary | ICD-10-CM

## 2017-10-19 LAB — CBC
HCT: 32.2 % — ABNORMAL LOW (ref 36.0–46.0)
HEMOGLOBIN: 10.4 g/dL — AB (ref 12.0–15.0)
MCH: 31.2 pg (ref 26.0–34.0)
MCHC: 32.3 g/dL (ref 30.0–36.0)
MCV: 96.7 fL (ref 78.0–100.0)
Platelets: 153 10*3/uL (ref 150–400)
RBC: 3.33 MIL/uL — ABNORMAL LOW (ref 3.87–5.11)
RDW: 16.2 % — ABNORMAL HIGH (ref 11.5–15.5)
WBC: 6.2 10*3/uL (ref 4.0–10.5)

## 2017-10-19 LAB — BASIC METABOLIC PANEL
ANION GAP: 6 (ref 5–15)
BUN: 29 mg/dL — AB (ref 6–20)
CALCIUM: 7.9 mg/dL — AB (ref 8.9–10.3)
CO2: 29 mmol/L (ref 22–32)
CREATININE: 0.9 mg/dL (ref 0.44–1.00)
Chloride: 106 mmol/L (ref 101–111)
GFR calc Af Amer: >60 mL/min (ref >60)
GFR calc non Af Amer: 56 mL/min — ABNORMAL LOW (ref >60)
GLUCOSE: 116 mg/dL — AB (ref 65–99)
Potassium: 4.1 mmol/L (ref 3.5–5.1)
Sodium: 141 mmol/L (ref 135–145)

## 2017-10-19 LAB — CULTURE, BLOOD (ROUTINE X 2)
CULTURE: NO GROWTH
Culture: NO GROWTH
SPECIAL REQUESTS: ADEQUATE
SPECIAL REQUESTS: ADEQUATE

## 2017-10-19 MED ORDER — METRONIDAZOLE 500 MG PO TABS
500.0000 mg | ORAL_TABLET | Freq: Three times a day (TID) | ORAL | Status: DC
  Administered 2017-10-19 – 2017-10-22 (×10): 500 mg via ORAL
  Filled 2017-10-19 (×10): qty 1

## 2017-10-19 MED ORDER — ENOXAPARIN SODIUM 40 MG/0.4ML ~~LOC~~ SOLN
40.0000 mg | SUBCUTANEOUS | Status: DC
  Administered 2017-10-19 – 2017-11-02 (×15): 40 mg via SUBCUTANEOUS
  Filled 2017-10-19 (×15): qty 0.4

## 2017-10-19 NOTE — Progress Notes (Signed)
LCSW following for disposition.  No family at bedside when LCSw visited patients room. Note asked not to speak with patient before speaking with family.  LCSW left voice message for Daughter in law, Mickel Baas per note and Therapist, sports.   LCSW will continue to follow.  Carolin Coy [place]

## 2017-10-19 NOTE — Progress Notes (Signed)
Date: October 19, 2017 Marieta Markov, BSN, RN3, CCM [phone] Chart and notes review for patient progress and needs. Will follow for case management and discharge needs. Next review date: [id number] 

## 2017-10-19 NOTE — Consult Note (Signed)
Consultation Note Date: 10/19/2017   Patient Name: Melanie Trevino  DOB: 1928/07/05  MRN: [id number]  Age / Sex: 83 y.o., female  PCP: Wenda Low, MD Referring Physician: Janece Canterbury, MD  Reason for Consultation: Establishing goals of care  HPI/Patient Profile: 83 y.o. female   admitted on 10/13/2017     Clinical Assessment and Goals of Care:  83 year old lady who lives at home with assistance from caregivers, has a history of dementia, has been admitted to hospital medicine service for the past few days for pneumonia, underwent thoracenteses for right-sided pleural effusion, pulmonary specialist are also following, palliative care consultation for goals of care discussions, speech-language pathology evaluation also ongoing, patient to undergo modified barium swallow studies, palliative consult for CODE STATUS discussions, goals of care discussions.  Patient is elderly lady sitting up in bed. She is able to answer very few yes/no type questions appropriately. She has short-term memory deficits. She does not appear to be in any acute pain or distress, does not appear to be short of breath. Caregiver present at the bedside.  Call placed and discussed with spokesperson for the family daughter-in-law Mickel Baas who is an Therapist, sports. See discussions/recommendations below. We will continue to follow along. Thank you for the consult.  HCPOA  2 sons. Daughter in law Mickel Baas is spokesperson for the family.   DISCUSSIONS/SUMMARY OF RECOMMENDATIONS:   As per my discussions with the patient's daughter in law Mickel Baas over the phone:  Reviewed the patient's underlying conditions, dementia, progressive dyspnea, PNA, was at home with 24/7 assistance from caregivers.   The patient has made a living will several years ago electing full code, full scope, to have artificial nutrition and hydration according to daughter in law Mickel Baas. I  have reviewed DNR DNI with her in detail.     Mickel Baas says that the patient's family is also considering short term SNF rehab on discharge, in particular, they are considering [facility].   I have offered Mickel Baas our office number [phone], she will discuss with the patient's sons and call us back with date/time of family meeting for additional discussions.   Code Status/Advance Care Planning:  Full code    Symptom Management:     Continue current mode of care.  Palliative Prophylaxis:   Bowel Regimen  Additional Recommendations (Limitations, Scope, Preferences):  Full Scope Treatment  Psycho-social/Spiritual:   Desire for further Chaplaincy support:no  Additional Recommendations: Caregiving  Support/Resources  Prognosis:   Unable to determine  Discharge Planning: To Be Determined      Primary Diagnoses: Present on Admission: . Breast cancer ([place]) . Gastrointestinal stromal tumor (GIST) ([place]) . Dementia of the Alzheimer's type   I have reviewed the medical record, interviewed the patient and family, and examined the patient. The following aspects are pertinent.  Past Medical History:  Diagnosis Date  . Breast cancer ([place]) 08/18/2012  . Cancer ([place])   . Chronic indwelling Foley catheter 07/08/2015  . Dementia of the Alzheimer's type 07/08/2015  . Gastrointestinal stromal tumor (GIST) (  [place]) 07/08/2015  . High cholesterol   . Hypertension   . Liver metastasis ([place]) 07/08/2015  . Renal mass 07/08/2015  . Wheelchair bound 07/08/2015   Social History   Socioeconomic History  . Marital status: Widowed    Spouse name: None  . Number of children: None  . Years of education: None  . Highest education level: None  Social Needs  . Financial resource strain: None  . Food insecurity - worry: None  . Food insecurity - inability: None  . Transportation needs - medical: None  . Transportation needs - non-medical: None  Occupational History  . None  Tobacco Use  .  Smoking status: Never Smoker  . Smokeless tobacco: Never Used  Substance and Sexual Activity  . Alcohol use: No  . Drug use: No  . Sexual activity: None  Other Topics Concern  . None  Social History Narrative  . None   Family History  Problem Relation Age of Onset  . Hypertension Father    Scheduled Meds: . docusate sodium  100 mg Oral BID  . donepezil  10 mg Oral QHS  . fluticasone  1 spray Each Nare Daily  . ipratropium-albuterol  3 mL Nebulization BID  . losartan  25 mg Oral Daily  . metroNIDAZOLE  500 mg Oral Q8H  . mirabegron ER  50 mg Oral QHS  . vitamin B-12  1,000 mcg Oral BID   Continuous Infusions: . cefTRIAXone (ROCEPHIN)  IV Stopped (10/18/17 2027)   PRN Meds:.acetaminophen **OR** acetaminophen, alum & mag hydroxide-simeth, guaiFENesin-dextromethorphan, ipratropium-albuterol, methocarbamol, senna-docusate, traMADol Medications Prior to Admission:  Prior to Admission medications   Medication Sig Start Date End Date Taking? Authorizing Provider  albuterol (PROVENTIL HFA;VENTOLIN HFA) 108 (90 Base) MCG/ACT inhaler Inhale 2 puffs into the lungs every 4 (four) hours as needed for wheezing or shortness of breath (cough, shortness of breath or wheezing.). 10/10/17  Yes Bjorn Pippin, PA-C  Calcium Carb-Cholecalciferol (CALCIUM 600+D) 600-800 MG-UNIT TABS Take 1 tablet by mouth daily.   Yes [provider]  docusate sodium (COLACE) 100 MG capsule Take 100 mg by mouth 2 (two) times daily. Reported on 04/09/2016   Yes [provider]  donepezil (ARICEPT) 10 MG tablet Take 10 mg by mouth at bedtime.   Yes [provider]  fluticasone (FLONASE) 50 MCG/ACT nasal spray Place 1 spray into both nostrils daily.  07/27/12  Yes [provider]  GLEEVEC 400 MG tablet Take 400 mg by mouth daily. 09/19/17  Yes [provider]  Homeopathic Products (ARNICARE ARTHRITIS PO) Apply 1 application topically 4 (four) times daily as needed (for  stiffness/pain.).   Yes [provider]  losartan (COZAAR) 25 MG tablet Take 1 tablet (25 mg total) by mouth daily. 07/31/16  Yes Robbie Lis, MD  methocarbamol (ROBAXIN) 500 MG tablet Take 500 mg by mouth every 8 (eight) hours as needed for muscle spasms.    Yes [provider]  mirabegron ER (MYRBETRIQ) 50 MG TB24 tablet Take 50 mg by mouth at bedtime.    Yes [provider]  vitamin B-12 (CYANOCOBALAMIN) 1000 MCG tablet Take 1,000 mcg by mouth 2 (two) times daily.    Yes [provider]  zinc oxide (BALMEX) 11.3 % CREA cream Apply 1 application topically 4 (four) times daily as needed (for barrier cream to buttocks area).   Yes [provider]  ciprofloxacin (CIPRO) 500 MG tablet Take 1 tablet (500 mg total) by mouth daily. Patient not taking:  Reported on 10/13/2017 08/01/16   Robbie Lis, MD  furosemide (LASIX) 40 MG tablet Take 1 tablet (40 mg total) by mouth daily. 10/16/17   Janece Canterbury, MD  ipratropium-albuterol (DUONEB) 0.5-2.5 (3) MG/3ML SOLN Take 3 mLs by nebulization every 4 (four) hours as needed. 10/16/17   Janece Canterbury, MD  levofloxacin (LEVAQUIN) 750 MG tablet Take 1 tablet (750 mg total) by mouth daily for 10 days. 10/16/17 10/26/17  Janece Canterbury, MD  potassium chloride SA (K-DUR,KLOR-CON) 20 MEQ tablet Take 1 tablet (20 mEq total) by mouth 2 (two) times daily. 10/16/17   Janece Canterbury, MD  Spacer/Aero-Holding Chambers DEVI Use with MDI as directed 01/17/14   Roselee Culver, MD   Allergies  Allergen Reactions  . Shellfish Allergy Anaphylaxis  . Other Other (See Comments)    HORSE SERUMYDrug[Other] swelling6/09/2006 12:00:00 AM by Erin Hearing CPhT  . Amoxicillin Swelling and Rash    Has patient had a PCN reaction causing immediate rash, facial/tongue/throat swelling, SOB or lightheadedness with hypotension: UNKNOWN Has patient had a PCN reaction causing severe rash involving mucus membranes or skin necrosis:  Unknown Has patient had a PCN reaction that required hospitalization: Unknown Has patient had a PCN reaction occurring within the last 10 years: Unknown If all of the above answers are "NO", then may proceed with Cephalosporin use.    Review of Systems Denies pain +cough/secretions  Physical Exam Elderly lady sitting up in bed Working on her puzzles book Is able to answer very few yes/no type questions appropriately Abdomen soft No edema Skin is warm and dry Poor hearing/understanding  Vital Signs: BP 100/68 (BP Location: Right Arm)   Pulse (!) 114   Temp 98.4 F (36.9 C) (Oral)   Resp 18   Ht 5\' 4"  (1.626 m)   Wt 90.5 kg (199 lb 8 oz)   SpO2 93%   BMI 34.24 kg/m  Pain Assessment: No/denies pain   Pain Score: 0-No pain   SpO2: SpO2: 93 % O2 Device:SpO2: 93 % O2 Flow Rate: .O2 Flow Rate (L/min): 2 L/min  IO: Intake/output summary:   Intake/Output Summary (Last 24 hours) at 10/19/2017 1346 Last data filed at 10/19/2017 1056 Gross per 24 hour  Intake 490 ml  Output 1200 ml  Net -710 ml    LBM: Last BM Date: 10/19/17 Baseline Weight: Weight: 96.6 kg (213 lb) Most recent weight: Weight: 90.5 kg (199 lb 8 oz)     Palliative Assessment/Data:   Flowsheet Rows     Most Recent Value  Intake Tab  Referral Department  Hospitalist  Unit at Time of Referral  Med/Surg Unit  Palliative Care Primary Diagnosis  Neurology  Palliative Care Type  New Palliative care  Reason for referral  Clarify Goals of Care  Date first seen by Palliative Care  10/19/17  Clinical Assessment  Palliative Performance Scale Score  40%  Pain Max last 24 hours  4  Pain Min Last 24 hours  3  Dyspnea Max Last 24 Hours  3  Dyspnea Min Last 24 hours  2  Psychosocial & Spiritual Assessment  Palliative Care Outcomes  Patient/Family meeting held?  Yes  Who was at the meeting?  daughter in law Mickel Baas over the phone.      PPS 40% Time In:  12 Time Out:  1300 Time Total:  60 min  Greater  than 50%  of this time was spent counseling and coordinating care related to the above assessment and plan.  Signed by: Smitty Pluck  Rowe Pavy, MD [phone]   Please contact Palliative Medicine Team phone at [phone] for questions and concerns.  For individual provider: See Shea Evans

## 2017-10-19 NOTE — Evaluation (Signed)
Physical Therapy Evaluation Patient Details Name: Melanie Trevino MRN: [id number] DOB: 07-06-1928 Today's Date: 10/19/2017   History of Present Illness  83 yo female with onset of pleural effusion and thoracentesis on 12/16 was admitted and note hypoxia, secretions and PNA.  PMHx:  wc bound, foley cath, dementia, HTN, liver mets, breast CA  Clinical Impression  Pt is up to side of bed with PT and unable to initiate mobility, but is notably willing to work with verbal and tactile cues.  Her tolerance is limited by pain of LE contractures, and should be given ROM by her nursing caregivers at home routinely.  Pt could be a LTC resident at her current level but family is committed to keeping her home.  Would not recommend aggressive therapy at her condition and will dc PT here for now due to pt being at a baseline of function.  Was not able to transfer without sliding board before and now is more dependent, appropriate for mechanical lift.      Follow Up Recommendations Supervision/Assistance - 24 hour;Supervision for mobility/OOB;Other (comment)(nursing to mobilize with ROM to legs as routine care)    Equipment Recommendations  Other (comment)(may need to be a hoyer lift due to pt abiltiy to participate)    Recommendations for Other Services       Precautions / Restrictions Precautions Precautions: Fall Restrictions Weight Bearing Restrictions: No      Mobility  Bed Mobility Overal bed mobility: Needs Assistance Bed Mobility: Supine to Sit;Sit to Supine     Supine to sit: Total assist Sit to supine: Total assist   General bed mobility comments: pt is moved wiht help as she cannot initiate to change positions  Transfers Overall transfer level: Needs assistance               General transfer comment: pt is total lift as she cannot support herself on her legs  Ambulation/Gait             General Gait Details: not ambulatory  Stairs            Wheelchair  Mobility    Modified Rankin (Stroke Patients Only)       Balance Overall balance assessment: Needs assistance Sitting-balance support: Feet supported;Bilateral upper extremity supported Sitting balance-Leahy Scale: Poor       Standing balance-Leahy Scale: Zero                               Pertinent Vitals/Pain Pain Assessment: Faces Faces Pain Scale: Hurts even more Pain Location: back, during repositioning Pain Descriptors / Indicators: Aching;Moaning Pain Intervention(s): Monitored during session;Repositioned    Home Living Family/patient expects to be discharged to:: Private residence Living Arrangements: Alone Available Help at Discharge: Available 24 hours/day;Personal care attendant Type of Home: House Home Access: Ramped entrance     Home Layout: One level Home Equipment: Wheelchair - power;Bedside commode;Other (comment)(sliding board) Additional Comments: per her attendant she is getting a power lift    Prior Function Level of Independence: Needs assistance   Gait / Transfers Assistance Needed: transfers with sliding board or standing pivot with assist  ADL's / Homemaking Assistance Needed: care attendants to handle all her bathing and dressing         Hand Dominance        Extremity/Trunk Assessment   Upper Extremity Assessment Upper Extremity Assessment: Generalized weakness    Lower Extremity Assessment Lower Extremity Assessment: Generalized weakness  Cervical / Trunk Assessment Cervical / Trunk Assessment: Kyphotic  Communication   Communication: HOH  Cognition Arousal/Alertness: Lethargic Behavior During Therapy: Flat affect Overall Cognitive Status: History of cognitive impairments - at baseline                                 General Comments: pt is confused but reasonably cooperative      General Comments General comments (skin integrity, edema, etc.): pt has significant skin changes from her blood  thinner, and is uncomfortable from chronic contractures of hips and knees    Exercises General Exercises - Lower Extremity Ankle Circles/Pumps: AAROM;Both;5 reps Long Arc Quad: AAROM;Both;5 reps Heel Slides: AAROM;Both;5 reps Hip ABduction/ADduction: AAROM;Both;5 reps   Assessment/Plan    PT Assessment Patent does not need any further PT services  PT Problem List Decreased strength;Decreased range of motion;Decreased activity tolerance;Decreased balance;Decreased mobility;Decreased coordination;Decreased cognition;Decreased safety awareness;Decreased knowledge of precautions;Cardiopulmonary status limiting activity;Obesity;Decreased skin integrity;Pain       PT Treatment Interventions      PT Goals (Current goals can be found in the Care Plan section)  Acute Rehab PT Goals Patient Stated Goal: none stated PT Goal Formulation: Patient unable to participate in goal setting    Frequency     Barriers to discharge   Needs two person assist to move    Co-evaluation               AM-PAC PT "6 Clicks" Daily Activity  Outcome Measure Difficulty turning over in bed (including adjusting bedclothes, sheets and blankets)?: Unable Difficulty moving from lying on back to sitting on the side of the bed? : Unable Difficulty sitting down on and standing up from a chair with arms (e.g., wheelchair, bedside commode, etc,.)?: Unable Help needed moving to and from a bed to chair (including a wheelchair)?: Total Help needed walking in hospital room?: Total Help needed climbing 3-5 steps with a railing? : Total 6 Click Score: 6    End of Session   Activity Tolerance: Patient limited by fatigue;Patient limited by pain;Other (comment)(dementia limits her tolerance) Patient left: in bed;with call bell/phone within reach;with bed alarm set;with family/visitor present Nurse Communication: Mobility status PT Visit Diagnosis: Muscle weakness (generalized) (M62.81);Other abnormalities of gait and  mobility (R26.89);Pain Pain - Right/Left: (Bilateral) Pain - part of body: (hips and knees)    Time: 0981-1914 PT Time Calculation (min) (ACUTE ONLY): 26 min   Charges:   PT Evaluation $PT Eval Moderate Complexity: 1 Mod PT Treatments $Therapeutic Activity: 8-22 mins   PT G Codes:   PT G-Codes **NOT FOR INPATIENT CLASS** Functional Assessment Tool Used: AM-PAC 6 Clicks Basic Mobility    Ramond Dial 10/19/2017, 1:07 PM   Mee Hives, PT MS Acute Rehab Dept. Number: [place] and [place]

## 2017-10-19 NOTE — Progress Notes (Signed)
PULMONARY / CRITICAL CARE MEDICINE   Name: Melanie Trevino MRN: [id number] DOB: 10-Jun-1928    ADMISSION DATE:  10/13/2017 CONSULTATION DATE:  10/18/2017  REFERRING MD:  Dr. Sheran Fava, [facility]  CHIEF COMPLAINT:  Short of breath  HISTORY OF PRESENT ILLNESS:   83 yo female presented with complaints of cough, gurgling, and wheeze.  She also had progressive dyspnea.  She was also having chills and decreased appetite.  She was found to be hypoxic.  She was started on antibiotics for pneumonia.  She had thoracentesis by IR for Rt pleural effusion 12/12 with 1.8 liters of fluid removed. She continued to have trouble with her breathing and had recurrent of pleural effusion.  Pulmonary was asked to assess.  She has hx of dementia.  She is mostly wheelchair bound and needs assistance from aid for ADLs.  She is able to recognize immediate family, but has trouble with short term memory.  SUBJECTIVE:  Pt's caregiver at bedside reports she has been coughing up a significant amount of secretions.  Pt denies fever, SOB, chills.  SLP evaluation noted.   VITAL SIGNS: BP 100/68 (BP Location: Right Arm)   Pulse (!) 114   Temp 98.4 F (36.9 C) (Oral)   Resp 18   Ht 5\' 4"  (1.626 m)   Wt 199 lb 8 oz (90.5 kg)   SpO2 93%   BMI 34.24 kg/m   INTAKE / OUTPUT: I/O last 3 completed shifts: In: 1090 [P.O.:840; IV Piggyback:250] Out: 2450 [Urine:2450]  PHYSICAL EXAMINATION: General: elderly female in NAD, sitting up in bed  HEENT: MM pink/moist, no jvd Neuro: Awake, alert /oriented to self, repeats questions, R facial droop (baseline), poor hearing, MAE  CV: s1s2 rrr, no m/r/g PULM: even/non-labored, lungs bilaterally diminished at bases R>L  WU:JWJX, non-tender, bsx4 active  Extremities: warm/dry, 1+ BLE edema with dark skin changes  Skin: no rashes or lesions  LABS:  BMET Recent Labs  Lab 10/17/17 0634 10/18/17 0556 10/19/17 0531  NA 139 139 141  K 3.1* 3.2* 4.1  CL 101 102 106  CO2 30 30 29   BUN  20 23* 29*  CREATININE 0.76 0.74 0.90  GLUCOSE 104* 112* 116*    Electrolytes Recent Labs  Lab 10/17/17 0634 10/18/17 0556 10/19/17 0531  CALCIUM 7.9* 7.7* 7.9*  MG 1.7  --   --     CBC Recent Labs  Lab 10/17/17 0634 10/18/17 0556 10/19/17 0531  WBC 5.9 5.9 6.2  HGB 9.4* 10.0* 10.4*  HCT 29.3* 30.2* 32.2*  PLT 162 160 153    Coag's Recent Labs  Lab 10/14/17 1552  APTT 26  INR 1.25    Sepsis Markers No results for input(s): LATICACIDVEN, PROCALCITON, O2SATVEN in the last 168 hours.  ABG No results for input(s): PHART, PCO2ART, PO2ART in the last 168 hours.  Liver Enzymes Recent Labs  Lab 10/13/17 1750 10/14/17 1552  AST 48* 43*  ALT 16 15  ALKPHOS 66 62  BILITOT 0.7 0.9  ALBUMIN 3.3* 3.0*    Cardiac Enzymes Recent Labs  Lab 10/14/17 1552 10/14/17 2227 10/15/17 0418  TROPONINI 0.19* 0.19* 0.15*    Glucose Recent Labs  Lab 10/17/17 0736  GLUCAP 107*    Imaging No results found.   STUDIES:  Echo 12/12 >> mod LVH, EF 60 to 65%, grade 1 DD, PAS 36 mmHg Rt thoracentesis 12/12 >> 1.8 liters, glucose 104, protein < 3, LDH 190, WBC 680 (79%M), reactive mesothelial cells CT chest 12/16 >> atherosclerosis, mod b/l  effusions Rt > Lt, patchy ASD in Rt lung SLP Eval 12/16 >> rec's for regular diet / thin liquids on clinical exam, consider MBS  CULTURES: Blood 12/11 >> Rt pleural fluid 12/12 >> negative  ANTIBIOTICS: Zithromax 12/11 >> 12/11 Rocephin 12/11 >> 12/11 Levaquin 12/12 >> 12/16 Zosyn 12/16 >>   SIGNIFICANT EVENTS: 12/11 Admit 12/16 consult speech and palliative care  DISCUSSION: 83 yo female likely with aspiration pneumonia and pleural effusion.  She has baseline dementia, wheelchair bound, and needs assistance with ADLs.  ASSESSMENT / PLAN:  Aspiration pneumonia P: Continue Zosyn, D6/x abx  Aspiration precautions Recommend proceeding with MBS  Follow CXR, await 2 view 12/17  Dysphagia. P: Await SLP findings on  MBS  Exudative pleural effusion based on LDH P: Follow CXR, monitor with changes to ABX Monitor for increase in effusion size  Alzheimer's dementia P: Agree with palliative care evaluation   DVT prophylaxis - SCDs SUP - not indicated Nutrition - regular diet Goals of care - full code   Melanie Gens, NP-C [place] Pgr: [phone] or if no answer [phone] 10/19/2017, 11:51 AM

## 2017-10-19 NOTE — Consult Note (Addendum)
Modified Barium Swallow Progress Note  Patient Details  Name: Melanie Trevino MRN: [id number] Date of Birth: 07/29/1928  Today's Date: 10/19/2017  Modified Barium Swallow completed.  Full report located under Chart Review in the Imaging Section.  Brief recommendations include the following:  Clinical Impression Pt presents with normal oropharyngeal swallow.  No oral issues. Timely swallow reflex, no aspiration, and no post-swallow residue noted. Trace intermittent flash penetration of thin, which is considered within normal limits for pt age.   Recommend continuing with regular diet and thin liquids. Safe swallow precautions sent to room with pt/transport.    Swallow Evaluation Recommendations  SLP Diet Recommendations: Regular solids;Thin liquid   Liquid Administration via: Cup;Straw   Medication Administration: Whole meds with liquid   Supervision: Patient able to self feed   Compensations: Minimize environmental distractions;Slow rate;Small sips/bites   Postural Changes: Seated upright at 90 degrees   Oral Care Recommendations: Oral care BID   Melanie Trevino, [facility], [place] Speech Language Pathologist [phone]  Melanie Trevino 10/19/2017,2:47 PM

## 2017-10-19 NOTE — Progress Notes (Signed)
ANTICOAGULATION CONSULT NOTE - Initial Consult  Pharmacy Consult for Enoxaparin Indication: VTE prophylaxis  Allergies  Allergen Reactions  . Shellfish Allergy Anaphylaxis  . Other Other (See Comments)    HORSE SERUMYDrug[Other] swelling6/09/2006 12:00:00 AM by Erin Hearing CPhT  . Amoxicillin Swelling and Rash    Has patient had a PCN reaction causing immediate rash, facial/tongue/throat swelling, SOB or lightheadedness with hypotension: UNKNOWN Has patient had a PCN reaction causing severe rash involving mucus membranes or skin necrosis: Unknown Has patient had a PCN reaction that required hospitalization: Unknown Has patient had a PCN reaction occurring within the last 10 years: Unknown If all of the above answers are "NO", then may proceed with Cephalosporin use.     Patient Measurements: Height: 5\' 4"  (162.6 cm) Weight: 199 lb 8 oz (90.5 kg) IBW/kg (Calculated) : 54.7  Vital Signs: Temp: 98.2 F (36.8 C) (12/17 1459) Temp Source: Oral (12/17 1459) BP: 102/69 (12/17 1459) Pulse Rate: 125 (12/17 1459)  Labs: Recent Labs    10/17/17 0634 10/18/17 0556 10/19/17 0531  HGB 9.4* 10.0* 10.4*  HCT 29.3* 30.2* 32.2*  PLT 162 160 153  CREATININE 0.76 0.74 0.90    Estimated Creatinine Clearance: 48 mL/min (by C-G formula based on SCr of 0.9 mg/dL).   Medical History: Past Medical History:  Diagnosis Date  . Breast cancer ([place]) 08/18/2012  . Cancer ([place])   . Chronic indwelling Foley catheter 07/08/2015  . Dementia of the Alzheimer's type 07/08/2015  . 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 High cholesterol   . Hypertension   . Liver metastasis ([place]) 07/08/2015  . Renal mass 07/08/2015  . Wheelchair bound 07/08/2015    Assessment: 6 y/oF with PMH of GIST with liver metastases, breast cancer s/p mastectomy, dementia, atrial fibrillation not on anticoagulation PTA due to history of thrombocytopenia and deconditioning/falls risk currently admitted for  acute hypoxic respiratory distress due to large right-sided pleural effusion, acute on chronic diastolic heart failure. Pharmacy consulted to dose enoxaparin for VTE prophylaxis.    SCr trending up, 0.74 > 0.9 (holding lasix). CrCl ~ 48 ml/min  CBC: Hgb 7.2 on admission, transfused 2 units PRBCs 12/11, improved to 10.4 today-anemia of chronic disease per MD. Pltc WNL at 153K   Goal of Therapy:  prevention of VTE Monitor platelets by anticoagulation protocol: Yes   Plan:   Enoxaparin 40mg  SQ q24h.  Monitor CBC, renal function, and for s/sx of bleeding. Pharmacy will sign off at this time. Please reconsult if a change in clinical status warrants re-evaluation of dosage.   Lindell Spar, PharmD, BCPS Pager: [phone] 10/19/2017 4:42 PM

## 2017-10-19 NOTE — Progress Notes (Signed)
PHARMACIST - PHYSICIAN COMMUNICATION DR:  Sheran Fava CONCERNING: Antibiotic IV to Oral Route Change Policy  RECOMMENDATION: This patient is receiving flagyl by the intravenous route.  Based on criteria approved by the Pharmacy and Therapeutics Committee, the antibiotic(s) is/are being converted to the equivalent oral dose form(s).   DESCRIPTION: These criteria include:  Patient being treated for a respiratory tract infection, urinary tract infection, cellulitis or clostridium difficile associated diarrhea if on metronidazole  The patient is not neutropenic and does not exhibit a GI malabsorption state  The patient is eating (either orally or via tube) and/or has been taking other orally administered medications for a least 24 hours  The patient is improving clinically and has a Tmax < 100.5  If you have questions about this conversion, please contact the Pharmacy Department  []   [phone] )  [facility] []   [phone] )  [facility] []   [phone] )  [facility] []   [phone] )  [facility] [x]   [phone] )  [place], [place].D. [phone] 10/19/2017 10:35 AM

## 2017-10-19 NOTE — Progress Notes (Signed)
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   Chest x-ray demonstrated a large right-sided pleural effusion.  She was anemic with a hemoglobin of 7.2.  Occult stool was negative.  She was transfused 2 units of blood and underwent a thoracentesis on 12/12 with removal of 1.8 L of dark fluid.  Cytology was negative for malignancy.  Fluid was exudative by LDH criteria.  She was started on levofloxacin and diuresed but despite diuresis, she had reaccumulation of her effusion with development of some interstitial edema bilaterally consistent with heart failure.  Her diuresis was increased but her effusions continued to worsen.  Modified barium swallow demonstrated no evidence of aspiration but CT chest demonstrated patchy airspace opacities in both lungs worrisome for pneumonia, with fluid in the airways suggesting aspiration.  Her antibiotics were changed to cover aspiration pneumonia on 12/16.  Pulmonology was consulted and are discussing treatment options with the family.  Palliative care has started talking with the family.  The patient has a living will in which she stated she would want to be full code with artificial nutrition and aggressive measures.   Assessment & Plan:  Acute hypoxic respiratory distress due to large right-sided exudative pleural effusion by LDH criteria, possibly due to aspiration pneumonia -  Continue ceftriaxone + flagyl, day 2 -  Blood cultures NGTD -  pleural fluid culture no growth  -  Cytology: Reactive mesothelial cells, no malignant cells identified -  Palliative care and pulmonology assistance appreciated - Pulmonology assistance appreciated -  SLP performed MBS which  demonstrated no aspiration  Acute on chronic diastolic heart failure, BUN and creatinine rising - Echocardiogram: Grade 1 diastolic dysfunction with elevated left and right heart filling pressures -  Hold lasix - Neg 1.2L plus 2 voids  Incidental finding on CT:  Air within the biliary tree, likely contained.  Patient does not exhibits signs or symptoms of necrotic bowel or other abdominal catastrophe.     Anemia due to chronic disease, occult stool negative.  Although iron levels and saturation are low, TIBC is low and ferritin is normal, suggestive of chronic disease.  b12 elevated, folate normal.  Hemoglobin continues to improve.   -  Transfused 2 unit PRBC on 12/11  Demand ischemia due to anemia and pleural effusion -Troponin first set is 0.67, subsequent troponin 0 0.19, 0.19, 0.15  - EKG is negative for any acute ST-T changes.  - No regional wall motion abnormalities on ECHO  Paroxysmal atrial fibrillation, not on anticoagulation due to her history of thrombocytopenia and deconditioning/falls risk.  Essential hypertension, now with hypotension, holding diuretics.  Discontinue losartan.  GIST with Liver Mets, follows at [facility] and stable on [place] History of acoustic neuroma resected in 1995 History of breast cancer status post mastectomy, in remission Alzheimer's dementia, continue aricept Hyponatremia, resolved with diuresis Hypokalemia, likely to secondary to diuresis, resolved with oral potassium repletion Left chest wall pain, likely musculoskeletal, now resolved.  DVT prophylaxis:  lovenox Code Status:  Full  Family Communication:  Patient and caretaker at bedside Disposition Plan: Patient does not appear to be improving.  Palliative care and pulmonology consulted  Consultants:   RAdiology  Palliative care  Pulmonology  Speech therapy  Procedures:  Thoracentesis  on 12/12 MBS on 12/17  Antimicrobials:  Anti-infectives (From admission, onward)   Start      Dose/Rate Route Frequency Ordered Stop   10/19/17 1400  metroNIDAZOLE (FLAGYL) tablet 500 mg     500 mg Oral Every 8 hours 10/19/17 1033     10/18/17 1830  cefTRIAXone (ROCEPHIN) 1 g in dextrose 5 % 50 mL IVPB     1 g 100 mL/hr over 30 Minutes Intravenous Every 24 hours 10/18/17 1821     10/18/17 1830  metroNIDAZOLE (FLAGYL) IVPB 500 mg  Status:  Discontinued     500 mg 100 mL/hr over 60 Minutes Intravenous Every 8 hours 10/18/17 1821 10/19/17 1033   10/16/17 0000  levofloxacin (LEVAQUIN) 750 MG tablet     750 mg Oral Daily 10/16/17 1005 10/26/17 2359   10/15/17 2000  levofloxacin (LEVAQUIN) IVPB 750 mg  Status:  Discontinued     750 mg 100 mL/hr over 90 Minutes Intravenous Every 24 hours 10/14/17 2200 10/18/17 1806   10/14/17 2000  levofloxacin (LEVAQUIN) IVPB 750 mg  Status:  Discontinued     750 mg 100 mL/hr over 90 Minutes Intravenous Every 48 hours 10/13/17 2132 10/14/17 2200   10/13/17 1930  cefTRIAXone (ROCEPHIN) 1 g in dextrose 5 % 50 mL IVPB     1 g 100 mL/hr over 30 Minutes Intravenous  Once 10/13/17 1915 10/13/17 2058   10/13/17 1930  azithromycin (ZITHROMAX) tablet 500 mg     500 mg Oral  Once 10/13/17 1915 10/13/17 2016       Subjective:  Patient confused, history limited.  To the caretaker, she has not been complaining of any chest pains or difficulty breathing.  She has still been coughing.  Objective: Vitals:   10/18/17 1353 10/18/17 2033 10/18/17 2049 10/19/17 0532  BP: (!) 94/58 (!) 92/54  100/68  Pulse: (!) 108 (!) 115  (!) 114  Resp: 18 19  18   Temp: (!) 97.5 F (36.4 C) 98.2 F (36.8 C)  98.4 F (36.9 C)  TempSrc: Oral Oral  Oral  SpO2: 92% 91% 90% 93%  Weight:    90.5 kg (199 lb 8 oz)  Height:        Intake/Output Summary (Last 24 hours) at 10/19/2017 1439 Last data filed at 10/19/2017 1056 Gross per 24 hour  Intake 490 ml  Output 900 ml  Net -410 ml   Filed Weights   10/17/17 2046 10/18/17 0542 10/19/17 0532  Weight: 98.1 kg (216 lb 4.8  oz) 91.7 kg (202 lb 1.6 oz) 90.5 kg (199 lb 8 oz)    Examination:  General exam:  Adult female, confused.  No acute distress.  HEENT:  NCAT, MMM Respiratory system: Diminished throughout the right lung fields with coarse rales heard bilaterally, positive rhonchi Cardiovascular system: Regular rate and rhythm, normal S1/S2. No murmurs, rubs, gallops or clicks.  Warm extremities Gastrointestinal system: Normal active bowel sounds, soft, nondistended, nontender.  Abdominal wall hernia is reducible. MSK:  Normal tone and bulk, no lower extremity edema Neuro: Right facial droop  General exam:  Adult female, confused.  No acute distress.  HEENT:  NCAT, MMM Respiratory system:  Diminished throughout the right lung fields with rales heard bilaterally, positive rhonchi Cardiovascular system: Regular rate and rhythm, normal S1/S2. No murmurs, rubs, gallops or clicks.  Warm extremities Gastrointestinal system: Normal active bowel sounds, soft, nondistended, nontender. MSK:  Normal tone and bulk, no lower extremity edema Neuro:  Right facial droop  Data Reviewed: I  have personally reviewed following labs and imaging studies  CBC: Recent Labs  Lab 10/13/17 1750  10/15/17 0418 10/16/17 0607 10/17/17 0634 10/18/17 0556 10/19/17 0531  WBC 3.3*   < > 6.7 4.6 5.9 5.9 6.2  NEUTROABS 3.0  --   --   --   --   --   --   HGB 7.2*   < > 8.7* 9.2* 9.4* 10.0* 10.4*  HCT 21.2*   < > 25.6* 27.3* 29.3* 30.2* 32.2*  MCV 98.1   < > 93.1 95.1 97.0 96.5 96.7  PLT 140*   < > 141* 140* 162 160 153   < > = values in this interval not displayed.   Basic Metabolic Panel: Recent Labs  Lab 10/15/17 0418 10/16/17 0607 10/17/17 0634 10/18/17 0556 10/19/17 0531  NA 133* 138 139 139 141  K 3.8 3.3* 3.1* 3.2* 4.1  CL 100* 104 101 102 106  CO2 27 29 30 30 29   GLUCOSE 98 98 104* 112* 116*  BUN 19 19 20  23* 29*  CREATININE 0.89 0.82 0.76 0.74 0.90  CALCIUM 8.0* 8.0* 7.9* 7.7* 7.9*  MG  --   --  1.7  --   --     GFR: Estimated Creatinine Clearance: 48 mL/min (by C-G formula based on SCr of 0.9 mg/dL). Liver Function Tests: Recent Labs  Lab 10/13/17 1750 10/14/17 1552  AST 48* 43*  ALT 16 15  ALKPHOS 66 62  BILITOT 0.7 0.9  PROT 5.6* 5.3*  ALBUMIN 3.3* 3.0*   No results for input(s): LIPASE, AMYLASE in the last 168 hours. No results for input(s): AMMONIA in the last 168 hours. Coagulation Profile: Recent Labs  Lab 10/14/17 1552  INR 1.25   Cardiac Enzymes: Recent Labs  Lab 10/13/17 1750 10/14/17 1552 10/14/17 2227 10/15/17 0418  TROPONINI 0.67* 0.19* 0.19* 0.15*   BNP (last 3 results) No results for input(s): PROBNP in the last 8760 hours. HbA1C: No results for input(s): HGBA1C in the last 72 hours. CBG: Recent Labs  Lab 10/17/17 0736  GLUCAP 107*   Lipid Profile: No results for input(s): CHOL, HDL, LDLCALC, TRIG, CHOLHDL, LDLDIRECT in the last 72 hours. Thyroid Function Tests: No results for input(s): TSH, T4TOTAL, FREET4, T3FREE, THYROIDAB in the last 72 hours. Anemia Panel: No results for input(s): VITAMINB12, FOLATE, FERRITIN, TIBC, IRON, RETICCTPCT in the last 72 hours. Urine analysis:    Component Value Date/Time   COLORURINE YELLOW 10/13/2017 1918   APPEARANCEUR CLEAR 10/13/2017 1918   LABSPEC 1.013 10/13/2017 1918   PHURINE 5.0 10/13/2017 1918   GLUCOSEU NEGATIVE 10/13/2017 1918   HGBUR SMALL (A) 10/13/2017 1918   BILIRUBINUR NEGATIVE 10/13/2017 1918   BILIRUBINUR negative 04/05/2016 1414   KETONESUR 5 (A) 10/13/2017 1918   PROTEINUR NEGATIVE 10/13/2017 1918   UROBILINOGEN 0.2 04/05/2016 1414   UROBILINOGEN 1.0 07/07/2015 1901   NITRITE NEGATIVE 10/13/2017 1918   LEUKOCYTESUR NEGATIVE 10/13/2017 1918   Sepsis Labs: @LABRCNTIP (procalcitonin:4,lacticidven:4)  ) Recent Results (from the past 240 hour(s))  Culture, blood (routine x 2)     Status: None   Collection Time: 10/13/17  8:03 PM  Result Value Ref Range Status   Specimen Description  BLOOD LEFT ANTECUBITAL  Final   Special Requests   Final    BOTTLES DRAWN AEROBIC AND ANAEROBIC Blood Culture adequate volume   Culture   Final    NO GROWTH 5 DAYS Performed at [facility], [place] [address] [place],  [address]  Report Status 10/19/2017 FINAL  Final  Culture, blood (routine x 2)     Status: None   Collection Time: 10/13/17  9:27 PM  Result Value Ref Range Status   Specimen Description BLOOD LEFT ARM  Final   Special Requests   Final    BOTTLES DRAWN AEROBIC AND ANAEROBIC Blood Culture adequate volume   Culture   Final    NO GROWTH 5 DAYS Performed at [facility], [address] [place], [place] [address]    Report Status 10/19/2017 FINAL  Final  Body fluid culture     Status: None   Collection Time: 10/14/17  2:47 PM  Result Value Ref Range Status   Specimen Description PLEURAL  Final   Special Requests RIGHT  Final   Gram Stain   Final    FEW WBC PRESENT, PREDOMINANTLY MONONUCLEAR NO ORGANISMS SEEN    Culture   Final    No growth aerobically or anaerobically. Performed at [facility], [place] [address], [place], [place] [address]    Report Status 10/18/2017 FINAL  Final      Radiology Studies: Ct Chest Wo Contrast  Result Date: 10/18/2017 CLINICAL DATA:  Breast cancer.  Pleural effusion. EXAM: CT CHEST WITHOUT CONTRAST TECHNIQUE: Multidetector CT imaging of the chest was performed following the standard protocol without IV contrast. COMPARISON:  None. FINDINGS: Cardiovascular: Atherosclerotic calcifications of the thoracic aorta. Advanced coronary artery calcifications of all 3 vessels. No evidence of aortic aneurysm. Maximal diameter of the ascending aorta is 3.8 cm. Mediastinum/Nodes: 13 mm Kahlie Deutscher axis diameter precarinal lymph node. No significant pericardial effusion. 2.0 cm left thyroid hypodensity. The esophagus is mildly distended. Lungs/Pleura: Moderate bilateral pleural effusions right greater than left. There is patchy  airspace disease within the dependent right lung. Subsegmental atelectasis in the left lung. Mild patchy opacities in the anterior left upper lobe. There is a small amount of fluid layering in the right mainstem bronchus. Upper Abdomen: There is extensive arborized gas throughout the liver. There is a large amount of gas in the gallbladder. These findings would suggest that this is pneumobilia. However, gas does extend portal liver capsule and portal venous gas cannot be entirely excluded. Pneumobilia is favored. Gas bubbles adjacent of the gallbladder are favored represent gas within intestine. Large abdominal wall hernia contains loops of bowel. There is diffuse subcutaneous edema within the fat of the visualized chest and abdomen. Musculoskeletal: In L1 compression deformity is partially imaged. There is a lytic area within the superior endplate which most likely represents a depressed component of the fracture. Lytic lesion cannot be excluded. IMPRESSION: Patchy airspace opacities in both lungs worrisome for pneumonia. Fluid in the airways suggests aspiration. Bilateral pleural effusions right greater than left Nonspecific gas in the liver. Pneumobilia is favored over portal venous gas. Correlate with a history of previous biliary instrumentation. After discussion with the attending physician, the patient has no abdominal symptoms and therefore again, pneumobilia is favored. L1 compression fracture as described. Pathologic fracture is not excluded. 2.0 cm left thyroid hypodensity.  Thyroid ultrasound is recommended. Aortic Atherosclerosis (ICD10-I70.0). Electronically Signed   By: Marybelle Killings M.D.   On: 10/18/2017 12:09   Dg Chest Port 1 View  Result Date: 10/18/2017 CLINICAL DATA:  84 year old female with shortness of breath an generalized weakness. Metastatic GIST. EXAM: PORTABLE CHEST 1 VIEW COMPARISON:  10/16/2017 and earlier. FINDINGS: Portable AP semi upright view at 0438 hours. Increasing fluid or  thickening in the right minor fissure  and mild progression of veiling opacity in the right lung since 10/16/2017. Increased confluent opacity at the left lung base since 10/14/2017. Left hemidiaphragm now mostly obscured. Stable cardiomegaly and mediastinal contours. Calcified aortic atherosclerosis. No pneumothorax. No acute pulmonary edema. Asymmetric obscuration of the right mainstem bronchus. Paucity bowel gas in the upper abdomen. IMPRESSION: 1. Increasing right pleural effusion with fluid now in the right minor fissure. 2. Asymmetric obscuration of the right mainstem bronchus suspicious for superimposed right side mucous plugging/atelectasis. 3. Increased left lung base opacity since 10/14/2017 compatible with left lower lobe atelectasis or consolidation. Electronically Signed   By: [name] M.D.   On: 10/18/2017 06:51     Scheduled Meds: . docusate sodium  100 mg Oral BID  . donepezil  10 mg Oral QHS  . fluticasone  1 spray Each Nare Daily  . ipratropium-albuterol  3 mL Nebulization BID  . losartan  25 mg Oral Daily  . metroNIDAZOLE  500 mg Oral Q8H  . mirabegron ER  50 mg Oral QHS  . vitamin B-12  1,000 mcg Oral BID   Continuous Infusions: . cefTRIAXone (ROCEPHIN)  IV Stopped (10/18/17 2027)     LOS: 6 days    Time spent: 30 min    Janece Canterbury, MD [facility] Pager [phone]  If 7PM-7AM, please contact night-coverage [email or web address] Password [facility] 10/19/2017, 2:39 PM

## 2017-10-20 DIAGNOSIS — L8990 Pressure ulcer of unspecified site, unspecified stage: Secondary | ICD-10-CM

## 2017-10-20 LAB — BASIC METABOLIC PANEL
Anion gap: 6 (ref 5–15)
BUN: 31 mg/dL — AB (ref 6–20)
CALCIUM: 8 mg/dL — AB (ref 8.9–10.3)
CHLORIDE: 107 mmol/L (ref 101–111)
CO2: 28 mmol/L (ref 22–32)
CREATININE: 0.74 mg/dL (ref 0.44–1.00)
Glucose, Bld: 102 mg/dL — ABNORMAL HIGH (ref 65–99)
Potassium: 3.7 mmol/L (ref 3.5–5.1)
SODIUM: 141 mmol/L (ref 135–145)

## 2017-10-20 LAB — CBC
HCT: 32.6 % — ABNORMAL LOW (ref 36.0–46.0)
Hemoglobin: 10.4 g/dL — ABNORMAL LOW (ref 12.0–15.0)
MCH: 30.8 pg (ref 26.0–34.0)
MCHC: 31.9 g/dL (ref 30.0–36.0)
MCV: 96.4 fL (ref 78.0–100.0)
Platelets: 158 10*3/uL (ref 150–400)
RBC: 3.38 MIL/uL — AB (ref 3.87–5.11)
RDW: 15.8 % — AB (ref 11.5–15.5)
WBC: 4.9 10*3/uL (ref 4.0–10.5)

## 2017-10-20 LAB — URINALYSIS, ROUTINE W REFLEX MICROSCOPIC
BILIRUBIN URINE: NEGATIVE
Glucose, UA: NEGATIVE mg/dL
HGB URINE DIPSTICK: NEGATIVE
KETONES UR: NEGATIVE mg/dL
NITRITE: NEGATIVE
PH: 5 (ref 5.0–8.0)
Protein, ur: NEGATIVE mg/dL
SPECIFIC GRAVITY, URINE: 1.019 (ref 1.005–1.030)

## 2017-10-20 MED ORDER — IPRATROPIUM-ALBUTEROL 0.5-2.5 (3) MG/3ML IN SOLN
3.0000 mL | Freq: Four times a day (QID) | RESPIRATORY_TRACT | Status: DC | PRN
  Administered 2017-10-21 – 2017-10-22 (×2): 3 mL via RESPIRATORY_TRACT
  Filled 2017-10-20 (×2): qty 3

## 2017-10-20 NOTE — Care Management Important Message (Signed)
Important Message  Patient Details  Name: LATAJA NEWLAND MRN: [id number] Date of Birth: 11/15/1927   Medicare Important Message Given:  Yes    Kerin Salen 10/20/2017, 10:48 AMImportant Message  Patient Details  Name: DENAY PLEITEZ MRN: [id number] Date of Birth: [birth year]   Medicare Important Message Given:  Yes    Kerin Salen 10/20/2017, 10:48 AM

## 2017-10-20 NOTE — Progress Notes (Signed)
Palliative care progress note Reason for consult: Goals of care in light of dementia, GIST, and current admission for pneumonia  Chart reviewed and discussed with bedside RN.  I met today briefly with Ms. Staples (she is confused and cannot participate in conversation).  I met with her 2 sons and 2 daughters-in-law.  Mickel Baas (dtr in Software engineer) has been serving as Fish farm manager for family.  I introduced palliative care as specialized medical care for people living with serious illness. It focuses on providing relief from the symptoms and stress of a serious illness. The goal is to improve quality of life for both the patient and the family.  We discussed Ms. Goold's clinical course as well as wishes moving forward in regard to advanced directives.  Values and goals of care important to patient and family were attempted to be elicited.  Concepts specific to code status and rehospitalization discussed.  We discussed difference between a aggressive medical intervention path and a palliative, comfort focused care path.    Concept of Hospice and Palliative Care were discussed.  - Family is hopeful that she will be able to participate in skilled rehab with goal for rehab being that she regain ability to assist in transfers from bed to wheelchair.  Family reports that she was able to do this just a few weeks ago and this is key to her being able to return home.  They are also realistic that she may require long term care moving forward.  Currently she has remained at home with assistance of 24 hour caregivers.  They are discussing option for private pay long term care if needed.  Family is hopeful that she will eventually be able to get to [facility]. - She remains full code at this time.  Discussed goal of medical care is to allow Ms. Chrisman to be well enough to enjoy time outside of the hospital.  We also discussed how pursuing aggressive measures is unlikely to result in her being well enough to leave  the hospital in a meaningful way.  Her family reports that they understand this, but she had previously completed paperwork stating desire for aggressive care.  Family to discuss if they believe that this would still be her desire if she were to understand her current clinical situation.  I provided copies of Hard Choices for Loving People for family to review. - Recommend palliative care to continue to follow at skilled facility.  Total time: 55 minutes Greater than 50%  of this time was spent counseling and coordinating care related to the above assessment and plan.  Micheline Rough, MD [facility] [phone]

## 2017-10-20 NOTE — Progress Notes (Addendum)
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   She was found to have a right pleural effusion and anemia.  Occult stool was negative.  She was transfused 2 units of blood and underwent a thoracentesis on 12/12 with removal of 1.8 L of dark fluid.  Cytology was negative for malignancy.  Fluid was exudative, culture negative.  She was started on levofloxacin and diuresed but her effusion worsened.  Modified barium swallow demonstrated no evidence of aspiration but CT chest demonstrated bilateral pneumonia, with fluid in the airways suggesting aspiration.  Her antibiotics were changed to cover aspiration pneumonia on 12/16.  Pulmonology and palliative care were consulted.    Assessment & Plan:  Acute hypoxic respiratory distress due to large right-sided exudative pleural effusion by LDH criteria, possibly due to aspiration pneumonia -  Continue ceftriaxone + flagyl, day 3 -  Palliative care and pulmonology assistance appreciated -  pulm feels effusion may get better with conservative measures  Acute on chronic diastolic heart failure, BUN and creatinine improving by holding lasix - Echocardiogram: Grade 1 diastolic dysfunction with elevated left and right heart filling pressures -  Hold lasix  Incidental finding on CT:  Air within the biliary tree, likely contained.  Patient does not exhibits signs or symptoms of necrotic bowel or other abdominal catastrophe.     Anemia due to chronic disease, occult stool negative. -  Hemoglobin continues to improve.   -  Transfused 2 unit PRBC on 12/11  Demand ischemia due to anemia and pleural effusion -Troponin first set is 0.67, subsequent troponin 0 0.19, 0.19, 0.15  -  EKG is negative for any acute ST-T changes.  - No regional wall motion abnormalities on ECHO  Paroxysmal atrial fibrillation, not on anticoagulation due to her history of thrombocytopenia and deconditioning/falls risk.  Essential hypertension, now with hypotension, holding diuretics.  Discontinued losartan.  Urinary incontinence, previously with chronic indwelling foley but foley would pop out frequently because she has a large urethra.  Was going to call to see if Dahlstedt had any other ideas or if a pessary might be helpful but didn't get around to it before 5pm. Currently using purewik. -  Please call Dahlstedt tomorrow (he's a family friend of the patient) to ask about options/replacing foley  GIST with Liver Mets, follows at [facility] and stable on [place] History of acoustic neuroma resected in 1995 History of breast cancer status post mastectomy, in remission Alzheimer's dementia, continue aricept Hyponatremia, resolved with diuresis Hypokalemia, likely to secondary to diuresis, resolved with oral potassium repletion Left chest wall pain, likely musculoskeletal, now resolved. Stage 1 pressure ulcer on sacrum:  allevyn.  DVT prophylaxis:  lovenox Code Status:  Full  Family Communication:  Patient and caretaker at bedside Disposition Plan: Patient does not appear to be improving.  Palliative care and pulmonology consulted.  Family hoping patient can go to [facility] SNF, otherwise home with private caregivers.  Consultants:   RAdiology  Palliative care  Pulmonology  Speech therapy  Procedures:  Thoracentesis on 12/12 MBS on 12/17  Antimicrobials:  Anti-infectives (From admission, onward)   Start     Dose/Rate Route Frequency Ordered Stop   10/19/17 1400  metroNIDAZOLE (FLAGYL) tablet 500 mg  500 mg Oral Every 8 hours 10/19/17 1033     10/18/17 1830  cefTRIAXone (ROCEPHIN) 1 g in dextrose 5 % 50 mL IVPB     1 g 100 mL/hr over 30 Minutes Intravenous Every 24 hours  10/18/17 1821     10/18/17 1830  metroNIDAZOLE (FLAGYL) IVPB 500 mg  Status:  Discontinued     500 mg 100 mL/hr over 60 Minutes Intravenous Every 8 hours 10/18/17 1821 10/19/17 1033   10/16/17 0000  levofloxacin (LEVAQUIN) 750 MG tablet     750 mg Oral Daily 10/16/17 1005 10/26/17 2359   10/15/17 2000  levofloxacin (LEVAQUIN) IVPB 750 mg  Status:  Discontinued     750 mg 100 mL/hr over 90 Minutes Intravenous Every 24 hours 10/14/17 2200 10/18/17 1806   10/14/17 2000  levofloxacin (LEVAQUIN) IVPB 750 mg  Status:  Discontinued     750 mg 100 mL/hr over 90 Minutes Intravenous Every 48 hours 10/13/17 2132 10/14/17 2200   10/13/17 1930  cefTRIAXone (ROCEPHIN) 1 g in dextrose 5 % 50 mL IVPB     1 g 100 mL/hr over 30 Minutes Intravenous  Once 10/13/17 1915 10/13/17 2058   10/13/17 1930  azithromycin (ZITHROMAX) tablet 500 mg     500 mg Oral  Once 10/13/17 1915 10/13/17 2016       Subjective:  Patient hx limited by dementia.  Caregiver said she had a lot of pain in her bottom when she was turned.  She continues to cough but has not complained of SOB or chest pain.    Objective: Vitals:   10/20/17 0453 10/20/17 0831 10/20/17 1500 10/20/17 1822  BP: 93/65  109/71   Pulse: (!) 119  (!) 106   Resp: (!) 22  20   Temp: 98.1 F (36.7 C)  (!) 97.3 F (36.3 C) 97.6 F (36.4 C)  TempSrc: Oral  Oral Oral  SpO2: 91% 92% 94%   Weight:      Height:        Intake/Output Summary (Last 24 hours) at 10/20/2017 1916 Last data filed at 10/20/2017 1822 Gross per 24 hour  Intake 480 ml  Output 700 ml  Net -220 ml   Filed Weights   10/17/17 2046 10/18/17 0542 10/19/17 0532  Weight: 98.1 kg (216 lb 4.8 oz) 91.7 kg (202 lb 1.6 oz) 90.5 kg (199 lb 8 oz)    Examination:  General exam:  Adult female, obvious facial droop (chronic).  No acute distress.  HEENT:  NCAT, MMM Respiratory system:  Diminished with rales throughout the right lung fields, no wheeze.   Cardiovascular system: IRRR today  (previously regular), normal S1/S2. No murmurs, rubs, gallops or clicks.  Warm extremities Gastrointestinal system: Normal active bowel sounds, soft, nondistended, nontender. MSK:  Normal tone and bulk, no lower extremity edema Neuro:  Diffusely weak, right facial droop   Data Reviewed: I have personally reviewed following labs and imaging studies  CBC: Recent Labs  Lab 10/16/17 0607 10/17/17 0634 10/18/17 0556 10/19/17 0531 10/20/17 0527  WBC 4.6 5.9 5.9 6.2 4.9  HGB 9.2* 9.4* 10.0* 10.4* 10.4*  HCT 27.3* 29.3* 30.2* 32.2* 32.6*  MCV 95.1 97.0 96.5 96.7 96.4  PLT 140* 162 160 153 497   Basic Metabolic Panel: Recent Labs  Lab 10/16/17 0607 10/17/17 0634 10/18/17 0556 10/19/17 0531 10/20/17 0527  NA 138 139 139 141 141  K 3.3* 3.1* 3.2* 4.1 3.7  CL 104 101 102 106 107  CO2 29 30 30  29  28  GLUCOSE 98 104* 112* 116* 102*  BUN 19 20 23* 29* 31*  CREATININE 0.82 0.76 0.74 0.90 0.74  CALCIUM 8.0* 7.9* 7.7* 7.9* 8.0*  MG  --  1.7  --   --   --    GFR: Estimated Creatinine Clearance: 54 mL/min (by C-G formula based on SCr of 0.74 mg/dL). Liver Function Tests: Recent Labs  Lab 10/14/17 1552  AST 43*  ALT 15  ALKPHOS 62  BILITOT 0.9  PROT 5.3*  ALBUMIN 3.0*   No results for input(s): LIPASE, AMYLASE in the last 168 hours. No results for input(s): AMMONIA in the last 168 hours. Coagulation Profile: Recent Labs  Lab 10/14/17 1552  INR 1.25   Cardiac Enzymes: Recent Labs  Lab 10/14/17 1552 10/14/17 2227 10/15/17 0418  TROPONINI 0.19* 0.19* 0.15*   BNP (last 3 results) No results for input(s): PROBNP in the last 8760 hours. HbA1C: No results for input(s): HGBA1C in the last 72 hours. CBG: Recent Labs  Lab 10/17/17 0736  GLUCAP 107*   Lipid Profile: No results for input(s): CHOL, HDL, LDLCALC, TRIG, CHOLHDL, LDLDIRECT in the last 72 hours. Thyroid Function Tests: No results for input(s): TSH, T4TOTAL, FREET4, T3FREE, THYROIDAB in the last 72  hours. Anemia Panel: No results for input(s): VITAMINB12, FOLATE, FERRITIN, TIBC, IRON, RETICCTPCT in the last 72 hours. Urine analysis:    Component Value Date/Time   COLORURINE YELLOW 10/20/2017 1730   APPEARANCEUR HAZY (A) 10/20/2017 1730   LABSPEC 1.019 10/20/2017 1730   PHURINE 5.0 10/20/2017 1730   GLUCOSEU NEGATIVE 10/20/2017 1730   HGBUR NEGATIVE 10/20/2017 1730   BILIRUBINUR NEGATIVE 10/20/2017 1730   BILIRUBINUR negative 04/05/2016 1414   KETONESUR NEGATIVE 10/20/2017 1730   PROTEINUR NEGATIVE 10/20/2017 1730   UROBILINOGEN 0.2 04/05/2016 1414   UROBILINOGEN 1.0 07/07/2015 1901   NITRITE NEGATIVE 10/20/2017 1730   LEUKOCYTESUR TRACE (A) 10/20/2017 1730   Sepsis Labs: @LABRCNTIP (procalcitonin:4,lacticidven:4)  ) Recent Results (from the past 240 hour(s))  Culture, blood (routine x 2)     Status: None   Collection Time: 10/13/17  8:03 PM  Result Value Ref Range Status   Specimen Description BLOOD LEFT ANTECUBITAL  Final   Special Requests   Final    BOTTLES DRAWN AEROBIC AND ANAEROBIC Blood Culture adequate volume   Culture   Final    NO GROWTH 5 DAYS Performed at [facility], [place] [address], [place], [place] [address]    Report Status 10/19/2017 FINAL  Final  Culture, blood (routine x 2)     Status: None   Collection Time: 10/13/17  9:27 PM  Result Value Ref Range Status   Specimen Description BLOOD LEFT ARM  Final   Special Requests   Final    BOTTLES DRAWN AEROBIC AND ANAEROBIC Blood Culture adequate volume   Culture   Final    NO GROWTH 5 DAYS Performed at [facility] [address], [place], [place] [address]    Report Status 10/19/2017 FINAL  Final  Body fluid culture     Status: None   Collection Time: 10/14/17  2:47 PM  Result Value Ref Range Status   Specimen Description PLEURAL  Final   Special Requests RIGHT  Final   Gram Stain   Final    FEW WBC PRESENT, PREDOMINANTLY MONONUCLEAR NO ORGANISMS SEEN    Culture   Final    No  growth aerobically or anaerobically. Performed at [facility], [place] [address],  [place], [place] [address]    Report Status 10/18/2017 FINAL  Final      Radiology Studies: Dg Chest 2 View  Result Date: 10/19/2017 CLINICAL DATA:  Follow-up pleural effusion. EXAM: CHEST  2 VIEW COMPARISON:  10/18/2017 CT and chest radiograph, and prior studies. FINDINGS: A moderate to large right pleural effusion and small left pleural effusion are again noted. Moderate right mid and lower lung atelectasis/ consolidation and mild left basilar atelectasis are unchanged. There is no evidence of pneumothorax. No acute bony abnormalities are identified. Severe degenerative changes in both shoulders again noted. IMPRESSION: Unchanged appearance of the chest with moderate to large right pleural effusion, moderate right mid and lower lung atelectasis/consolidation, small left pleural effusion and mild left basilar atelectasis. Electronically Signed   By: Margarette [place] M.D.   On: 10/19/2017 14:38   Dg Swallowing Func-speech Pathology  Result Date: 10/19/2017 Objective Swallowing Evaluation: Type of Study: MBS-Modified Barium Swallow Study  Patient Details Name: Melanie Trevino MRN: [id number] Date of Birth: [birth year] Today's Date: 10/19/2017 Time: SLP Start Time (ACUTE ONLY): 1340 -SLP Stop Time (ACUTE ONLY): 1400 SLP Time Calculation (min) (ACUTE ONLY): 20 min Past Medical History: Past Medical History: Diagnosis Date . Breast cancer ([place]) 08/18/2012 . Cancer ([place])  . Chronic indwelling Foley catheter 07/08/2015 . Dementia of the Alzheimer's type 07/08/2015 . Gastrointestinal stromal tumor (GIST) ([place]) 07/08/2015 . High cholesterol  . Hypertension  . Liver metastasis ([place]) 07/08/2015 . Renal mass 07/08/2015 . Wheelchair bound 07/08/2015 Past Surgical History: Past Surgical History: Procedure Laterality Date . 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 She had been making gurgling noises in her chest and wheezing for several days prior to admission. Chest x-ray demonstrated a large right-sided pleural effusion. Per MD concern for aspiration PNA. Chest CT 10/18/17: Patchy airspace opacities in both lungs worrisome for pneumonia, fluid in the airways suggests aspiration. BSE results 10/18/17 recommended MBS to rule out silent aspiration.  Subjective: Pt seen in radiology for objective assessment of swallow function and safety, and to rule out silent aspiration Assessment / Plan / Recommendation CHL IP CLINICAL IMPRESSIONS 10/19/2017 Clinical Impression Pt presents with normal oropharyngeal swallow.  No oral issues. Timely swallow reflex, no penetration or aspiration, and no post-swallow residue noted. Recommend continuing with regular diet and thin liquids. Safe swallow precautions sent to room with pt/transport. SLP Visit Diagnosis Dysphagia, oropharyngeal phase (R13.12) Impact on safety and function Mild aspiration risk   CHL IP TREATMENT RECOMMENDATION 10/19/2017 Treatment Recommendations No treatment recommended at this time   Prognosis 10/19/2017 Prognosis for Safe Diet Advancement Good CHL IP DIET RECOMMENDATION 10/19/2017 SLP Diet Recommendations Regular solids;Thin liquid Liquid Administration via Cup;Straw Medication Administration Whole meds with liquid Compensations Minimize environmental distractions;Slow rate;Small sips/bites Postural Changes Seated upright at 90 degrees   CHL IP OTHER RECOMMENDATIONS 10/19/2017 Oral Care Recommendations Oral care BID   CHL IP FOLLOW UP RECOMMENDATIONS 10/19/2017 Follow up Recommendations None        CHL IP ORAL PHASE  10/19/2017 Oral Phase WFL  CHL IP PHARYNGEAL PHASE 10/19/2017 Pharyngeal Phase WFL  CHL IP CERVICAL ESOPHAGEAL PHASE 10/19/2017 Cervical Esophageal Phase WFL Celia B. Bueche, MSP, CCC-SLP Speech Language  Pathologist [phone] Shonna Chock 10/19/2017, 2:43 PM                Scheduled Meds: . docusate sodium  100 mg Oral BID  . donepezil  10 mg Oral QHS  . enoxaparin (LOVENOX) injection  40 mg Subcutaneous Q24H  . fluticasone  1 spray Each Nare Daily  . ipratropium-albuterol  3 mL Nebulization BID  . metroNIDAZOLE  500 mg Oral Q8H  . mirabegron ER  50 mg Oral QHS  . vitamin B-12  1,000 mcg Oral BID   Continuous Infusions: . cefTRIAXone (ROCEPHIN)  IV Stopped (10/20/17 1825)     LOS: 7 days    Time spent: 30 min    Janece Canterbury, MD [facility] Pager [phone]  If 7PM-7AM, please contact night-coverage [email or web address] Password [facility] 10/20/2017, 7:16 PM

## 2017-10-20 NOTE — Clinical Social Work Note (Signed)
Clinical Social Work Assessment  Patient Details  Name: Melanie Trevino MRN: [id number] Date of Birth: 1928-09-30  Date of referral:  10/20/17               Reason for consult:  Facility Placement, Discharge Planning                Permission sought to share information with:  Case Manager, Customer service manager, Family Supports Permission granted to share information::  Yes, Verbal Permission Granted  Name::     TEFL teacher::  SNF  Relationship::  Daughter in Financial trader Information:     Housing/Transportation Living arrangements for the past 2 months:  Single Family Home Source of Information:  Adult Children Patient Interpreter Needed:  None Criminal Activity/Legal Involvement Pertinent to Current Situation/Hospitalization:  No - Comment as needed Significant Relationships:  Adult Children, Warehouse manager, Other Family Members Lives with:  Adult Children Do you feel safe going back to the place where you live?  Yes Need for family participation in patient care:  Yes (Comment)  Care giving concerns:  No care giving concerns at the time of assessment.    Social Worker assessment / plan:  LCSW following for disposition.  Patient was admitted to hospital for Pleural effusion.  Patient has a history  significant of GIST with liver mets on [place] (follows at [facility]), breast cancer status post mastectomy, acoustic neuroma status post resection in 1995, atrial fibrillation, dementia, hypertension, chronic thrombocytopenia was brought to the ER for evaluation of hypoxia, shortness of breath and generalized weakness.  Patient is from home. Patient lives with son and daughter in law. Daughter in law, Mickel Baas is POA. Patient has 24 hour private sitters at home.   LCSW met briefly with patient at bedside private sitter was present.   LCSW spoke with Mickel Baas by phone. According to Mickel Baas patient is dependent in ADLs. Patient is wheel chair bound, but able to assist with transfer  at baseline.   Patient and family are interested in SNF at dc in hopes that patient will be strong enough to assist with transfers again.    PLAN: TBD based on bed availability and palliative consult.     Employment status:  Retired Forensic scientist:  Medicare PT Recommendations:  [place] / Referral to community resources:     Patient/Family's Response to care: Patients family is very active in care. Family appears to be pleased with current level of care. Family thankful of CSW services.   Patient/Family's Understanding of and Emotional Response to Diagnosis, Current Treatment, and Prognosis:  Family is understanding of current diagnosis. Family asked questions about PT recommendation for 24 hour supervision vs rehab. LCSW listened to concerns. Family asked questions about SNF vs LTC.   Emotional Assessment Appearance:  Appears stated age Attitude/Demeanor/Rapport:    Affect (typically observed):  Pleasant Orientation:  Oriented to Self Alcohol / Substance use:  Not Applicable Psych involvement (Current and /or in the community):  No (Comment)  Discharge Needs  Concerns to be addressed:  Discharge Planning Concerns Readmission within the last 30 days:  No Current discharge risk:  None Barriers to Discharge:  Continued Medical Work up, No SNF bed   Servando Snare, LCSW 10/20/2017, 1:53 PM

## 2017-10-20 NOTE — Progress Notes (Signed)
PULMONARY / CRITICAL CARE MEDICINE   Name: Melanie Trevino MRN: [id number] DOB: July 23, 1928    ADMISSION DATE:  10/13/2017 CONSULTATION DATE:  10/18/2017  REFERRING MD:  Dr. Sheran Fava, [facility]  CHIEF COMPLAINT:  Short of breath  HISTORY OF PRESENT ILLNESS:   83 yo female presented with complaints of cough, gurgling, and wheeze.  She also had progressive dyspnea.  She was also having chills and decreased appetite.  She was found to be hypoxic.  She was started on antibiotics for pneumonia.  She had thoracentesis by IR for Rt pleural effusion 12/12 with 1.8 liters of fluid removed. She continued to have trouble with her breathing and had recurrent of pleural effusion.  Pulmonary was asked to assess.  She has hx of dementia.  She is mostly wheelchair bound and needs assistance from aid for ADLs.  She is able to recognize immediate family, but has trouble with short term memory.  SUBJECTIVE:   Patient comfortable on room air lying in bed She passed her modified barium swallow today, is cleared for regular diet  VITAL SIGNS: BP 93/65 (BP Location: Right Arm)   Pulse (!) 119   Temp 98.1 F (36.7 C) (Oral)   Resp (!) 22   Ht 5\' 4"  (1.626 m)   Wt 90.5 kg (199 lb 8 oz)   SpO2 92%   BMI 34.24 kg/m   INTAKE / OUTPUT: I/O last 3 completed shifts: In: 850 [P.O.:600; IV Piggyback:250] Out: 900 [Urine:900]  PHYSICAL EXAMINATION: General: Elderly woman, no distress in bed HEENT: Very decreased hearing, no oral pharyngeal lesions, no stridor Neuro: Awake, alert /oriented to self, repeats questions, R facial droop (baseline), poor hearing, MAE  CV: Regular, no murmur PULM: Normal respiratory pattern, decreased at both bases particularly on the right GI: Soft, benign, positive bowel sounds Extremities: Venous stasis changes, 1+ lower extremity edema Skin: no rashes or lesions  LABS:  BMET Recent Labs  Lab 10/18/17 0556 10/19/17 0531 10/20/17 0527  NA 139 141 141  K 3.2* 4.1 3.7  CL 102  106 107  CO2 30 29 28   BUN 23* 29* 31*  CREATININE 0.74 0.90 0.74  GLUCOSE 112* 116* 102*    Electrolytes Recent Labs  Lab 10/17/17 0634 10/18/17 0556 10/19/17 0531 10/20/17 0527  CALCIUM 7.9* 7.7* 7.9* 8.0*  MG 1.7  --   --   --     CBC Recent Labs  Lab 10/18/17 0556 10/19/17 0531 10/20/17 0527  WBC 5.9 6.2 4.9  HGB 10.0* 10.4* 10.4*  HCT 30.2* 32.2* 32.6*  PLT 160 153 158    Coag's Recent Labs  Lab 10/14/17 1552  APTT 26  INR 1.25    Sepsis Markers No results for input(s): LATICACIDVEN, PROCALCITON, O2SATVEN in the last 168 hours.  ABG No results for input(s): PHART, PCO2ART, PO2ART in the last 168 hours.  Liver Enzymes Recent Labs  Lab 10/13/17 1750 10/14/17 1552  AST 48* 43*  ALT 16 15  ALKPHOS 66 62  BILITOT 0.7 0.9  ALBUMIN 3.3* 3.0*    Cardiac Enzymes Recent Labs  Lab 10/14/17 1552 10/14/17 2227 10/15/17 0418  TROPONINI 0.19* 0.19* 0.15*    Glucose Recent Labs  Lab 10/17/17 0736  GLUCAP 107*    Imaging Dg Chest 2 View  Result Date: 10/19/2017 CLINICAL DATA:  Follow-up pleural effusion. EXAM: CHEST  2 VIEW COMPARISON:  10/18/2017 CT and chest radiograph, and prior studies. FINDINGS: A moderate to large right pleural effusion and small left pleural effusion are  again noted. Moderate right mid and lower lung atelectasis/ consolidation and mild left basilar atelectasis are unchanged. There is no evidence of pneumothorax. No acute bony abnormalities are identified. Severe degenerative changes in both shoulders again noted. IMPRESSION: Unchanged appearance of the chest with moderate to large right pleural effusion, moderate right mid and lower lung atelectasis/consolidation, small left pleural effusion and mild left basilar atelectasis. Electronically Signed   By: Margarette Canada M.D.   On: 10/19/2017 14:38   Dg Swallowing Func-speech Pathology  Result Date: 10/19/2017 Objective Swallowing Evaluation: Type of Study: MBS-Modified Barium  Swallow Study  Patient Details Name: Melanie Trevino MRN: [id number] Date of Birth: 09/19/1928 Today's Date: 10/19/2017 Time: SLP Start Time (ACUTE ONLY): 1340 -SLP Stop Time (ACUTE ONLY): 1400 SLP Time Calculation (min) (ACUTE ONLY): 20 min Past Medical History: Past Medical History: Diagnosis Date . Breast cancer ([place]) 08/18/2012 . Cancer ([place])  . Chronic indwelling Foley catheter 07/08/2015 . Dementia of the Alzheimer's type 07/08/2015 . 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 High cholesterol  . Hypertension  . Liver metastasis ([place]) 07/08/2015 . Renal mass 07/08/2015 . Wheelchair bound 07/08/2015 Past Surgical History: Past Surgical History: Procedure Laterality Date . 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 She had been making gurgling noises in her chest and wheezing for several days prior to admission. Chest x-ray demonstrated a large right-sided pleural effusion. Per MD concern for aspiration PNA. Chest CT 10/18/17: Patchy airspace opacities in both lungs worrisome for pneumonia, fluid in the airways suggests aspiration. BSE results 10/18/17 recommended MBS to rule out silent aspiration.  Subjective: Pt seen in radiology for objective assessment of swallow function and safety, and to rule out silent aspiration Assessment / Plan / Recommendation CHL IP CLINICAL IMPRESSIONS 10/19/2017 Clinical Impression Pt presents with normal oropharyngeal swallow.  No oral issues. Timely swallow reflex, no penetration or aspiration, and no post-swallow residue noted. Recommend continuing with regular diet and thin liquids. Safe swallow  precautions sent to room with pt/transport. SLP Visit Diagnosis Dysphagia, oropharyngeal phase (R13.12) Impact on safety and function Mild aspiration risk   CHL IP TREATMENT RECOMMENDATION 10/19/2017 Treatment Recommendations No treatment recommended at this time   Prognosis 10/19/2017 Prognosis for Safe Diet Advancement Good CHL IP DIET RECOMMENDATION 10/19/2017 SLP Diet Recommendations Regular solids;Thin liquid Liquid Administration via Cup;Straw Medication Administration Whole meds with liquid Compensations Minimize environmental distractions;Slow rate;Small sips/bites Postural Changes Seated upright at 90 degrees   CHL IP OTHER RECOMMENDATIONS 10/19/2017 Oral Care Recommendations Oral care BID   CHL IP FOLLOW UP RECOMMENDATIONS 10/19/2017 Follow up Recommendations None        CHL IP ORAL PHASE 10/19/2017 Oral Phase WFL  CHL IP PHARYNGEAL PHASE 10/19/2017 Pharyngeal Phase WFL  CHL IP CERVICAL ESOPHAGEAL PHASE 10/19/2017 Cervical Esophageal Phase WFL Celia B. Quentin Ore, [facility], [place] Speech Language Pathologist [phone] Shonna Chock 10/19/2017, 2:43 PM                STUDIES:  Echo 12/12 >> mod LVH, EF 60 to 65%, grade 1 DD, PAS 36 mmHg Rt thoracentesis 12/12 >> 1.8 liters, glucose 104, protein < 3, LDH 190, WBC 680 (79%M), reactive mesothelial cells CT chest 12/16 >>  atherosclerosis, mod b/l effusions Rt > Lt, patchy ASD in Rt lung SLP Eval 12/16 >> rec's for regular diet / thin liquids on clinical exam, consider MBS MBS 12/18 >> no evidence of aspiration  CULTURES: Blood 12/11 >> Rt pleural fluid 12/12 >> negative  ANTIBIOTICS: Zithromax 12/11 >> 12/11 Rocephin 12/11 >> 12/11 Levaquin 12/12 >> 12/16 Zosyn 12/16 >>   SIGNIFICANT EVENTS: 12/11 Admit 12/16 consult speech and palliative care  DISCUSSION: 83 yo female likely with aspiration pneumonia and pleural effusion.  She has baseline dementia, wheelchair bound, and needs assistance with ADLs.  ASSESSMENT / PLAN:  Aspiration  pneumonia P: Complete Zosyn, recommend day 7 of 7 today is Abx  Diet as ordered, aspiration precautions  Dysphagia. P: No overt aspiration on MBS from 12/17.  Continue standard swallowing precautions, standard diet  Exudative pleural effusion based on LDH P: Given her comfortable respiratory status with residual right-sided pleural effusion I do not believe that it is worth the risk to repeat her thoracentesis, place chest tube, etc.  Suspect that the effusion will resolve as it is culture negative.  Follow clinically  Alzheimer's dementia P: Agree with evaluation by palliative care  DVT prophylaxis - SCDs SUP - not indicated Nutrition - regular diet Goals of care - full code  Baltazar Apo, MD, PhD 10/20/2017, 1:39 PM [facility] [phone] or if no answer [phone]

## 2017-10-21 LAB — BASIC METABOLIC PANEL
ANION GAP: 7 (ref 5–15)
BUN: 34 mg/dL — ABNORMAL HIGH (ref 6–20)
CHLORIDE: 106 mmol/L (ref 101–111)
CO2: 29 mmol/L (ref 22–32)
Calcium: 7.8 mg/dL — ABNORMAL LOW (ref 8.9–10.3)
Creatinine, Ser: 0.98 mg/dL (ref 0.44–1.00)
GFR, EST AFRICAN AMERICAN: 58 mL/min — AB (ref >60)
GFR, EST NON AFRICAN AMERICAN: 50 mL/min — AB (ref >60)
Glucose, Bld: 98 mg/dL (ref 65–99)
POTASSIUM: 3.3 mmol/L — AB (ref 3.5–5.1)
SODIUM: 142 mmol/L (ref 135–145)

## 2017-10-21 MED ORDER — POTASSIUM CHLORIDE CRYS ER 20 MEQ PO TBCR
40.0000 meq | EXTENDED_RELEASE_TABLET | Freq: Two times a day (BID) | ORAL | Status: AC
  Administered 2017-10-21 – 2017-10-22 (×2): 40 meq via ORAL
  Filled 2017-10-21 (×2): qty 2

## 2017-10-21 NOTE — Progress Notes (Signed)
LCSW following for SNF placement.  LCSW spoke with Mickel Baas by phone.   LCSW discussed bed offers. There are no beds available at the facilities that the family prefer.   Mickel Baas stated that the family was still discussing with family and trying to see if they can provide the level of care she needs at home until something becomes available.   PLAN: TBD   Servando Snare, [place]

## 2017-10-21 NOTE — Progress Notes (Signed)
Palliative care progress note Reason for consult: Goals of care in light of dementia, GIST, and current admission for pneumonia  Chart reviewed and discussed with bedside RN.  I met today briefly with Ms. Bufkin (she is confused and cannot participate in conversation).  Called and discussed with Mickel Baas (dtr in law and RN).  We reviewed information from meeting yesterday as well as pathways forward.  Family hopeful that she will be able to participate in skilled rehab with goal for rehab being that she regain ability to assist in transfers from bed to wheelchair, but they are selective in facilities they are willing to consider.  Family is hopeful that she will eventually be able to get to [place] plan if no acceptable bed offers will be home with home health and continued assistance from hired caregivers.   She remains full code at this time.  Mickel Baas reports that family will continue to discuss if Ms. Strzelecki's desire would be for aggressive interventions if she were to understand her current clinical situation.  I provided copies of [facility] Choices for [facility] for family to review at meeting yesterday.  - Recommend palliative care to continue to follow at skilled facility if she discharges to SNF.  Total time: 30 minutes Greater than 50%  of this time was spent counseling and coordinating care related to the above assessment and plan.  Micheline Rough, MD [place] Team [phone]

## 2017-10-21 NOTE — Progress Notes (Signed)
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   She was found to have a right pleural effusion and anemia.  Occult stool was negative.  She was transfused 2 units of blood and underwent a thoracentesis on 12/12 with removal of 1.8 L of dark fluid.  Cytology was negative for malignancy.  Fluid was exudative, culture negative.  She was started on levofloxacin and diuresed but her effusion worsened.  Modified barium swallow demonstrated no evidence of aspiration but CT chest demonstrated bilateral pneumonia, with fluid in the airways suggesting aspiration.  Her antibiotics were changed to cover aspiration pneumonia on 12/16.  Pulmonology and palliative care were consulted.    12/19-persistently coughing... Assessment & Plan:   Principal Problem:   Pleural effusion Active Problems:   Breast cancer (HCC)   Dementia of the Alzheimer's type   Gastrointestinal stromal tumor (GIST) (H[facility])   Wheezing   Pressure injury of skin  Acute hypoxic respiratory distress due to large right-sided exudative pleural effusion by LDH criteria, possibly due to aspiration pneumonia -  Continue ceftriaxone + flagyl, day 4 -  Palliative care and pulmonology assistance appreciated -  pulm feels effusion may get better with conservative measures Follow up chest xray  Acute on chronic diastolic heart failure, BUN and creatinine improving by holding lasix - Echocardiogram: Grade 1 diastolic dysfunction with elevated left and right heart filling pressures -  Hold lasix  Incidental finding on CT:  Air within the biliary tree, likely contained.  Patient does not exhibits signs or symptoms of necrotic bowel or other abdominal catastrophe.     Anemia due to chronic disease,  occult stool negative. -  Hemoglobin continues to improve.   -  Transfused 2 unit PRBC on 12/11  Demand ischemia due to anemia and pleural effusion -Troponin first set is 0.67, subsequent troponin 0 0.19, 0.19, 0.15  - EKG is negative for any acute ST-T changes.  - No regional wall motion abnormalities on ECHO  Paroxysmal atrial fibrillation, not on anticoagulation due to her history of thrombocytopenia and deconditioning/falls risk. Essential hypertension, now with hypotension, holding diuretics.  Discontinued losartan.  Urinary incontinence, previously with chronic indwelling foley but foley would pop out frequently because she has a large urethra. - family to contact urologist.      DVT prophylaxis: Code Status:full Family Communication: dw dil Disposition Plan tbd Consultants:  Palliative,pccm Procedures:none Antimicrobials: Rocephin flagyl Subjective: coughing  Objective:resting in bed...coughing,.. Vitals:   10/20/17 2130 10/21/17 0615 10/21/17 0923 10/21/17 1327  BP:  (!) 109/49  (!) 134/59  Pulse:  63  64  Resp:  20  20  Temp:  98.4 F (36.9 C)  98.2 F (36.8 C)  TempSrc:  Oral  Oral  SpO2: 92% 93% 92% 92%  Weight:    98.1 kg (216 lb 4.8 oz)  Height:        Intake/Output Summary (Last 24 hours) at 10/21/2017 1723 Last data filed at 10/21/2017 1500 Gross per 24 hour  Intake 620 ml  Output 400 ml  Net 220 ml   Filed Weights   10/18/17 0542 10/19/17 0532 10/21/17 1327  Weight: 91.7 kg (202 lb 1.6 oz) 90.5 kg (199 lb 8 oz) 98.1 kg (216 lb 4.8 oz)    Examination:  General exam: Appears  calm and comfortable  Respiratory system:  Decreased breath sounds auscultation. Respiratory effort normal. Cardiovascular system: S1 & S2 heard, RRR. No JVD, murmurs, rubs, gallops or clicks. No pedal edema. Gastrointestinal system: Abdomen is nondistended, soft and nontender. No organomegaly or masses felt. Normal bowel sounds heard. Central nervous system: Alert  and oriented. No focal neurological deficits. Extremities: Symmetric 5 x 5 power. Skin: No rashes, lesions or ulcers     Data Reviewed: I have personally reviewed following labs and imaging studies  CBC: Recent Labs  Lab 10/16/17 0607 10/17/17 0634 10/18/17 0556 10/19/17 0531 10/20/17 0527  WBC 4.6 5.9 5.9 6.2 4.9  HGB 9.2* 9.4* 10.0* 10.4* 10.4*  HCT 27.3* 29.3* 30.2* 32.2* 32.6*  MCV 95.1 97.0 96.5 96.7 96.4  PLT 140* 162 160 153 128   Basic Metabolic Panel: Recent Labs  Lab 10/17/17 0634 10/18/17 0556 10/19/17 0531 10/20/17 0527 10/21/17 0616  NA 139 139 141 141 142  K 3.1* 3.2* 4.1 3.7 3.3*  CL 101 102 106 107 106  CO2 30 30 29 28 29   GLUCOSE 104* 112* 116* 102* 98  BUN 20 23* 29* 31* 34*  CREATININE 0.76 0.74 0.90 0.74 0.98  CALCIUM 7.9* 7.7* 7.9* 8.0* 7.8*  MG 1.7  --   --   --   --    GFR: Estimated Creatinine Clearance: 46 mL/min (by C-G formula based on SCr of 0.98 mg/dL). Liver Function Tests: No results for input(s): AST, ALT, ALKPHOS, BILITOT, PROT, ALBUMIN in the last 168 hours. No results for input(s): LIPASE, AMYLASE in the last 168 hours. No results for input(s): AMMONIA in the last 168 hours. Coagulation Profile: No results for input(s): INR, PROTIME in the last 168 hours. Cardiac Enzymes: Recent Labs  Lab 10/14/17 2227 10/15/17 0418  TROPONINI 0.19* 0.15*   BNP (last 3 results) No results for input(s): PROBNP in the last 8760 hours. HbA1C: No results for input(s): HGBA1C in the last 72 hours. CBG: Recent Labs  Lab 10/17/17 0736  GLUCAP 107*   Lipid Profile: No results for input(s): CHOL, HDL, LDLCALC, TRIG, CHOLHDL, LDLDIRECT in the last 72 hours. Thyroid Function Tests: No results for input(s): TSH, T4TOTAL, FREET4, T3FREE, THYROIDAB in the last 72 hours. Anemia Panel: No results for input(s): VITAMINB12, FOLATE, FERRITIN, TIBC, IRON, RETICCTPCT in the last 72 hours. Sepsis Labs: No results for input(s): PROCALCITON,  LATICACIDVEN in the last 168 hours.  Recent Results (from the past 240 hour(s))  Culture, blood (routine x 2)     Status: None   Collection Time: 10/13/17  8:03 PM  Result Value Ref Range Status   Specimen Description BLOOD LEFT ANTECUBITAL  Final   Special Requests   Final    BOTTLES DRAWN AEROBIC AND ANAEROBIC Blood Culture adequate volume   Culture   Final    NO GROWTH 5 DAYS Performed at [facility], [address], [place], [place] [address]    Report Status 10/19/2017 FINAL  Final  Culture, blood (routine x 2)     Status: None   Collection Time: 10/13/17  9:27 PM  Result Value Ref Range Status   Specimen Description BLOOD LEFT ARM  Final   Special Requests   Final    BOTTLES DRAWN AEROBIC AND ANAEROBIC Blood Culture adequate volume   Culture   Final    NO GROWTH 5 DAYS Performed at [facility] [address] [place], [place] [address]    Report Status 10/19/2017 FINAL  Final  Body fluid culture     Status: None   Collection Time: 10/14/17  2:47 PM  Result Value Ref Range Status   Specimen Description PLEURAL  Final   Special Requests RIGHT  Final   Gram Stain   Final    FEW WBC PRESENT, PREDOMINANTLY MONONUCLEAR NO ORGANISMS SEEN    Culture   Final    No growth aerobically or anaerobically. Performed at [facility], [place] [address], [place], [place] [address]    Report Status 10/18/2017 FINAL  Final         Radiology Studies: No results found.      Scheduled Meds: . docusate sodium  100 mg Oral BID  . donepezil  10 mg Oral QHS  . enoxaparin (LOVENOX) injection  40 mg Subcutaneous Q24H  . fluticasone  1 spray Each Nare Daily  . metroNIDAZOLE  500 mg Oral Q8H  . mirabegron ER  50 mg Oral QHS  . vitamin B-12  1,000 mcg Oral BID   Continuous Infusions: . cefTRIAXone (ROCEPHIN)  IV Stopped (10/20/17 1825)     LOS: 8 days     Georgette Shell, MD [facility] If 7PM-7AM, please contact  night-coverage [email or web address] Password [facility] 10/21/2017, 5:23 PM

## 2017-10-22 ENCOUNTER — Inpatient Hospital Stay: Payer: No Typology Code available for payment source | Admitting: Internal Medicine

## 2017-10-22 ENCOUNTER — Inpatient Hospital Stay (HOSPITAL_COMMUNITY): Payer: Medicare Other

## 2017-10-22 LAB — BASIC METABOLIC PANEL
ANION GAP: 4 — AB (ref 5–15)
BUN: 28 mg/dL — ABNORMAL HIGH (ref 6–20)
CO2: 30 mmol/L (ref 22–32)
Calcium: 8 mg/dL — ABNORMAL LOW (ref 8.9–10.3)
Chloride: 110 mmol/L (ref 101–111)
Creatinine, Ser: 0.83 mg/dL (ref 0.44–1.00)
GLUCOSE: 97 mg/dL (ref 65–99)
POTASSIUM: 3.7 mmol/L (ref 3.5–5.1)
Sodium: 144 mmol/L (ref 135–145)

## 2017-10-22 LAB — URINE CULTURE: Culture: NO GROWTH

## 2017-10-22 MED ORDER — PIPERACILLIN-TAZOBACTAM 3.375 G IVPB 30 MIN: 3.3750 g | Freq: Three times a day (TID) | INTRAVENOUS | Status: DC

## 2017-10-22 MED ORDER — SODIUM CHLORIDE 0.9 % IV SOLN
1.0000 g | Freq: Three times a day (TID) | INTRAVENOUS | Status: DC
  Administered 2017-10-22 – 2017-10-30 (×23): 1 g via INTRAVENOUS
  Filled 2017-10-22 (×25): qty 1

## 2017-10-22 MED ORDER — POTASSIUM CHLORIDE CRYS ER 20 MEQ PO TBCR
20.0000 meq | EXTENDED_RELEASE_TABLET | Freq: Once | ORAL | Status: AC
  Administered 2017-10-22: 20 meq via ORAL
  Filled 2017-10-22: qty 1

## 2017-10-22 MED ORDER — FUROSEMIDE 10 MG/ML IJ SOLN
20.0000 mg | Freq: Once | INTRAMUSCULAR | Status: AC
  Administered 2017-10-22: 20 mg via INTRAVENOUS
  Filled 2017-10-22: qty 2

## 2017-10-22 MED ORDER — IPRATROPIUM-ALBUTEROL 0.5-2.5 (3) MG/3ML IN SOLN: 3.0000 mL | Freq: Two times a day (BID) | RESPIRATORY_TRACT | Status: DC

## 2017-10-22 MED ORDER — IPRATROPIUM-ALBUTEROL 0.5-2.5 (3) MG/3ML IN SOLN
3.0000 mL | Freq: Four times a day (QID) | RESPIRATORY_TRACT | Status: DC
  Administered 2017-10-22 (×3): 3 mL via RESPIRATORY_TRACT
  Filled 2017-10-22 (×3): qty 3

## 2017-10-22 NOTE — Progress Notes (Signed)
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 Occult stool was negative. She was transfused 2 units of blood and underwent a thoracentesis on 12/12 with removal of 1.8 L of dark fluid. Cytology was negative for malignancy. Fluid was exudative, culture negative.She was started on levofloxacin and diuresed but her effusion worsened. Modified barium swallow demonstrated no evidence of aspiration but CT chest demonstrated bilateralpneumonia, with fluid in the airways suggesting aspiration. Her antibiotics were changed to cover aspiration pneumonia on 12/16. Pulmonology and palliative care were consulted  12/20-patient continues to cough and oxygen depenedent.patient refused palliative care services.care giver reports patient vomitted breakfast. Assessment & Plan:   Principal Problem:   Pleural effusion Active Problems:   Breast cancer (HCC)   Dementia of the Alzheimer's type   Gastrointestinal stromal tumor (GIST) (HCC)   Wheezing   Pressure injury of skin Acute hypoxic respiratory distress due to large right-sided exudative pleural effusion by LDH criteria, possibly due to aspiration pneumonia - Continue ceftriaxone + flagyl, day 4 - Palliative care and pulmonology assistance appreciated - pulm feels effusion may get better with conservative measures Follow up chest xray Right pleural effusion appears slightly more prominent. 2. Increased consolidation at the left lung base. 3. Persistent small left effusion. 4. Aortic atherosclerosis. -on rocephin and flagyl day 4. -await recommendations from pulmonary. Acute on chronic diastolic heart failure, BUN and  creatinineimproving by holding lasix -Echocardiogram: Grade 1 diastolic dysfunction with elevated left and right heart filling pressures - Hold lasix  Incidental finding on CT:Air within the biliary tree, likely contained. Patient does not exhibits signs or symptoms of necrotic bowel or other abdominal catastrophe.   Anemia due to chronic disease,occult stool negative. -Hemoglobin continues to improve.  - Transfused 2 unit PRBC on 12/11  Demand ischemia due to anemia and pleural effusion -Troponin first set is 0.67, subsequent troponin 0 0.19, 0.19, 0.15  - EKG is negative for any acute ST-T changes.  - No regional wall motion abnormalities on ECHO  Paroxysmal atrial fibrillation, not on anticoagulation due to her history of thrombocytopenia and deconditioning/falls risk. Essential hypertension, now with hypotension, holding diuretics. [place].  Urinary incontinence,previously with chronic indwelling foley but foley would pop out frequently because she has a large urethra. - family contacted urologist who will dw dil.      DVT prophylaxis:lovenox Code Status: Full code Family Communication: No family available today but discussed with daughter-in-law yesterday Disposition Plan TBD  Consultants:  Pulmonary and palliative care  Procedures thoracentesis Antimicrobials: Rocephin and Flagyl  Subjective: Complains of cough vomited one time today had a bowel movement yesterday.  Objective: Awake alert talking in full sentences answer all my questions appropriately Vitals:   10/21/17 2211 10/22/17 0427 10/22/17 0500 10/22/17 0830  BP:  133/61    Pulse: 62 68    Resp: 20 20    Temp: 98.4 F (36.9 C)     TempSrc: Oral     SpO2: 91% 92%  92%  Weight:   89.3 kg (196 lb 14.4 oz)   Height:        Intake/Output Summary (Last 24 hours) at 10/22/2017 1219 Last data filed at 10/22/2017 0800 Gross per 24 hour  Intake 530 ml  Output 900 ml  Net  -370 ml   Filed Weights   10/19/17 0532 10/21/17 1327 10/22/17 0500  Weight: 90.5 kg (199 lb 8 oz) 98.1 kg (216 lb 4.8 oz) 89.3 kg (196 lb 14.4 oz)    Examination:  General exam: Appears calm and comfortable  Respiratory system:crackles at base to auscultation. Respiratory effort normal. Cardiovascular system: S1 & S2 heard, RRR. No JVD, murmurs, rubs, gallops or clicks. No pedal edema. Gastrointestinal system: Abdomen is nondistended, soft and nontender. No organomegaly or masses felt. Normal bowel sounds heard. Central nervous system: Alert and oriented. No focal neurological deficits. Extremities: Symmetric 5 x 5 power. Skin: No rashes, lesions or ulcers  Data Reviewed: I have personally reviewed following labs and imaging studies  CBC: Recent Labs  Lab 10/16/17 0607 10/17/17 0634 10/18/17 0556 10/19/17 0531 10/20/17 0527  WBC 4.6 5.9 5.9 6.2 4.9  HGB 9.2* 9.4* 10.0* 10.4* 10.4*  HCT 27.3* 29.3* 30.2* 32.2* 32.6*  MCV 95.1 97.0 96.5 96.7 96.4  PLT 140* 162 160 153 841   Basic Metabolic Panel: Recent Labs  Lab 10/17/17 0634 10/18/17 0556 10/19/17 0531 10/20/17 0527 10/21/17 0616 10/22/17 0528  NA 139 139 141 141 142 144  K 3.1* 3.2* 4.1 3.7 3.3* 3.7  CL 101 102 106 107 106 110  CO2 30 30 29 28 29 30   GLUCOSE 104* 112* 116* 102* 98 97  BUN 20 23* 29* 31* 34* 28*  CREATININE 0.76 0.74 0.90 0.74 0.98 0.83  CALCIUM 7.9* 7.7* 7.9* 8.0* 7.8* 8.0*  MG 1.7  --   --   --   --   --    GFR: Estimated Creatinine Clearance: 51.6 mL/min (by C-G formula based on SCr of 0.83 mg/dL). Liver Function Tests: No results for input(s): AST, ALT, ALKPHOS, BILITOT, PROT, ALBUMIN in the last 168 hours. No results for input(s): LIPASE, AMYLASE in the last 168 hours. No results for input(s): AMMONIA in the last 168 hours. Coagulation Profile: No results for input(s): INR, PROTIME in the last 168 hours. Cardiac Enzymes: No results for input(s): CKTOTAL, CKMB, CKMBINDEX, TROPONINI in  the last 168 hours. BNP (last 3 results) No results for input(s): PROBNP in the last 8760 hours. HbA1C: No results for input(s): HGBA1C in the last 72 hours. CBG: Recent Labs  Lab 10/17/17 0736  GLUCAP 107*   Lipid Profile: No results for input(s): CHOL, HDL, LDLCALC, TRIG, CHOLHDL, LDLDIRECT in the last 72 hours. Thyroid Function Tests: No results for input(s): TSH, T4TOTAL, FREET4, T3FREE, THYROIDAB in the last 72 hours. Anemia Panel: No results for input(s): VITAMINB12, FOLATE, FERRITIN, TIBC, IRON, RETICCTPCT in the last 72 hours. Sepsis Labs: No results for input(s): PROCALCITON, LATICACIDVEN in the last 168 hours.  Recent Results (from the past 240 hour(s))  Culture, blood (routine x 2)     Status: None   Collection Time: 10/13/17  8:03 PM  Result Value Ref Range Status   Specimen Description BLOOD LEFT ANTECUBITAL  Final   Special Requests   Final    BOTTLES DRAWN AEROBIC AND ANAEROBIC Blood Culture adequate volume   Culture   Final    NO GROWTH 5 DAYS Performed at [facility], [address], [place], [place] [address]    Report Status 10/19/2017 FINAL  Final  Culture, blood (routine x 2)     Status: None   Collection Time: 10/13/17  9:27 PM  Result Value Ref Range Status   Specimen Description BLOOD LEFT ARM  Final   Special Requests   Final    BOTTLES DRAWN AEROBIC AND ANAEROBIC Blood Culture adequate volume   Culture   Final    NO GROWTH 5 DAYS Performed at [facility], [address], [place], [place] [address]    Report Status 10/19/2017 FINAL  Final  Body fluid culture     Status: None   Collection Time: 10/14/17  2:47 PM  Result Value Ref Range Status   Specimen Description PLEURAL  Final   Special Requests RIGHT  Final   Gram Stain   Final    FEW WBC PRESENT, PREDOMINANTLY MONONUCLEAR NO ORGANISMS SEEN    Culture   Final    No growth aerobically or anaerobically. Performed at [facility], [place] [address], [place], [place]  [address]    Report Status 10/18/2017 FINAL  Final  Culture, Urine     Status: None   Collection Time: 10/20/17  5:30 PM  Result Value Ref Range Status   Specimen Description URINE, CLEAN CATCH  Final   Special Requests NONE  Final   Culture   Final    NO GROWTH Performed at [facility], [place] [address] [place], [place] [address]    Report Status 10/22/2017 FINAL  Final         Radiology Studies: Dg Chest 1 View  Result Date: 10/22/2017 CLINICAL DATA:  Cough.  Pleural effusions. EXAM: CHEST 1 VIEW COMPARISON:  Chest x-rays dated 10/19/2017 and 10/18/2017 and CT scan dated 10/18/2017 FINDINGS: The right pleural effusion appears more prominent although this may be positional. Small left effusion process. Increased consolidation at the left lung base as indicated by the loss of air bronchograms. Pulmonary vascularity is normal. Heart size is unchanged in appears slightly prominent. Aortic atherosclerosis. No acute bone abnormality. Fairly severe arthritic changes of both shoulders. Surgical clips to the right of the trachea just above the thoracic inlet consistent with previous thyroid surgery. IMPRESSION: 1. Right pleural effusion appears slightly more prominent. 2. Increased consolidation at the left lung base. 3. Persistent small left effusion. 4. Aortic atherosclerosis. Electronically Signed   By: Lorriane Shire M.D.   On: 10/22/2017 10:59   Dg Abd 1 View  Result Date: 10/22/2017 CLINICAL DATA:  Nausea and vomiting. EXAM: ABDOMEN - 1 VIEW COMPARISON:  CT scan of the chest dated 10/18/2017 FINDINGS: There is contrast in nondistended loops of large and small bowel. No visible free air in the abdomen. Air is seen in the biliary tree. Bilateral pleural effusions, right greater than left. No acute bone abnormality. Severe arthritis of the right hip. Left total hip prosthesis. IMPRESSION: No acute abnormality of the abdomen. No evidence of bowel obstruction. Air in the biliary tree as  previously described on the chest CT scan of 10/18/2017. Electronically Signed   By: Lorriane Shire M.D.   On: 10/22/2017 10:55        Scheduled Meds: . docusate sodium  100 mg Oral BID  . donepezil  10 mg Oral QHS  . enoxaparin (LOVENOX) injection  40 mg Subcutaneous Q24H  . fluticasone  1 spray Each Nare Daily  . ipratropium-albuterol  3 mL Nebulization BID  . ipratropium-albuterol  3 mL Nebulization QID  . metroNIDAZOLE  500 mg Oral Q8H  . mirabegron ER  50 mg Oral QHS  . vitamin B-12  1,000 mcg Oral BID   Continuous Infusions: . cefTRIAXone (ROCEPHIN)  IV Stopped (10/21/17 1845)     LOS: 9  days      Georgette Shell, MD [facility]  If 7PM-7AM, please contact night-coverage [email or web address] Password [email or web address] 10/22/2017, 12:19 PM

## 2017-10-22 NOTE — Progress Notes (Signed)
Pharmacy Antibiotic Note  Melanie Trevino is a 83 y.o. female admitted on 10/13/2017 with aspiration PNA.  Pharmacy has been consulted for meropenem dosing.  Plan: Meropenem 1 gr IV q8h   Monitor clinical course, renal function, cultures as available   Height: 5\' 4"  (162.6 cm) Weight: 196 lb 14.4 oz (89.3 kg) IBW/kg (Calculated) : 54.7  Temp (24hrs), Avg:98.4 F (36.9 C), Min:98.4 F (36.9 C), Max:98.4 F (36.9 C)  Recent Labs  Lab 10/16/17 0607 10/17/17 0634 10/18/17 0556 10/19/17 0531 10/20/17 0527 10/21/17 0616 10/22/17 0528  WBC 4.6 5.9 5.9 6.2 4.9  --   --   CREATININE 0.82 0.76 0.74 0.90 0.74 0.98 0.83    Estimated Creatinine Clearance: 51.6 mL/min (by C-G formula based on SCr of 0.83 mg/dL).    Allergies  Allergen Reactions  . Shellfish Allergy Anaphylaxis  . Other Other (See Comments)    HORSE SERUMYDrug[Other] swelling6/09/2006 12:00:00 AM by Erin Hearing CPhT  . Amoxicillin Swelling and Rash    Has patient had a PCN reaction causing immediate rash, facial/tongue/throat swelling, SOB or lightheadedness with hypotension: UNKNOWN Has patient had a PCN reaction causing severe rash involving mucus membranes or skin necrosis: Unknown Has patient had a PCN reaction that required hospitalization: Unknown Has patient had a PCN reaction occurring within the last 10 years: Unknown If all of the above answers are "NO", then may proceed with Cephalosporin use.     Antimicrobials this admission: 12/11 CTX/azith x1 12/12 lvq: 12/16 12/16 ceftriaxone + flagyl (for asp PNA, PCN allergy)>> 12/20 12/20 meropenem >>   Dose adjustments this admission: ----  Microbiology results: 12/11 BCx x2: NGF 12/12 Pleural fluid cx: neg FINAL  Thank you for allowing pharmacy to be a part of this patient's care.   Royetta Asal, PharmD, BCPS Pager [phone] 10/22/2017 2:46 PM

## 2017-10-22 NOTE — Progress Notes (Signed)
Date: October 22, 2017 Velva Harman, BSN, [place], [place] Chart and notes review for patient progress and needs. Will follow for case management and discharge needs. Next review date: [id number]

## 2017-10-23 LAB — BASIC METABOLIC PANEL
ANION GAP: 6 (ref 5–15)
BUN: 24 mg/dL — ABNORMAL HIGH (ref 6–20)
CALCIUM: 7.9 mg/dL — AB (ref 8.9–10.3)
CO2: 29 mmol/L (ref 22–32)
CREATININE: 0.69 mg/dL (ref 0.44–1.00)
Chloride: 106 mmol/L (ref 101–111)
Glucose, Bld: 97 mg/dL (ref 65–99)
Potassium: 3.8 mmol/L (ref 3.5–5.1)
SODIUM: 141 mmol/L (ref 135–145)

## 2017-10-23 LAB — CBC
HCT: 31.3 % — ABNORMAL LOW (ref 36.0–46.0)
HEMOGLOBIN: 10 g/dL — AB (ref 12.0–15.0)
MCH: 31 pg (ref 26.0–34.0)
MCHC: 31.9 g/dL (ref 30.0–36.0)
MCV: 96.9 fL (ref 78.0–100.0)
PLATELETS: 181 10*3/uL (ref 150–400)
RBC: 3.23 MIL/uL — AB (ref 3.87–5.11)
RDW: 15.6 % — ABNORMAL HIGH (ref 11.5–15.5)
WBC: 4.1 10*3/uL (ref 4.0–10.5)

## 2017-10-23 MED ORDER — ALBUTEROL SULFATE (2.5 MG/3ML) 0.083% IN NEBU
2.5000 mg | INHALATION_SOLUTION | Freq: Four times a day (QID) | RESPIRATORY_TRACT | Status: DC | PRN
  Administered 2017-10-25: 2.5 mg via RESPIRATORY_TRACT
  Filled 2017-10-23: qty 3

## 2017-10-23 MED ORDER — ALBUTEROL SULFATE (2.5 MG/3ML) 0.083% IN NEBU
INHALATION_SOLUTION | RESPIRATORY_TRACT | Status: AC
  Administered 2017-10-23: 2.5 mg
  Filled 2017-10-23: qty 3

## 2017-10-23 MED ORDER — IPRATROPIUM-ALBUTEROL 0.5-2.5 (3) MG/3ML IN SOLN
3.0000 mL | Freq: Three times a day (TID) | RESPIRATORY_TRACT | Status: DC
  Administered 2017-10-23 – 2017-10-31 (×26): 3 mL via RESPIRATORY_TRACT
  Filled 2017-10-23 (×27): qty 3

## 2017-10-23 NOTE — Progress Notes (Signed)
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 Occult stool was negative. She was transfused 2 units of blood and underwent a thoracentesis on 12/12 with removal of 1.8 L of dark fluid. Cytology was negative for malignancy. Fluid was exudative, culture negative.She was started on levofloxacin and diuresed but her effusion worsened. Modified barium swallow demonstrated no evidence of aspiration but CT chest demonstrated bilateralpneumonia, with fluid in the airways suggesting aspiration. Her antibiotics were changed to cover aspiration pneumonia on 12/16. Pulmonology and palliative care were consulted  12/20-patient continues to cough and oxygen depenedent.patient refused palliative care services.care giver reports patient vomitted breakfast. 12/21-care giver reports upon turning the patient oxygen sats drops.   Assessment & Plan:   Principal Problem:   Pleural effusion Active Problems:   Breast cancer (HCC)   Dementia of the Alzheimer's type   Gastrointestinal stromal tumor (GIST) (HCC)   Wheezing   Pressure injury of skin   Acute hypoxic respiratory distress due to large right-sided exudative pleural effusion by LDH criteria, possibly due to aspiration pneumonia -  ceftriaxone + flagyl stopped 12/20.meropenem started 12/20 - Palliative care and pulmonology assistance appreciated.--await recommendations from pulmonary.follow up chest xary in am, Acute on chronic diastolic heart failure, BUN and creatinineimproving by holding lasix -Echocardiogram: Grade 1 diastolic dysfunction with elevated left and right heart filling pressures - Hold  lasix  Incidental finding on CT:Air within the biliary tree, likely contained. Patient does not exhibits signs or symptoms of necrotic bowel or other abdominal catastrophe.   Anemia due to chronic disease,occult stool negative. -Hemoglobin continues to improve.  - Transfused 2 unit PRBC on 12/11 Demand ischemia due to anemia and pleural effusion -Troponin first set is 0.67, subsequent troponin 0 0.19, 0.19, 0.15  - EKG is negative for any acute ST-T changes.  - No regional wall motion abnormalities on ECHO  Paroxysmal atrial fibrillation, not on anticoagulation due to her history of thrombocytopenia and deconditioning/falls risk. Essential hypertension, now with hypotension, holding diuretics. [place].  Urinary incontinence,previously with chronic indwelling foley but foley would pop out frequently because she has a large urethra. -family contacted urologist who will dw dil.     DVT prophylaxis: lovenox Code Statusfull :Family Communication:dw son Disposition Plan: tbd Consultants: pccm,ir  Procedures:thoracentesis 12/12 Antimicrobials:meropenem Subjective:sleeping..   Objective:resting in nad Vitals:   10/23/17 0551 10/23/17 0750 10/23/17 1329 10/23/17 1522  BP: 136/73  (!) 126/58   Pulse: 65  64   Resp: 18  16   Temp: 98.7 F (37.1 C)  97.6 F (36.4 C)   TempSrc: Oral  Oral   SpO2: 97% 95% 100% 95%  Weight: 94.8 kg (209 lb 1.6 oz)     Height:        Intake/Output Summary (Last 24 hours) at 10/23/2017 1538 Last data filed at 10/23/2017 0552 Gross per 24 hour  Intake 220 ml  Output 700 ml  Net -480 ml   Filed Weights   10/21/17 1327 10/22/17 0500 10/23/17 0551  Weight: 98.1 kg (216 lb 4.8 oz) 89.3 kg (196 lb 14.4 oz) 94.8 kg (209 lb 1.6 oz)    Examination:  General exam: Appears calm and comfortable  Respiratory system: Clear to auscultation. Respiratory effort normal.  Cardiovascular system: S1 & S2 heard, RRR. No JVD,  murmurs, rubs, gallops or clicks. No pedal edema. Gastrointestinal system: Abdomen is nondistended, soft and nontender. No organomegaly or masses felt. Normal bowel sounds heard. Central nervous system: Alert and oriented. No focal neurological deficits. Extremities: Symmetric 5 x 5 power. Skin: No rashes, lesions or ulcers Psychiatry: Judgement and insight appear normal. Mood & affect appropriate.     Data Reviewed: I have personally reviewed following labs and imaging studies  CBC: Recent Labs  Lab 10/17/17 0634 10/18/17 0556 10/19/17 0531 10/20/17 0527 10/23/17 0613  WBC 5.9 5.9 6.2 4.9 4.1  HGB 9.4* 10.0* 10.4* 10.4* 10.0*  HCT 29.3* 30.2* 32.2* 32.6* 31.3*  MCV 97.0 96.5 96.7 96.4 96.9  PLT 162 160 153 158 381   Basic Metabolic Panel: Recent Labs  Lab 10/17/17 0634  10/19/17 0531 10/20/17 0527 10/21/17 0616 10/22/17 0528 10/23/17 0613  NA 139   < > 141 141 142 144 141  K 3.1*   < > 4.1 3.7 3.3* 3.7 3.8  CL 101   < > 106 107 106 110 106  CO2 30   < > 29 28 29 30 29   GLUCOSE 104*   < > 116* 102* 98 97 97  BUN 20   < > 29* 31* 34* 28* 24*  CREATININE 0.76   < > 0.90 0.74 0.98 0.83 0.69  CALCIUM 7.9*   < > 7.9* 8.0* 7.8* 8.0* 7.9*  MG 1.7  --   --   --   --   --   --    < > = values in this interval not displayed.   GFR: Estimated Creatinine Clearance: 55.3 mL/min (by C-G formula based on SCr of 0.69 mg/dL). Liver Function Tests: No results for input(s): AST, ALT, ALKPHOS, BILITOT, PROT, ALBUMIN in the last 168 hours. No results for input(s): LIPASE, AMYLASE in the last 168 hours. No results for input(s): AMMONIA in the last 168 hours. Coagulation Profile: No results for input(s): INR, PROTIME in the last 168 hours. Cardiac Enzymes: No results for input(s): CKTOTAL, CKMB, CKMBINDEX, TROPONINI in the last 168 hours. BNP (last 3 results) No results for input(s): PROBNP in the last 8760 hours. HbA1C: No results for input(s): HGBA1C in the last 72  hours. CBG: Recent Labs  Lab 10/17/17 0736  GLUCAP 107*   Lipid Profile: No results for input(s): CHOL, HDL, LDLCALC, TRIG, CHOLHDL, LDLDIRECT in the last 72 hours. Thyroid Function Tests: No results for input(s): TSH, T4TOTAL, FREET4, T3FREE, THYROIDAB in the last 72 hours. Anemia Panel: No results for input(s): VITAMINB12, FOLATE, FERRITIN, TIBC, IRON, RETICCTPCT in the last 72 hours. Sepsis Labs: No results for input(s): PROCALCITON, LATICACIDVEN in the last 168 hours.  Recent Results (from the past 240 hour(s))  Culture, blood (routine x 2)     Status: None   Collection Time: 10/13/17  8:03 PM  Result Value Ref Range Status   Specimen Description BLOOD LEFT ANTECUBITAL  Final   Special Requests   Final    BOTTLES DRAWN AEROBIC AND ANAEROBIC Blood Culture adequate volume   Culture   Final    NO GROWTH 5 DAYS Performed at [facility], [address] [place], [place] [address]    Report Status 10/19/2017 FINAL  Final  Culture, blood (routine x 2)     Status: None   Collection Time: 10/13/17  9:27 PM  Result Value Ref Range Status   Specimen Description BLOOD LEFT ARM  Final   Special Requests   Final    BOTTLES DRAWN AEROBIC AND ANAEROBIC Blood Culture adequate volume   Culture   Final    NO GROWTH 5 DAYS Performed at [facility], [address] [place], [place] [address]    Report Status 10/19/2017 FINAL  Final  Body fluid culture     Status: None   Collection Time: 10/14/17  2:47 PM  Result Value Ref Range Status   Specimen Description PLEURAL  Final   Special Requests RIGHT  Final   Gram Stain   Final    FEW WBC PRESENT, PREDOMINANTLY MONONUCLEAR NO ORGANISMS SEEN    Culture   Final    No growth aerobically or anaerobically. Performed at [facility], [place] [address] [place], [place] [address]    Report Status 10/18/2017 FINAL  Final  Culture, Urine     Status: None   Collection Time: 10/20/17  5:30 PM  Result Value Ref Range Status    Specimen Description URINE, CLEAN CATCH  Final   Special Requests NONE  Final   Culture   Final    NO GROWTH Performed at [facility], [place] [address] [place], [place] [address]    Report Status 10/22/2017 FINAL  Final         Radiology Studies: Dg Chest 1 View  Result Date: 10/22/2017 CLINICAL DATA:  Cough.  Pleural effusions. EXAM: CHEST 1 VIEW COMPARISON:  Chest x-rays dated 10/19/2017 and 10/18/2017 and CT scan dated 10/18/2017 FINDINGS: The right pleural effusion appears more prominent although this may be positional. Small left effusion process. Increased consolidation at the left lung base as indicated by the loss of air bronchograms. Pulmonary vascularity is normal. Heart size is unchanged in appears slightly prominent. Aortic atherosclerosis. No acute bone abnormality. Fairly severe arthritic changes of both shoulders. Surgical clips to the right of the trachea just above the thoracic inlet consistent with previous thyroid surgery. IMPRESSION: 1. Right pleural effusion appears slightly more prominent. 2. Increased consolidation at the left lung base. 3. Persistent small left effusion. 4. Aortic atherosclerosis. Electronically Signed   By: Lorriane Shire M.D.   On: 10/22/2017 10:59   Dg Abd 1 View  Result Date: 10/22/2017 CLINICAL DATA:  Nausea and vomiting. EXAM: ABDOMEN - 1 VIEW COMPARISON:  CT scan of the chest dated 10/18/2017 FINDINGS: There is contrast in nondistended loops of large and small bowel. No visible free air in the abdomen. Air is seen in the biliary tree. Bilateral pleural effusions, right greater than left. No acute bone abnormality. Severe arthritis of the right hip. Left total hip prosthesis. IMPRESSION: No acute abnormality of the abdomen. No evidence of bowel obstruction. Air in the biliary tree as previously described on the chest CT scan of 10/18/2017. Electronically Signed   By: Lorriane Shire M.D.   On: 10/22/2017 10:55        Scheduled Meds: .  docusate sodium  100 mg Oral BID  . donepezil  10 mg Oral QHS  . enoxaparin (LOVENOX) injection  40 mg Subcutaneous Q24H  . fluticasone  1 spray Each Nare Daily  . ipratropium-albuterol  3 mL Nebulization TID  . mirabegron ER  50 mg Oral QHS  . vitamin B-12  1,000 mcg Oral BID   Continuous Infusions: . meropenem (MERREM) IV 1 g (10/23/17 1040)     LOS: 10 days       Georgette Shell, MD [facility]  If 7PM-7AM,  please contact night-coverage [email or web address] Password [facility] 10/23/2017, 3:38 PM

## 2017-10-24 ENCOUNTER — Inpatient Hospital Stay (HOSPITAL_COMMUNITY): Payer: Medicare Other

## 2017-10-24 LAB — BASIC METABOLIC PANEL
ANION GAP: 6 (ref 5–15)
BUN: 19 mg/dL (ref 6–20)
CO2: 28 mmol/L (ref 22–32)
Calcium: 8.2 mg/dL — ABNORMAL LOW (ref 8.9–10.3)
Chloride: 108 mmol/L (ref 101–111)
Creatinine, Ser: 0.59 mg/dL (ref 0.44–1.00)
GFR calc Af Amer: >60 mL/min (ref >60)
Glucose, Bld: 93 mg/dL (ref 65–99)
POTASSIUM: 4.1 mmol/L (ref 3.5–5.1)
SODIUM: 142 mmol/L (ref 135–145)

## 2017-10-24 MED ORDER — FUROSEMIDE 10 MG/ML IJ SOLN
20.0000 mg | Freq: Every day | INTRAMUSCULAR | Status: DC
Start: 2017-10-24 — End: 2017-10-25
  Administered 2017-10-24 – 2017-10-25 (×2): 20 mg via INTRAVENOUS
  Filled 2017-10-24 (×2): qty 2

## 2017-10-24 NOTE — Progress Notes (Signed)
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 Occult stool was negative. She was transfused 2 units of blood and underwent a thoracentesis on 12/12 with removal of 1.8 L of dark fluid. Cytology was negative for malignancy. Fluid was exudative, culture negative.She was started on levofloxacin and diuresed but her effusion worsened. Modified barium swallow demonstrated no evidence of aspiration but CT chest demonstrated bilateralpneumonia, with fluid in the airways suggesting aspiration. Her antibiotics were changed to cover aspiration pneumonia on 12/16. Pulmonology and palliative care were consulted  12/20-patient continues to cough and oxygen depenedent.patient refused palliative care services.care giver reports patient vomitted breakfast. 12/21-care giver reports upon turning the patient oxygen sats drops.  12/22-patient appears to be short of breath.  And she does complain of shortness of breath.   Assessment & Plan:   Principal Problem:   Pleural effusion Active Problems:   Breast cancer (HCC)   Dementia of the Alzheimer's type   Gastrointestinal stromal tumor (GIST) (HCC)   Wheezing   Pressure injury of skin  Acute hypoxic respiratory distress due to large right-sided exudative pleural effusion by LDH criteria, possibly due to aspiration pneumonia -  ceftriaxone + flagyl stopped 12/20.meropenem started 12/20 - Palliative care and pulmonology assistance appreciated.chest x-ray done today showsStable left basilar atelectasis or infiltrate is noted with mild left pleural effusion. Stable moderate-sized probable loculated right pleural effusion is noted  with associated atelectasis adjacent right lower lobe.  Patient is awake alert and oriented.  I have discussed with the patient today that she probably want to make or have much improvement.  She refuses to have thoracentesis.  However she wants everything done to keep her alive.  I have told her that being on life support will not improve her quality of life for extend her life in any way.  She got very upset with me.  I have discussed this with Dr. Lamonte Sakai who agrees with the current management of continuing antibiotics using nebulizers and incentive spirometry.  Loculated effusion on the left side of her chest.  She wants to be a full code.  Her family would like her to be comfort care or palliative care.  However patient would like to be full code.  Patient has a daughter-in-law who is a Marine scientist who is the main spokesperson for the family.  Patient has a history of heart failure her Lasix was on hold due to increased creatinine however I will start her on a small dose of Lasix today and monitor renal functions hoping that it might improve some pleural effusion.   acute on chronic diastolic heart failure restart Lasix at a smaller dose and follow-up with the renal functions.  Incidental finding on CT:Air within the biliary tree, likely contained. Patient does not exhibits signs or symptoms of necrotic bowel or other abdominal catastrophe.   Anemia due to chronic disease,occult stool negative. -Hemoglobin continues to improve.  - Transfused 2 unit PRBC on 12/11 Demand ischemia due to anemia and pleural effusion -Troponin first set is 0.67, subsequent troponin 0 0.19, 0.19, 0.15  - EKG is negative for any acute ST-T changes.  - No regional wall motion abnormalities on ECHO  Paroxysmal atrial fibrillation, not on anticoagulation due to her  history of thrombocytopenia and deconditioning/falls risk. Essential hypertension, now with hypotension, holding diuretics.  [place].     DVT prophylaxis: LOVENOX Code Status:FULL Family Communication:NO FAMILY AVAILABLE Disposition Plan:TBD  Consultants: PCCM,PALLIATIVE  Procedures: NONE Antimicrobials: MEROPENEM Subjective: Complaining of shortness of breath and cough  Objective: Appears to be in short of breath stat chest x-ray has been ordered. Vitals:   10/23/17 1522 10/23/17 2018 10/23/17 2053 10/24/17 0536  BP:  (!) 147/74  (!) 158/71  Pulse:  (!) 58  66  Resp:  17  15  Temp:  98.1 F (36.7 C)  97.9 F (36.6 C)  TempSrc:  Oral  Oral  SpO2: 95% 99% 97% 98%  Weight:    94.1 kg (207 lb 8 oz)  Height:        Intake/Output Summary (Last 24 hours) at 10/24/2017 1234 Last data filed at 10/24/2017 0537 Gross per 24 hour  Intake 400 ml  Output 650 ml  Net -250 ml   Filed Weights   10/22/17 0500 10/23/17 0551 10/24/17 0536  Weight: 89.3 kg (196 lb 14.4 oz) 94.8 kg (209 lb 1.6 oz) 94.1 kg (207 lb 8 oz)    Examination:  General exam: Appears calm and comfortable  Respiratory system:DECREASED BREATH SOUNDS AT THE BASESauscultation. Respiratory effort normal. Cardiovascular system: S1 & S2 heard, RRR. No JVD, murmurs, rubs, gallops or clicks. No pedal edema. Gastrointestinal system: Abdomen is nondistended, soft and nontender. No organomegaly or masses felt. Normal bowel sounds heard. Central nervous system: Alert and oriented. No focal neurological deficits. Extremities: Symmetric 5 x 5 power. Skin: No rashes, lesions or ulcers Psychiatry: Judgement and insight appear normal. Mood & affect appropriate.     Data Reviewed: I have personally reviewed following labs and imaging studies  CBC: Recent Labs  Lab 10/18/17 0556 10/19/17 0531 10/20/17 0527 10/23/17 0613  WBC 5.9 6.2 4.9 4.1  HGB 10.0* 10.4* 10.4* 10.0*  HCT 30.2* 32.2* 32.6* 31.3*  MCV 96.5 96.7 96.4 96.9  PLT 160 153 158 778   Basic Metabolic Panel: Recent Labs  Lab 10/20/17 0527 10/21/17 0616  10/22/17 0528 10/23/17 0613 10/24/17 0613  NA 141 142 144 141 142  K 3.7 3.3* 3.7 3.8 4.1  CL 107 106 110 106 108  CO2 28 29 30 29 28   GLUCOSE 102* 98 97 97 93  BUN 31* 34* 28* 24* 19  CREATININE 0.74 0.98 0.83 0.69 0.59  CALCIUM 8.0* 7.8* 8.0* 7.9* 8.2*   GFR: Estimated Creatinine Clearance: 55.1 mL/min (by C-G formula based on SCr of 0.59 mg/dL). Liver Function Tests: No results for input(s): AST, ALT, ALKPHOS, BILITOT, PROT, ALBUMIN in the last 168 hours. No results for input(s): LIPASE, AMYLASE in the last 168 hours. No results for input(s): AMMONIA in the last 168 hours. Coagulation Profile: No results for input(s): INR, PROTIME in the last 168 hours. Cardiac Enzymes: No results for input(s): CKTOTAL, CKMB, CKMBINDEX, TROPONINI in the last 168 hours. BNP (last 3 results) No results for input(s): PROBNP in the last 8760 hours. HbA1C: No results for input(s): HGBA1C in the last 72 hours. CBG: No results for input(s): GLUCAP in the last 168 hours. Lipid Profile: No results for input(s): CHOL, HDL, LDLCALC, TRIG, CHOLHDL, LDLDIRECT in the last 72 hours. Thyroid Function Tests: No results for input(s): TSH, T4TOTAL, FREET4, T3FREE, THYROIDAB in the last 72 hours. Anemia Panel: No results for input(s): VITAMINB12, FOLATE, FERRITIN, TIBC, IRON, RETICCTPCT in the last 72 hours. Sepsis Labs: No results for input(s): PROCALCITON,  LATICACIDVEN in the last 168 hours.  Recent Results (from the past 240 hour(s))  Body fluid culture     Status: None   Collection Time: 10/14/17  2:47 PM  Result Value Ref Range Status   Specimen Description PLEURAL  Final   Special Requests RIGHT  Final   Gram Stain   Final    FEW WBC PRESENT, PREDOMINANTLY MONONUCLEAR NO ORGANISMS SEEN    Culture   Final    No growth aerobically or anaerobically. Performed at [facility], [place] [address], [place], [place] [address]    Report Status 10/18/2017 FINAL  Final  Culture, Urine     Status:  None   Collection Time: 10/20/17  5:30 PM  Result Value Ref Range Status   Specimen Description URINE, CLEAN CATCH  Final   Special Requests NONE  Final   Culture   Final    NO GROWTH Performed at [facility], [place] [address], [place], [place] [address]    Report Status 10/22/2017 FINAL  Final         Radiology Studies: Dg Chest 1 View  Result Date: 10/24/2017 CLINICAL DATA:  Hypoxia. EXAM: CHEST 1 VIEW COMPARISON:  Radiograph of October 22, 2017. FINDINGS: Stable cardiomegaly. Atherosclerosis of thoracic aorta is noted. No pneumothorax is noted. Stable left basilar atelectasis or infiltrate is noted with mild left pleural effusion. Stable moderate probable loculated right pleural effusion is noted, with associated atelectasis of the adjacent right lower lobe. Bony thorax is unremarkable. IMPRESSION: Stable left basilar atelectasis or infiltrate is noted with mild left pleural effusion. Stable moderate-sized probable loculated right pleural effusion is noted with associated atelectasis adjacent right lower lobe. Electronically Signed   By: Marijo Conception, M.D.   On: 10/24/2017 07:47        Scheduled Meds: . docusate sodium  100 mg Oral BID  . donepezil  10 mg Oral QHS  . enoxaparin (LOVENOX) injection  40 mg Subcutaneous Q24H  . fluticasone  1 spray Each Nare Daily  . ipratropium-albuterol  3 mL Nebulization TID  . mirabegron ER  50 mg Oral QHS  . vitamin B-12  1,000 mcg Oral BID   Continuous Infusions: . meropenem (MERREM) IV Stopped (10/24/17 1054)     LOS: 11 days      Georgette Shell, MD [facility]  If 7PM-7AM, please contact night-coverage [email or web address] Password [facility] 10/24/2017, 12:34 PM

## 2017-10-24 NOTE — Progress Notes (Signed)
Palliative care progress note Reason for consult: Goals of care in light of dementia, GIST, and current admission for pneumonia  I received a page that family was hoping to talk with me today.  Chart reviewed and discussed with bedside RN.  I met today briefly with Ms. Head (she is confused and does not understand complexities of her medical condition at this time).  I met outside the room with her 2 sons.  Her sons reports she had an episode of coughing, respiratory distress, and desaturation this morning.  Report that they "though that was going to be it."  They have been having a lot of conversation as a family since our last meeting and her sons report that on further discussion they do not think that Ms. Sudol would want continued escalation in aggressiveness of care if she were to understand her current condition (including consideration that she now has advanced dementia).  We further discussed that in light of multiple chronic medical problems that have worsened with this acute problem, her care should be focused on interventions that are likely to allow the patient to achieve goal of getting back to home and spending time with family. I discussed again with her sons regarding heroic interventions at the end-of-life and they agree this would not be likely to lead to getting well enough to go back home. They were in agreement with changing CODE STATUS to DO NOT RESUSCITATE.  We also discussed that there is high likelihood that she will continue to decline.  - Code status to DNR/DNI - Continue current therapies but no plan for escalation to ICU level care if continued decompensation. - Plan to meet again in 24-48 hours to reassess her situation.  Total time: 30 minutes Greater than 50%  of this time was spent counseling and coordinating care related to the above assessment and plan.  Micheline Rough, MD [place] Team [phone]

## 2017-10-25 LAB — BASIC METABOLIC PANEL
Anion gap: 5 (ref 5–15)
BUN: 20 mg/dL (ref 6–20)
CHLORIDE: 106 mmol/L (ref 101–111)
CO2: 32 mmol/L (ref 22–32)
CREATININE: 0.64 mg/dL (ref 0.44–1.00)
Calcium: 8.1 mg/dL — ABNORMAL LOW (ref 8.9–10.3)
GFR calc non Af Amer: >60 mL/min (ref >60)
Glucose, Bld: 96 mg/dL (ref 65–99)
POTASSIUM: 3.8 mmol/L (ref 3.5–5.1)
Sodium: 143 mmol/L (ref 135–145)

## 2017-10-25 LAB — CBC
HEMATOCRIT: 30.7 % — AB (ref 36.0–46.0)
HEMOGLOBIN: 9.7 g/dL — AB (ref 12.0–15.0)
MCH: 30.8 pg (ref 26.0–34.0)
MCHC: 31.6 g/dL (ref 30.0–36.0)
MCV: 97.5 fL (ref 78.0–100.0)
PLATELETS: 184 10*3/uL (ref 150–400)
RBC: 3.15 MIL/uL — AB (ref 3.87–5.11)
RDW: 15.8 % — ABNORMAL HIGH (ref 11.5–15.5)
WBC: 5.1 10*3/uL (ref 4.0–10.5)

## 2017-10-25 LAB — BRAIN NATRIURETIC PEPTIDE: B Natriuretic Peptide: 852.4 pg/mL — ABNORMAL HIGH (ref 0.0–100.0)

## 2017-10-25 MED ORDER — ONDANSETRON HCL 4 MG/2ML IJ SOLN
4.0000 mg | Freq: Four times a day (QID) | INTRAMUSCULAR | Status: DC | PRN
  Administered 2017-10-25: 4 mg via INTRAVENOUS
  Filled 2017-10-25: qty 2

## 2017-10-25 MED ORDER — FUROSEMIDE 10 MG/ML IJ SOLN
40.0000 mg | Freq: Two times a day (BID) | INTRAMUSCULAR | Status: DC
Start: 2017-10-25 — End: 2017-10-30
  Administered 2017-10-25 – 2017-10-30 (×10): 40 mg via INTRAVENOUS
  Filled 2017-10-25 (×10): qty 4

## 2017-10-25 MED ORDER — BENZONATATE 100 MG PO CAPS: 200.0000 mg | ORAL_CAPSULE | Freq: Three times a day (TID) | ORAL | Status: DC | PRN

## 2017-10-25 NOTE — Progress Notes (Signed)
Pharmacy Antibiotic Note  Melanie Trevino is a 83 y.o. female admitted on 10/13/2017 with aspiration PNA.  Pharmacy has been consulted for meropenem dosing.  Plan: Today is Day 8 of appropriate treatment for aspiration PNA - Rocephin/Flagyl and now Meropenem 1) Continue meropenem 1g IV q8 2) Monitor clinical course, renal function, cultures as available 3) Usual recommended length of therapy for treatment of aspiraton PNA is 7 days - recommend d/c of meropenem at this time   Height: 5\' 4"  (162.6 cm) Weight: 205 lb 12.8 oz (93.4 kg) IBW/kg (Calculated) : 54.7  Temp (24hrs), Avg:98.2 F (36.8 C), Min:98 F (36.7 C), Max:98.6 F (37 C)  Recent Labs  Lab 10/19/17 0531 10/20/17 0527 10/21/17 0616 10/22/17 0528 10/23/17 0613 10/24/17 0613 10/25/17 0559  WBC 6.2 4.9  --   --  4.1  --  5.1  CREATININE 0.90 0.74 0.98 0.83 0.69 0.59 0.64    Estimated Creatinine Clearance: 54.9 mL/min (by C-G formula based on SCr of 0.64 mg/dL).    Allergies  Allergen Reactions  . Shellfish Allergy Anaphylaxis  . Other Other (See Comments)    HORSE SERUMYDrug[Other] swelling6/09/2006 12:00:00 AM by Erin Hearing CPhT  . Amoxicillin Swelling and Rash    Has patient had a PCN reaction causing immediate rash, facial/tongue/throat swelling, SOB or lightheadedness with hypotension: UNKNOWN Has patient had a PCN reaction causing severe rash involving mucus membranes or skin necrosis: Unknown Has patient had a PCN reaction that required hospitalization: Unknown Has patient had a PCN reaction occurring within the last 10 years: Unknown If all of the above answers are "NO", then may proceed with Cephalosporin use.     Antimicrobials this admission: 12/11 CTX/azith x1 12/12 lvq: 12/16 12/16 ceftriaxone + flagyl (for asp PNA, PCN allergy)>> 12/20 12/20 meropenem >>   Dose adjustments this admission: ----  Microbiology results: 12/11 BCx x2: NGF 12/12 Pleural fluid cx: neg FINAL   Thank you for  allowing pharmacy to be a part of this patient's care.  Adrian Saran, PharmD, BCPS Pager [phone] 10/25/2017 9:15 AM

## 2017-10-25 NOTE — Progress Notes (Signed)
Palliative care progress note Reason for consult: Goals of care in light of dementia, GIST, and current admission for pneumonia  Chart reviewed and discussed with bedside RN.  I met today briefly with Melanie Trevino (she is confused and does not understand complexities of her medical condition at this time).  I met outside the room with her 2 sons and daughter in law.  Her sons have been discussing care plan moving forward and report that they understand that she is not making significant gains in her clinical status despite prolonged hospitalization.  We discussed that there is high likelihood that she will continue to decline regardless of interventions moving forward.  We explored her continued requests to "get better" and also her continued request to go home.  I discussed with them that it remains a difficult situation due to the fact that she has dementia and does not really possess understanding of her clinical condition.  Her sons are struggling with how to best support their mother and her wishes.  We did have a long discussion today about pathways forward including continuation of current therapies versus refocusing care on trying to get her home with the support of hospice as she continues to report that she wants to go home.  We did discuss that it is not beneficial to discuss dying or hospice in front of patient as she does not comprehend her current situation in such talk is upsetting to her.  We did discuss trying to focus on her ultimate goals of feeling better and being at her home and how hospice would be able to benefit them in this goal as it appears we are now trying to fix problems that are not fixable.  - Code status to DNR/DNI - Continue current therapies but no plan for escalation to ICU level care if continued decompensation.  We talked today about what it may look like to try and get her home with the support of hospice.  While she continues to state that she is not ready to  die, she also reports desperately wanting to be at home.  She does not really understand the severity of the situation and that she is likely to continue to decline regardless of interventions moving forward.  Her family thinks that if she was truly understood the situation, her goal would to be at home. - Plan to meet again at 1300 tomorrow.  Total time: 30 minutes Greater than 50%  of this time was spent counseling and coordinating care related to the above assessment and plan.  Micheline Rough, MD [place] Team [phone]

## 2017-10-25 NOTE — Progress Notes (Signed)
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 Occult stool was negative. She was transfused 2 units of blood and underwent a thoracentesis on 12/12 with removal of 1.8 L of dark fluid. Cytology was negative for malignancy. Fluid was exudative, culture negative.She was started on levofloxacin and diuresed but her effusion worsened. Modified barium swallow demonstrated no evidence of aspiration but CT chest demonstrated bilateralpneumonia, with fluid in the airways suggesting aspiration. Her antibiotics were changed to cover aspiration pneumonia on 12/16. Pulmonology and palliative care were consulted  12/20-patient continues to cough and oxygen depenedent.patient refused palliative care services.care giver reports patient vomitted breakfast. 12/21-care giver reports upon turning the patient oxygen sats drops.  12/22-patient appears to be short of breath.  And she does complain of shortness of breath.  10/25/17 - Still SOB. CXR reviewed. Palliative care input is appreciated. Will consider US guided thoracentesis. Will increase IV Lasix from 20mg  daily to 40mg  Bid   Assessment & Plan:   Principal Problem:   Pleural effusion Active Problems:   Breast cancer (HCC)   Dementia of the Alzheimer's type   Gastrointestinal stromal tumor (GIST) (HCC)   Wheezing   Pressure injury of skin  Acute hypoxic respiratory distress due to large right-sided exudative pleural effusion by LDH criteria, possibly due to aspiration pneumonia - Consult IR for repeat US guided thoracentesis if ok with family. -  ceftriaxone + flagyl stopped 12/20.meropenem started 12/20 - Palliative  care and pulmonology assistance appreciated.  - Right pleural effusion is noted.      acute on chronic diastolic heart failure. - Increase IV lasix to 40mg  Bid.  Incidental finding on CT:Air within the biliary tree, likely contained. Patient does not exhibits signs or symptoms of necrotic bowel or other abdominal catastrophe.   Anemia due to chronic disease - occult stool negative. -Hemoglobin is 9.7g/dl today.  - Transfused 2 unit PRBC on 12/11  Demand ischemia due to anemia and pleural effusion - No chest pain reported today (10/25/17) -Troponin first set is 0.67, subsequent troponin 0 0.19, 0.19, 0.15  - EKG is negative for any acute ST-T changes.  - No regional wall motion abnormalities on ECHO  Paroxysmal atrial fibrillation, not on anticoagulation due to deconditioning/falls risk.  Essential hypertension, now with hypotension, holding diuretics. [place].     DVT prophylaxis: LOVENOX Code Status:FULL Family Communication:NO FAMILY AVAILABLE Disposition Plan:TBD  Consultants: PCCM,PALLIATIVE  Procedures: NONE Antimicrobials: MEROPENEM Subjective: Patient continues to be short of breath.  Objective: Appears to be in short of breath stat chest x-ray has been ordered. Vitals:   10/25/17 0808 10/25/17 1155 10/25/17 1500 10/25/17 1554  BP:   132/65   Pulse:   67 63  Resp: (!) 22  20 (!) 22  Temp:  98.5 F (36.9 C) 98.1 F (36.7 C)   TempSrc:   Oral   SpO2: 97%  96% 94%  Weight:      Height:        Intake/Output Summary (Last 24 hours) at 10/25/2017 1634 Last data filed at 10/25/2017 1300 Gross per 24 hour  Intake 690 ml  Output 2250 ml  Net -1560 ml   Filed Weights   10/23/17 0551 10/24/17 0536 10/25/17 0512  Weight:  94.8 kg (209 lb 1.6 oz) 94.1 kg (207 lb 8 oz) 93.4 kg (205 lb 12.8 oz)    Examination:  General exam: Not in any obvious distress HEENT - JVD is equivocal. Respiratory system: Significantly decreased breath  sound right lung field.  Cardiovascular system: S1 & S2. Gastrointestinal system: Abdomen is nondistended, soft and nontender. Patient has ventral hernia. No organomegaly or masses felt. Normal bowel sounds heard. Central nervous system: Alert. No focal neurological deficits. Extremities: Edema with Dark colored lower legs said to be chronic as per patient's carer.    Data Reviewed: I have personally reviewed following labs and imaging studies  CBC: Recent Labs  Lab 10/19/17 0531 10/20/17 0527 10/23/17 0613 10/25/17 0559  WBC 6.2 4.9 4.1 5.1  HGB 10.4* 10.4* 10.0* 9.7*  HCT 32.2* 32.6* 31.3* 30.7*  MCV 96.7 96.4 96.9 97.5  PLT 153 158 181 892   Basic Metabolic Panel: Recent Labs  Lab 10/21/17 0616 10/22/17 0528 10/23/17 0613 10/24/17 0613 10/25/17 0559  NA 142 144 141 142 143  K 3.3* 3.7 3.8 4.1 3.8  CL 106 110 106 108 106  CO2 29 30 29 28  32  GLUCOSE 98 97 97 93 96  BUN 34* 28* 24* 19 20  CREATININE 0.98 0.83 0.69 0.59 0.64  CALCIUM 7.8* 8.0* 7.9* 8.2* 8.1*   GFR: Estimated Creatinine Clearance: 54.9 mL/min (by C-G formula based on SCr of 0.64 mg/dL). Liver Function Tests: No results for input(s): AST, ALT, ALKPHOS, BILITOT, PROT, ALBUMIN in the last 168 hours. No results for input(s): LIPASE, AMYLASE in the last 168 hours. No results for input(s): AMMONIA in the last 168 hours. Coagulation Profile: No results for input(s): INR, PROTIME in the last 168 hours. Cardiac Enzymes: No results for input(s): CKTOTAL, CKMB, CKMBINDEX, TROPONINI in the last 168 hours. BNP (last 3 results) No results for input(s): PROBNP in the last 8760 hours. HbA1C: No results for input(s): HGBA1C in the last 72 hours. CBG: No results for input(s): GLUCAP in the last 168 hours. Lipid Profile: No results for input(s): CHOL, HDL, LDLCALC, TRIG, CHOLHDL, LDLDIRECT in the last 72 hours. Thyroid Function Tests: No results for input(s): TSH, T4TOTAL, FREET4, T3FREE, THYROIDAB in the last  72 hours. Anemia Panel: No results for input(s): VITAMINB12, FOLATE, FERRITIN, TIBC, IRON, RETICCTPCT in the last 72 hours. Sepsis Labs: No results for input(s): PROCALCITON, LATICACIDVEN in the last 168 hours.  Recent Results (from the past 240 hour(s))  Culture, Urine     Status: None   Collection Time: 10/20/17  5:30 PM  Result Value Ref Range Status   Specimen Description URINE, CLEAN CATCH  Final   Special Requests NONE  Final   Culture   Final    NO GROWTH Performed at [facility], [address], [place], [place] [address]    Report Status 10/22/2017 FINAL  Final         Radiology Studies: Dg Chest 1 View  Result Date: 10/24/2017 CLINICAL DATA:  Hypoxia. EXAM: CHEST 1 VIEW COMPARISON:  Radiograph of October 22, 2017. FINDINGS: Stable cardiomegaly. Atherosclerosis of thoracic aorta is noted. No pneumothorax is noted. Stable left basilar atelectasis or infiltrate is noted with mild left pleural effusion. Stable moderate probable loculated right pleural effusion is noted, with associated atelectasis of the adjacent right lower lobe. Bony thorax is unremarkable. IMPRESSION: Stable left basilar atelectasis or infiltrate is noted with mild left pleural effusion. Stable moderate-sized probable loculated right pleural effusion is noted with associated atelectasis  adjacent right lower lobe. Electronically Signed   By: Marijo Conception, M.D.   On: 10/24/2017 07:47        Scheduled Meds: . docusate sodium  100 mg Oral BID  . donepezil  10 mg Oral QHS  . enoxaparin (LOVENOX) injection  40 mg Subcutaneous Q24H  . fluticasone  1 spray Each Nare Daily  . furosemide  40 mg Intravenous BID  . ipratropium-albuterol  3 mL Nebulization TID  . mirabegron ER  50 mg Oral QHS  . vitamin B-12  1,000 mcg Oral BID   Continuous Infusions: . meropenem (MERREM) IV Stopped (10/25/17 1116)     LOS: 12 days      Bonnell Public, MD [facility]  If 7PM-7AM, please  contact night-coverage [email or web address] Password [facility] 10/25/2017, 4:34 PM

## 2017-10-26 ENCOUNTER — Inpatient Hospital Stay (HOSPITAL_COMMUNITY): Payer: Medicare Other

## 2017-10-26 LAB — BASIC METABOLIC PANEL
Anion gap: 6 (ref 5–15)
BUN: 21 mg/dL — AB (ref 6–20)
CHLORIDE: 104 mmol/L (ref 101–111)
CO2: 31 mmol/L (ref 22–32)
Calcium: 7.8 mg/dL — ABNORMAL LOW (ref 8.9–10.3)
Creatinine, Ser: 0.62 mg/dL (ref 0.44–1.00)
GFR calc non Af Amer: >60 mL/min (ref >60)
Glucose, Bld: 97 mg/dL (ref 65–99)
POTASSIUM: 3.5 mmol/L (ref 3.5–5.1)
SODIUM: 141 mmol/L (ref 135–145)

## 2017-10-26 LAB — BODY FLUID CELL COUNT WITH DIFFERENTIAL
Lymphs, Fluid: 17 %
Monocyte-Macrophage-Serous Fluid: 9 % — ABNORMAL LOW (ref 50–90)
Neutrophil Count, Fluid: 74 % — ABNORMAL HIGH (ref 0–25)
Total Nucleated Cell Count, Fluid: 1684 cu mm — ABNORMAL HIGH (ref 0–1000)

## 2017-10-26 LAB — LACTATE DEHYDROGENASE, PLEURAL OR PERITONEAL FLUID: LD, Fluid: 195 U/L — ABNORMAL HIGH (ref 3–23)

## 2017-10-26 LAB — GLUCOSE, PLEURAL OR PERITONEAL FLUID: Glucose, Fluid: 114 mg/dL

## 2017-10-26 LAB — PROTEIN, PLEURAL OR PERITONEAL FLUID: Total protein, fluid: <3 g/dL

## 2017-10-26 LAB — ALBUMIN, PLEURAL OR PERITONEAL FLUID: Albumin, Fluid: 1.6 g/dL

## 2017-10-26 MED ORDER — LIDOCAINE HCL 1 % IJ SOLN
INTRAMUSCULAR | Status: AC
  Filled 2017-10-26: qty 10

## 2017-10-26 MED ORDER — BENZONATATE 100 MG PO CAPS: 200.0000 mg | ORAL_CAPSULE | Freq: Three times a day (TID) | ORAL | Status: DC | PRN

## 2017-10-26 NOTE — Progress Notes (Signed)
Date:  October 26, 2017 Chart reviewed for concurrent status and case management needs.  Will continue to follow patient progress.  Discharge Planning: following for needs  Expected discharge date: December 247 2018  Josefa Syracuse, BSN, RN3, CCM   [phone]  

## 2017-10-26 NOTE — Progress Notes (Signed)
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 Occult stool was negative. She was transfused 2 units of blood and underwent a thoracentesis on 12/12 with removal of 1.8 L of dark fluid. Cytology was negative for malignancy. Fluid was exudative, culture negative.She was started on levofloxacin and diuresed but her effusion worsened. Modified barium swallow demonstrated no evidence of aspiration but CT chest demonstrated bilateralpneumonia, with fluid in the airways suggesting aspiration. Her antibiotics were changed to cover aspiration pneumonia on 12/16. Pulmonology and palliative care were consulted  12/20-patient continues to cough and oxygen depenedent.patient refused palliative care services.care giver reports patient vomitted breakfast. 12/21-care giver reports upon turning the patient oxygen sats drops.  12/22-patient appears to be short of breath.  And she does complain of shortness of breath.  10/25/17 - Still SOB. CXR reviewed. Palliative care input is appreciated. Will consider US guided thoracentesis. Will increase IV Lasix from 20mg  daily to 40mg  Bid.  10/26/17 - Patient seen today alongside patient's caregiver. Patient underwent US guided thoracentesis. 2L of bloody fluid drained. Will follow fluid analysis. Patient looks a lot better post thoracentesis. Dose of Lasix was also doubled yesterday. SOB has improved significantly. Monitor respiratory status, renal function and electrolytes. Palliative care team is discussing goal of care with patient's family.   Assessment & Plan:   Principal Problem:   Pleural effusion Active Problems:   Breast cancer  (HCC)   Dementia of the Alzheimer's type   Gastrointestinal stromal tumor (GIST) (HCC)   Wheezing   Pressure injury of skin  Acute hypoxic respiratory distress due to large right-sided (Prior pleural fluid analysis is deemed exudative pleural effusion by LDH criteria, possibly due to aspiration pneumonia) - Patient underwent repeat thoracentesis today, and 2L of bloody pleural effusion taken out. - Follow pleural fluid analysis. - Continue antbiotics (ceftriaxone + flagyl stopped 12/20.meropenem started 12/20) - Palliative care and pulmonology assistance appreciated.     acute on chronic diastolic heart failure. - Increase IV lasix to 40mg  Bid.  Incidental finding on CT:Air within the biliary tree, likely contained. Patient does not exhibits signs or symptoms of necrotic bowel or other abdominal catastrophe.   Anemia due to chronic disease - occult stool negative.  - Transfused 2 unit PRBC on 12/11  Demand ischemia due to anemia and pleural effusion - No chest pain reported today (10/25/17) -Troponin first set is 0.67, subsequent troponin 0 0.19, 0.19, 0.15  - EKG is negative for any acute ST-T changes.  - No regional wall motion abnormalities on ECHO  Paroxysmal atrial fibrillation, not on anticoagulation due to deconditioning/falls risk.  Essential hypertension, now with hypotension, holding diuretics. [place].     DVT prophylaxis: LOVENOX Code Status:FULL Family Communication:NO FAMILY AVAILABLE Disposition Plan:TBD  Consultants: PCCM,PALLIATIVE  Procedures: NONE Antimicrobials: MEROPENEM  Subjective: Patient looks a lot better today. No SOB  Objective: Appears to be in short of breath stat chest x-ray has been ordered. Vitals:   10/26/17 0544 10/26/17 0859 10/26/17 1105 10/26/17 1116  BP: (!) 145/62  (!) 95/49 126/60  Pulse: 62 63    Resp: 18 18    Temp: 98.1 F (36.7 C)     TempSrc: Oral     SpO2: 98% 98%  Weight: 91.1 kg (200  lb 14.4 oz)     Height:        Intake/Output Summary (Last 24 hours) at 10/26/2017 1232 Last data filed at 10/26/2017 4315 Gross per 24 hour  Intake 460 ml  Output 225 ml  Net 235 ml   Filed Weights   10/24/17 0536 10/25/17 0512 10/26/17 0544  Weight: 94.1 kg (207 lb 8 oz) 93.4 kg (205 lb 12.8 oz) 91.1 kg (200 lb 14.4 oz)    Examination:  General exam: Not in any obvious distress HEENT - No JVD. Respiratory system: Significantly improved air entry.  Cardiovascular system: S1 & S2. Gastrointestinal system: Abdomen is nondistended, soft and nontender. Patient has ventral hernia. No organomegaly or masses felt. Normal bowel sounds heard. Central nervous system: Alert. No focal neurological deficits. Extremities: Edema with Dark colored lower legs said to be chronic as per patient's carer.    Data Reviewed: I have personally reviewed following labs and imaging studies  CBC: Recent Labs  Lab 10/20/17 0527 10/23/17 0613 10/25/17 0559  WBC 4.9 4.1 5.1  HGB 10.4* 10.0* 9.7*  HCT 32.6* 31.3* 30.7*  MCV 96.4 96.9 97.5  PLT 158 181 400   Basic Metabolic Panel: Recent Labs  Lab 10/22/17 0528 10/23/17 0613 10/24/17 0613 10/25/17 0559 10/26/17 0531  NA 144 141 142 143 141  K 3.7 3.8 4.1 3.8 3.5  CL 110 106 108 106 104  CO2 30 29 28  32 31  GLUCOSE 97 97 93 96 97  BUN 28* 24* 19 20 21*  CREATININE 0.83 0.69 0.59 0.64 0.62  CALCIUM 8.0* 7.9* 8.2* 8.1* 7.8*   GFR: Estimated Creatinine Clearance: 54.2 mL/min (by C-G formula based on SCr of 0.62 mg/dL). Liver Function Tests: No results for input(s): AST, ALT, ALKPHOS, BILITOT, PROT, ALBUMIN in the last 168 hours. No results for input(s): LIPASE, AMYLASE in the last 168 hours. No results for input(s): AMMONIA in the last 168 hours. Coagulation Profile: No results for input(s): INR, PROTIME in the last 168 hours. Cardiac Enzymes: No results for input(s): CKTOTAL, CKMB, CKMBINDEX, TROPONINI in the last 168 hours. BNP  (last 3 results) No results for input(s): PROBNP in the last 8760 hours. HbA1C: No results for input(s): HGBA1C in the last 72 hours. CBG: No results for input(s): GLUCAP in the last 168 hours. Lipid Profile: No results for input(s): CHOL, HDL, LDLCALC, TRIG, CHOLHDL, LDLDIRECT in the last 72 hours. Thyroid Function Tests: No results for input(s): TSH, T4TOTAL, FREET4, T3FREE, THYROIDAB in the last 72 hours. Anemia Panel: No results for input(s): VITAMINB12, FOLATE, FERRITIN, TIBC, IRON, RETICCTPCT in the last 72 hours. Sepsis Labs: No results for input(s): PROCALCITON, LATICACIDVEN in the last 168 hours.  Recent Results (from the past 240 hour(s))  Culture, Urine     Status: None   Collection Time: 10/20/17  5:30 PM  Result Value Ref Range Status   Specimen Description URINE, CLEAN CATCH  Final   Special Requests NONE  Final   Culture   Final    NO GROWTH Performed at [facility], [address] [place], [place] [address]    Report Status 10/22/2017 FINAL  Final         Radiology Studies: Dg Chest 1 View  Result Date: 10/26/2017 CLINICAL DATA:  Status post RIGHT thoracentesis EXAM: CHEST 1 VIEW COMPARISON:  10/24/2017 FINDINGS: reduction in RIGHT pleural fluid following thoracentesis. No pneumothorax identified. Persistent small bilateral pleural effusions and atelectasis. Severe degenerate change in the  RIGHT shoulder joint. IMPRESSION: 1. No apparent pneumothorax following RIGHT thoracentesis. 2. Reduction in RIGHT pleural fluid volume. Electronically Signed   By: Suzy Bouchard M.D.   On: 10/26/2017 11:36   US Thoracentesis Asp Pleural Space W/img Guide  Result Date: 10/26/2017 INDICATION: Patient with history of breast cancer, gastrointestinal stromal tumor, dyspnea, recurrent right pleural effusion. Request made for diagnostic and therapeutic right thoracentesis. EXAM: ULTRASOUND GUIDED DIAGNOSTIC AND THERAPEUTIC RIGHT THORACENTESIS MEDICATIONS: None.  COMPLICATIONS: None immediate. PROCEDURE: An ultrasound guided thoracentesis was thoroughly discussed with the patient's daughter (patient with dementia) and questions answered. The benefits, risks, alternatives and complications were also discussed. The patient's daughter understands and wishes to proceed with the procedure. Written consent was obtained. Ultrasound was performed to localize and mark an adequate pocket of fluid in the right chest. The area was then prepped and draped in the normal sterile fashion. 1% Lidocaine was used for local anesthesia. Under ultrasound guidance a Safe-T-Centesis catheter was introduced. Thoracentesis was performed. The catheter was removed and a dressing applied. FINDINGS: A total of approximately 2 liters of bloody fluid was removed. Samples were sent to the laboratory as requested by the clinical team. IMPRESSION: Successful ultrasound guided diagnostic and therapeutic right thoracentesis yielding 2 liters of pleural fluid. Postprocedure chest x-ray revealed no pneumothorax. Read by: Rowe Robert, PA-C Electronically Signed   By: Jerilynn Mages.  Shick M.D.   On: 10/26/2017 12:02        Scheduled Meds: . docusate sodium  100 mg Oral BID  . donepezil  10 mg Oral QHS  . enoxaparin (LOVENOX) injection  40 mg Subcutaneous Q24H  . fluticasone  1 spray Each Nare Daily  . furosemide  40 mg Intravenous BID  . ipratropium-albuterol  3 mL Nebulization TID  . lidocaine      . mirabegron ER  50 mg Oral QHS  . vitamin B-12  1,000 mcg Oral BID   Continuous Infusions: . meropenem (MERREM) IV Stopped (10/26/17 1000)     LOS: 13 days      Bonnell Public, MD [facility]  If 7PM-7AM, please contact night-coverage [email or web address] Password [facility] 10/26/2017, 12:32 PM

## 2017-10-26 NOTE — Procedures (Signed)
Ultrasound-guided diagnostic and therapeutic right thoracentesis performed yielding 2 liters of bloody fluid. No immediate complications. Follow-up chest x-ray pending.A portion of the fluid was sent to the lab for preordered studies.

## 2017-10-27 DIAGNOSIS — R0902 Hypoxemia: Secondary | ICD-10-CM

## 2017-10-27 LAB — HEMOGLOBIN AND HEMATOCRIT, BLOOD
HEMATOCRIT: 31 % — AB (ref 36.0–46.0)
HEMOGLOBIN: 9.9 g/dL — AB (ref 12.0–15.0)

## 2017-10-27 LAB — BASIC METABOLIC PANEL
ANION GAP: 5 (ref 5–15)
BUN: 19 mg/dL (ref 6–20)
CALCIUM: 7.7 mg/dL — AB (ref 8.9–10.3)
CO2: 36 mmol/L — ABNORMAL HIGH (ref 22–32)
Chloride: 101 mmol/L (ref 101–111)
Creatinine, Ser: 0.62 mg/dL (ref 0.44–1.00)
GLUCOSE: 95 mg/dL (ref 65–99)
POTASSIUM: 3.3 mmol/L — AB (ref 3.5–5.1)
Sodium: 142 mmol/L (ref 135–145)

## 2017-10-27 LAB — ACID FAST SMEAR (AFB, MYCOBACTERIA): Acid Fast Smear: NEGATIVE

## 2017-10-27 NOTE — Progress Notes (Signed)
Palliative care progress note Reason for consult: Goals of care in light of dementia, GIST, and current admission for pneumonia  Chart reviewed and discussed with bedside RN.  I met today briefly with Ms. Riegler (she is confused and does not understand complexities of her medical condition at this time).  I met outside the room with her 2 sons and her 2 daughters in law.  Her sons have been discussing care plan moving forward and report that they understand that she is not making significant gains in her clinical status despite prolonged hospitalization.  Her family continues to struggle with how to best support their mother and her wishes.  We had another long discussion today about pathways forward including discussion again about trying to get her home with the support of hospice as she continues to report that she wants to go home.  We did discuss that it is not beneficial to discuss dying or hospice in front of patient as she does not comprehend her current situation in such talk is upsetting to her.  We did discuss trying to focus on her ultimate goals of feeling better and being at her home and how hospice would be able to benefit them in this goal as it appears we are now trying to fix problems that are not fixable.  - Code status to DNR/DNI - Continue current therapies but no plan for escalation to ICU level care if continued decompensation.   -We talked today about what it may look like to try and get her home with the support of hospice.  While she continues to state that she is not ready to die, she also reports desperately wanting to be at home.  -Family request social work to rerun options for rehab as well as long-term private pay care to see if any changes in availability may be acceptable to them. -Family would like to meet with liaison for hospice and palliative care of [place] to discuss options for hospice on discharge.  Most likely discharge will be home with hospice support.   Case management referral placed. -Ideally, would watch for another couple of days as now status post thoracentesis for 2 L on Monday.  Goal would be to see how quickly she re-accumulates as she may benefit from pigtail catheter prior to discharge home if she is going to go with hospice support. -While I am going off service, I will ask another member palliative medicine team to follow-up with family in the next couple of days  Total time: 40 minutes Greater than 50%  of this time was spent counseling and coordinating care related to the above assessment and plan.   , MD  Palliative Medicine Team [phone] 

## 2017-10-27 NOTE — Progress Notes (Signed)
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 Occult stool was negative. She was transfused 2 units of blood and underwent a thoracentesis on 12/12 with removal of 1.8 L of dark fluid. Cytology was negative for malignancy. Fluid was exudative, culture negative.She was started on levofloxacin and diuresed but her effusion worsened. Modified barium swallow demonstrated no evidence of aspiration but CT chest demonstrated bilateralpneumonia, with fluid in the airways suggesting aspiration. Her antibiotics were changed to cover aspiration pneumonia on 12/16. Pulmonology and palliative care were consulted  12/25 pt seen with caregiver.  Pt denies any new complaints. She appears comfortable on 2 lit of [place] oxygen.  Palliative care on board and family discussions going on.    Assessment & Plan:   Principal Problem:   Pleural effusion Active Problems:   Breast cancer (HCC)   Dementia of the Alzheimer's type   Gastrointestinal stromal tumor (GIST) (HCC)   Wheezing   Pressure injury of skin   Acute respiratory failure with hypoxia secondary to large right sided pleural effusion, possibly para pneumonic effusion due to aspiration pneumonia  - thoracentesis done and 2 lit of pleural fluid drained.  - follow up pleural fluids.  Resume IV antibiotics.  Palliative care consulted and recommendations given.    Acute on chronic diastolic heart failure On IV lasix BID.   Incidental finding on CT:Air within the biliary tree, likely contained. Patient does not exhibits signs or symptoms of necrotic bowel or other abdominal catastrophe.   Anemia of chronic  disease:  Stool for occult negative.  S/p 2 unit of prbc transfusion and repeat H&H in am.   PAF: Not on anti coagulation sec to deconditioning.    Hypertension:  Controlled.    Dementia:  Resume aricept.    DVT prophylaxis: lovenox.  Code Status: DNR.  Family Communication: caregiver at bedside.  Disposition Plan: pending palliative consult.   Consultants:   Palliative.   Pulmonary consult.    Procedures: thoracentesis.    Antimicrobials:meropenam. To complete the course the pneumonia.    Subjective: No complaints.   Objective: Vitals:   10/27/17 0805 10/27/17 1244 10/27/17 1410 10/27/17 1413  BP:   105/61   Pulse:   (!) 112   Resp:   20   Temp:   98.3 F (36.8 C)   TempSrc:   Oral   SpO2: 96%  98% 98%  Weight:  94.1 kg (207 lb 8 oz)    Height:        Intake/Output Summary (Last 24 hours) at 10/27/2017 1548 Last data filed at 10/27/2017 0810 Gross per 24 hour  Intake 560 ml  Output 1900 ml  Net -1340 ml   Filed Weights   10/25/17 0512 10/26/17 0544 10/27/17 1244  Weight: 93.4 kg (205 lb 12.8 oz) 91.1 kg (200 lb 14.4 oz) 94.1 kg (207 lb 8 oz)    Examination:  General exam: Appears calm and comfortable on 2 lit of [place] oxygen.  Respiratory system: diminished at bases.  Cardiovascular system: S1 & S2 heard, RRR. No JVD, murmurs, rubs, gallops or clicks. No pedal edema. Gastrointestinal system: Abdomen is nondistended, soft and nontender. No organomegaly or masses felt. Normal bowel sounds heard.  Central nervous system: Alert and oriented. Extremities: Symmetric 5 x 5 power. Pedal and leg edema, improving.  Skin: No rashes, lesions or ulcers Psychiatry:  Mood & affect appropriate.     Data Reviewed: I have personally reviewed following labs and imaging studies  CBC: Recent Labs  Lab 10/23/17 0613 10/25/17 0559  WBC 4.1 5.1  HGB 10.0* 9.7*  HCT 31.3* 30.7*  MCV 96.9 97.5  PLT 181 423   Basic Metabolic Panel: Recent Labs  Lab  10/23/17 0613 10/24/17 0613 10/25/17 0559 10/26/17 0531 10/27/17 0526  NA 141 142 143 141 142  K 3.8 4.1 3.8 3.5 3.3*  CL 106 108 106 104 101  CO2 29 28 32 31 36*  GLUCOSE 97 93 96 97 95  BUN 24* 19 20 21* 19  CREATININE 0.69 0.59 0.64 0.62 0.62  CALCIUM 7.9* 8.2* 8.1* 7.8* 7.7*   GFR: Estimated Creatinine Clearance: 55.1 mL/min (by C-G formula based on SCr of 0.62 mg/dL). Liver Function Tests: No results for input(s): AST, ALT, ALKPHOS, BILITOT, PROT, ALBUMIN in the last 168 hours. No results for input(s): LIPASE, AMYLASE in the last 168 hours. No results for input(s): AMMONIA in the last 168 hours. Coagulation Profile: No results for input(s): INR, PROTIME in the last 168 hours. Cardiac Enzymes: No results for input(s): CKTOTAL, CKMB, CKMBINDEX, TROPONINI in the last 168 hours. BNP (last 3 results) No results for input(s): PROBNP in the last 8760 hours. HbA1C: No results for input(s): HGBA1C in the last 72 hours. CBG: No results for input(s): GLUCAP in the last 168 hours. Lipid Profile: No results for input(s): CHOL, HDL, LDLCALC, TRIG, CHOLHDL, LDLDIRECT in the last 72 hours. Thyroid Function Tests: No results for input(s): TSH, T4TOTAL, FREET4, T3FREE, THYROIDAB in the last 72 hours. Anemia Panel: No results for input(s): VITAMINB12, FOLATE, FERRITIN, TIBC, IRON, RETICCTPCT in the last 72 hours. Sepsis Labs: No results for input(s): PROCALCITON, LATICACIDVEN in the last 168 hours.  Recent Results (from the past 240 hour(s))  Culture, Urine     Status: None   Collection Time: 10/20/17  5:30 PM  Result Value Ref Range Status   Specimen Description URINE, CLEAN CATCH  Final   Special Requests NONE  Final   Culture   Final    NO GROWTH Performed at [facility], [address] [place], [place] [address]    Report Status 10/22/2017 FINAL  Final  Body fluid culture     Status: None (Preliminary result)   Collection Time: 10/26/17 11:06 AM  Result Value Ref  Range Status   Specimen Description PLEURAL RIGHT  Final   Special Requests NONE  Final   Gram Stain   Final    FEW WBC PRESENT,BOTH PMN AND MONONUCLEAR NO ORGANISMS SEEN    Culture   Final    NO GROWTH < 24 HOURS Performed at [facility], [place] [address] [place], [place] [address]    Report Status PENDING  Incomplete         Radiology Studies: Dg Chest 1 View  Result Date: 10/26/2017 CLINICAL DATA:  Status post RIGHT thoracentesis EXAM: CHEST 1 VIEW COMPARISON:  10/24/2017 FINDINGS: reduction in RIGHT pleural fluid following thoracentesis. No pneumothorax identified. Persistent small bilateral pleural effusions and atelectasis. Severe degenerate change in the RIGHT shoulder joint. IMPRESSION: 1. No apparent pneumothorax following RIGHT thoracentesis. 2. Reduction in RIGHT pleural fluid volume. Electronically Signed   By: Suzy Bouchard M.D.   On: 10/26/2017 11:36  US Thoracentesis Asp Pleural Space W/img Guide  Result Date: 10/26/2017 INDICATION: Patient with history of breast cancer, gastrointestinal stromal tumor, dyspnea, recurrent right pleural effusion. Request made for diagnostic and therapeutic right thoracentesis. EXAM: ULTRASOUND GUIDED DIAGNOSTIC AND THERAPEUTIC RIGHT THORACENTESIS MEDICATIONS: None. COMPLICATIONS: None immediate. PROCEDURE: An ultrasound guided thoracentesis was thoroughly discussed with the patient's daughter (patient with dementia) and questions answered. The benefits, risks, alternatives and complications were also discussed. The patient's daughter understands and wishes to proceed with the procedure. Written consent was obtained. Ultrasound was performed to localize and mark an adequate pocket of fluid in the right chest. The area was then prepped and draped in the normal sterile fashion. 1% Lidocaine was used for local anesthesia. Under ultrasound guidance a Safe-T-Centesis catheter was introduced. Thoracentesis was performed. The catheter was  removed and a dressing applied. FINDINGS: A total of approximately 2 liters of bloody fluid was removed. Samples were sent to the laboratory as requested by the clinical team. IMPRESSION: Successful ultrasound guided diagnostic and therapeutic right thoracentesis yielding 2 liters of pleural fluid. Postprocedure chest x-ray revealed no pneumothorax. Read by: Rowe Robert, PA-C Electronically Signed   By: Jerilynn Mages.  Shick M.D.   On: 10/26/2017 12:02        Scheduled Meds: . docusate sodium  100 mg Oral BID  . donepezil  10 mg Oral QHS  . enoxaparin (LOVENOX) injection  40 mg Subcutaneous Q24H  . fluticasone  1 spray Each Nare Daily  . furosemide  40 mg Intravenous BID  . ipratropium-albuterol  3 mL Nebulization TID  . mirabegron ER  50 mg Oral QHS  . vitamin B-12  1,000 mcg Oral BID   Continuous Infusions: . meropenem (MERREM) IV Stopped (10/27/17 1026)     LOS: 14 days    Time spent: 35 minutes.     Hosie Poisson, MD [facility] Pager [phone] If 7PM-7AM, please contact night-coverage [email or web address] Password [facility] 10/27/2017, 3:48 PM

## 2017-10-28 LAB — BASIC METABOLIC PANEL
Anion gap: 4 — ABNORMAL LOW (ref 5–15)
BUN: 21 mg/dL — AB (ref 6–20)
CALCIUM: 7.7 mg/dL — AB (ref 8.9–10.3)
CO2: 36 mmol/L — ABNORMAL HIGH (ref 22–32)
CREATININE: 0.68 mg/dL (ref 0.44–1.00)
Chloride: 101 mmol/L (ref 101–111)
Glucose, Bld: 93 mg/dL (ref 65–99)
Potassium: 3.2 mmol/L — ABNORMAL LOW (ref 3.5–5.1)
SODIUM: 141 mmol/L (ref 135–145)

## 2017-10-28 LAB — PH, BODY FLUID: pH, Body Fluid: 7.5

## 2017-10-28 MED ORDER — POTASSIUM CHLORIDE CRYS ER 20 MEQ PO TBCR
40.0000 meq | EXTENDED_RELEASE_TABLET | Freq: Two times a day (BID) | ORAL | Status: AC
  Administered 2017-10-28 (×2): 40 meq via ORAL
  Filled 2017-10-28 (×2): qty 2

## 2017-10-28 NOTE — Progress Notes (Signed)
[facility] Liaison:  RN  Per Suanne Marker, [facility], request, I called Mickel Baas [phone] to talk about home with hospice for patient vs. SNF with hospice following.  Mickel Baas wanted to talk in person, but noone is at bedside with patient at this time.  She advised that she would like to get all siblings together to talk with [facility], but is unable to do so right now, due to holiday company and such.  She will check with family and call [facility] back to advise when they will be at [facility].   I advised that we would be glad to meet with family and discuss.   This is not currently a referral, per Suanne Marker, just a request to come speak with family.   Thank you.  Edyth Gunnels, RN, BSN [facility] Liaison [phone]  All hospital liaisons are now on [place].

## 2017-10-28 NOTE — Progress Notes (Signed)
[id number]/[name] with [facility]-will call the daughter in law Mickel Baas and discuss the difference between home and residential hospice./Rhonda Davis,BSN,RN3,CCM [phone]

## 2017-10-28 NOTE — Progress Notes (Signed)
Pharmacy Antibiotic Note  Melanie Trevino is a 83 y.o. female admitted on 10/13/2017 with aspiration PNA.  Pharmacy has been consulted for meropenem dosing.  Plan:  Today is Day 11 of appropriate treatment for aspiration PNA - Rocephin/Flagyl and now Meropenem.  All cultures negative to date.  Patient afebrile and WBC WNL.  Consider discontinuation of antibiotics.   No dose adjustments to Meropenem 1g IV q8h.  Dosage remains stable and need for further dosage adjustment appears unlikely at present.  Pharmacy will sign off at this time.  Please reconsult if a change in clinical status warrants re-evaluation of dosage.  Height: 5\' 4"  (162.6 cm) Weight: 207 lb 8 oz (94.1 kg) IBW/kg (Calculated) : 54.7  Temp (24hrs), Avg:98.2 F (36.8 C), Min:97.8 F (36.6 C), Max:98.6 F (37 C)  Recent Labs  Lab 10/23/17 0613 10/24/17 0613 10/25/17 0559 10/26/17 0531 10/27/17 0526 10/28/17 0455  WBC 4.1  --  5.1  --   --   --   CREATININE 0.69 0.59 0.64 0.62 0.62 0.68    Estimated Creatinine Clearance: 55.1 mL/min (by C-G formula based on SCr of 0.68 mg/dL).    Allergies  Allergen Reactions  . Shellfish Allergy Anaphylaxis  . Other Other (See Comments)    HORSE SERUMYDrug[Other] swelling6/09/2006 12:00:00 AM by Erin Hearing CPhT  . Amoxicillin Swelling and Rash    Has patient had a PCN reaction causing immediate rash, facial/tongue/throat swelling, SOB or lightheadedness with hypotension: UNKNOWN Has patient had a PCN reaction causing severe rash involving mucus membranes or skin necrosis: Unknown Has patient had a PCN reaction that required hospitalization: Unknown Has patient had a PCN reaction occurring within the last 10 years: Unknown If all of the above answers are "NO", then may proceed with Cephalosporin use.     Antimicrobials this admission: 12/11 CTX/azith x1 12/12 lvq: 12/16 12/16 ceftriaxone + flagyl (for asp PNA, PCN allergy)>> 12/20 12/20 meropenem >>   Dose adjustments  this admission: ----  Microbiology results: 12/11 BCx x2: NGF 12/12 Pleural fluid cx: neg FINAL 12/18 urine: NGF 12/24 pleural fluid cx: ngtd 12/24 pleural fluid acid fast smear: neg   Thank you for allowing pharmacy to be a part of this patient's care.  Hershal Coria, PharmD, BCPS Pager: [phone] 10/28/2017 8:06 AM

## 2017-10-28 NOTE — Progress Notes (Signed)
   10/28/17 1500  Clinical Encounter Type  Visited With Patient;Family  Visit Type Initial  Spiritual Encounters  Spiritual Needs Prayer   Visited with son and daughter in law in the hall prior to entering.  Patient has a caregiver present  Patient was welcoming and appreciative to the visit.  She would definitely like to go home, but understands she can't leave just now.  We prayed together.  Will follow as needed. Chaplain Katherene Ponto

## 2017-10-28 NOTE — Progress Notes (Signed)
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 Occult stool was negative. She was transfused 2 units of blood and underwent a thoracentesis on 12/12 with removal of 1.8 L of dark fluid. Cytology was negative for malignancy. Fluid was exudative, culture negative.She was started on levofloxacin and diuresed but her effusion worsened. Modified barium swallow demonstrated no evidence of aspiration but CT chest demonstrated bilateralpneumonia, with fluid in the airways suggesting aspiration. Her antibiotics were changed to cover aspiration pneumonia on 12/16. Pulmonology and palliative care were consulted  12/20-patient continues to cough and oxygen depenedent.patient refused palliative care services.care giver reports patient vomitted breakfast. 12/21-care giver reports upon turning the patient oxygen sats drops.  12/22-patient appears to be short of breath.  And she does complain of shortness of breath.  10/25/17 - Still SOB. CXR reviewed. Palliative care input is appreciated. Will consider US guided thoracentesis. Will increase IV Lasix from 20mg  daily to 40mg  Bid.  10/26/17 - Patient seen today alongside patient's caregiver. Patient underwent US guided thoracentesis. 2L of bloody fluid drained. Will follow fluid analysis. Patient looks a lot better post thoracentesis. Dose of Lasix was also doubled yesterday. SOB has improved significantly. Monitor respiratory status, renal function and electrolytes. Palliative care team is discussing goal of care with patient's family.     Assessment & Plan:   Principal Problem:   Pleural effusion Active Problems:   Breast  cancer (HCC)   Dementia of the Alzheimer's type   Gastrointestinal stromal tumor (GIST) (HCC)   Wheezing   Pressure injury of skin  Acute hypoxic respiratory distress due to large right-sided (Prior pleural fluid analysis is deemed exudative pleural effusion by LDH criteria, possibly due to aspiration pneumonia) - Patient underwent repeat thoracentesis today, and 2L of bloody pleural effusion taken out. - Follow pleural fluid analysis. - Continue antbiotics (ceftriaxone + flagyl stopped 12/20.meropenem started 12/20) - Palliative care and pulmonology assistance appreciated.     acute on chronic diastolic heart failure. - Increase IV lasix to 40mg  Bid.  Incidental finding on CT:Air within the biliary tree, likely contained. Patient does not exhibits signs or symptoms of necrotic bowel or other abdominal catastrophe.   Anemia due to chronic disease - occult stool negative.  - Transfused 2 unit PRBC on 12/11  Demand ischemia due to anemia and pleural effusion - No chest pain reported today (10/25/17) -Troponin first set is 0.67, subsequent troponin 0 0.19, 0.19, 0.15  - EKG is negative for any acute ST-T changes.  - No regional wall motion abnormalities on ECHO  Paroxysmal atrial fibrillation, not on anticoagulation due to deconditioning/falls risk.  Essential hypertension, now with hypotension, holding diuretics. [place].        DVT prophylaxis: lovenox Code Status:full Family Communication:none Disposition Plan: tbd Consultants: pccm,palliative  Procedures:thoracentesis Antimicrobialsmeropenem Subjective: Resting with eyes closed.easily arousable.denies any new complaints  Objective: Vitals:   10/27/17 1951 10/27/17 2049 10/28/17 0543 10/28/17 0733  BP:  (!) 98/47 123/61   Pulse:  66 (!) 59   Resp:  18 18   Temp:  97.8 F (36.6 C) 98.6 F (37 C)   TempSrc:  Oral Oral   SpO2: 98% 92% 99% 96%  Weight:      Height:  Intake/Output Summary (Last 24 hours) at 10/28/2017 0741 Last data filed at 10/28/2017 0536 Gross per 24 hour  Intake 820 ml  Output 3350 ml  Net -2530 ml   Filed Weights   10/25/17 0512 10/26/17 0544 10/27/17 1244  Weight: 93.4 kg (205 lb 12.8 oz) 91.1 kg (200 lb 14.4 oz) 94.1 kg (207 lb 8 oz)    Examination:  General exam: Appears calm and comfortable  Respiratory system: Clear to auscultation. Respiratory effort normal. Cardiovascular system: S1 & S2 heard, RRR. No JVD, murmurs, rubs, gallops or clicks. No pedal edema. Gastrointestinal system: Abdomen is nondistended, soft and nontender. No organomegaly or masses felt. Normal bowel sounds heard. Central nervous system: Alert and oriented. No focal neurological deficits. Extremities: Symmetric 5 x 5 power. Skin: No rashes, lesions or ulcers Psychiatry: Judgement and insight appear normal. Mood & affect appropriate.     Data Reviewed: I have personally reviewed following labs and imaging studies  CBC: Recent Labs  Lab 10/23/17 0613 10/25/17 0559 10/27/17 1733  WBC 4.1 5.1  --   HGB 10.0* 9.7* 9.9*  HCT 31.3* 30.7* 31.0*  MCV 96.9 97.5  --   PLT 181 184  --    Basic Metabolic Panel: Recent Labs  Lab 10/24/17 0613 10/25/17 0559 10/26/17 0531 10/27/17 0526 10/28/17 0455  NA 142 143 141 142 141  K 4.1 3.8 3.5 3.3* 3.2*  CL 108 106 104 101 101  CO2 28 32 31 36* 36*  GLUCOSE 93 96 97 95 93  BUN 19 20 21* 19 21*  CREATININE 0.59 0.64 0.62 0.62 0.68  CALCIUM 8.2* 8.1* 7.8* 7.7* 7.7*   GFR: Estimated Creatinine Clearance: 55.1 mL/min (by C-G formula based on SCr of 0.68 mg/dL). Liver Function Tests: No results for input(s): AST, ALT, ALKPHOS, BILITOT, PROT, ALBUMIN in the last 168 hours. No results for input(s): LIPASE, AMYLASE in the last 168 hours. No results for input(s): AMMONIA in the last 168 hours. Coagulation Profile: No results for input(s): INR, PROTIME in the last 168 hours. Cardiac  Enzymes: No results for input(s): CKTOTAL, CKMB, CKMBINDEX, TROPONINI in the last 168 hours. BNP (last 3 results) No results for input(s): PROBNP in the last 8760 hours. HbA1C: No results for input(s): HGBA1C in the last 72 hours. CBG: No results for input(s): GLUCAP in the last 168 hours. Lipid Profile: No results for input(s): CHOL, HDL, LDLCALC, TRIG, CHOLHDL, LDLDIRECT in the last 72 hours. Thyroid Function Tests: No results for input(s): TSH, T4TOTAL, FREET4, T3FREE, THYROIDAB in the last 72 hours. Anemia Panel: No results for input(s): VITAMINB12, FOLATE, FERRITIN, TIBC, IRON, RETICCTPCT in the last 72 hours. Sepsis Labs: No results for input(s): PROCALCITON, LATICACIDVEN in the last 168 hours.  Recent Results (from the past 240 hour(s))  Culture, Urine     Status: None   Collection Time: 10/20/17  5:30 PM  Result Value Ref Range Status   Specimen Description URINE, CLEAN CATCH  Final   Special Requests NONE  Final   Culture   Final    NO GROWTH Performed at [facility], [address], [place], [place] [address]    Report Status 10/22/2017 FINAL  Final  Body fluid culture     Status: None (Preliminary result)   Collection Time: 10/26/17 11:06 AM  Result Value Ref Range Status   Specimen Description PLEURAL RIGHT  Final   Special Requests NONE  Final   Gram Stain   Final    FEW WBC PRESENT,BOTH PMN  AND MONONUCLEAR NO ORGANISMS SEEN    Culture   Final    NO GROWTH < 24 HOURS Performed at [facility], [place] [address], [place], [place] [address]    Report Status PENDING  Incomplete  Acid Fast Smear (AFB)     Status: None   Collection Time: 10/26/17 11:06 AM  Result Value Ref Range Status   AFB Specimen Processing Concentration  Final   Acid Fast Smear Negative  Final    Comment: (NOTE) Performed At: [facility] [place], [place] [id number] Rush Farmer MD [id number]    Source (AFB) PLEURAL  Final    Comment: RIGHT  Acid  Fast Smear (AFB)     Status: None   Collection Time: 10/26/17 11:06 AM  Result Value Ref Range Status   AFB Specimen Processing Concentration  Final   Acid Fast Smear Negative  Final    Comment: (NOTE) Performed At: [facility] [address] [place], [place] [id number] Rush Farmer MD [id number]    Source (AFB) PLEURAL  Final         Radiology Studies: Dg Chest 1 View  Result Date: 10/26/2017 CLINICAL DATA:  Status post RIGHT thoracentesis EXAM: CHEST 1 VIEW COMPARISON:  10/24/2017 FINDINGS: reduction in RIGHT pleural fluid following thoracentesis. No pneumothorax identified. Persistent small bilateral pleural effusions and atelectasis. Severe degenerate change in the RIGHT shoulder joint. IMPRESSION: 1. No apparent pneumothorax following RIGHT thoracentesis. 2. Reduction in RIGHT pleural fluid volume. Electronically Signed   By: Suzy Bouchard M.D.   On: 10/26/2017 11:36   US Thoracentesis Asp Pleural Space W/img Guide  Result Date: 10/26/2017 INDICATION: Patient with history of breast cancer, gastrointestinal stromal tumor, dyspnea, recurrent right pleural effusion. Request made for diagnostic and therapeutic right thoracentesis. EXAM: ULTRASOUND GUIDED DIAGNOSTIC AND THERAPEUTIC RIGHT THORACENTESIS MEDICATIONS: None. COMPLICATIONS: None immediate. PROCEDURE: An ultrasound guided thoracentesis was thoroughly discussed with the patient's daughter (patient with dementia) and questions answered. The benefits, risks, alternatives and complications were also discussed. The patient's daughter understands and wishes to proceed with the procedure. Written consent was obtained. Ultrasound was performed to localize and mark an adequate pocket of fluid in the right chest. The area was then prepped and draped in the normal sterile fashion. 1% Lidocaine was used for local anesthesia. Under ultrasound guidance a Safe-T-Centesis catheter was introduced. Thoracentesis was performed. The  catheter was removed and a dressing applied. FINDINGS: A total of approximately 2 liters of bloody fluid was removed. Samples were sent to the laboratory as requested by the clinical team. IMPRESSION: Successful ultrasound guided diagnostic and therapeutic right thoracentesis yielding 2 liters of pleural fluid. Postprocedure chest x-ray revealed no pneumothorax. Read by: Rowe Robert, PA-C Electronically Signed   By: Jerilynn Mages.  Shick M.D.   On: 10/26/2017 12:02        Scheduled Meds: . docusate sodium  100 mg Oral BID  . donepezil  10 mg Oral QHS  . enoxaparin (LOVENOX) injection  40 mg Subcutaneous Q24H  . fluticasone  1 spray Each Nare Daily  . furosemide  40 mg Intravenous BID  . ipratropium-albuterol  3 mL Nebulization TID  . mirabegron ER  50 mg Oral QHS  . vitamin B-12  1,000 mcg Oral BID   Continuous Infusions: . meropenem (MERREM) IV Stopped (10/28/17 0251)     LOS: 15 days       Georgette Shell, MD [facility] If 7PM-7AM, please contact night-coverage [email or web address] Password [facility] 10/28/2017, 7:41 AM

## 2017-10-28 NOTE — Plan of Care (Signed)
  Progressing Education: Knowledge of General Education information will improve 10/28/2017 2236 - Progressing by Talbert Forest, RN Clinical Measurements: Ability to maintain clinical measurements within normal limits will improve 10/28/2017 2236 - Progressing by Talbert Forest, RN Coping: Level of anxiety will decrease 10/28/2017 2236 - Progressing by Talbert Forest, RN Elimination: Will not experience complications related to urinary retention 10/28/2017 2236 - Progressing by Talbert Forest, RN Pain Managment: General experience of comfort will improve 10/28/2017 2236 - Progressing by Talbert Forest, RN

## 2017-10-29 ENCOUNTER — Inpatient Hospital Stay (HOSPITAL_COMMUNITY): Payer: Medicare Other

## 2017-10-29 LAB — BASIC METABOLIC PANEL
Anion gap: 5 (ref 5–15)
BUN: 21 mg/dL — AB (ref 6–20)
CALCIUM: 7.8 mg/dL — AB (ref 8.9–10.3)
CHLORIDE: 100 mmol/L — AB (ref 101–111)
CO2: 36 mmol/L — ABNORMAL HIGH (ref 22–32)
CREATININE: 0.63 mg/dL (ref 0.44–1.00)
Glucose, Bld: 95 mg/dL (ref 65–99)
Potassium: 3.6 mmol/L (ref 3.5–5.1)
SODIUM: 141 mmol/L (ref 135–145)

## 2017-10-29 LAB — BODY FLUID CULTURE: Culture: NO GROWTH

## 2017-10-29 NOTE — Progress Notes (Addendum)
Palliative care progress note Reason for consult: Goals of care in light of dementia, GIST, and current admission for pneumonia, recurrent pleural effusion.   Chart reviewed and discussed with bedside RN.  I met today briefly with Melanie Trevino (she is confused and does not understand complexities of her medical condition at this time).  I met outside the room with her daughter in law Melanie Trevino as well as her grand daughter. we reviewed the patient's current condition, her prognosis, her trajectory, her disposition choices.   BP (!) 110/59 (BP Location: Left Arm)   Pulse (!) 55   Temp 98.1 F (36.7 C) (Oral)   Resp 18   Ht 5' 4"  (1.626 m)   Wt 82.7 kg (182 lb 4.8 oz)   SpO2 100%   BMI 31.29 kg/m  Labs and imaging noted Caregiver at bedside says patient ate 100% of her lunch  Awake alert but suspicious and appears anxious at times S1 S2 Shallow clear breath sounds anteriorly, diminished both bases Abdomen soft Trace edema Dementia at baseline.  PPS 30-40%  Family continues to struggle with how best to support the patient and her wishes. She has caregivers at home. We discussed about home with hospice versus home with home health care versus SNF rehab with palliative care on discharge. Melanie Trevino will contiue discussions with her husband and the rest of the family regarding the best possible disposition.   We also discussed about repeating CXR and following up from previous CXR. Patient might need a pig tail catheter if there is significant re accumulation.      Total time: 35 minutes Greater than 50%  of this time was spent counseling and coordinating care related to the above assessment and plan.  Loistine Chance, MD [place] Team [phone]  --------------------- Addendum: 10-29-17 1643  CXR results noted, patient has recurrent right-sided pleural effusion, potentially partially Loculated. Additionally, there is also an opacity at the left base  persists, likely small pleural effusion.  Recommend PCCM follow up, they have seen the patient earlier in this hospitalization, family's goals are not comfort only, they're not interested in meeting with hospice liaison at this time, as per my earlier discussions with daughter in law Melanie Trevino. Paged Dr Marthenia Rolling [facility] MD.   PMT will follow up again on 10-30-17.  Loistine Chance MD

## 2017-10-29 NOTE — Progress Notes (Signed)
LCSW consulted for SNF.  LCSW provided family with bed offers. Family not interested in the bed offers provided.   Family interested in long term care.   LCSW signing off. No further CSW needs. Please submit new consult if CSW needs arise.   Carolin Coy [facility] [place]

## 2017-10-29 NOTE — Progress Notes (Signed)
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 Occult stool was negative. She was transfused 2 units of blood and underwent a thoracentesis on 12/12 with removal of 1.8 L of dark fluid. Cytology was negative for malignancy. Fluid was exudative, culture negative.She was started on levofloxacin and diuresed but her effusion worsened. Modified barium swallow demonstrated no evidence of aspiration but CT chest demonstrated bilateralpneumonia, with fluid in the airways suggesting aspiration. Her antibiotics were changed to cover aspiration pneumonia on 12/16. Pulmonology and palliative care were consulted  10/29/17  - Patient seen today alongside patient's caregiver. Patient underwent US guided thoracentesis. 2L of bloody fluid drained. Fluid analysis is noted. Patient looks a lot better post thoracentesis. SOB has improved significantly. Monitor respiratory status, renal function and electrolytes. Palliative care team is discussing goal of care with patient's family. Option of chest tube (Pleurx catheter) broached if pleural effusion continues to reoccur.  Assessment & Plan:   Principal Problem:   Pleural effusion Active Problems:   Breast cancer (HCC)   Dementia of the Alzheimer's type   Gastrointestinal stromal tumor (GIST) (HCC)   Wheezing   Pressure injury of skin  Acute hypoxic respiratory distress due to large right-sided (Prior pleural fluid analysis is deemed exudative pleural effusion by LDH criteria, possibly due to aspiration pneumonia) - Resolved significantly - Patient underwent repeat thoracentesis today, and 2L of bloody pleural effusion taken out. -  Follow pleural fluid analysis. - Continue antbiotics (ceftriaxone + flagyl stopped 12/20.meropenem started 12/20) - Palliative care and pulmonology assistance appreciated.     acute on chronic diastolic heart failure. - Increase IV lasix to 40mg  Bid.  Incidental finding on CT:Air within the biliary tree, likely contained. Patient does not exhibits signs or symptoms of necrotic bowel or other abdominal catastrophe.   Anemia due to chronic disease - occult stool negative.  - Transfused 2 unit PRBC on 12/11  Demand ischemia due to anemia and pleural effusion - No chest pain reported today (10/25/17) -Troponin first set is 0.67, subsequent troponin 0 0.19, 0.19, 0.15  - EKG is negative for any acute ST-T changes.  - No regional wall motion abnormalities on ECHO  Paroxysmal atrial fibrillation, not on anticoagulation due to deconditioning/falls risk.  Essential hypertension, now with hypotension, holding diuretics. [place].        DVT prophylaxis: lovenox Code Status:full Family Communication:none Disposition Plan: tbd Consultants: pccm,palliative  Procedures:thoracentesis Antimicrobialsmeropenem Subjective: Resting with eyes closed.easily arousable.denies any new complaints  Objective: Vitals:   10/29/17 0500 10/29/17 0552 10/29/17 0834 10/29/17 1341  BP:  (!) 119/57  (!) 110/59  Pulse:  (!) 56  (!) 55  Resp:  20  18  Temp:  98.5 F (36.9 C)  98.1 F (36.7 C)  TempSrc:  Oral  Oral  SpO2:  98% 96% 100%  Weight: 82.7 kg (182 lb 4.8 oz)     Height:        Intake/Output Summary (Last 24 hours) at 10/29/2017 1551 Last data filed at 10/29/2017 1341 Gross per 24 hour  Intake 820 ml  Output 1750 ml  Net -930 ml   Filed Weights   10/26/17 0544 10/27/17 1244 10/29/17 0500  Weight: 91.1 kg (200 lb 14.4 oz)  94.1 kg (207 lb 8 oz) 82.7 kg (182 lb 4.8 oz)    Examination:  General exam: Appears calm and comfortable  Respiratory  system: Clear to auscultation. Respiratory effort normal. Cardiovascular system: S1 & S2 heard, RRR. No JVD, murmurs, rubs, gallops or clicks. No pedal edema. Gastrointestinal system: Abdomen is nondistended, soft and nontender. No organomegaly or masses felt. Normal bowel sounds heard. Central nervous system: Alert and oriented. No focal neurological deficits. Extremities: Symmetric 5 x 5 power. Skin: No rashes, lesions or ulcers Psychiatry: Judgement and insight appear normal. Mood & affect appropriate.     Data Reviewed: I have personally reviewed following labs and imaging studies  CBC: Recent Labs  Lab 10/23/17 0613 10/25/17 0559 10/27/17 1733  WBC 4.1 5.1  --   HGB 10.0* 9.7* 9.9*  HCT 31.3* 30.7* 31.0*  MCV 96.9 97.5  --   PLT 181 184  --    Basic Metabolic Panel: Recent Labs  Lab 10/25/17 0559 10/26/17 0531 10/27/17 0526 10/28/17 0455 10/29/17 0511  NA 143 141 142 141 141  K 3.8 3.5 3.3* 3.2* 3.6  CL 106 104 101 101 100*  CO2 32 31 36* 36* 36*  GLUCOSE 96 97 95 93 95  BUN 20 21* 19 21* 21*  CREATININE 0.64 0.62 0.62 0.68 0.63  CALCIUM 8.1* 7.8* 7.7* 7.7* 7.8*   GFR: Estimated Creatinine Clearance: 51.5 mL/min (by C-G formula based on SCr of 0.63 mg/dL). Liver Function Tests: No results for input(s): AST, ALT, ALKPHOS, BILITOT, PROT, ALBUMIN in the last 168 hours. No results for input(s): LIPASE, AMYLASE in the last 168 hours. No results for input(s): AMMONIA in the last 168 hours. Coagulation Profile: No results for input(s): INR, PROTIME in the last 168 hours. Cardiac Enzymes: No results for input(s): CKTOTAL, CKMB, CKMBINDEX, TROPONINI in the last 168 hours. BNP (last 3 results) No results for input(s): PROBNP in the last 8760 hours. HbA1C: No results for input(s): HGBA1C in the last 72 hours. CBG: No results for input(s): GLUCAP in the last 168 hours. Lipid Profile: No results for input(s): CHOL, HDL, LDLCALC, TRIG, CHOLHDL, LDLDIRECT in the last  72 hours. Thyroid Function Tests: No results for input(s): TSH, T4TOTAL, FREET4, T3FREE, THYROIDAB in the last 72 hours. Anemia Panel: No results for input(s): VITAMINB12, FOLATE, FERRITIN, TIBC, IRON, RETICCTPCT in the last 72 hours. Sepsis Labs: No results for input(s): PROCALCITON, LATICACIDVEN in the last 168 hours.  Recent Results (from the past 240 hour(s))  Culture, Urine     Status: None   Collection Time: 10/20/17  5:30 PM  Result Value Ref Range Status   Specimen Description URINE, CLEAN CATCH  Final   Special Requests NONE  Final   Culture   Final    NO GROWTH Performed at [facility], [address], [place], [place] [address]    Report Status 10/22/2017 FINAL  Final  Body fluid culture     Status: None   Collection Time: 10/26/17 11:06 AM  Result Value Ref Range Status   Specimen Description PLEURAL RIGHT  Final   Special Requests NONE  Final   Gram Stain   Final    FEW WBC PRESENT,BOTH PMN AND MONONUCLEAR NO ORGANISMS SEEN    Culture   Final    NO GROWTH 3 DAYS Performed at [facility], [place] [address], [place], [place] [address]    Report Status 10/29/2017 FINAL  Final  Acid Fast Smear (AFB)     Status:  None   Collection Time: 10/26/17 11:06 AM  Result Value Ref Range Status   AFB Specimen Processing Concentration  Final   Acid Fast Smear Negative  Final    Comment: (NOTE) Performed At: [facility] [place], [place] [id number] Rush Farmer MD DP:[id number]    Source (AFB) PLEURAL  Final    Comment: RIGHT  Acid Fast Smear (AFB)     Status: None   Collection Time: 10/26/17 11:06 AM  Result Value Ref Range Status   AFB Specimen Processing Concentration  Final   Acid Fast Smear Negative  Final    Comment: (NOTE) Performed At: [facility] [place], [place] [id number] Rush Farmer MD PY:[id number]    Source (AFB) PLEURAL  Final         Radiology Studies: Dg Chest Port 1  View  Result Date: 10/29/2017 CLINICAL DATA:  83 year old female with a history of pleural effusion EXAM: PORTABLE CHEST 1 VIEW COMPARISON:  11/26/2016, 10/24/2017 FINDINGS: Cardiomediastinal silhouette unchanged with cardiomegaly. Calcifications of the aortic arch. Recurrence of peripheral basilar opacity on the right with obscuration the right hemidiaphragm and veiled opacity at the right lung base. Thickening of the minor fissure. No pneumothorax. Opacity at the left base with blunting of the left costophrenic angle. No pneumothorax. Surgical changes of the neck IMPRESSION: Recurrent right-sided pleural effusion, potentially partially loculated. Opacity at the left base persists, likely small pleural effusion. Cardiomegaly and aortic calcifications. Electronically Signed   By: Corrie Mckusick D.O.   On: 10/29/2017 15:18        Scheduled Meds: . docusate sodium  100 mg Oral BID  . donepezil  10 mg Oral QHS  . enoxaparin (LOVENOX) injection  40 mg Subcutaneous Q24H  . fluticasone  1 spray Each Nare Daily  . furosemide  40 mg Intravenous BID  . ipratropium-albuterol  3 mL Nebulization TID  . mirabegron ER  50 mg Oral QHS  . vitamin B-12  1,000 mcg Oral BID   Continuous Infusions: . meropenem (MERREM) IV Stopped (10/29/17 1128)     LOS: 16 days       Bonnell Public, MD [facility] If 7PM-7AM, please contact night-coverage [email or web address] Password [facility] 10/29/2017, 3:51 PM

## 2017-10-29 NOTE — Progress Notes (Signed)
[facility] Liaison:  RN  Left message for Suanne Marker [facility], to find out if patient family is still wanting [place] to come and talk with them.  We are still available to talk with family.   Thank you.  Edyth Gunnels, RN, BSN [facility] Liaison [phone]  All hospital liaisons are now on [place].

## 2017-10-29 NOTE — Progress Notes (Signed)
[facility] Liaison:  RN  Reached out to Mickel Baas, daughter, again this morning to see if we could set up a time to meet with them per their request.  She again states that it's hard to get family in and that she would not be able to make it today herself, but would ask Elmo Putt if she could come to hospital.  Suanne Marker, [facility] aware that I have not been able to get family to the hospital to meet with them.  They do not want to have conversations over the phone.    If you have any questions, please feel free to contact me.   Thanks.   Edyth Gunnels, RN, BSN [facility] Liaison [phone]  All hospital liaisons are now on [place].

## 2017-10-29 NOTE — Progress Notes (Signed)
Date: October 29, 2017 Rhonda Davis, BSN, RN3, CCM [phone] Chart and notes review for patient progress and needs. Will follow for case management and discharge needs. Next review date: [id number] 

## 2017-10-30 ENCOUNTER — Inpatient Hospital Stay (HOSPITAL_COMMUNITY): Payer: Medicare Other

## 2017-10-30 DIAGNOSIS — C49A0 Gastrointestinal stromal tumor, unspecified site: Secondary | ICD-10-CM

## 2017-10-30 DIAGNOSIS — D649 Anemia, unspecified: Secondary | ICD-10-CM

## 2017-10-30 DIAGNOSIS — R748 Abnormal levels of other serum enzymes: Secondary | ICD-10-CM

## 2017-10-30 DIAGNOSIS — J181 Lobar pneumonia, unspecified organism: Secondary | ICD-10-CM

## 2017-10-30 DIAGNOSIS — F0280 Dementia in other diseases classified elsewhere without behavioral disturbance: Secondary | ICD-10-CM

## 2017-10-30 DIAGNOSIS — G309 Alzheimer's disease, unspecified: Secondary | ICD-10-CM

## 2017-10-30 LAB — BASIC METABOLIC PANEL
Anion gap: 7 (ref 5–15)
BUN: 25 mg/dL — AB (ref 6–20)
CALCIUM: 8.2 mg/dL — AB (ref 8.9–10.3)
CHLORIDE: 102 mmol/L (ref 101–111)
CO2: 36 mmol/L — ABNORMAL HIGH (ref 22–32)
CREATININE: 0.75 mg/dL (ref 0.44–1.00)
GFR calc non Af Amer: >60 mL/min (ref >60)
Glucose, Bld: 96 mg/dL (ref 65–99)
Potassium: 3.2 mmol/L — ABNORMAL LOW (ref 3.5–5.1)
SODIUM: 145 mmol/L (ref 135–145)

## 2017-10-30 MED ORDER — FUROSEMIDE 10 MG/ML IJ SOLN
40.0000 mg | Freq: Every day | INTRAMUSCULAR | Status: DC
  Administered 2017-10-31: 40 mg via INTRAVENOUS
  Filled 2017-10-30: qty 4

## 2017-10-30 NOTE — Progress Notes (Signed)
Nutrition Brief Note  Patient identified as extended LOS.   Wt Readings from Last 15 Encounters:  10/30/17 176 lb 11.2 oz (80.2 kg)  07/30/16 220 lb 0.3 oz (99.8 kg)  08/05/16 220 lb (99.8 kg)  07/11/15 237 lb 7 oz (107.7 kg)   Pt with PMH significant for GIST with liver metastases in [place], breast cancer s/p mastectomy, and dementia. Presents this admission with right pleural effusion and anemia.   Body mass index is 30.33 kg/m. Patient meets criteria for obese based on current BMI.   Current diet order is reguar, patient is consuming approximately 100% of meals at this time. Labs and medications reviewed.   No nutrition interventions warranted at this time. If nutrition issues arise, please consult RD.   Mariana Single RD, LDN Clinical Nutrition Pager # [phone]

## 2017-10-30 NOTE — Care Management Important Message (Signed)
Important Message  Patient Details  Name: Melanie Trevino MRN: [id number] Date of Birth: 08-15-1928   Medicare Important Message Given:  Yes    Kerin Salen 10/30/2017, 11:01 AMImportant Message  Patient Details  Name: Melanie Trevino MRN: [id number] Date of Birth: [birth year]   Medicare Important Message Given:  Yes    Kerin Salen 10/30/2017, 11:01 AM

## 2017-10-30 NOTE — Progress Notes (Signed)
PROGRESS NOTE  Melanie Trevino  BPZ:[id number] DOB: 09/15/1928 DOA: 10/13/2017 PCP: Wenda Low, MD   Brief Narrative: Melanie Trevino is an 83 y.o. 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 2u PRBCs were transfused and thoracentesis 12/12 yielded 1.8L dark fluid found to be exudative with negative cytology and culture. She was started on levaquin and diuresed but effusion recurred. Modified barium swallow demonstrated no evidence of aspiration but CT chest demonstrated bilateralpneumonia, with fluid in the airways suggesting aspiration. Her antibiotics were changed to cover aspiration pneumonia on 12/16. Pulmonology and palliative care were consulted. Repeat thoracentesis yielded 2L bloody fluid with follow up CXR demonstrating ongoing right effusion possibly with loculations. This provided symptomatic relief. Palliative care team is discussing goal of care with patient's family. Option of chest tube (Pleurx catheter) broached if pleural effusion continues to reoccur.  Assessment & Plan: Principal Problem:   Pleural effusion Active Problems:   Breast cancer (HCC)   Dementia of the Alzheimer's type   Gastrointestinal stromal tumor (GIST) (HCC)   Wheezing   Pressure injury of skin  Acute hypoxic respiratory failure due to large recurrent right-sided pleural effusion: Exudative with negative cytology and culture. Repeat thora w/2L out and symptomatically improved, follow up CXR showed reaccumulation. High suspicion for aspiration pneumonia/pneumonitis.  - Discussed with pulmonology, Dr. Halford Chessman, who recommends repeating CT chest which is ordered. May benefit from pleurx. -Completed abx: ceftriaxone + flagyl stopped 12/20, meropenem started 12/20. Will complete course 12/28. - Palliative care and pulmonology  assistance appreciated.   Acute on chronic diastolic heart failure: Repeat echo 12/12 showed improved EF to 60-65% with elevated right and left heart pressures, G1DD, no regional WMA's.  - De-escalate to lasix 40mg  IV daily - Change to heart healthy diet - Dtrict I/O, daily weights.   Pneumobilia: incidentally noted on CT. Asymptomatic without evidence of abdominal infection/necrosis.  - Monitor clinically  Anemia due to chronic disease: FOBT neg. s/p 2u PRBCs 12/11. Ferritin wnl at 160.  - Monitor CBC in AM and prn bleeding.   Demand ischemia due to anemia and pleural effusion: Asymptomatic. Troponin downtrended 0.67 >> 0.15. EKG is negative for any acute ST-T changes. No regional wall motion abnormalities on echo. - Monitor clinically  Paroxysmal atrial fibrillation: Currently NSR.  - No anticoagulation due to fall risk, thrombocytopenia.   Essential hypertension:  - Currently hypotensive, so stoppedlosartan.  Alzheimer's dementia: Chronic, stable - Continue aricept - Continuing to be in contact with family for medical decision-making.  GIST with liver metastases: follows at [facility]. - Continue imatinib  History of breast cancer status post mastectomy: in remission  Stage 1 pressure ulcer on sacrum:  - Offloading as able, allevyn.  DVT prophylaxis: Lovenox Code Status: DNR Family Communication: Called son. Disposition Plan: Uncertain at this time.  Consultants:   Palliative care  Pulmonology  Procedures:   Thoracentesis 12/12 and 12/24.   Echocardiogram 10/14/2017: - Left ventricle: The cavity size was normal. Wall thickness was   increased in a pattern of moderate LVH. Systolic function was   normal. The estimated ejection fraction was in the range of 60%   to 65%. Wall motion was normal; there were no regional wall   motion abnormalities. Doppler parameters are consistent with   abnormal left ventricular relaxation (grade 1 diastolic  dysfunction). The E/e&' ratio is >15, suggesting elevated LV   filling pressure. - Left atrium: The atrium was normal in size. - Right atrium: Moderately dilated. - Pulmonary arteries: PA peak pressure: 36 mm Hg (S). - Systemic veins: The IVC measures <2.1 cm, but does not collapse   >50%, suggesting an elevated RA pressure of 8 mmHg. - Pericardium, extracardiac: Small pericardial effusion. No   tamponade physiology. There was a right pleural effusion.  Impressions: - Compared to a prior study in 2017, sinus rhythm is present. The   LVEF is higher at 60-65%, however, there is grade 1 DD with   elevated left and right heart filling pressures, a small   pericardial effusion without tamponade physiology is noted along   with a right pleural effusion.  Antimicrobials:  Ceftriaxone, flagyl  Meropenem 12/20 - 12/28   Subjective: Confused, uncertain of why she's in the hospital. Denies pain. Caregiver at bedside reports she's had a mild cough that's improving, doesn't feel out of breath.   Objective: Vitals:   10/29/17 2051 10/30/17 0527 10/30/17 0915 10/30/17 1346  BP: (!) 107/44 (!) 126/59  (!) 111/46  Pulse: 65 (!) 54 86 (!) 55  Resp: 18 18 18 18   Temp: 98.7 F (37.1 C) 98.5 F (36.9 C)  98.3 F (36.8 C)  TempSrc: Oral Oral  Oral  SpO2: 95% 97% 95% 96%  Weight:  80.2 kg (176 lb 11.2 oz)    Height:        Intake/Output Summary (Last 24 hours) at 10/30/2017 1348 Last data filed at 10/30/2017 0939 Gross per 24 hour  Intake 340 ml  Output 850 ml  Net -510 ml   Filed Weights   10/27/17 1244 10/29/17 0500 10/30/17 0527  Weight: 94.1 kg (207 lb 8 oz) 82.7 kg (182 lb 4.8 oz) 80.2 kg (176 lb 11.2 oz)    Gen: Elderly female in no distress Pulm: Non-labored breathing 2L. Diminished at lower and midlung zones bilaterally R > L. No crackles or wheezes.  CV: Regular rate and rhythm. No murmur, rub, or gallop. No JVD, trace pedal edema. GI: Abdomen soft, non-tender,  non-distended, with normoactive bowel sounds. No organomegaly or masses felt. Ext: Warm, no deformities Skin: Stage I sacral pressure ulcer Neuro: Alert, not oriented. No focal neurological deficits. Psych: Judgement and insight appear normal. Mood & affect appropriate.   Data Reviewed: I have personally reviewed following labs and imaging studies  CBC: Recent Labs  Lab 10/25/17 0559 10/27/17 1733  WBC 5.1  --   HGB 9.7* 9.9*  HCT 30.7* 31.0*  MCV 97.5  --   PLT 184  --    Basic Metabolic Panel: Recent Labs  Lab 10/26/17 0531 10/27/17 0526 10/28/17 0455 10/29/17 0511 10/30/17 0517  NA 141 142 141 141 145  K 3.5 3.3* 3.2* 3.6 3.2*  CL 104 101 101 100* 102  CO2 31 36* 36* 36* 36*  GLUCOSE 97 95 93 95 96  BUN 21* 19 21* 21* 25*  CREATININE 0.62 0.62 0.68 0.63 0.75  CALCIUM 7.8* 7.7* 7.7* 7.8* 8.2*   GFR: Estimated Creatinine Clearance: 50.8 mL/min (by C-G formula based on SCr of 0.75 mg/dL). Liver Function Tests: No results for input(s): AST, ALT, ALKPHOS, BILITOT, PROT, ALBUMIN in the last 168 hours. No results for input(s): LIPASE, AMYLASE in the last 168 hours. No results for input(s): AMMONIA in the last 168 hours. Coagulation Profile: No results for input(s): INR, PROTIME in the last 168 hours. Cardiac Enzymes:  No results for input(s): CKTOTAL, CKMB, CKMBINDEX, TROPONINI in the last 168 hours. BNP (last 3 results) No results for input(s): PROBNP in the last 8760 hours. HbA1C: No results for input(s): HGBA1C in the last 72 hours. CBG: No results for input(s): GLUCAP in the last 168 hours. Lipid Profile: No results for input(s): CHOL, HDL, LDLCALC, TRIG, CHOLHDL, LDLDIRECT in the last 72 hours. Thyroid Function Tests: No results for input(s): TSH, T4TOTAL, FREET4, T3FREE, THYROIDAB in the last 72 hours. Anemia Panel: No results for input(s): VITAMINB12, FOLATE, FERRITIN, TIBC, IRON, RETICCTPCT in the last 72 hours. Urine analysis:    Component Value  Date/Time   COLORURINE YELLOW 10/20/2017 1730   APPEARANCEUR HAZY (A) 10/20/2017 1730   LABSPEC 1.019 10/20/2017 1730   PHURINE 5.0 10/20/2017 1730   GLUCOSEU NEGATIVE 10/20/2017 1730   HGBUR NEGATIVE 10/20/2017 1730   BILIRUBINUR NEGATIVE 10/20/2017 1730   BILIRUBINUR negative 04/05/2016 1414   KETONESUR NEGATIVE 10/20/2017 1730   PROTEINUR NEGATIVE 10/20/2017 1730   UROBILINOGEN 0.2 04/05/2016 1414   UROBILINOGEN 1.0 07/07/2015 1901   NITRITE NEGATIVE 10/20/2017 1730   LEUKOCYTESUR TRACE (A) 10/20/2017 1730   Recent Results (from the past 240 hour(s))  Culture, Urine     Status: None   Collection Time: 10/20/17  5:30 PM  Result Value Ref Range Status   Specimen Description URINE, CLEAN CATCH  Final   Special Requests NONE  Final   Culture   Final    NO GROWTH Performed at [facility], [place] [address], [place], [place] [address]    Report Status 10/22/2017 FINAL  Final  Body fluid culture     Status: None   Collection Time: 10/26/17 11:06 AM  Result Value Ref Range Status   Specimen Description PLEURAL RIGHT  Final   Special Requests NONE  Final   Gram Stain   Final    FEW WBC PRESENT,BOTH PMN AND MONONUCLEAR NO ORGANISMS SEEN    Culture   Final    NO GROWTH 3 DAYS Performed at [facility], [place] [address], [place], [place] [address]    Report Status 10/29/2017 FINAL  Final  Acid Fast Smear (AFB)     Status: None   Collection Time: 10/26/17 11:06 AM  Result Value Ref Range Status   AFB Specimen Processing Concentration  Final   Acid Fast Smear Negative  Final    Comment: (NOTE) Performed At: [facility] [place], [place] [id number] Rush Farmer MD [id number]    Source (AFB) PLEURAL  Final    Comment: RIGHT  Acid Fast Smear (AFB)     Status: None   Collection Time: 10/26/17 11:06 AM  Result Value Ref Range Status   AFB Specimen Processing Concentration  Final   Acid Fast Smear Negative  Final    Comment:  (NOTE) Performed At: [facility] [place], [place] [id number] Rush Farmer MD [id number]    Source (AFB) PLEURAL  Final      Radiology Studies: Ct Chest Wo Contrast  Result Date: 10/30/2017 CLINICAL DATA:  Pleural effusion. Shortness of breath. History of gist tumor with liver metastases EXAM: CT CHEST WITHOUT CONTRAST TECHNIQUE: Multidetector CT imaging of the chest was performed following the standard protocol without IV contrast. COMPARISON:  10/18/2017 FINDINGS: Cardiovascular: There is moderate cardiac enlargement. Aortic atherosclerosis identified. Calcification in the left main coronary artery, LAD, left circumflex and RCA noted. No pericardial effusion. Mediastinum/Nodes: The trachea appears patent and is midline. Patulous  of esophagus appears increased in caliber. Low-attenuation nodule in the left lobe of thyroid gland measures 2 cm, image 25 of series 2. No mediastinal or hilar adenopathy. No supraclavicular or axillary adenopathy. Lungs/Pleura: Moderate to large right pleural effusion. Small left effusion. There is compressive type atelectasis and consolidation involving the entire right lower lobe and portions of the right middle lobe. There is also compressive type atelectasis/ consolidation within the left base. There is a 5 mm subpleural nodule within the lingula. Upper Abdomen: No acute abnormality identified. Pneumobilia is again noted compatible with biliary patency. There is a large ventral abdominal wall hernia which contains nonobstructed loops of large bowel. Musculoskeletal: Degenerative disc disease is identified throughout the thoracic spine. Advanced osteoarthritis involves both glenohumeral joints. IMPRESSION: 1. Moderate to large right pleural effusion. There is associated decreased aeration to the right middle lobe and entire right lower lobe. 2. Small left effusion. 3. Lingular nodule measures 5 mm. No follow-up needed if patient is low-risk.  Non-contrast chest CT can be considered in 12 months if patient is high-risk. This recommendation follows the consensus statement: Guidelines for Management of Incidental Pulmonary Nodules Detected on CT Images: From the Fleischner Society 2017; Radiology 2017; 284:228-243. 4. Aortic Atherosclerosis (ICD10-I70.0). Three vessel coronary artery calcifications including left main disease. 5. Large ventral abdominal wall hernia 6. Pneumobilia. Electronically Signed   By: Kerby Moors M.D.   On: 10/30/2017 13:23   Dg Chest Port 1 View  Result Date: 10/29/2017 CLINICAL DATA:  83 year old female with a history of pleural effusion EXAM: PORTABLE CHEST 1 VIEW COMPARISON:  11/26/2016, 10/24/2017 FINDINGS: Cardiomediastinal silhouette unchanged with cardiomegaly. Calcifications of the aortic arch. Recurrence of peripheral basilar opacity on the right with obscuration the right hemidiaphragm and veiled opacity at the right lung base. Thickening of the minor fissure. No pneumothorax. Opacity at the left base with blunting of the left costophrenic angle. No pneumothorax. Surgical changes of the neck IMPRESSION: Recurrent right-sided pleural effusion, potentially partially loculated. Opacity at the left base persists, likely small pleural effusion. Cardiomegaly and aortic calcifications. Electronically Signed   By: Corrie Mckusick D.O.   On: 10/29/2017 15:18    Scheduled Meds: . docusate sodium  100 mg Oral BID  . donepezil  10 mg Oral QHS  . enoxaparin (LOVENOX) injection  40 mg Subcutaneous Q24H  . fluticasone  1 spray Each Nare Daily  . furosemide  40 mg Intravenous BID  . ipratropium-albuterol  3 mL Nebulization TID  . mirabegron ER  50 mg Oral QHS  . vitamin B-12  1,000 mcg Oral BID   Continuous Infusions: . meropenem (MERREM) IV Stopped (10/30/17 0939)     LOS: 17 days   Time spent: 35 minutes.  Vance Gather, MD [facility] Pager [phone]  If 7PM-7AM, please contact  night-coverage [email or web address] Password [email or web address] 10/30/2017, 1:48 PM

## 2017-10-30 NOTE — Progress Notes (Signed)
PULMONARY / CRITICAL CARE MEDICINE   Name: Melanie Trevino MRN: [id number] DOB: 1927/11/25    ADMISSION DATE:  10/13/2017 CONSULTATION DATE:  10/18/2017  REFERRING MD:  Dr. Sheran Fava, [facility]  CHIEF COMPLAINT:  Short of breath  HISTORY OF PRESENT ILLNESS:   83 yo female with dyspnea, cough, chills, hypoxia from pneumonia and pleural effusion.  She has hx of of dementia, wheelchair bound, and needs assistance with ADLs.  SUBJECTIVE:   Family reports she still has cough with clear sputum.  VITAL SIGNS: BP (!) 111/46 (BP Location: Left Arm)   Pulse 60   Temp 98.3 F (36.8 C) (Oral)   Resp 18   Ht 5\' 4"  (1.626 m)   Wt 176 lb 11.2 oz (80.2 kg)   SpO2 97%   BMI 30.33 kg/m   INTAKE / OUTPUT: I/O last 3 completed shifts: In: 580 [P.O.:480; IV Piggyback:100] Out: 1800 [Urine:1800]  PHYSICAL EXAMINATION:  General - pleasant Eyes - pupils reactive ENT - no sinus tenderness, no oral exudate, no LAN Cardiac - regular, no murmur Chest - decreased BS Rt base Abd - soft, non tender Ext - no edema Skin - no rashes Neuro - follows commands   LABS:  BMET Recent Labs  Lab 10/28/17 0455 10/29/17 0511 10/30/17 0517  NA 141 141 145  K 3.2* 3.6 3.2*  CL 101 100* 102  CO2 36* 36* 36*  BUN 21* 21* 25*  CREATININE 0.68 0.63 0.75  GLUCOSE 93 95 96    Electrolytes Recent Labs  Lab 10/28/17 0455 10/29/17 0511 10/30/17 0517  CALCIUM 7.7* 7.8* 8.2*    CBC Recent Labs  Lab 10/25/17 0559 10/27/17 1733  WBC 5.1  --   HGB 9.7* 9.9*  HCT 30.7* 31.0*  PLT 184  --     Imaging Ct Chest Wo Contrast  Result Date: 10/30/2017 CLINICAL DATA:  Pleural effusion. Shortness of breath. History of gist tumor with liver metastases EXAM: CT CHEST WITHOUT CONTRAST TECHNIQUE: Multidetector CT imaging of the chest was performed following the standard protocol without IV contrast. COMPARISON:  10/18/2017 FINDINGS: Cardiovascular: There is moderate cardiac enlargement. Aortic atherosclerosis  identified. Calcification in the left main coronary artery, LAD, left circumflex and RCA noted. No pericardial effusion. Mediastinum/Nodes: The trachea appears patent and is midline. Patulous of esophagus appears increased in caliber. Low-attenuation nodule in the left lobe of thyroid gland measures 2 cm, image 25 of series 2. No mediastinal or hilar adenopathy. No supraclavicular or axillary adenopathy. Lungs/Pleura: Moderate to large right pleural effusion. Small left effusion. There is compressive type atelectasis and consolidation involving the entire right lower lobe and portions of the right middle lobe. There is also compressive type atelectasis/ consolidation within the left base. There is a 5 mm subpleural nodule within the lingula. Upper Abdomen: No acute abnormality identified. Pneumobilia is again noted compatible with biliary patency. There is a large ventral abdominal wall hernia which contains nonobstructed loops of large bowel. Musculoskeletal: Degenerative disc disease is identified throughout the thoracic spine. Advanced osteoarthritis involves both glenohumeral joints. IMPRESSION: 1. Moderate to large right pleural effusion. There is associated decreased aeration to the right middle lobe and entire right lower lobe. 2. Small left effusion. 3. Lingular nodule measures 5 mm. No follow-up needed if patient is low-risk. Non-contrast chest CT can be considered in 12 months if patient is high-risk. This recommendation follows the consensus statement: Guidelines for Management of Incidental Pulmonary Nodules Detected on CT Images: From the Fleischner Society 2017; Radiology 2017; 284:228-243.  4. Aortic Atherosclerosis (ICD10-I70.0). Three vessel coronary artery calcifications including left main disease. 5. Large ventral abdominal wall hernia 6. Pneumobilia. Electronically Signed   By: Kerby Moors M.D.   On: 10/30/2017 13:23     STUDIES:  Echo 12/12 >> mod LVH, EF 60 to 65%, grade 1 DD, PAS 36  mmHg Rt thoracentesis 12/12 >> 1.8 liters, glucose 104, protein < 3, LDH 190, WBC 680 (79%M), reactive mesothelial cells CT chest 12/16 >> atherosclerosis, mod b/l effusions Rt > Lt, patchy ASD in Rt lung SLP Eval 12/16 >> rec's for regular diet / thin liquids on clinical exam, consider MBS MBS 12/18 >> no evidence of aspiration Rt thoracentesis 12/24 >> 2 liters, glucose 114, protein < 3, LDH 195, WBC 1684 (74%N), reactive mesothelial cells CT chest 12/28 >> mod/large Rt effusion, small Lt effusion, compressive ATX on Rt, 5 mm nodule lingula   CULTURES: Blood 12/11 >> negative Rt pleural fluid 12/12 >> negative Rt pleural fluid 12/24 >> negative  ANTIBIOTICS: Zithromax 12/11 >> 12/11 Rocephin 12/11 >> 12/11 Levaquin 12/12 >> 12/16 Rocephin 12/16 >> 12/19 Flagyl 12/16 >> 12/20 Meropenem 12/20 >> 12/28   SIGNIFICANT EVENTS: 12/11 Admit 12/16 consult speech and palliative care  DISCUSSION: 83 yo female likely with aspiration pneumonia and pleural effusion.  She has baseline dementia, wheelchair bound, and needs assistance with ADLs.  Of note is that she has been on imatinib as an outpt and this can cause pleural effusion.  She has not received this during this admission.   ASSESSMENT / PLAN:  Recurrent Rt pleural effusion. - will ask IR to arrange for repeat thoracentesis - would like to avoid indwelling catheter if possible  Aspiration pneumonia. - has completed lengthy course of Abx  DVT prophylaxis - lovenox SUP - not indicated Nutrition - heart healthy Goals of care - DNR  Updated family at bedside  Chesley Mires, MD [place] 10/30/2017, 4:21 PM Pager:  [phone] After 3pm call: [phone]

## 2017-10-31 ENCOUNTER — Inpatient Hospital Stay (HOSPITAL_COMMUNITY): Payer: Medicare Other

## 2017-10-31 LAB — CBC
HEMATOCRIT: 31.3 % — AB (ref 36.0–46.0)
Hemoglobin: 9.6 g/dL — ABNORMAL LOW (ref 12.0–15.0)
MCH: 30 pg (ref 26.0–34.0)
MCHC: 30.7 g/dL (ref 30.0–36.0)
MCV: 97.8 fL (ref 78.0–100.0)
PLATELETS: 220 10*3/uL (ref 150–400)
RBC: 3.2 MIL/uL — AB (ref 3.87–5.11)
RDW: 15.3 % (ref 11.5–15.5)
WBC: 3.2 10*3/uL — AB (ref 4.0–10.5)

## 2017-10-31 LAB — BASIC METABOLIC PANEL
ANION GAP: 6 (ref 5–15)
BUN: 25 mg/dL — AB (ref 6–20)
CHLORIDE: 100 mmol/L — AB (ref 101–111)
CO2: 36 mmol/L — AB (ref 22–32)
Calcium: 8 mg/dL — ABNORMAL LOW (ref 8.9–10.3)
Creatinine, Ser: 0.85 mg/dL (ref 0.44–1.00)
GFR calc Af Amer: >60 mL/min (ref >60)
GFR, EST NON AFRICAN AMERICAN: 60 mL/min — AB (ref >60)
GLUCOSE: 90 mg/dL (ref 65–99)
POTASSIUM: 3 mmol/L — AB (ref 3.5–5.1)
Sodium: 142 mmol/L (ref 135–145)

## 2017-10-31 MED ORDER — FUROSEMIDE 40 MG PO TABS
40.0000 mg | ORAL_TABLET | Freq: Every day | ORAL | Status: DC
  Administered 2017-11-01 – 2017-11-06 (×6): 40 mg via ORAL
  Filled 2017-10-31 (×7): qty 1

## 2017-10-31 MED ORDER — POTASSIUM CHLORIDE CRYS ER 20 MEQ PO TBCR
40.0000 meq | EXTENDED_RELEASE_TABLET | Freq: Every day | ORAL | Status: DC
  Administered 2017-10-31 – 2017-11-07 (×8): 40 meq via ORAL
  Filled 2017-10-31 (×8): qty 2

## 2017-10-31 MED ORDER — LIDOCAINE HCL 1 % IJ SOLN
INTRAMUSCULAR | Status: AC
  Filled 2017-10-31: qty 20

## 2017-10-31 MED ORDER — IPRATROPIUM-ALBUTEROL 0.5-2.5 (3) MG/3ML IN SOLN
3.0000 mL | Freq: Two times a day (BID) | RESPIRATORY_TRACT | Status: DC
  Administered 2017-11-01 – 2017-11-07 (×12): 3 mL via RESPIRATORY_TRACT
  Filled 2017-10-31 (×15): qty 3

## 2017-10-31 NOTE — Progress Notes (Signed)
PROGRESS NOTE  Melanie Trevino  ERX:[id number] DOB: 03/17/1928 DOA: 10/13/2017 PCP: Wenda Low, MD   Brief Narrative: Melanie Trevino is an 83 y.o. 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 2u PRBCs were transfused and thoracentesis 12/12 yielded 1.8L dark fluid found to be exudative with negative cytology and culture. She was started on levaquin and diuresed but effusion recurred. Modified barium swallow demonstrated no evidence of aspiration but CT chest demonstrated bilateralpneumonia, with fluid in the airways suggesting aspiration. Her antibiotics were changed to cover aspiration pneumonia on 12/16. Pulmonology and palliative care were consulted. Repeat thoracentesis yielded 2L bloody fluid with follow up CXR demonstrating ongoing right effusion possibly with loculations. This provided symptomatic relief. Palliative care team is discussing goal of care with patient's family. Option of chest tube (Pleurx catheter) broached if pleural effusion continues to reoccur.  Assessment & Plan: Principal Problem:   Pleural effusion Active Problems:   Breast cancer (HCC)   Dementia of the Alzheimer's type   Gastrointestinal stromal tumor (GIST) (HCC)   Wheezing   Pressure injury of skin  Acute hypoxic respiratory failure due to large recurrent right-sided pleural effusion: Exudative with negative cytology and culture on initial thoracentesis 12/12. Repeat thora 12/24 w/2L out and symptomatically improved, follow up CXR showed reaccumulation. There has been suspicion for aspiration pneumonia/pneumonitis, though SLP evaluation appeared to show no aspiration.  - Discussed with pulmonology, Dr. Halford Chessman, who recommended repeat CT chest 12/28 which showed reaccumulation. Recommended repeat thoracentesis which was performed  12/29. Hoping to avoid pleurx catheter. -Completed abx: ceftriaxone + flagyl stopped 12/20, meropenem 12/20 - 12/28. - Palliative care and pulmonology assistance appreciated.   Acute on chronic diastolic heart failure: Repeat echo 12/12 showed improved EF to 60-65% with elevated right and left heart pressures, G1DD, no regional WMA's.  - De-escalate to lasix 40mg  po daily - Continue heart healthy diet - Dtrict I/O, daily weights.   Pneumobilia: incidentally noted on CT. Asymptomatic without evidence of abdominal infection/necrosis.  - Monitor clinically  Anemia due to chronic disease: FOBT neg. s/p 2u PRBCs 12/11 has remained stable. Ferritin wnl at 160.  - Monitor CBC intermittently to avoid iatrogenic anemia.   Demand ischemia due to anemia and pleural effusion: Asymptomatic. Troponin downtrended 0.67 >> 0.15. EKG is negative for any acute ST-T changes. No regional wall motion abnormalities on echo. - Monitor clinically  Paroxysmal atrial fibrillation: Currently NSR, borderline bradycardic without rate-control agent.  - No anticoagulation due to fall risk, thrombocytopenia.   Hypokalemia: Due to lasix.  - Replace and recheck.  Essential hypertension:  - Currently hypotensive, so stoppedlosartan.  Alzheimer's dementia: Chronic, stable - Continue aricept - Continuing to be in contact with family for medical decision-making.  GIST with liver metastases: follows at [facility]. - Imatinib has been held since admission, known to cause pleural effusions.   History of breast cancer status post mastectomy: in remission  Stage 1 pressure ulcer on sacrum:  - Offloading as able, allevyn.  DVT prophylaxis: Lovenox Code Status: DNR Family Communication: Discussed with son and 2 DIL's at bedside Disposition Plan: Uncertain at this time.  Consultants:   Palliative care  Pulmonology  Procedures:   Thoracentesis 12/12 and 12/24.   Echocardiogram 10/14/2017: - Left  ventricle: The cavity size was normal. Wall thickness was   increased in  a pattern of moderate LVH. Systolic function was   normal. The estimated ejection fraction was in the range of 60%   to 65%. Wall motion was normal; there were no regional wall   motion abnormalities. Doppler parameters are consistent with   abnormal left ventricular relaxation (grade 1 diastolic   dysfunction). The E/e&' ratio is >15, suggesting elevated LV   filling pressure. - Left atrium: The atrium was normal in size. - Right atrium: Moderately dilated. - Pulmonary arteries: PA peak pressure: 36 mm Hg (S). - Systemic veins: The IVC measures <2.1 cm, but does not collapse   >50%, suggesting an elevated RA pressure of 8 mmHg. - Pericardium, extracardiac: Small pericardial effusion. No   tamponade physiology. There was a right pleural effusion.  Impressions: - Compared to a prior study in 2017, sinus rhythm is present. The   LVEF is higher at 60-65%, however, there is grade 1 DD with   elevated left and right heart filling pressures, a small   pericardial effusion without tamponade physiology is noted along   with a right pleural effusion.  Antimicrobials:  Ceftriaxone, flagyl  Meropenem 12/20 - 12/28   Subjective: Does not retain information. When discussed need for thoracentesis, continually asked when the last one was, didn't remember it. DIL's at bedside report she's stable since yesterday, actually appears to have no dyspnea currently. No complaints of pain currently, but they want her up in the chair and anticipate she'll complain of pain in her bottom.   Objective: BP (!) 111/53 (BP Location: Left Arm)   Pulse (!) 53   Temp 98.1 F (36.7 C) (Oral)   Resp 18   Ht 5\' 4"  (1.626 m)   Wt 84.6 kg (186 lb 9.6 oz)   SpO2 96%   BMI 32.03 kg/m   Gen: Elderly, pleasantly confused female in no distress Pulm: Non-labored breathing 2L. Diminished at lower and midlung zones bilaterally R > L. No crackles  or wheezes.  CV: Regular rate and rhythm. No murmur, rub, or gallop. No JVD, trace pedal edema. GI: Abdomen soft, non-tender, non-distended, with normoactive bowel sounds. No organomegaly or masses felt. Ext: Warm, no deformities Skin: Stage I sacral pressure ulcer Neuro: Alert, not oriented. No focal neurological deficits. Psych: Judgement and insight appear normal. Mood & affect appropriate.   CBC: Recent Labs  Lab 10/25/17 0559 10/27/17 1733 10/31/17 0559  WBC 5.1  --  3.2*  HGB 9.7* 9.9* 9.6*  HCT 30.7* 31.0* 31.3*  MCV 97.5  --  97.8  PLT 184  --  423   Basic Metabolic Panel: Recent Labs  Lab 10/27/17 0526 10/28/17 0455 10/29/17 0511 10/30/17 0517 10/31/17 0559  NA 142 141 141 145 142  K 3.3* 3.2* 3.6 3.2* 3.0*  CL 101 101 100* 102 100*  CO2 36* 36* 36* 36* 36*  GLUCOSE 95 93 95 96 90  BUN 19 21* 21* 25* 25*  CREATININE 0.62 0.68 0.63 0.75 0.85  CALCIUM 7.7* 7.7* 7.8* 8.2* 8.0*   GFR: Estimated Creatinine Clearance: 49.1 mL/min (by C-G formula based on SCr of 0.85 mg/dL).  Urine analysis:    Component Value Date/Time   COLORURINE YELLOW 10/20/2017 1730   APPEARANCEUR HAZY (A) 10/20/2017 1730   LABSPEC 1.019 10/20/2017 1730   PHURINE 5.0 10/20/2017 1730   GLUCOSEU NEGATIVE 10/20/2017 1730   HGBUR NEGATIVE 10/20/2017 1730   BILIRUBINUR NEGATIVE 10/20/2017 1730   BILIRUBINUR negative 04/05/2016 [place] 10/20/2017 1730   PROTEINUR NEGATIVE  10/20/2017 1730   UROBILINOGEN 0.2 04/05/2016 1414   UROBILINOGEN 1.0 07/07/2015 1901   NITRITE NEGATIVE 10/20/2017 1730   LEUKOCYTESUR TRACE (A) 10/20/2017 1730   Recent Results (from the past 240 hour(s))  Body fluid culture     Status: None   Collection Time: 10/26/17 11:06 AM  Result Value Ref Range Status   Specimen Description PLEURAL RIGHT  Final   Special Requests NONE  Final   Gram Stain   Final    FEW WBC PRESENT,BOTH PMN AND MONONUCLEAR NO ORGANISMS SEEN    Culture   Final    NO  GROWTH 3 DAYS Performed at [facility], [place] [address], [place], [place] [address]    Report Status 10/29/2017 FINAL  Final  Acid Fast Smear (AFB)     Status: None   Collection Time: 10/26/17 11:06 AM  Result Value Ref Range Status   AFB Specimen Processing Concentration  Final   Acid Fast Smear Negative  Final    Comment: (NOTE) Performed At: [facility] [place], [place] [id number] Rush Farmer MD TD:[id number]    Source (AFB) PLEURAL  Final    Comment: RIGHT  Acid Fast Smear (AFB)     Status: None   Collection Time: 10/26/17 11:06 AM  Result Value Ref Range Status   AFB Specimen Processing Concentration  Final   Acid Fast Smear Negative  Final    Comment: (NOTE) Performed At: [facility] [address] [place], [place] [id number] Rush Farmer MD OE:[id number]    Source (AFB) PLEURAL  Final      Radiology Studies: Dg Chest 1 View  Result Date: 10/31/2017 CLINICAL DATA:  Status post thoracentesis. EXAM: CHEST 1 VIEW COMPARISON:  10/29/2017 FINDINGS: Stable cardiac enlargement. Aortic atherosclerosis. Decrease in volume of right pleural effusion status post thoracentesis. No pneumothorax identified. Atelectasis noted in the lung bases. There is advanced degenerative changes involving both glenohumeral joints. IMPRESSION: 1. No pneumothorax status post right thoracentesis. Electronically Signed   By: Kerby Moors M.D.   On: 10/31/2017 12:19   Ct Chest Wo Contrast  Result Date: 10/30/2017 CLINICAL DATA:  Pleural effusion. Shortness of breath. History of gist tumor with liver metastases EXAM: CT CHEST WITHOUT CONTRAST TECHNIQUE: Multidetector CT imaging of the chest was performed following the standard protocol without IV contrast. COMPARISON:  10/18/2017 FINDINGS: Cardiovascular: There is moderate cardiac enlargement. Aortic atherosclerosis identified. Calcification in the left main coronary artery, LAD, left circumflex and RCA noted.  No pericardial effusion. Mediastinum/Nodes: The trachea appears patent and is midline. Patulous of esophagus appears increased in caliber. Low-attenuation nodule in the left lobe of thyroid gland measures 2 cm, image 25 of series 2. No mediastinal or hilar adenopathy. No supraclavicular or axillary adenopathy. Lungs/Pleura: Moderate to large right pleural effusion. Small left effusion. There is compressive type atelectasis and consolidation involving the entire right lower lobe and portions of the right middle lobe. There is also compressive type atelectasis/ consolidation within the left base. There is a 5 mm subpleural nodule within the lingula. Upper Abdomen: No acute abnormality identified. Pneumobilia is again noted compatible with biliary patency. There is a large ventral abdominal wall hernia which contains nonobstructed loops of large bowel. Musculoskeletal: Degenerative disc disease is identified throughout the thoracic spine. Advanced osteoarthritis involves both glenohumeral joints. IMPRESSION: 1. Moderate to large right pleural effusion. There is associated decreased aeration to the right middle lobe and entire right lower lobe. 2. Small left effusion. 3. Lingular nodule measures  5 mm. No follow-up needed if patient is low-risk. Non-contrast chest CT can be considered in 12 months if patient is high-risk. This recommendation follows the consensus statement: Guidelines for Management of Incidental Pulmonary Nodules Detected on CT Images: From the Fleischner Society 2017; Radiology 2017; 284:228-243. 4. Aortic Atherosclerosis (ICD10-I70.0). Three vessel coronary artery calcifications including left main disease. 5. Large ventral abdominal wall hernia 6. Pneumobilia. Electronically Signed   By: Kerby Moors M.D.   On: 10/30/2017 13:23    Scheduled Meds: . docusate sodium  100 mg Oral BID  . donepezil  10 mg Oral QHS  . enoxaparin (LOVENOX) injection  40 mg Subcutaneous Q24H  . fluticasone  1 spray  Each Nare Daily  . furosemide  40 mg Intravenous Daily  . ipratropium-albuterol  3 mL Nebulization TID  . lidocaine      . mirabegron ER  50 mg Oral QHS  . vitamin B-12  1,000 mcg Oral BID    LOS: 18 days   Time spent: 35 minutes.  Vance Gather, MD [facility] Pager [phone]  If 7PM-7AM, please contact night-coverage [email or web address] Password [facility] 10/31/2017, 4:37 PM

## 2017-11-01 DIAGNOSIS — R05 Cough: Secondary | ICD-10-CM

## 2017-11-01 DIAGNOSIS — E871 Hypo-osmolality and hyponatremia: Secondary | ICD-10-CM

## 2017-11-01 DIAGNOSIS — Z9889 Other specified postprocedural states: Secondary | ICD-10-CM

## 2017-11-01 LAB — MAGNESIUM: Magnesium: 2.1 mg/dL (ref 1.7–2.4)

## 2017-11-01 LAB — BASIC METABOLIC PANEL
ANION GAP: 6 (ref 5–15)
BUN: 27 mg/dL — ABNORMAL HIGH (ref 6–20)
CALCIUM: 8.1 mg/dL — AB (ref 8.9–10.3)
CO2: 35 mmol/L — ABNORMAL HIGH (ref 22–32)
Chloride: 102 mmol/L (ref 101–111)
Creatinine, Ser: 0.7 mg/dL (ref 0.44–1.00)
GFR calc Af Amer: >60 mL/min (ref >60)
GLUCOSE: 92 mg/dL (ref 65–99)
Potassium: 3.5 mmol/L (ref 3.5–5.1)
SODIUM: 143 mmol/L (ref 135–145)

## 2017-11-01 NOTE — Procedures (Signed)
PROCEDURE SUMMARY:  Successful US guided right thoracentesis. Yielded 1.5 liters of bloody fluid. Pt tolerated procedure well. No immediate complications.  Post procedure chest X-ray reveals no pneumothorax  Dorothia Passmore S Florice Hindle PA-C 11/01/2017 10:33 AM

## 2017-11-01 NOTE — Progress Notes (Signed)
PROGRESS NOTE  Melanie Trevino  XBJ:[id number] DOB: Aug 19, 1928 DOA: 10/13/2017 PCP: Wenda Low, MD   Brief Narrative: Melanie Trevino is an 83 y.o. 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 2u PRBCs were transfused and thoracentesis 12/12 yielded 1.8L dark fluid found to be exudative with negative cytology and culture. She was started on levaquin and diuresed but effusion recurred. Modified barium swallow demonstrated no evidence of aspiration but CT chest demonstrated bilateralpneumonia, with fluid in the airways suggesting aspiration. Her antibiotics were changed to cover aspiration pneumonia on 12/16. Pulmonology and palliative care were consulted. Repeat thoracentesis yielded 2L bloody fluid with follow up CXR demonstrating ongoing right effusion possibly with loculations. This provided symptomatic relief. Palliative care team is discussing goal of care with patient's family. Option of chest tube (Pleurx catheter) broached if pleural effusion continues to reoccur.  Assessment & Plan: Principal Problem:   Pleural effusion Active Problems:   Breast cancer (HCC)   Dementia of the Alzheimer's type   Gastrointestinal stromal tumor (GIST) (HCC)   Wheezing   Pressure injury of skin  Acute hypoxic respiratory failure due to large recurrent right-sided pleural effusion: Exudative with negative cytology and culture on initial thoracentesis 12/12. Repeat thora 12/24 w/2L out and symptomatically improved, follow up CXR showed reaccumulation. There has been suspicion for aspiration pneumonia/pneumonitis, though SLP evaluation appeared to show no aspiration.  - Repeat thoracentesis 1.5L 12/29 bloody fluid. Hoping to avoid pleurx catheter. -Completed abx: ceftriaxone + flagyl stopped 12/20, meropenem 12/20 - 12/28. -  Palliative care and pulmonology assistance appreciated.  - Repeat CXR 12/31 to assess effusion, pulm to follow up.   Acute on chronic diastolic heart failure: Repeat echo 12/12 showed improved EF to 60-65% with elevated right and left heart pressures, G1DD, no regional WMA's.  - Continue lasix 40mg  po daily - Continue heart healthy diet - Dtrict I/O, daily weights.   Pneumobilia: incidentally noted on CT. Asymptomatic without evidence of abdominal infection/necrosis.  - Monitor clinically  Anemia due to chronic disease: FOBT neg. s/p 2u PRBCs 12/11 has remained stable. Ferritin wnl at 160.  - Monitor CBC intermittently to avoid iatrogenic anemia.   Demand ischemia due to anemia and pleural effusion: Asymptomatic. Troponin downtrended 0.67 >> 0.15. EKG is negative for any acute ST-T changes. No regional wall motion abnormalities on echo. - Monitor clinically  Paroxysmal atrial fibrillation: Currently NSR, borderline bradycardic without rate-control agent.  - No anticoagulation due to fall risk, thrombocytopenia.   Hypokalemia: Due to lasix.  - Replace and recheck.  Essential hypertension:  - Currently hypotensive, so stoppedlosartan.  Alzheimer's dementia: Chronic, stable - Continue aricept - Continuing to be in contact with family for medical decision-making.  GIST with liver metastases: follows at [facility]. - Imatinib has been held since admission, known to cause pleural effusions. Will discuss with pt's oncologist, Dr. Kendall Flack 12/31 if available.   History of breast cancer status post mastectomy: in remission  Stage 1 pressure ulcer on sacrum:  - Offloading as able, allevyn.  DVT prophylaxis: Lovenox Code Status: DNR Family Communication: Discussed with DIL at bedside Disposition Plan: Uncertain at this time. Family seeking bed offers at other SNF's.   Consultants:   Palliative care  Pulmonology  Procedures:   Thoracentesis 12/12 and 12/24.    Echocardiogram 10/14/2017: - Left ventricle: The  cavity size was normal. Wall thickness was   increased in a pattern of moderate LVH. Systolic function was   normal. The estimated ejection fraction was in the range of 60%   to 65%. Wall motion was normal; there were no regional wall   motion abnormalities. Doppler parameters are consistent with   abnormal left ventricular relaxation (grade 1 diastolic   dysfunction). The E/e&' ratio is >15, suggesting elevated LV   filling pressure. - Left atrium: The atrium was normal in size. - Right atrium: Moderately dilated. - Pulmonary arteries: PA peak pressure: 36 mm Hg (S). - Systemic veins: The IVC measures <2.1 cm, but does not collapse   >50%, suggesting an elevated RA pressure of 8 mmHg. - Pericardium, extracardiac: Small pericardial effusion. No   tamponade physiology. There was a right pleural effusion.  Impressions: - Compared to a prior study in 2017, sinus rhythm is present. The   LVEF is higher at 60-65%, however, there is grade 1 DD with   elevated left and right heart filling pressures, a small   pericardial effusion without tamponade physiology is noted along   with a right pleural effusion.  Antimicrobials:  Ceftriaxone, flagyl  Meropenem 12/20 - 12/28   Subjective: Speaking on the phone with members of her church, eating when being urged on by family and caregivers. Still with some shortness of breath on transfer per staff.   Objective: BP (!) 131/53 (BP Location: Left Arm)   Pulse 67   Temp 98.3 F (36.8 C) (Oral)   Resp 18   Ht 5\' 4"  (1.626 m)   Wt 84.6 kg (186 lb 9.6 oz)   SpO2 98%   BMI 32.03 kg/m   Gen: Elderly, pleasantly confused female in no distress Pulm: Non-labored breathing 2L. No wheezes or crackles. CV: Regular rate and rhythm. Trace pedal edema. Neuro: Alert, not oriented. No focal neurological deficits. Psych: Judgement and insight appear impaired due to cognitive impairment. Mood & affect  appropriate.   CBC: Recent Labs  Lab 10/27/17 1733 10/31/17 0559  WBC  --  3.2*  HGB 9.9* 9.6*  HCT 31.0* 31.3*  MCV  --  97.8  PLT  --  440   Basic Metabolic Panel: Recent Labs  Lab 10/28/17 0455 10/29/17 0511 10/30/17 0517 10/31/17 0559 11/01/17 0608  NA 141 141 145 142 143  K 3.2* 3.6 3.2* 3.0* 3.5  CL 101 100* 102 100* 102  CO2 36* 36* 36* 36* 35*  GLUCOSE 93 95 96 90 92  BUN 21* 21* 25* 25* 27*  CREATININE 0.68 0.63 0.75 0.85 0.70  CALCIUM 7.7* 7.8* 8.2* 8.0* 8.1*  MG  --   --   --   --  2.1   GFR: Estimated Creatinine Clearance: 52.2 mL/min (by C-G formula based on SCr of 0.7 mg/dL).  Urine analysis:    Component Value Date/Time   COLORURINE YELLOW 10/20/2017 1730   APPEARANCEUR HAZY (A) 10/20/2017 1730   LABSPEC 1.019 10/20/2017 1730   PHURINE 5.0 10/20/2017 1730   GLUCOSEU NEGATIVE 10/20/2017 1730   HGBUR NEGATIVE 10/20/2017 1730   BILIRUBINUR NEGATIVE 10/20/2017 1730   BILIRUBINUR negative 04/05/2016 1414   KETONESUR NEGATIVE 10/20/2017 1730   PROTEINUR NEGATIVE 10/20/2017 1730   UROBILINOGEN 0.2 04/05/2016 1414   UROBILINOGEN 1.0 07/07/2015 1901   NITRITE NEGATIVE 10/20/2017 1730   LEUKOCYTESUR TRACE (A) 10/20/2017 1730   Recent Results (from the past 240 hour(s))  Body fluid culture     Status: None  Collection Time: 10/26/17 11:06 AM  Result Value Ref Range Status   Specimen Description PLEURAL RIGHT  Final   Special Requests NONE  Final   Gram Stain   Final    FEW WBC PRESENT,BOTH PMN AND MONONUCLEAR NO ORGANISMS SEEN    Culture   Final    NO GROWTH 3 DAYS Performed at [facility], [address], [place], [place] [address]    Report Status 10/29/2017 FINAL  Final  Acid Fast Smear (AFB)     Status: None   Collection Time: 10/26/17 11:06 AM  Result Value Ref Range Status   AFB Specimen Processing Concentration  Final   Acid Fast Smear Negative  Final    Comment: (NOTE) Performed At: [facility] [place], [place] [id number] Rush Farmer MD OZ:[id number]    Source (AFB) PLEURAL  Final    Comment: RIGHT  Acid Fast Smear (AFB)     Status: None   Collection Time: 10/26/17 11:06 AM  Result Value Ref Range Status   AFB Specimen Processing Concentration  Final   Acid Fast Smear Negative  Final    Comment: (NOTE) Performed At: [facility] [address] [place], [place] [id number] Rush Farmer MD [id number]    Source (AFB) PLEURAL  Final      Radiology Studies: Dg Chest 1 View  Result Date: 10/31/2017 CLINICAL DATA:  Status post thoracentesis. EXAM: CHEST 1 VIEW COMPARISON:  10/29/2017 FINDINGS: Stable cardiac enlargement. Aortic atherosclerosis. Decrease in volume of right pleural effusion status post thoracentesis. No pneumothorax identified. Atelectasis noted in the lung bases. There is advanced degenerative changes involving both glenohumeral joints. IMPRESSION: 1. No pneumothorax status post right thoracentesis. Electronically Signed   By: Kerby Moors M.D.   On: 10/31/2017 12:19   US Thoracentesis Asp Pleural Space W/img Guide  Result Date: 11/01/2017 INDICATION: History of breast cancer, gastrointestinal stromal tumor, dyspnea, recurrent right pleural effusion. Request made for diagnostic and therapeutic right thoracentesis. EXAM: ULTRASOUND GUIDED RIGHT THORACENTESIS MEDICATIONS: 1% Lidocaine = 10 mL COMPLICATIONS: None immediate. PROCEDURE: An ultrasound guided thoracentesis was thoroughly discussed with the patient and questions answered. The benefits, risks, alternatives and complications were also discussed. The patient understands and wishes to proceed with the procedure. Written consent was obtained. Ultrasound was performed to localize and mark an adequate pocket of fluid in the right chest. The area was then prepped and draped in the normal sterile fashion. 1% Lidocaine was used for local anesthesia. Under ultrasound guidance a 6 Fr Safe-T-Centesis catheter  was introduced. Thoracentesis was performed. The catheter was removed and a dressing applied. FINDINGS: A total of approximately 1.5 Liters of bloody fluid was removed. IMPRESSION: Successful ultrasound guided right thoracentesis yielding 1.5 of pleural fluid. Post procedure chest X-ray shows no pneumothorax. Read by:  Gareth Eagle, PA-C Electronically Signed   By: Corrie Mckusick D.O.   On: 11/01/2017 10:32    Scheduled Meds: . docusate sodium  100 mg Oral BID  . donepezil  10 mg Oral QHS  . enoxaparin (LOVENOX) injection  40 mg Subcutaneous Q24H  . fluticasone  1 spray Each Nare Daily  . furosemide  40 mg Oral Daily  . ipratropium-albuterol  3 mL Nebulization BID  . mirabegron ER  50 mg Oral QHS  . potassium chloride  40 mEq Oral Daily  . vitamin B-12  1,000 mcg Oral BID    LOS: 19 days   Time spent: 25 minutes.  Vance Gather, MD [facility]  Pager [phone]  If 7PM-7AM, please contact night-coverage [email or web address] Password [email or web address] 11/01/2017, 1:12 PM

## 2017-11-01 NOTE — Progress Notes (Signed)
She had repeat thoracentesis 12/29.  PCCM will f/u on 12/31 after she has repeat chest xray.  Chesley Mires, MD [facility] Pulmonary/Critical Care 11/01/2017, 8:43 AM Pager:  [phone] After 3pm call: [phone]

## 2017-11-02 ENCOUNTER — Inpatient Hospital Stay (HOSPITAL_COMMUNITY): Payer: Medicare Other

## 2017-11-02 LAB — BASIC METABOLIC PANEL
Anion gap: 5 (ref 5–15)
BUN: 25 mg/dL — AB (ref 6–20)
CHLORIDE: 102 mmol/L (ref 101–111)
CO2: 35 mmol/L — ABNORMAL HIGH (ref 22–32)
Calcium: 8.2 mg/dL — ABNORMAL LOW (ref 8.9–10.3)
Creatinine, Ser: 0.64 mg/dL (ref 0.44–1.00)
GFR calc Af Amer: >60 mL/min (ref >60)
GFR calc non Af Amer: >60 mL/min (ref >60)
Glucose, Bld: 96 mg/dL (ref 65–99)
POTASSIUM: 3.5 mmol/L (ref 3.5–5.1)
SODIUM: 142 mmol/L (ref 135–145)

## 2017-11-02 MED ORDER — IMATINIB MESYLATE 400 MG PO TABS: 400.0000 mg | ORAL_TABLET | Freq: Every day | ORAL | Status: DC

## 2017-11-02 NOTE — Progress Notes (Signed)
Pts family aware that we need chemo medication from home since we do not carry it here. Family states that they will bring it in tomorrow. Advised family to give to the nurse so it can be sent to pharmacy.

## 2017-11-02 NOTE — Progress Notes (Signed)
Date: November 02, 2017 Jumaane Weatherford, BSN, RN3, CCM [phone] Chart and notes review for patient progress and needs. Will follow for case management and discharge needs. Next review date: [id number] 

## 2017-11-02 NOTE — Progress Notes (Signed)
Palliative care progress note Reason for consult: Goals of care in light of dementia, GIST, and current admission for pneumonia, recurrent pleural effusion.   Chart reviewed and discussed with bedside RN.  I met today briefly with Melanie Trevino (she is confused and does not understand complexities of her medical condition at this time).  I met outside the room with her daughter in law Melanie Trevino as well as her son. We reviewed the patient's current condition, her prognosis, her trajectory, her disposition choices.   BP (!) 135/58 (BP Location: Right Leg)   Pulse (!) 54   Temp 98.4 F (36.9 C) (Oral)   Resp 20   Ht _0  (1.626 m)   Wt 83.8 kg (184 lb 11.2 oz)   SpO2 96%   BMI 31.70 kg/m  Labs and imaging noted  Awake alert but suspicious and appears anxious at times S1 S2 Shallow clear breath sounds anteriorly, diminished both bases Abdomen soft Trace edema Dementia at baseline.  PPS 30-40%  I had another family meeting with the patient's son and daughter in law Melanie Trevino, dementia trajectory, patient's ongoing significant re accumulations of pleural effusions, her gradual progressive decline all discussed in detail.  Family continues to struggle with how best to support the patient and her wishes. She has caregivers at home. We discussed about home with hospice versus home with home health care versus SNF rehab with palliative care on discharge. Melanie Trevino will contiue discussions with her husband and the rest of the family regarding the best possible disposition.   Patient to have a repeat CXR soon, she might need a pig tail catheter if there is significant re accumulation.      Total time: 35 minutes Greater than 50%  of this time was spent counseling and coordinating care related to the above assessment and plan.  Loistine Chance, MD [place] Team [phone]

## 2017-11-02 NOTE — Progress Notes (Signed)
PROGRESS NOTE  Melanie Trevino  DXI:[id number] DOB: 09-24-1928 DOA: 10/13/2017 PCP: Wenda Low, MD   Brief Narrative: Melanie Trevino is an 83 y.o. 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 2u PRBCs were transfused and thoracentesis 12/12 yielded 1.8L dark fluid found to be exudative with negative cytology and culture. She was started on levaquin and diuresed but effusion recurred. Modified barium swallow demonstrated no evidence of aspiration but CT chest demonstrated bilateralpneumonia, with fluid in the airways suggesting aspiration. Her antibiotics were changed to cover aspiration pneumonia on 12/16. Pulmonology and palliative care were consulted. Repeat thoracentesis yielded 2L bloody fluid with follow up CXR demonstrating ongoing right effusion possibly with loculations. This provided symptomatic relief. Palliative care team is discussing goal of care with patient's family. Option of chest tube (Pleurx catheter) broached if pleural effusion continues to reoccur.  Assessment & Plan: Principal Problem:   Pleural effusion Active Problems:   Breast cancer (HCC)   Dementia of the Alzheimer's type   Gastrointestinal stromal tumor (GIST) (HCC)   Wheezing   Pressure injury of skin  Acute hypoxic respiratory failure due to large recurrent right-sided pleural effusion: Exudative with negative cytology and culture on initial thoracentesis 12/12. Repeat thora 12/24 w/2L out and symptomatically improved, follow up CXR showed reaccumulation. There has been suspicion for aspiration pneumonia/pneumonitis, though SLP evaluation appeared to show no aspiration.  - Repeat thoracentesis 1.5L 12/29 bloody fluid. -Completed abx: ceftriaxone + flagyl stopped 12/20, meropenem 12/20 - 12/28. - Palliative care and pulmonology  assistance appreciated.  - Repeat CXR to monitor for recurrence, need for pleurx. Has had thoracentesis x3 in 3 weeks.    Acute on chronic diastolic heart failure: Repeat echo 12/12 showed improved EF to 60-65% with elevated right and left heart pressures, G1DD, no regional WMA's.  - Continue lasix 40mg  po daily - Continue heart healthy diet - Dtrict I/O, daily weights. Overall weight far down from admission, euvolemic.   Pneumobilia: incidentally noted on CT. Asymptomatic without evidence of abdominal infection/necrosis.  - Monitor clinically  Anemia due to chronic disease: FOBT neg. s/p 2u PRBCs 12/11 has remained stable. Ferritin wnl at 160.  - Monitor CBC intermittently to avoid iatrogenic anemia.   Demand ischemia due to anemia and pleural effusion: Asymptomatic. Troponin downtrended 0.67 >> 0.15. EKG is negative for any acute ST-T changes. No regional wall motion abnormalities on echo. - Monitor clinically  Paroxysmal atrial fibrillation: Currently NSR, borderline bradycardic without rate-control agent.  - No anticoagulation due to fall risk, thrombocytopenia.   Hypokalemia: Due to lasix.  - Replace and recheck.  Essential hypertension:  - Currently hypotensive, so stoppedlosartan.  Alzheimer's dementia: Chronic, stable - Continue aricept - Continuing to be in contact with family for medical decision-making.  GIST with liver metastases: follows at [facility]. - Imatinib has been held since admission, known to cause pleural effusions. Will discuss with pt's oncologist, Dr. Kendall Flack 12/31 if available.   History of breast cancer status post mastectomy: in remission  Stage 1 pressure ulcer on sacrum:  - Offloading as able, allevyn. Consult WOC.   Sinus bradycardia: Stable.  - Monitor. Not on offending medications. Would not want pacemaker. DNR confirmed.   DVT prophylaxis: Lovenox Code Status: DNR Family Communication: Discussed with DIL at bedside Disposition Plan:  Anticipate home with hospice, possibly with  pleurx catheter  Consultants:   Palliative care  Pulmonology  Procedures:   Thoracentesis 12/12 and 12/24.   Echocardiogram 10/14/2017: - Left ventricle: The cavity size was normal. Wall thickness was   increased in a pattern of moderate LVH. Systolic function was   normal. The estimated ejection fraction was in the range of 60%   to 65%. Wall motion was normal; there were no regional wall   motion abnormalities. Doppler parameters are consistent with   abnormal left ventricular relaxation (grade 1 diastolic   dysfunction). The E/e&' ratio is >15, suggesting elevated LV   filling pressure. - Left atrium: The atrium was normal in size. - Right atrium: Moderately dilated. - Pulmonary arteries: PA peak pressure: 36 mm Hg (S). - Systemic veins: The IVC measures <2.1 cm, but does not collapse   >50%, suggesting an elevated RA pressure of 8 mmHg. - Pericardium, extracardiac: Small pericardial effusion. No   tamponade physiology. There was a right pleural effusion.  Impressions: - Compared to a prior study in 2017, sinus rhythm is present. The   LVEF is higher at 60-65%, however, there is grade 1 DD with   elevated left and right heart filling pressures, a small   pericardial effusion without tamponade physiology is noted along   with a right pleural effusion.  Antimicrobials:  Ceftriaxone, flagyl  Meropenem 12/20 - 12/28   Subjective: No events overnight. Caretaker reporting pain in her bottom and getting more raw/red.   Objective: BP (!) 115/49 (BP Location: Right Arm)   Pulse (!) 49   Temp 98.1 F (36.7 C) (Oral)   Resp 20   Ht 5\' 4"  (1.626 m)   Wt 83.8 kg (184 lb 11.2 oz)   SpO2 97%   BMI 31.70 kg/m   Gen: Elderly, pleasantly confused female in no distress Pulm: Non-labored breathing 2L. No wheezes or crackles, decreased at bases. CV: Regular rate and rhythm. Trace pedal edema. Skin: Lower legs with hyperpigmentation  bilaterally, thin skin throughout. Pt prefers not to have exam of her bottom.  Neuro: Alert, not oriented. No focal neurological deficits. Psych: Judgement and insight appear impaired due to cognitive impairment. Mood & affect appropriate.   CBC: Recent Labs  Lab 10/27/17 1733 10/31/17 0559  WBC  --  3.2*  HGB 9.9* 9.6*  HCT 31.0* 31.3*  MCV  --  97.8  PLT  --  322   Basic Metabolic Panel: Recent Labs  Lab 10/29/17 0511 10/30/17 0517 10/31/17 0559 11/01/17 0608 11/02/17 0552  NA 141 145 142 143 142  K 3.6 3.2* 3.0* 3.5 3.5  CL 100* 102 100* 102 102  CO2 36* 36* 36* 35* 35*  GLUCOSE 95 96 90 92 96  BUN 21* 25* 25* 27* 25*  CREATININE 0.63 0.75 0.85 0.70 0.64  CALCIUM 7.8* 8.2* 8.0* 8.1* 8.2*  MG  --   --   --  2.1  --    GFR: Estimated Creatinine Clearance: 51.9 mL/min (by C-G formula based on SCr of 0.64 mg/dL).  Urine analysis:    Component Value Date/Time   COLORURINE YELLOW 10/20/2017 1730   APPEARANCEUR HAZY (A) 10/20/2017 1730   LABSPEC 1.019 10/20/2017 1730   PHURINE 5.0 10/20/2017 1730   GLUCOSEU NEGATIVE 10/20/2017 1730   HGBUR NEGATIVE 10/20/2017 1730   BILIRUBINUR NEGATIVE 10/20/2017 1730   BILIRUBINUR negative 04/05/2016 1414   KETONESUR NEGATIVE 10/20/2017 1730   PROTEINUR NEGATIVE 10/20/2017 1730   UROBILINOGEN 0.2 04/05/2016 1414   UROBILINOGEN 1.0 07/07/2015 1901  NITRITE NEGATIVE 10/20/2017 1730   LEUKOCYTESUR TRACE (A) 10/20/2017 1730   Recent Results (from the past 240 hour(s))  Body fluid culture     Status: None   Collection Time: 10/26/17 11:06 AM  Result Value Ref Range Status   Specimen Description PLEURAL RIGHT  Final   Special Requests NONE  Final   Gram Stain   Final    FEW WBC PRESENT,BOTH PMN AND MONONUCLEAR NO ORGANISMS SEEN    Culture   Final    NO GROWTH 3 DAYS Performed at [facility], [address] [place], [place] [address]    Report Status 10/29/2017 FINAL  Final  Acid Fast Smear (AFB)     Status: None     Collection Time: 10/26/17 11:06 AM  Result Value Ref Range Status   AFB Specimen Processing Concentration  Final   Acid Fast Smear Negative  Final    Comment: (NOTE) Performed At: [facility] [place], [place] [id number] Rush Farmer MD [id number]    Source (AFB) PLEURAL  Final    Comment: RIGHT  Acid Fast Smear (AFB)     Status: None   Collection Time: 10/26/17 11:06 AM  Result Value Ref Range Status   AFB Specimen Processing Concentration  Final   Acid Fast Smear Negative  Final    Comment: (NOTE) Performed At: [facility] [address] [place], [place] [id number] Rush Farmer MD YW:[id number]    Source (AFB) PLEURAL  Final      Radiology Studies: Dg Chest Port 1 View  Result Date: 11/02/2017 CLINICAL DATA:  Follow-up right pleural effusion. EXAM: PORTABLE CHEST 1 VIEW COMPARISON:  Portable chest x-ray of October 31, 2017 FINDINGS: The lungs are reasonably well inflated. There are small bilateral pleural effusions which appears stable. There is no pneumothorax. The right perihilar and infrahilar regions remain dense. The cardiac silhouette is enlarged. The pulmonary vascularity is not clearly engorged. There is calcification in the wall of the thoracic aorta. There is multilevel degenerative disc disease of the thoracic spine. There is new moderate to severe degenerative change of both shoulders. IMPRESSION: No recurrent pneumothorax. Small bilateral pleural effusions, stable. Persistent right lower lobe atelectasis or pneumonia Stable cardiomegaly without significant pulmonary vascular congestion. Thoracic aortic atherosclerosis. Electronically Signed   By: David  Martinique M.D.   On: 11/02/2017 07:12    Scheduled Meds: . docusate sodium  100 mg Oral BID  . donepezil  10 mg Oral QHS  . enoxaparin (LOVENOX) injection  40 mg Subcutaneous Q24H  . fluticasone  1 spray Each Nare Daily  . furosemide  40 mg Oral Daily  . imatinib  400 mg Oral  Daily  . ipratropium-albuterol  3 mL Nebulization BID  . mirabegron ER  50 mg Oral QHS  . potassium chloride  40 mEq Oral Daily  . vitamin B-12  1,000 mcg Oral BID    LOS: 20 days   Time spent: 25 minutes.  Vance Gather, MD [facility] Pager [phone]  If 7PM-7AM, please contact night-coverage [email or web address] Password [email or web address] 11/02/2017, 3:53 PM

## 2017-11-02 NOTE — Progress Notes (Addendum)
STAFF NOTE: I, Dr Ann Lions have personally reviewed patient's available data, including medical history, events of note, physical examination and test results as part of my evaluation. I have discussed with resident/NP and other care providers such as pharmacist, RN and RRT.  In addition,  I personally evaluated patient and elicited key findings of   S: followup recurrent bloody exudative (per [facility]) right pleural effusion (non diag 10/14/17 and 10/26/17) in patient with ECOG 4, on imatinib for hx of GIST, wheel chair bound, DNR 83 year old. Prior breast cancer hx  Per chart review had 3rd thora yesterday. Care taker does not seem to know.APtient is confused.Marland Kitchen Results pending  O: elderly Frail In bed Confused but pleasant CTA bilaterally Edema + Normal heart sounds  . Recent Labs  Lab 10/27/17 1733 10/31/17 0559  HGB 9.9* 9.6*  HCT 31.0* 31.3*  WBC  --  3.2*  PLT  --  220   Recent Labs  Lab 10/29/17 0511 10/30/17 0517 10/31/17 0559 11/01/17 0608 11/02/17 0552  NA 141 145 142 143 142  K 3.6 3.2* 3.0* 3.5 3.5  CL 100* 102 100* 102 102  CO2 36* 36* 36* 35* 35*  GLUCOSE 95 96 90 92 96  BUN 21* 25* 25* 27* 25*  CREATININE 0.63 0.75 0.85 0.70 0.64  CALCIUM 7.8* 8.2* 8.0* 8.1* 8.2*  MG  --   --   --  2.1  --     Dg Chest Port 1 View  Result Date: 11/02/2017 CLINICAL DATA:  Follow-up right pleural effusion. EXAM: PORTABLE CHEST 1 VIEW COMPARISON:  Portable chest x-ray of October 31, 2017 FINDINGS: The lungs are reasonably well inflated. There are small bilateral pleural effusions which appears stable. There is no pneumothorax. The right perihilar and infrahilar regions remain dense. The cardiac silhouette is enlarged. The pulmonary vascularity is not clearly engorged. There is calcification in the wall of the thoracic aorta. There is multilevel degenerative disc disease of the thoracic spine. There is new moderate to severe degenerative change of both shoulders. IMPRESSION: No  recurrent pneumothorax. Small bilateral pleural effusions, stable. Persistent right lower lobe atelectasis or pneumonia Stable cardiomegaly without significant pulmonary vascular congestion. Thoracic aortic atherosclerosis. Electronically Signed   By: David  Martinique M.D.   On: 11/02/2017 07:12  Personally visualized   A: Recrrurent idiopathich right exudate effusion; one etiology could be imatinib whic has high incidence of anasarca . Other is breast cancer 12/01/01/17 without effusion recurrence but she has high pre-test prob for recurrence next several days to few months  P: expectant followup If effusion recurs, given ECOG 4 and DNR, recommend palliative PleurX by IR/CVTS Decision imatinib - > can be taken without consideration to pleural effusion by primary onc  pccm will sign off  D./w Dr Bonner Puna at bedsie   Dr. Brand Males, M.D., [facility].C.P Pulmonary and Critical Care Medicine Staff Physician [place] [facility] Pager: [phone], If no answer or between  15:00h - 7:00h: call [phone]  11/02/2017 9:47 AM

## 2017-11-03 ENCOUNTER — Inpatient Hospital Stay (HOSPITAL_COMMUNITY): Payer: Medicare Other

## 2017-11-03 LAB — BASIC METABOLIC PANEL
Anion gap: 4 — ABNORMAL LOW (ref 5–15)
BUN: 24 mg/dL — AB (ref 6–20)
CHLORIDE: 103 mmol/L (ref 101–111)
CO2: 35 mmol/L — ABNORMAL HIGH (ref 22–32)
CREATININE: 0.85 mg/dL (ref 0.44–1.00)
Calcium: 8.2 mg/dL — ABNORMAL LOW (ref 8.9–10.3)
GFR calc Af Amer: >60 mL/min (ref >60)
GFR calc non Af Amer: 60 mL/min — ABNORMAL LOW (ref >60)
Glucose, Bld: 96 mg/dL (ref 65–99)
Potassium: 3.9 mmol/L (ref 3.5–5.1)
SODIUM: 142 mmol/L (ref 135–145)

## 2017-11-03 MED ORDER — ENOXAPARIN SODIUM 40 MG/0.4ML ~~LOC~~ SOLN
40.0000 mg | SUBCUTANEOUS | Status: DC
  Filled 2017-11-03: qty 0.4

## 2017-11-03 NOTE — Progress Notes (Signed)
PROGRESS NOTE  ONESTI BONFIGLIO  KNL:[id number] DOB: 1927-12-15 DOA: 10/13/2017 PCP: Wenda Low, MD   Brief Narrative: Melanie Trevino is an 84 y.o. 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 2u PRBCs were transfused and thoracentesis 12/12 yielded 1.8L dark fluid found to be exudative with negative cytology and culture. She was started on levaquin and diuresed but effusion recurred. Modified barium swallow demonstrated no evidence of aspiration but CT chest demonstrated bilateralpneumonia, with fluid in the airways suggesting aspiration. Her antibiotics were changed to cover aspiration pneumonia on 12/16. Pulmonology and palliative care were consulted. Full course of antibiotics has been completed though pleural effusion continues to recur, requiring thoracentesis x3 and repeat imaging showing progressive effusion on 1/1. IR is consulted for pleur-x catheter placement.    Assessment & Plan: Principal Problem:   Pleural effusion Active Problems:   Breast cancer (HCC)   Dementia of the Alzheimer's type   Gastrointestinal stromal tumor (GIST) (HCC)   Wheezing   Pressure injury of skin  Acute hypoxic respiratory failure due to large recurrent idiopathic right-sided pleural effusion: Exudative with negative cytology and culture on initial thoracentesis 12/12. Repeat thora 12/24 w/2L out and symptomatically improved, follow up CXR showed reaccumulation. There has been suspicion for aspiration pneumonia/pneumonitis, though SLP evaluation appeared to show no aspiration.  - Repeat thoracentesis 1.5L 12/29 bloody fluid (#3 in 3 weeks). Repeat CXR demonstrating progression again, IR consulted for pleur-x.  -Completed abx: ceftriaxone + flagyl stopped 12/20, meropenem 12/20 - 12/28. - Palliative care and pulmonology  assistance appreciated.   Acute on chronic diastolic heart failure: Repeat echo 12/12 showed improved EF to 60-65% with elevated right and left heart pressures, G1DD, no regional WMA's.  - Continue lasix 40mg  po daily. Pt family planning on getting purewick external catheter for nighttime use at home.  - Continue heart healthy diet - Continue strict I/O, daily weights. Overall weight far down from admission, euvolemic.   Pneumobilia: incidentally noted on CT. Asymptomatic without evidence of abdominal infection/necrosis.  - Monitor clinically  Anemia due to chronic disease: FOBT neg. s/p 2u PRBCs 12/11 has remained stable. Ferritin wnl at 160.  - Monitor CBC intermittently to avoid iatrogenic anemia.   Demand ischemia due to anemia and pleural effusion: Asymptomatic. Troponin downtrended 0.67 >> 0.15. EKG is negative for any acute ST-T changes. No regional wall motion abnormalities on echo. - Monitor clinically  Paroxysmal atrial fibrillation: Currently NSR, borderline bradycardic without rate-control agent.  - No anticoagulation due to fall risk, thrombocytopenia.   Hypokalemia: Due to lasix.  - Resolved, will continue to recheck.  Essential hypertension:  - Currently hypotensive, so stoppedlosartan.  Alzheimer's dementia: Chronic, stable - Continue aricept - Continuing to be in contact with family for medical decision-making.  GIST with liver metastases: follows at [facility]. - Imatinib has been held since admission, known to cause pleural effusions. Unable to reach pt's oncologist, Dr. Kendall Flack on 12/31.   History of breast cancer status post mastectomy: in remission  Stage 1 pressure ulcer on sacrum:  - Offloading as able, allevyn. Consulted WOC. Continue purewick catheter.   Sinus bradycardia: Stable.  - Monitor. Not on offending medications. Would not want pacemaker. DNR confirmed.   DVT prophylaxis: Lovenox Code Status: DNR Family Communication: Discussed with  DIL Disposition Plan: Anticipate home with hospice with  pleurx catheter in next 2-3 days  Consultants:   Palliative care  Pulmonology  Procedures:   Thoracentesis 12/12 and 12/24.   Echocardiogram 10/14/2017: - Left ventricle: The cavity size was normal. Wall thickness was   increased in a pattern of moderate LVH. Systolic function was   normal. The estimated ejection fraction was in the range of 60%   to 65%. Wall motion was normal; there were no regional wall   motion abnormalities. Doppler parameters are consistent with   abnormal left ventricular relaxation (grade 1 diastolic   dysfunction). The E/e&' ratio is >15, suggesting elevated LV   filling pressure. - Left atrium: The atrium was normal in size. - Right atrium: Moderately dilated. - Pulmonary arteries: PA peak pressure: 36 mm Hg (S). - Systemic veins: The IVC measures <2.1 cm, but does not collapse   >50%, suggesting an elevated RA pressure of 8 mmHg. - Pericardium, extracardiac: Small pericardial effusion. No   tamponade physiology. There was a right pleural effusion.  Impressions: - Compared to a prior study in 2017, sinus rhythm is present. The   LVEF is higher at 60-65%, however, there is grade 1 DD with   elevated left and right heart filling pressures, a small   pericardial effusion without tamponade physiology is noted along   with a right pleural effusion.  Antimicrobials:  Ceftriaxone, flagyl  Meropenem 12/20 - 12/28   Subjective: Sat in chair a long time yesterday. Pt confused but without complaints today. Caretaker without overnight events to report.  Objective: BP (!) 142/57 (BP Location: Right Arm)   Pulse (!) 48   Temp 98.4 F (36.9 C) (Oral)   Resp 20   Ht 5\' 4"  (1.626 m)   Wt 83.3 kg (183 lb 9.6 oz)   SpO2 95%   BMI 31.51 kg/m   Gen: Elderly, pleasantly confused female in no distress Pulm: Non-labored breathing 2L. No wheezes or crackles, decreased at bases, worse on right and  worse from yesterday's exam. CV: Regular rate and rhythm. Trace pedal edema. Skin: Lower legs with hyperpigmentation bilaterally, thin skin throughout Neuro: Alert, not oriented. Right bell's palsy (upper and lower trigeminal distribution) noted which was previously erroneously not documented but has been present throughout my exams. Psych: Judgement and insight appear impaired due to cognitive impairment. Mood & affect appropriate.   CBC: Recent Labs  Lab 10/27/17 1733 10/31/17 0559  WBC  --  3.2*  HGB 9.9* 9.6*  HCT 31.0* 31.3*  MCV  --  97.8  PLT  --  222   Basic Metabolic Panel: Recent Labs  Lab 10/30/17 0517 10/31/17 0559 11/01/17 0608 11/02/17 0552 11/03/17 0600  NA 145 142 143 142 142  K 3.2* 3.0* 3.5 3.5 3.9  CL 102 100* 102 102 103  CO2 36* 36* 35* 35* 35*  GLUCOSE 96 90 92 96 96  BUN 25* 25* 27* 25* 24*  CREATININE 0.75 0.85 0.70 0.64 0.85  CALCIUM 8.2* 8.0* 8.1* 8.2* 8.2*  MG  --   --  2.1  --   --    GFR: Estimated Creatinine Clearance: 48.7 mL/min (by C-G formula based on SCr of 0.85 mg/dL).  Urine analysis:    Component Value Date/Time   COLORURINE YELLOW 10/20/2017 1730   APPEARANCEUR HAZY (A) 10/20/2017 1730   LABSPEC 1.019 10/20/2017 1730   PHURINE 5.0 10/20/2017 1730   GLUCOSEU NEGATIVE 10/20/2017 1730   HGBUR NEGATIVE 10/20/2017 [place] 10/20/2017 1730   BILIRUBINUR negative 04/05/2016  [place] 10/20/2017 1730   PROTEINUR NEGATIVE 10/20/2017 1730   UROBILINOGEN 0.2 04/05/2016 1414   UROBILINOGEN 1.0 07/07/2015 1901   NITRITE NEGATIVE 10/20/2017 1730   LEUKOCYTESUR TRACE (A) 10/20/2017 1730   Recent Results (from the past 240 hour(s))  Body fluid culture     Status: None   Collection Time: 10/26/17 11:06 AM  Result Value Ref Range Status   Specimen Description PLEURAL RIGHT  Final   Special Requests NONE  Final   Gram Stain   Final    FEW WBC PRESENT,BOTH PMN AND MONONUCLEAR NO ORGANISMS SEEN     Culture   Final    NO GROWTH 3 DAYS Performed at [facility], [place] [address], [place], [place] [address]    Report Status 10/29/2017 FINAL  Final  Acid Fast Smear (AFB)     Status: None   Collection Time: 10/26/17 11:06 AM  Result Value Ref Range Status   AFB Specimen Processing Concentration  Final   Acid Fast Smear Negative  Final    Comment: (NOTE) Performed At: [facility] [place], [place] [id number] Rush Farmer MD [id number]    Source (AFB) PLEURAL  Final    Comment: RIGHT  Acid Fast Smear (AFB)     Status: None   Collection Time: 10/26/17 11:06 AM  Result Value Ref Range Status   AFB Specimen Processing Concentration  Final   Acid Fast Smear Negative  Final    Comment: (NOTE) Performed At: [facility] [place], [place] [id number] Rush Farmer MD TO:[id number]    Source (AFB) PLEURAL  Final  Acid Fast Culture with reflexed sensitivities     Status: None   Collection Time: 10/26/17 11:06 AM  Result Value Ref Range Status   Acid Fast Culture REFERT  Final    Comment: (NOTE) Please refer to the following specimen for additional lab results. For AFB culture results refer to specimen number: [id number] Performed At: [facility] [place], [place] [id number] Rush Farmer MD KW:[id number]    Source of Sample PLEURAL  Final    Comment: RIGHT      Radiology Studies: Dg Chest Port 1 View  Result Date: 11/03/2017 CLINICAL DATA:  Pleural effusion EXAM: PORTABLE CHEST 1 VIEW COMPARISON:  11/02/2017 FINDINGS: Progression of bilateral pleural effusions right greater than left. Progression of bibasilar airspace disease which may be atelectasis or pneumonia. No definite edema. Atherosclerotic aorta. Cardiac enlargement. Advanced degenerative change in both shoulders IMPRESSION: Progressive bilateral pleural effusion and bibasilar airspace disease, right greater than left Electronically Signed    By: Franchot Gallo M.D.   On: 11/03/2017 06:30   Dg Chest Port 1 View  Result Date: 11/02/2017 CLINICAL DATA:  Follow-up right pleural effusion. EXAM: PORTABLE CHEST 1 VIEW COMPARISON:  Portable chest x-ray of October 31, 2017 FINDINGS: The lungs are reasonably well inflated. There are small bilateral pleural effusions which appears stable. There is no pneumothorax. The right perihilar and infrahilar regions remain dense. The cardiac silhouette is enlarged. The pulmonary vascularity is not clearly engorged. There is calcification in the wall of the thoracic aorta. There is multilevel degenerative disc disease of the thoracic spine. There is new moderate to severe degenerative change of both shoulders. IMPRESSION: No recurrent pneumothorax. Small bilateral pleural effusions, stable. Persistent right lower lobe atelectasis or pneumonia Stable cardiomegaly without significant pulmonary vascular congestion. Thoracic aortic atherosclerosis. Electronically Signed   By: David  Martinique M.D.  On: 11/02/2017 07:12    Scheduled Meds: . docusate sodium  100 mg Oral BID  . donepezil  10 mg Oral QHS  . [START ON 11/05/2017] enoxaparin (LOVENOX) injection  40 mg Subcutaneous Q24H  . fluticasone  1 spray Each Nare Daily  . furosemide  40 mg Oral Daily  . imatinib  400 mg Oral Daily  . ipratropium-albuterol  3 mL Nebulization BID  . mirabegron ER  50 mg Oral QHS  . potassium chloride  40 mEq Oral Daily  . vitamin B-12  1,000 mcg Oral BID    LOS: 21 days   Time spent: 25 minutes.  Vance Gather, MD [facility] Pager [phone]  If 7PM-7AM, please contact night-coverage [email or web address] Password [email or web address] 11/03/2017, 1:53 PM

## 2017-11-04 ENCOUNTER — Encounter (HOSPITAL_COMMUNITY): Payer: Self-pay | Admitting: General Surgery

## 2017-11-04 LAB — BASIC METABOLIC PANEL
ANION GAP: 6 (ref 5–15)
BUN: 25 mg/dL — AB (ref 6–20)
CHLORIDE: 104 mmol/L (ref 101–111)
CO2: 34 mmol/L — ABNORMAL HIGH (ref 22–32)
Calcium: 8.4 mg/dL — ABNORMAL LOW (ref 8.9–10.3)
Creatinine, Ser: 0.79 mg/dL (ref 0.44–1.00)
GFR calc Af Amer: >60 mL/min (ref >60)
GFR calc non Af Amer: >60 mL/min (ref >60)
GLUCOSE: 89 mg/dL (ref 65–99)
POTASSIUM: 3.8 mmol/L (ref 3.5–5.1)
Sodium: 144 mmol/L (ref 135–145)

## 2017-11-04 LAB — PROTIME-INR
INR: 1.15
Prothrombin Time: 14.6 seconds (ref 11.4–15.2)

## 2017-11-04 MED ORDER — GERHARDT'S BUTT CREAM
TOPICAL_CREAM | Freq: Three times a day (TID) | CUTANEOUS | Status: DC
  Administered 2017-11-04 – 2017-11-07 (×9): via TOPICAL
  Filled 2017-11-04: qty 1

## 2017-11-04 MED ORDER — FLUCONAZOLE 150 MG PO TABS
150.0000 mg | ORAL_TABLET | ORAL | Status: AC
  Administered 2017-11-04 – 2017-11-07 (×2): 150 mg via ORAL
  Filled 2017-11-04 (×2): qty 1

## 2017-11-04 MED ORDER — IMATINIB MESYLATE 100 MG PO TABS
400.0000 mg | ORAL_TABLET | Freq: Every day | ORAL | Status: DC
  Administered 2017-11-04 – 2017-11-06 (×3): 400 mg via ORAL
  Filled 2017-11-04 (×4): qty 4

## 2017-11-04 MED ORDER — ENOXAPARIN SODIUM 40 MG/0.4ML ~~LOC~~ SOLN
40.0000 mg | SUBCUTANEOUS | Status: DC
  Administered 2017-11-05 – 2017-11-06 (×2): 40 mg via SUBCUTANEOUS
  Filled 2017-11-04 (×2): qty 0.4

## 2017-11-04 NOTE — Progress Notes (Signed)
Palliative care progress note Reason for consult: Goals of care in light of dementia, GIST, and current admission for pneumonia, recurrent pleural effusion.   Chart reviewed and discussed with bedside RN.  I met today briefly with Ms. Bowdish (she is confused and does not understand complexities of her medical condition at this time).  I met outside the room with her son and her daughter in law, Laura. We reviewed the patient's current condition, her prognosis, her trajectory, her disposition choices.   BP (!) 116/58 (BP Location: Right Arm)   Pulse (!) 48   Temp 98.1 F (36.7 C) (Axillary)   Resp 20   Ht 5' 4" (1.626 m)   Wt 80.1 kg (176 lb 8 oz)   SpO2 98%   BMI 30.30 kg/m  Labs and imaging noted  Awake alert but suspicious and appears anxious at times S1 S2 Shallow clear breath sounds anteriorly, diminished both bases Abdomen soft Trace edema Dementia at baseline.  PPS 30-40%   Family continues to struggle with how best to support the patient and her wishes. She has caregivers at home but family remains concerned about the level of care she will need moving forward.   I had another family meeting with the patient's son and daughter in law Laura.  We reviewed clinical course over the last week as well as recurrent effusions and recommendation for pleurex catheter.  Family is agreeable to pleurex as long as it is something that can be managed as OP.  Discussed again about hospice and their ability to manage pleurex if she is at home.     We discussed about home with hospice versus SNF rehab with palliative care on discharge. Laura reports that they have continued to reach out to [facility] and would consider this if a bed is available.  Otherwise, likely plan will be home with hospice.  Advised that I am not sure if [facility] will manage Pleurex if they choose SNF.  This would need to be clarified with them.  Family has been very appreciative of Dr. Grunz's guidance and are  hopeful to discuss with him today as well.     Total time: 40 minutes Greater than 50%  of this time was spent counseling and coordinating care related to the above assessment and plan.   , MD [place] [facility] Team [phone]     

## 2017-11-04 NOTE — Progress Notes (Signed)
PROGRESS NOTE  Melanie Trevino  URK:[id number] DOB: 1927/11/23 DOA: 10/13/2017 PCP: Wenda Low, MD   Brief Narrative: Melanie Trevino is an 84 y.o. 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 2u PRBCs were transfused and thoracentesis 12/12 yielded 1.8L dark fluid found to be exudative with negative cytology and culture. She was started on levaquin and diuresed but effusion recurred. Modified barium swallow demonstrated no evidence of aspiration but CT chest demonstrated bilateralpneumonia, with fluid in the airways suggesting aspiration. Her antibiotics were changed to cover aspiration pneumonia on 12/16. Pulmonology and palliative care were consulted. Full course of antibiotics has been completed though pleural effusion continues to recur, requiring thoracentesis x3 and repeat imaging showing progressive effusion on 1/1. IR is consulted for pleur-x catheter placement.    Assessment & Plan: Principal Problem:   Pleural effusion Active Problems:   Breast cancer (HCC)   Dementia of the Alzheimer's type   Gastrointestinal stromal tumor (GIST) (HCC)   Wheezing   Pressure injury of skin  Acute hypoxic respiratory failure due to large recurrent idiopathic right-sided pleural effusion: Exudative with negative cytology and culture on initial thoracentesis 12/12. Repeat thora 12/24 w/2L out and symptomatically improved, follow up CXR showed reaccumulation. There has been suspicion for aspiration pneumonia/pneumonitis, though SLP evaluation appeared to show no aspiration.  - Repeat thoracentesis 1.5L 12/29 bloody fluid (#3 in 3 weeks). Repeat CXR demonstrating progression again, IR consulted for pleur-x, hopefully to be performed today. -Completed abx: ceftriaxone + flagyl stopped 12/20, meropenem 12/20 - 12/28. -  Palliative care and pulmonology assistance appreciated.   Acute on chronic diastolic heart failure: Repeat echo 12/12 showed improved EF to 60-65% with elevated right and left heart pressures, G1DD, no regional WMA's.  - Continue lasix 40mg  po daily. Pt family planning on getting purewick external catheter for nighttime use at home.  - Continue heart healthy diet - Continue strict I/O, daily weights. Overall weight far down from admission, euvolemic.   Pneumobilia: incidentally noted on CT. Asymptomatic without evidence of abdominal infection/necrosis.  - Monitor clinically  Anemia due to chronic disease: FOBT neg. s/p 2u PRBCs 12/11 has remained stable. Ferritin wnl at 160.  - Monitor CBC intermittently to avoid iatrogenic anemia.   Demand ischemia due to anemia and pleural effusion: Asymptomatic. Troponin downtrended 0.67 >> 0.15. EKG is negative for any acute ST-T changes. No regional wall motion abnormalities on echo. - Monitor clinically  Paroxysmal atrial fibrillation: Currently NSR/ bradycardic without rate-control agent.  - No anticoagulation due to fall risk, thrombocytopenia.   Hypokalemia: Due to lasix.  - Resolved, will continue to recheck.  Essential hypertension:  - Currently hypotensive, so stoppedlosartan.  Alzheimer's dementia: Chronic, stable - Continue aricept - Continuing to be in contact with family for medical decision-making.  GIST with liver metastases: follows at [facility]. - Imatinib has been held since admission, known to cause pleural effusions. Unable to reach pt's oncologist, Dr. Kendall Flack on 12/31.   History of breast cancer status post mastectomy: in remission  Stage 1 pressure ulcer on sacrum:  - Offloading as able, foam dressing.   MASD:  - Consulted WOC > TID butt cream (includes antifungal, Zn, steroid). Continue purewick catheter.   Sinus bradycardia: Stable.  - Monitor. Not on offending medications. Would not want pacemaker. DNR  confirmed.   Vulvovaginal  candidiasis: Empiric Dc - Diflucan q72h x2.  DVT prophylaxis: Lovenox Code Status: DNR Family Communication: Discussed with DIL and Son. Disposition Plan: Anticipate home with hospice with pleurx catheter in the next 1-2 days. DIL pursuing placement options, though these seem to be difficult to come by.  Consultants:   Palliative care  Pulmonology  Procedures:   Thoracentesis 12/12 and 12/24.   Echocardiogram 10/14/2017: - Left ventricle: The cavity size was normal. Wall thickness was   increased in a pattern of moderate LVH. Systolic function was   normal. The estimated ejection fraction was in the range of 60%   to 65%. Wall motion was normal; there were no regional wall   motion abnormalities. Doppler parameters are consistent with   abnormal left ventricular relaxation (grade 1 diastolic   dysfunction). The E/e&' ratio is >15, suggesting elevated LV   filling pressure. - Left atrium: The atrium was normal in size. - Right atrium: Moderately dilated. - Pulmonary arteries: PA peak pressure: 36 mm Hg (S). - Systemic veins: The IVC measures <2.1 cm, but does not collapse   >50%, suggesting an elevated RA pressure of 8 mmHg. - Pericardium, extracardiac: Small pericardial effusion. No   tamponade physiology. There was a right pleural effusion.  Impressions: - Compared to a prior study in 2017, sinus rhythm is present. The   LVEF is higher at 60-65%, however, there is grade 1 DD with   elevated left and right heart filling pressures, a small   pericardial effusion without tamponade physiology is noted along   with a right pleural effusion.  Antimicrobials:  Ceftriaxone, flagyl  Meropenem 12/20 - 12/28   Subjective: Confused, perseverative about going home. Has vaginal irritation/itching.  Objective: BP (!) 116/58 (BP Location: Right Arm)   Pulse (!) 48   Temp 98.1 F (36.7 C) (Axillary)   Resp 20   Ht 5\' 4"  (1.626 m)   Wt 80.1 kg  (176 lb 8 oz)   SpO2 94%   BMI 30.30 kg/m   Gen: Elderly, pleasantly confused female in no distress Pulm: Non-labored breathing 2L. No wheezes or crackles, decreased at bases, worse on right and worse from yesterday's exam. CV: Regular rate and rhythm. Trace pedal edema. Skin: Lower legs with hyperpigmentation bilaterally, thin skin throughout. Erythematous pelvic region w/satellite lesions.  Neuro: Alert, not oriented. Right bell's palsy (upper and lower trigeminal distribution) noted which was previously erroneously not documented but has been present throughout my exams. Psych: Judgement and insight appear impaired due to cognitive impairment. Mood & affect appropriate.   CBC: Recent Labs  Lab 10/31/17 0559  WBC 3.2*  HGB 9.6*  HCT 31.3*  MCV 97.8  PLT 093   Basic Metabolic Panel: Recent Labs  Lab 10/31/17 0559 11/01/17 0608 11/02/17 0552 11/03/17 0600 11/04/17 0541  NA 142 143 142 142 144  K 3.0* 3.5 3.5 3.9 3.8  CL 100* 102 102 103 104  CO2 36* 35* 35* 35* 34*  GLUCOSE 90 92 96 96 89  BUN 25* 27* 25* 24* 25*  CREATININE 0.85 0.70 0.64 0.85 0.79  CALCIUM 8.0* 8.1* 8.2* 8.2* 8.4*  MG  --  2.1  --   --   --    GFR: Estimated Creatinine Clearance: 50.8 mL/min (by C-G formula based on SCr of 0.79 mg/dL).  Urine analysis:    Component Value Date/Time   COLORURINE YELLOW 10/20/2017 1730   APPEARANCEUR HAZY (A) 10/20/2017 1730   LABSPEC 1.019 10/20/2017 1730   PHURINE 5.0 10/20/2017  [place] 10/20/2017 1730   HGBUR NEGATIVE 10/20/2017 1730   BILIRUBINUR NEGATIVE 10/20/2017 1730   BILIRUBINUR negative 04/05/2016 1414   KETONESUR NEGATIVE 10/20/2017 1730   PROTEINUR NEGATIVE 10/20/2017 1730   UROBILINOGEN 0.2 04/05/2016 1414   UROBILINOGEN 1.0 07/07/2015 1901   NITRITE NEGATIVE 10/20/2017 1730   LEUKOCYTESUR TRACE (A) 10/20/2017 1730   Recent Results (from the past 240 hour(s))  Body fluid culture     Status: None   Collection Time: 10/26/17  11:06 AM  Result Value Ref Range Status   Specimen Description PLEURAL RIGHT  Final   Special Requests NONE  Final   Gram Stain   Final    FEW WBC PRESENT,BOTH PMN AND MONONUCLEAR NO ORGANISMS SEEN    Culture   Final    NO GROWTH 3 DAYS Performed at [facility], [place] [address], [place] [address]    Report Status 10/29/2017 FINAL  Final  Acid Fast Smear (AFB)     Status: None   Collection Time: 10/26/17 11:06 AM  Result Value Ref Range Status   AFB Specimen Processing Concentration  Final   Acid Fast Smear Negative  Final    Comment: (NOTE) Performed At: [facility] [place], [place] [id number] Rush Farmer MD VQ:[id number]    Source (AFB) PLEURAL  Final    Comment: RIGHT  Acid Fast Smear (AFB)     Status: None   Collection Time: 10/26/17 11:06 AM  Result Value Ref Range Status   AFB Specimen Processing Concentration  Final   Acid Fast Smear Negative  Final    Comment: (NOTE) Performed At: [facility] [place], [place] [id number] Rush Farmer MD YK:[id number]    Source (AFB) PLEURAL  Final  Acid Fast Culture with reflexed sensitivities     Status: None   Collection Time: 10/26/17 11:06 AM  Result Value Ref Range Status   Acid Fast Culture REFERT  Final    Comment: (NOTE) Please refer to the following specimen for additional lab results. For AFB culture results refer to specimen number: [id number] Performed At: [facility] [place], [place] [id number] Rush Farmer MD HG:[id number]    Source of Sample PLEURAL  Final    Comment: RIGHT      Radiology Studies: Dg Chest Port 1 View  Result Date: 11/03/2017 CLINICAL DATA:  Pleural effusion EXAM: PORTABLE CHEST 1 VIEW COMPARISON:  11/02/2017 FINDINGS: Progression of bilateral pleural effusions right greater than left. Progression of bibasilar airspace disease which may be atelectasis or pneumonia. No definite edema.  Atherosclerotic aorta. Cardiac enlargement. Advanced degenerative change in both shoulders IMPRESSION: Progressive bilateral pleural effusion and bibasilar airspace disease, right greater than left Electronically Signed   By: Franchot Gallo M.D.   On: 11/03/2017 06:30    Scheduled Meds: . docusate sodium  100 mg Oral BID  . donepezil  10 mg Oral QHS  . [START ON 11/05/2017] enoxaparin (LOVENOX) injection  40 mg Subcutaneous Q24H  . fluconazole  150 mg Oral Q72H  . fluticasone  1 spray Each Nare Daily  . furosemide  40 mg Oral Daily  . Gerhardt's butt cream   Topical TID  . imatinib  400 mg Oral Daily  . ipratropium-albuterol  3 mL Nebulization BID  . mirabegron ER  50 mg Oral QHS  . potassium chloride  40 mEq Oral Daily  . vitamin B-12  1,000 mcg Oral BID    LOS: 22  days   Time spent: 25 minutes.  Vance Gather, MD [facility] Pager [phone]  If 7PM-7AM, please contact night-coverage [email or web address] Password [facility] 11/04/2017, 3:03 PM

## 2017-11-04 NOTE — Consult Note (Signed)
Chief Complaint: recurrent right pleural effusion  Referring Physician:Dr. Vance Gather  Supervising Physician: Daryll Brod  Patient Status: [facility] - In-pt  HPI: Melanie Trevino is a 84 y.o. 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  She has been admitted with acute hypoxic respiratory failure secondary to right pleural effusion.  She has had 3 thoracentesis in last 3 weeks with 1.8L, 2L, 1.5L removed each time.  She has a recurrence and given she has a GIST tumor with liver mets, it is certainly possible her effusion is sympathetic from this process.  She is likely going to be discharged home with hospice.  A request has been made for pleurX catheter placement.  Past Medical History:  Past Medical History:  Diagnosis Date  . Breast cancer ([place]) 08/18/2012  . Cancer ([place])   . Chronic indwelling Foley catheter 07/08/2015  . Dementia of the Alzheimer's type 07/08/2015  . Gastrointestinal stromal tumor (GIST) ([place]) 07/08/2015  . High cholesterol   . Hypertension   . Liver metastasis ([place]) 07/08/2015  . Renal mass 07/08/2015  . Wheelchair bound 07/08/2015    Past Surgical History:  Past Surgical History:  Procedure Laterality Date  . MASTECTOMY Right     Family History:  Family History  Problem Relation Age of Onset  . Hypertension Father     Social History:  reports that  has never smoked. she has never used smokeless tobacco. She reports that she does not drink alcohol or use drugs.  Allergies:  Allergies  Allergen Reactions  . Shellfish Allergy Anaphylaxis  . Other Other (See Comments)    HORSE SERUMYDrug[Other] swelling6/09/2006 12:00:00 AM by Erin Hearing CPhT  . Amoxicillin Swelling and Rash    Has patient had a PCN reaction causing immediate rash, facial/tongue/throat swelling, SOB or lightheadedness with hypotension: UNKNOWN Has patient had a PCN reaction causing severe rash involving mucus membranes or skin necrosis: Unknown Has patient had a PCN  reaction that required hospitalization: Unknown Has patient had a PCN reaction occurring within the last 10 years: Unknown If all of the above answers are "NO", then may proceed with Cephalosporin use.     Medications: Medications reviewed in epic  Patient unable to do a good ROS as she has dementia and unable to follow the conversation well.  Mallampati Score: MD Evaluation Airway: WNL Heart: WNL Abdomen: WNL Chest/ Lungs: WNL ASA  Classification: 3 Mallampati/Airway Score: Two  Physical Exam: BP (!) 116/58 (BP Location: Right Arm)   Pulse (!) 48   Temp 98.1 F (36.7 C) (Axillary)   Resp 20   Ht 5' 4"  (1.626 m)   Wt 176 lb 8 oz (80.1 kg)   SpO2 94%   BMI 30.30 kg/m  Body mass index is 30.3 kg/m. General: pleasant, WD, WN, elderly demented white female who is laying in bed in NAD HEENT: head is normocephalic, atraumatic.  Sclera are noninjected.  PERRL.  Ears and nose without any masses or lesions.  Mouth is pink and moist Heart: regular, rate, and rhythm.  Normal s1,s2. No obvious murmurs, gallops, or rubs noted.  Palpable radial pulses bilaterally Lungs: CTAB, no wheezes, rhonchi, or rales noted.  Respiratory effort nonlabored on 2L.  Decrease BS at bases R>L Abd: soft, NT, ND, +BS, no masses, hernias, or organomegaly Psych: A&Ox3 with an appropriate affect.   Labs: Results for orders placed or performed during the hospital encounter of 10/13/17 (from the past 48 hour(s))  Basic metabolic panel  Status: Abnormal   Collection Time: 11/03/17  6:00 AM  Result Value Ref Range   Sodium 142 135 - 145 mmol/L   Potassium 3.9 3.5 - 5.1 mmol/L   Chloride 103 101 - 111 mmol/L   CO2 35 (H) 22 - 32 mmol/L   Glucose, Bld 96 65 - 99 mg/dL   BUN 24 (H) 6 - 20 mg/dL   Creatinine, Ser 0.85 0.44 - 1.00 mg/dL   Calcium 8.2 (L) 8.9 - 10.3 mg/dL   GFR calc non Af Amer 60 (L) >60 mL/min   GFR calc Af Amer >60 >60 mL/min    Comment: (NOTE) The eGFR has been calculated using the  CKD EPI equation. This calculation has not been validated in all clinical situations. eGFR's persistently <60 mL/min signify possible Chronic Kidney Disease.    Anion gap 4 (L) 5 - 15  Basic metabolic panel     Status: Abnormal   Collection Time: 11/04/17  5:41 AM  Result Value Ref Range   Sodium 144 135 - 145 mmol/L   Potassium 3.8 3.5 - 5.1 mmol/L   Chloride 104 101 - 111 mmol/L   CO2 34 (H) 22 - 32 mmol/L   Glucose, Bld 89 65 - 99 mg/dL   BUN 25 (H) 6 - 20 mg/dL   Creatinine, Ser 0.79 0.44 - 1.00 mg/dL   Calcium 8.4 (L) 8.9 - 10.3 mg/dL   GFR calc non Af Amer >60 >60 mL/min   GFR calc Af Amer >60 >60 mL/min    Comment: (NOTE) The eGFR has been calculated using the CKD EPI equation. This calculation has not been validated in all clinical situations. eGFR's persistently <60 mL/min signify possible Chronic Kidney Disease.    Anion gap 6 5 - 15  Protime-INR     Status: None   Collection Time: 11/04/17  8:43 AM  Result Value Ref Range   Prothrombin Time 14.6 11.4 - 15.2 seconds   INR 1.15     Imaging: Dg Chest Port 1 View  Result Date: 11/03/2017 CLINICAL DATA:  Pleural effusion EXAM: PORTABLE CHEST 1 VIEW COMPARISON:  11/02/2017 FINDINGS: Progression of bilateral pleural effusions right greater than left. Progression of bibasilar airspace disease which may be atelectasis or pneumonia. No definite edema. Atherosclerotic aorta. Cardiac enlargement. Advanced degenerative change in both shoulders IMPRESSION: Progressive bilateral pleural effusion and bibasilar airspace disease, right greater than left Electronically Signed   By: Franchot Gallo M.D.   On: 11/03/2017 06:30    Assessment/Plan 1. Recurrent right pleural effusion  Given recurrent effusion and possibility, despite negative paths, that this effusion is secondary to her metastatic process, along with plan for DC with hospice, we will plan to place a PleurX catheter.  She will be NPO p MN tonight to attempt placement  tomorrow; however, if we are unable to due to scheduling, we will plan for Friday.  This was discussed with her DIL as the patient is unable to understand her situation.   Risks and benefits discussed with the patient including bleeding, infection, damage to adjacent structures.. All of the patient's questions were answered, patient is agreeable to proceed. Consent signed and in chart.  Thank you for this interesting consult.  I greatly enjoyed meeting Melanie Trevino and look forward to participating in their care.  A copy of this report was sent to the requesting provider on this date.  Electronically Signed: Henreitta Cea 11/04/2017, 3:26 PM   I spent a total of 40 Minutes  in face to face in clinical consultation, greater than 50% of which was counseling/coordinating care for recurrent right pleural effusion

## 2017-11-04 NOTE — Consult Note (Signed)
[facility] Nurse wound consult note Reason for Consult:Moisture Associated Skin Damage (MASD) plus infection  (suspected yeast/fungal overgrowth).  Skin inspection with the assistance of (paid) caregiver (aide) from home. Wound type:No wounds.  MASD plus fungal Pressure Injury POA: NA Measurement: N/A Wound bed:N/A Drainage (amount, consistency, odor) Vaginal drainage is white and thick consistent with fungal overgrowth Periwound:No wound Dressing procedure/placement/frequency: Patient is with urinary incontinence and has external powered female urinary incontinence device (PurWick) in place.  Buttocks and perineal area with erythema (blanching) and with satellite lesions consistent with fungal overgrowth. Sacrum and heels are intact. Prophylactic sacral dressing (foam) is in place. Heels are floated.  I have provided Nursing with guidance for turning and repositioning to maximize airflow to the buttocks and perineum. She has the urinary device in place as noted above for UI. I will add three times daily application of Gerhart's Butt Cream, a 1:1:1 compounded preparation of zinc oxide, hydrocortisone and lotrimin as a topical therapy.  Recommend systemic dose or two of antifungal (eg. Diflucan) for more prompt resolution of fungal overgrowth/infection.  If you agree, please order.  [facility] nursing team will not follow, but will remain available to this patient, the nursing and medical teams.  Please re-consult if needed. Thanks, Maudie Flakes, MSN, RN, [place], Ar[facility] Abbott  Pager# [phone]

## 2017-11-04 NOTE — Progress Notes (Signed)
Palliative care progress note Reason for consult: Goals of care in light of dementia, GIST, and current admission for pneumonia, recurrent pleural effusion.   Chart reviewed and discussed with bedside RN.  I met today briefly with Ms. Correira (she is confused and does not understand complexities of her medical condition at this time).  She denies any concerns or complaints today. No family at bedside.  Caregiver at bedside this evening.  BP (!) 116/58 (BP Location: Right Arm)   Pulse (!) 48   Temp 98.1 F (36.7 C) (Axillary)   Resp 20   Ht 5' 4"  (1.626 m)   Wt 80.1 kg (176 lb 8 oz)   SpO2 94%   BMI 30.30 kg/m  Labs and imaging noted  Awake alert S1 S2 Shallow clear breath sounds anteriorly, diminished both bases Abdomen soft Trace edema Dementia at baseline.  PPS 30-40%  Family continues to struggle with how best to support the patient and her wishes. She has caregivers at home but family remains concerned about the level of care she will need moving forward.   Plan for pleurex placement.  We have been discussing options for home with hospice versus SNF rehab with palliative care on discharge.   Family has continued to reach out to [facility] and would consider this if a bed is available.  Otherwise, likely plan will be home with hospice.  Advised that I am not sure if [facility] will manage Pleurex if they choose SNF.  This would need to be clarified with them.  Appreciate CSW assistance.  D/w Dr. Bonner Puna     Total time: 20 minutes Greater than 50%  of this time was spent counseling and coordinating care related to the above assessment and plan.  Micheline Rough, MD [place] Team [phone]

## 2017-11-04 NOTE — Progress Notes (Addendum)
LCSW consulted for SNF placement.  Family is only interested in [facility]. LCSW previously explained to family that [facility] usually operates at [facility] [employer] because their residents have priority. LCSW previously presented family with bed offers. Family refused offers available. Please see previous assessment and notes for more information.   LCSW contacted both locations regarding bed availability.   [facility]- Unable to make a bed offer due to patients chest tube. [facility] will follow up with Mickel Baas, patients daughter in law, directly.  [facility]- No beds   Servando Snare, [place]

## 2017-11-05 ENCOUNTER — Inpatient Hospital Stay (HOSPITAL_COMMUNITY): Payer: Medicare Other

## 2017-11-05 DIAGNOSIS — C50919 Malignant neoplasm of unspecified site of unspecified female breast: Secondary | ICD-10-CM

## 2017-11-05 DIAGNOSIS — Z515 Encounter for palliative care: Secondary | ICD-10-CM

## 2017-11-05 DIAGNOSIS — Z7189 Other specified counseling: Secondary | ICD-10-CM

## 2017-11-05 HISTORY — PX: IR PERC TUN PERIT CATH WO PORT S&I /IMAG: IMG2327

## 2017-11-05 LAB — CBC WITH DIFFERENTIAL/PLATELET
BASOS ABS: 0 10*3/uL (ref 0.0–0.1)
BASOS PCT: 0 %
EOS PCT: 5 %
Eosinophils Absolute: 0.2 10*3/uL (ref 0.0–0.7)
HCT: 30.2 % — ABNORMAL LOW (ref 36.0–46.0)
Hemoglobin: 9.5 g/dL — ABNORMAL LOW (ref 12.0–15.0)
Lymphocytes Relative: 10 %
Lymphs Abs: 0.4 10*3/uL — ABNORMAL LOW (ref 0.7–4.0)
MCH: 30 pg (ref 26.0–34.0)
MCHC: 31.5 g/dL (ref 30.0–36.0)
MCV: 95.3 fL (ref 78.0–100.0)
MONO ABS: 0.5 10*3/uL (ref 0.1–1.0)
Monocytes Relative: 13 %
NEUTROS ABS: 2.7 10*3/uL (ref 1.7–7.7)
Neutrophils Relative %: 72 %
PLATELETS: 233 10*3/uL (ref 150–400)
RBC: 3.17 MIL/uL — AB (ref 3.87–5.11)
RDW: 15.4 % (ref 11.5–15.5)
WBC: 3.7 10*3/uL — AB (ref 4.0–10.5)

## 2017-11-05 LAB — BASIC METABOLIC PANEL
ANION GAP: 5 (ref 5–15)
BUN: 24 mg/dL — AB (ref 6–20)
CALCIUM: 8.5 mg/dL — AB (ref 8.9–10.3)
CO2: 33 mmol/L — ABNORMAL HIGH (ref 22–32)
Chloride: 104 mmol/L (ref 101–111)
Creatinine, Ser: 0.78 mg/dL (ref 0.44–1.00)
GFR calc Af Amer: >60 mL/min (ref >60)
GLUCOSE: 94 mg/dL (ref 65–99)
Potassium: 3.8 mmol/L (ref 3.5–5.1)
Sodium: 142 mmol/L (ref 135–145)

## 2017-11-05 MED ORDER — LIDOCAINE HCL 1 % IJ SOLN
INTRAMUSCULAR | Status: AC | PRN
  Administered 2017-11-05: 10 mL

## 2017-11-05 MED ORDER — CLINDAMYCIN PHOSPHATE 900 MG/50ML IV SOLN
INTRAVENOUS | Status: AC
  Administered 2017-11-05: 900 mg via INTRAVENOUS
  Filled 2017-11-05: qty 50

## 2017-11-05 MED ORDER — MIDAZOLAM HCL 2 MG/2ML IJ SOLN
INTRAMUSCULAR | Status: AC | PRN
  Administered 2017-11-05: 0.5 mg via INTRAVENOUS

## 2017-11-05 MED ORDER — FENTANYL CITRATE (PF) 100 MCG/2ML IJ SOLN
INTRAMUSCULAR | Status: AC
  Filled 2017-11-05: qty 2

## 2017-11-05 MED ORDER — FENTANYL CITRATE (PF) 100 MCG/2ML IJ SOLN
INTRAMUSCULAR | Status: AC | PRN
  Administered 2017-11-05: 25 ug via INTRAVENOUS

## 2017-11-05 MED ORDER — MIDAZOLAM HCL 2 MG/2ML IJ SOLN
INTRAMUSCULAR | Status: AC
  Filled 2017-11-05: qty 2

## 2017-11-05 MED ORDER — CLINDAMYCIN PHOSPHATE 900 MG/50ML IV SOLN
900.0000 mg | Freq: Once | INTRAVENOUS | Status: AC
  Administered 2017-11-05: 900 mg via INTRAVENOUS

## 2017-11-05 NOTE — Progress Notes (Signed)
   11/05/17 1500  Clinical Encounter Type  Visited With Patient;Health care provider  Visit Type Follow-up  Spiritual Encounters  Spiritual Needs Emotional;Prayer   Rounding on Palliative Patients today.  Patient was welcoming, but getting ready to go for a procedure.  Short visit.  We prayed together.  Will follow as needed. Chaplain Katherene Ponto

## 2017-11-05 NOTE — Progress Notes (Signed)
PROGRESS NOTE    Melanie Trevino  VFI:[id number] DOB: 1927/12/17 DOA: 10/13/2017 PCP: Wenda Low, MD   Brief Narrative: Melanie Trevino is an 84 y.o. 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 2u PRBCs were transfused and thoracentesis 12/12 yielded 1.8L dark fluid found to be exudative with negative cytology and culture. She was started on levaquin and diuresed but effusion recurred. Modified barium swallow demonstrated no evidence of aspiration but CT chest demonstrated bilateralpneumonia, with fluid in the airways suggesting aspiration. Her antibiotics were changed to cover aspiration pneumonia on 12/16. Pulmonology and palliative care were consulted. Full course of antibiotics has been completed though pleural effusion continues to recur, requiring thoracentesis x3 and repeat imaging showing progressive effusion on 1/1. IR is consulted for pleur-x catheter placement.    Patient underwent right-sided Pleurx catheter today by IR.   Assessment & Plan:   Principal Problem:   Pleural effusion Active Problems:   Breast cancer ([facility])   Dementia of the Alzheimer's type   Gastrointestinal stromal tumor (GIST) ([facility])   Wheezing   Pressure injury of skin   Acute respiratory failure with hypoxia secondary to recurrent idiopathic right-sided pleural effusion. So far negative cytology and negative cultures on initial thoracentesis.  After which patient had to repeat thoracentesis 1 on 12/24 and  on 12/29.  IR consulted for a Pleurx catheter and was placed today. Palliative care consulted and recommendations given plan [facility].   Mild acute on chronic diastolic heart failure Resolved Currently on Lasix 40 daily we will continue with the same.   Pneumobilia  incidentally noted on CT currently asymptomatic.   Anemia of chronic disease FOBT is negative Anemia panel reviewed Status post 2 units of PRBC transfusion Hemoglobin currently stable   Hypokalemia Replaced   Hypertension Well-controlled   Dementia chronic stable Resume Aricept   History of GIST with liver metastases Gleevec resumed.    Paroxysmal atrial fibrillation currently in normal sinus rhythm  DVT prophylaxis: Lovenox Code Status: (DNR Family Communication: Discussed with daughter-in-law today Disposition Plan: ([facility]   Consultants:   Palliative care consult  IR\  Pulmonology  Procedures: Pleurx catheter placement by IR today Thoracentesis Echocardiogram Antimicrobials: None  Subjective: No new complaints.  Objective: Vitals:   11/05/17 1625 11/05/17 1635 11/05/17 1640 11/05/17 1736  BP: 139/71 134/71 (!) 154/80 (!) 126/47  Pulse: (!) 49 (!) 48 (!) 48 (!) 47  Resp: 20 20 16 18   Temp:    97.7 F (36.5 C)  TempSrc:    Oral  SpO2: 97% 96% 94% 100%  Weight:      Height:        Intake/Output Summary (Last 24 hours) at 11/05/2017 1809 Last data filed at 11/05/2017 4166 Gross per 24 hour  Intake -  Output 1200 ml  Net -1200 ml   Filed Weights   11/03/17 0525 11/04/17 0500 11/05/17 0602  Weight: 83.3 kg (183 lb 9.6 oz) 80.1 kg (176 lb 8 oz) 77.7 kg (171 lb 4.8 oz)    Examination:  General exam: Appears calm and comfortable  Respiratory system: Clear to auscultation. Respiratory effort normal. Cardiovascular system: S1 & S2 heard, RRR. No JVD, murmurs, rubs, gallops or clicks. No pedal edema. Gastrointestinal system:  Abdomen is nondistended, soft and nontender. No organomegaly or masses felt. Normal bowel sounds heard. Central nervous system: Alert and confused.  Extremities: Symmetric 5 x 5 power. Skin: No rashes, lesions or ulcers Psychiatry:Mood & affect appropriate.      Data  Reviewed: I have personally reviewed following labs and imaging studies  CBC: Recent Labs  Lab 10/31/17 0559 11/05/17 0949  WBC 3.2* 3.7*  NEUTROABS  --  2.7  HGB 9.6* 9.5*  HCT 31.3* 30.2*  MCV 97.8 95.3  PLT 220 297   Basic Metabolic Panel: Recent Labs  Lab 11/01/17 0608 11/02/17 0552 11/03/17 0600 11/04/17 0541 11/05/17 0539  NA 143 142 142 144 142  K 3.5 3.5 3.9 3.8 3.8  CL 102 102 103 104 104  CO2 35* 35* 35* 34* 33*  GLUCOSE 92 96 96 89 94  BUN 27* 25* 24* 25* 24*  CREATININE 0.70 0.64 0.85 0.79 0.78  CALCIUM 8.1* 8.2* 8.2* 8.4* 8.5*  MG 2.1  --   --   --   --    GFR: Estimated Creatinine Clearance: 50 mL/min (by C-G formula based on SCr of 0.78 mg/dL). Liver Function Tests: No results for input(s): AST, ALT, ALKPHOS, BILITOT, PROT, ALBUMIN in the last 168 hours. No results for input(s): LIPASE, AMYLASE in the last 168 hours. No results for input(s): AMMONIA in the last 168 hours. Coagulation Profile: Recent Labs  Lab 11/04/17 0843  INR 1.15   Cardiac Enzymes: No results for input(s): CKTOTAL, CKMB, CKMBINDEX, TROPONINI in the last 168 hours. BNP (last 3 results) No results for input(s): PROBNP in the last 8760 hours. HbA1C: No results for input(s): HGBA1C in the last 72 hours. CBG: No results for input(s): GLUCAP in the last 168 hours. Lipid Profile: No results for input(s): CHOL, HDL, LDLCALC, TRIG, CHOLHDL, LDLDIRECT in the last 72 hours. Thyroid Function Tests: No results for input(s): TSH, T4TOTAL, FREET4, T3FREE, THYROIDAB in the last 72 hours. Anemia Panel: No results for input(s): VITAMINB12, FOLATE, FERRITIN, TIBC, IRON, RETICCTPCT in the last 72 hours. Sepsis Labs: No results for input(s): PROCALCITON, LATICACIDVEN in the last 168 hours.  No results found for this or any previous visit (from the past 240 hour(s)).       Radiology Studies: No results found.      Scheduled Meds: . fentaNYL      . midazolam      . docusate  sodium  100 mg Oral BID  . donepezil  10 mg Oral QHS  . enoxaparin (LOVENOX) injection  40 mg Subcutaneous Q24H  . fluconazole  150 mg Oral Q72H  . fluticasone  1 spray Each Nare Daily  . furosemide  40 mg Oral Daily  . Gerhardt's butt cream   Topical TID  . imatinib  400 mg Oral Daily  . ipratropium-albuterol  3 mL Nebulization BID  . mirabegron ER  50 mg Oral QHS  . potassium chloride  40 mEq Oral Daily  . vitamin B-12  1,000 mcg Oral BID   Continuous Infusions:   LOS: 23 days    Time spent: 35 minutes.     Hosie Poisson, MD [facility] Pager [phone]  If 7PM-7AM, please contact night-coverage [email or web address] Password [facility] 11/05/2017, 6:09 PM

## 2017-11-05 NOTE — Progress Notes (Signed)
Palliative care progress note Reason for consult: Goals of care in light of dementia, GIST, and current admission for pneumonia, recurrent pleural effusion.   Chart reviewed and discussed with bedside RN.  I met today briefly with Melanie Trevino (she is confused and does not understand complexities of her medical condition at this time).  She denies any concerns or complaints today.  Met outside the room with her daughter-in-law, Melanie Trevino.  We discussed again plan for Pleurx placement.  Family remains hopeful that there may be an option [facility].  I called and spoke with social work who reports that [facility] is unable to offer a bed with Pleurx in place.  Melanie Trevino is hopeful that this can be reconsidered if they elect to transition to a [facility] with the support of hospice as hospice will be able to manage draining the Pleurx catheter.  BP (!) 126/49 (BP Location: Left Arm)   Pulse (!) 50   Temp 98.4 F (36.9 C) (Oral)   Resp 20   Ht 5' 4" (1.626 m)   Wt 77.7 kg (171 lb 4.8 oz)   SpO2 96%   BMI 29.40 kg/m  Labs and imaging noted  Awake alert S1 S2 Shallow clear breath sounds anteriorly, diminished both bases Abdomen soft Trace edema Dementia at baseline.  PPS 30-40%  Family continues to struggle with how best to support the patient and her wishes. She has caregivers at home but family remains concerned about the level of care she will need moving forward.   Plan for pleurex placement.  We discussed today about options for home with hospice versus potential for [facility] with hosp[facility] on discharge.   Family to reach out again to [facility] and would consider this if a bed is available.  Family would be in agreement with her transitioning with the support of hospice if this would enable her to transition with Pleurx catheter.  Otherwise, likely plan will be home with hospice.  Appreciate CSW assistance.  D/w bedside RN     Total time: 40  minutes Greater than 50%  of this time was spent counseling and coordinating care related to the above assessment and plan.   , MD [place] Palliative Medicine Team [phone]     

## 2017-11-05 NOTE — Care Management Important Message (Signed)
Important Message  Patient Details  Name: ARES TEGTMEYER MRN: [id number] Date of Birth: Oct 20, 1928   Medicare Important Message Given:  Yes    Kerin Salen 11/05/2017, 10:54 AMImportant Message  Patient Details  Name: [place] DOST MRN: [id number] Date of Birth: 02-17-1928   Medicare Important Message Given:  Yes    Kerin Salen 11/05/2017, 10:54 AM

## 2017-11-05 NOTE — Progress Notes (Signed)
LCSW consulted for SNF placement.   [facility] to see if they would accept patient if hospice was involved and managed the chest tube.  Katie notified LCSW that they no longer have beds available at this time.   LCSW notified Palliative MD.    Servando Snare, [place] [phone]

## 2017-11-06 ENCOUNTER — Encounter (HOSPITAL_COMMUNITY): Payer: Self-pay | Admitting: Interventional Radiology

## 2017-11-06 LAB — BASIC METABOLIC PANEL
Anion gap: 6 (ref 5–15)
BUN: 25 mg/dL — AB (ref 6–20)
CHLORIDE: 103 mmol/L (ref 101–111)
CO2: 33 mmol/L — AB (ref 22–32)
Calcium: 8.4 mg/dL — ABNORMAL LOW (ref 8.9–10.3)
Creatinine, Ser: 0.99 mg/dL (ref 0.44–1.00)
GFR calc Af Amer: 58 mL/min — ABNORMAL LOW (ref >60)
GFR calc non Af Amer: 50 mL/min — ABNORMAL LOW (ref >60)
Glucose, Bld: 93 mg/dL (ref 65–99)
Potassium: 4.3 mmol/L (ref 3.5–5.1)
SODIUM: 142 mmol/L (ref 135–145)

## 2017-11-06 NOTE — Progress Notes (Signed)
Referring Physician(s): Grunz,R  Supervising Physician: Aletta Edouard  Patient Status:  [facility] - In-pt  Chief Complaint:  Recurrent right pleural effusion  Subjective:  Pt resting quietly; denies CP, worsening dyspnea; working on puzzle book  Allergies: Shellfish allergy; Other; and Amoxicillin  Medications: Prior to Admission medications   Medication Sig Start Date End Date Taking? Authorizing Provider  albuterol (PROVENTIL HFA;VENTOLIN HFA) 108 (90 Base) MCG/ACT inhaler Inhale 2 puffs into the lungs every 4 (four) hours as needed for wheezing or shortness of breath (cough, shortness of breath or wheezing.). 10/10/17  Yes Bjorn Pippin, PA-C  Calcium Carb-Cholecalciferol (CALCIUM 600+D) 600-800 MG-UNIT TABS Take 1 tablet by mouth daily.   Yes [provider]  docusate sodium (COLACE) 100 MG capsule Take 100 mg by mouth 2 (two) times daily. Reported on 04/09/2016   Yes [provider]  donepezil (ARICEPT) 10 MG tablet Take 10 mg by mouth at bedtime.   Yes [provider]  fluticasone (FLONASE) 50 MCG/ACT nasal spray Place 1 spray into both nostrils daily.  07/27/12  Yes [provider]  GLEEVEC 400 MG tablet Take 400 mg by mouth daily. 09/19/17  Yes [provider]  Homeopathic Products (ARNICARE ARTHRITIS PO) Apply 1 application topically 4 (four) times daily as needed (for stiffness/pain.).   Yes [provider]  losartan (COZAAR) 25 MG tablet Take 1 tablet (25 mg total) by mouth daily. 07/31/16  Yes Robbie Lis, MD  methocarbamol (ROBAXIN) 500 MG tablet Take 500 mg by mouth every 8 (eight) hours as needed for muscle spasms.    Yes [provider]  mirabegron ER (MYRBETRIQ) 50 MG TB24 tablet Take 50 mg by mouth at bedtime.    Yes [provider]  vitamin B-12 (CYANOCOBALAMIN) 1000 MCG tablet Take 1,000 mcg by mouth 2 (two) times daily.    Yes [provider]  zinc oxide (BALMEX) 11.3 % CREA cream  Apply 1 application topically 4 (four) times daily as needed (for barrier cream to buttocks area).   Yes [provider]  ciprofloxacin (CIPRO) 500 MG tablet Take 1 tablet (500 mg total) by mouth daily. Patient not taking: Reported on 10/13/2017 08/01/16   Robbie Lis, MD  furosemide (LASIX) 40 MG tablet Take 1 tablet (40 mg total) by mouth daily. 10/16/17   Janece Canterbury, MD  ipratropium-albuterol (DUONEB) 0.5-2.5 (3) MG/3ML SOLN Take 3 mLs by nebulization every 4 (four) hours as needed. 10/16/17   Janece Canterbury, MD  potassium chloride SA (K-DUR,KLOR-CON) 20 MEQ tablet Take 1 tablet (20 mEq total) by mouth 2 (two) times daily. 10/16/17   Janece Canterbury, MD  Spacer/Aero-Holding Josiah Lobo DEVI Use with MDI as directed 01/17/14   Roselee Culver, MD     Vital Signs: BP 131/65 (BP Location: Right Arm)   Pulse (!) 44   Temp 98.5 F (36.9 C) (Oral)   Resp 20   Ht 5\' 4"  (1.626 m)   Wt 169 lb 15.6 oz (77.1 kg)   SpO2 96%   BMI 29.18 kg/m   Physical Exam rt pleurx cath intact, site mildly tender, gauze clean and dry  Imaging: Ir Perc Athena Masse Perit Cath Wo Port  Result Date: 11/06/2017 CLINICAL DATA:  Recurrent right pleural effusion requiring recent large volume thoracentesis procedures. EXAM: INSERTION OF TUNNELED PLEURAL DRAINAGE CATHETER ANESTHESIA/SEDATION: Moderate (conscious) sedation was employed during this procedure. A total of Versed 0.5 mg and Fentanyl 25 mcg was administered intravenously. Moderate Sedation Time: 20 minutes. The  patient's level of consciousness and vital signs were monitored continuously by radiology nursing throughout the procedure under my direct supervision. MEDICATIONS: 2 g IV Ancef. Antibiotic was administered in an appropriate time interval for the procedure. FLUOROSCOPY TIME:  12 seconds.  1.3 mGy. PROCEDURE: The procedure, risks, benefits, and alternatives were explained to the patient's daughter as the patient has underlying dementia. Questions  regarding the procedure were encouraged and answered. The patient's daughter understands and consents to the procedure. The right chest wall was prepped with Betadine in a sterile fashion, and a sterile drape was applied covering the operative field. A sterile gown and sterile gloves were used for the procedure. Local anesthesia was provided with 1% Lidocaine. Ultrasound image documentation was performed. Fluoroscopy during the procedure and fluoroscopic spot radiograph confirms appropriate catheter position. After creating a small skin incision, a 19 gauge needle was advanced into the pleural cavity under ultrasound guidance. A guide wire was then advanced under fluoroscopy into the pleural space. Pleural access was dilated serially and a 16-French peel-away sheath placed. A 15.5 French tunneled PleurX catheter was placed. This was tunneled from an incision 5 cm below the pleural access to the access site. The catheter was advanced through the peel-away sheath. The sheath was then removed. Final catheter positioning was confirmed with a fluoroscopic spot image. The access incision was closed with subcutaneous 3-0 Monocryl and subcuticular 4-0 Vicryl. Dermabond was applied to the incision. A Prolene retention suture was applied at the catheter exit site. Large volume thoracentesis was performed through the new catheter utilizing a vacuum bottle. COMPLICATIONS: None. FINDINGS: The catheter was placed via the right lateral chest wall. Catheter course is towards the apex in the basilar and medial pleural space. Approximately 1.2 liters of grossly bloody pleural fluid was able to be removed after catheter placement. IMPRESSION: Placement of permanent, tunneled right pleural drainage catheter via lateral approach. 1.2 liters of pleural fluid was removed today after catheter placement. Electronically Signed   By: Aletta Edouard M.D.   On: 11/06/2017 08:32   Dg Chest Port 1 View  Result Date: 11/03/2017 CLINICAL DATA:   Pleural effusion EXAM: PORTABLE CHEST 1 VIEW COMPARISON:  11/02/2017 FINDINGS: Progression of bilateral pleural effusions right greater than left. Progression of bibasilar airspace disease which may be atelectasis or pneumonia. No definite edema. Atherosclerotic aorta. Cardiac enlargement. Advanced degenerative change in both shoulders IMPRESSION: Progressive bilateral pleural effusion and bibasilar airspace disease, right greater than left Electronically Signed   By: Franchot Gallo M.D.   On: 11/03/2017 06:30    Labs:  CBC: Recent Labs    10/23/17 0613 10/25/17 0559 10/27/17 1733 10/31/17 0559 11/05/17 0949  WBC 4.1 5.1  --  3.2* 3.7*  HGB 10.0* 9.7* 9.9* 9.6* 9.5*  HCT 31.3* 30.7* 31.0* 31.3* 30.2*  PLT 181 184  --  220 233    COAGS: Recent Labs    10/14/17 1552 11/04/17 0843  INR 1.25 1.15  APTT 26  --     BMP: Recent Labs    11/03/17 0600 11/04/17 0541 11/05/17 0539 11/06/17 0558  NA 142 144 142 142  K 3.9 3.8 3.8 4.3  CL 103 104 104 103  CO2 35* 34* 33* 33*  GLUCOSE 96 89 94 93  BUN 24* 25* 24* 25*  CALCIUM 8.2* 8.4* 8.5* 8.4*  CREATININE 0.85 0.79 0.78 0.99  GFRNONAA 60* >60 >60 50*  GFRAA >60 >60 >60 58*    LIVER FUNCTION TESTS: Recent Labs    10/13/17  1750 10/14/17 1552  BILITOT 0.7 0.9  AST 48* 43*  ALT 16 15  ALKPHOS 66 62  PROT 5.6* 5.3*  ALBUMIN 3.3* 3.0*    Assessment and Plan: Pt with hx GIST with liver mets/breast cancer, Alzh dementia, recurrent rt pleural effusion; s/p rt pleurx 1/3; afebrile; 1.2 liters pleural fluid removed yesterday; drain pleurx as needed based on pt's symptoms   Electronically Signed: D. Rowe Robert, PA-C 11/06/2017, 9:44 AM   I spent a total of 15 minutes at the the patient's bedside AND on the patient's hospital floor or unit, greater than 50% of which was counseling/coordinating care for right pleurx drain    Patient ID: Melanie Trevino, female   DOB: 12/21/1927, 84 y.o.   MRN: [id number]

## 2017-11-06 NOTE — Progress Notes (Deleted)
Palliative care progress note Reason for consult: Goals of care in light of dementia, GIST, and current admission for pneumonia, recurrent pleural effusion.   Chart reviewed and discussed with bedside RN.  I met today briefly with Melanie Trevino (she is confused and does not understand complexities of her medical condition at this time).  She denies any concerns or complaints today.  Called her daughter-in-law, Melanie Trevino.   Family remains hopeful that there may be an option for her to discharge to [place].  Social work reports that [facility] is going to be unlikely to offer a bed with Pleurx in place.  Melanie Trevino is hopeful that this can be reconsidered if they elect to transition to a friend's home with the support of hospice as hospice will be able to manage draining the Pleurx catheter.  She reports waiting for a call back from admissions coordinator at friends home.   BP 131/65 (BP Location: Right Arm)   Pulse (!) 44   Temp 98.5 F (36.9 C) (Oral)   Resp 20   Ht 5' 4"  (1.626 m)   Wt 77.1 kg (169 lb 15.6 oz)   SpO2 96%   BMI 29.18 kg/m  Labs and imaging noted  Awake alert S1 S2 Shallow clear breath sounds anteriorly, diminished both bases Abdomen soft Trace edema Dementia at baseline.  PPS 30-40%  Family continues to struggle with how best to support the patient and her wishes. She has caregivers at home but family remains concerned about the level of care she will need moving forward.   S/p pleurex placement.  We again discussed options for home with hospice versus potential for friend's home with hospice on discharge.  Advised that we will likely be needing to plan for discharge home with hospice support.   Family to reach out again to [facility] and would consider this if a bed is available.  Family would be in agreement with her transitioning with the support of hospice if this would enable her to transition with Pleurx catheter.  Otherwise, likely plan will be home with  hospice.  Appreciate CSW assistance.  D/w bedside RN     Total time: 40 minutes Greater than 50%  of this time was spent counseling and coordinating care related to the above assessment and plan.  Micheline Rough, MD [place] Team [phone]

## 2017-11-06 NOTE — Progress Notes (Addendum)
[facility]) Hospital Liaison:  RN visit  Notified by Dr. Domingo Cocking, of family request for [facility] services at home after discharge.  Chart and patient information reviewed by [facility] physician.  Hospice eligibility approved.   Writer spoke with Mickel Baas at bedside to initiate education related to hospice philosophy, services and team approach to care.  Family verbalized understanding of information given.  Per discussion, plan is for discharge home by [facility] on 11/07/17.  Please send signed and completed DNR form home with patient/family.  Patient will need prescriptions for discharge comfort medications.   DME needs have been discussed, patient currently has the following equipment in the home:  Hospital bed, motorized wheelchair, purewick system, hoyer lift.  Family requests the following equipment be delivered to the home:  O2 concentrator and OBT.  [facility] equipment manager has been notified and will contact [place] to arrange delivery to the home.  Home address is:  [address], [place], [place] [address] - and verified with the family.  Mickel Baas at [phone], is the family member to contact to arrange time of delivery.  Referral Center aware of the above.  Completed discharge summary will need to be faxed to [facility] at [phone], when final.  Please notify [facility] when patient is ready to leave the unit at discharge.  (Call [phone] or [phone] on weekdays after 5 pm and on weekends).  [facility] information and contact numbers given to Mickel Baas during this visit.  Above information shared with Alinda Sierras [facility].  Please call with any hospice related questions.   Thank you for this referral.  Edyth Gunnels, RN, [place] Hospital Liaison [phone]  All hospital liaisons are now on [place].

## 2017-11-06 NOTE — Progress Notes (Signed)
Palliative care progress note Reason for consult: Goals of care in light of dementia, GIST, and current admission for pneumonia, recurrent pleural effusion.   Chart reviewed and discussed with bedside RN.  I met today briefly with Melanie Trevino (she is confused and does not understand complexities of her medical condition at this time).  She denies any concerns or complaints today.  Called her daughter-in-law, Melanie Trevino.  Discussed care needs now that she is s/p Pleurx placement.  Family remains hopeful that there may be an option for her to discharge to friend[facility] home.  I called and spoke with social work who reports that [facility] will likely not take with Pleurx in place.  Melanie Trevino is hopeful that this can be reconsidered if they elect to transition to a friend's home with the support of hospice as hospice will be able to manage draining the Pleurx catheter.  Melanie Trevino awaiting call back from [facility] admission staff.  BP (!) 115/49 (BP Location: Left Arm)   Pulse (!) 46   Temp 98.7 F (37.1 C) (Oral)   Resp 20   Ht 5' 4"  (1.626 m)   Wt 77.1 kg (169 lb 15.6 oz)   SpO2 96%   BMI 29.18 kg/m  Labs and imaging noted  Awake alert S1 S2 Shallow clear breath sounds anteriorly, diminished both bases Abdomen soft Trace edema Dementia at baseline.  PPS 30-40%  Family continues to struggle with how best to support the patient and her wishes. She has caregivers at home but family remains concerned about the level of care she will need moving forward.   S/p pleurex placement.  We discussed today again her options for home with hospice versus potential for friend's home with hospice on discharge.   Family to reach out again to [facility], but likely plan will be home with hospice.  Appreciate CSW assistance.  I called and spoke with [place] liaison to ask her to re-engage with family per their request.  D/w bedside RN, Dr. Karleen Hampshire, daughter in law, Melanie Trevino from [facility] and social work     Total  time: 40 minutes Greater than 50%  of this time was spent counseling and coordinating care related to the above assessment and plan.  Micheline Rough, MD [place] Team [phone]

## 2017-11-06 NOTE — Progress Notes (Signed)
LCSW following for disposition.  LCSW followed up with Joellen Jersey from [facility]. At this time they are not able to make a bed offer to patient.   Joellen Jersey has notified Mickel Baas, patient's daughter in law.   LCSW notified RNCM and Amy from [place].   Carolin Coy Rossiter [place]

## 2017-11-06 NOTE — Progress Notes (Signed)
PROGRESS NOTE    Melanie Trevino  KYH:[id number] DOB: August 25, 1928 DOA: 10/13/2017 PCP: Wenda Low, MD   Brief Narrative: Melanie Trevino is an 84 y.o. 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 2u PRBCs were transfused and thoracentesis 12/12 yielded 1.8L dark fluid found to be exudative with negative cytology and culture. She was started on levaquin and diuresed but effusion recurred. Modified barium swallow demonstrated no evidence of aspiration but CT chest demonstrated bilateralpneumonia, with fluid in the airways suggesting aspiration. Her antibiotics were changed to cover aspiration pneumonia on 12/16. Pulmonology and palliative care were consulted. Full course of antibiotics has been completed though pleural effusion continues to recur, requiring thoracentesis x3 and repeat imaging showing progressive effusion on 1/1. IR is consulted for pleur-x catheter placement.    Patient underwent right-sided Pleurx catheter today by IR.   Assessment & Plan:   Principal Problem:   Pleural effusion Active Problems:   Breast cancer (HCC)   Dementia of the Alzheimer's type   Gastrointestinal stromal tumor (GIST) (HCC)   Wheezing   Pressure injury of skin   Acute respiratory failure with hypoxia secondary to recurrent idiopathic right-sided pleural effusion. So far negative cytology and negative cultures on initial thoracentesis.  After which patient had to repeat thoracentesis 1 on 12/24 and  on 12/29.  IR consulted for a Pleurx catheter and was placed today. Palliative care consulted and recommendations given , plan for d.c home with home hospice in am.    Mild acute on chronic diastolic heart failure Resolved Currently on Lasix 40 daily we will continue with the same.   Pneumobilia incidentally noted on CT  currently asymptomatic.   Anemia of chronic disease FOBT is negative Anemia panel reviewed Status post 2 units of PRBC transfusion Hemoglobin currently stable   Hypokalemia Replaced   Hypertension Well-controlled   Dementia chronic stable Resume Aricept   History of GIST with liver metastases Gleevec resumed.    Paroxysmal atrial fibrillation currently in normal sinus rhythm  DVT prophylaxis: Lovenox Code Status: (DNR Family Communication: Discussed with daughter-in-law today Disposition Plan: home with home hospice in am.   Consultants:   Palliative care consult  IR\  Pulmonology  Procedures: Pleurx catheter placement by IR today Thoracentesis Echocardiogram Antimicrobials: None  Subjective: No new complaints.  Objective: Vitals:   11/06/17 0500 11/06/17 0617 11/06/17 0917 11/06/17 1421  BP:  131/65  (!) 115/49  Pulse:  (!) 44  (!) 46  Resp:  20  20  Temp:  98.5 F (36.9 C)  98.7 F (37.1 C)  TempSrc:  Oral  Oral  SpO2:  97% 96% 99%  Weight: 77.1 kg (169 lb 15.6 oz)     Height:        Intake/Output Summary (Last 24 hours) at 11/06/2017 1743 Last data filed at 11/06/2017 0800 Gross per 24 hour  Intake 240 ml  Output -  Net 240 ml   Filed Weights   11/04/17 0500 11/05/17 0602 11/06/17 0500  Weight: 80.1 kg (176 lb 8 oz) 77.7 kg (171 lb 4.8 oz) 77.1 kg (169 lb 15.6 oz)    Examination:  General exam: Appears calm and comfortable  Respiratory system: Clear to auscultation. Respiratory effort normal. Cardiovascular system: S1 & S2 heard, RRR. No JVD, murmurs, rubs, gallops or clicks. No pedal  edema. Gastrointestinal system: Abdomen is nondistended, soft and nontender. No organomegaly or masses felt. Normal bowel sounds heard. Central nervous system: Alert and confused.  Extremities: Symmetric 5 x 5 power. Skin: No rashes, lesions or ulcers Psychiatry:Mood & affect appropriate.      Data Reviewed: I have personally reviewed following  labs and imaging studies  CBC: Recent Labs  Lab 10/31/17 0559 11/05/17 0949  WBC 3.2* 3.7*  NEUTROABS  --  2.7  HGB 9.6* 9.5*  HCT 31.3* 30.2*  MCV 97.8 95.3  PLT 220 614   Basic Metabolic Panel: Recent Labs  Lab 11/01/17 0608 11/02/17 0552 11/03/17 0600 11/04/17 0541 11/05/17 0539 11/06/17 0558  NA 143 142 142 144 142 142  K 3.5 3.5 3.9 3.8 3.8 4.3  CL 102 102 103 104 104 103  CO2 35* 35* 35* 34* 33* 33*  GLUCOSE 92 96 96 89 94 93  BUN 27* 25* 24* 25* 24* 25*  CREATININE 0.70 0.64 0.85 0.79 0.78 0.99  CALCIUM 8.1* 8.2* 8.2* 8.4* 8.5* 8.4*  MG 2.1  --   --   --   --   --    GFR: Estimated Creatinine Clearance: 40.3 mL/min (by C-G formula based on SCr of 0.99 mg/dL). Liver Function Tests: No results for input(s): AST, ALT, ALKPHOS, BILITOT, PROT, ALBUMIN in the last 168 hours. No results for input(s): LIPASE, AMYLASE in the last 168 hours. No results for input(s): AMMONIA in the last 168 hours. Coagulation Profile: Recent Labs  Lab 11/04/17 0843  INR 1.15   Cardiac Enzymes: No results for input(s): CKTOTAL, CKMB, CKMBINDEX, TROPONINI in the last 168 hours. BNP (last 3 results) No results for input(s): PROBNP in the last 8760 hours. HbA1C: No results for input(s): HGBA1C in the last 72 hours. CBG: No results for input(s): GLUCAP in the last 168 hours. Lipid Profile: No results for input(s): CHOL, HDL, LDLCALC, TRIG, CHOLHDL, LDLDIRECT in the last 72 hours. Thyroid Function Tests: No results for input(s): TSH, T4TOTAL, FREET4, T3FREE, THYROIDAB in the last 72 hours. Anemia Panel: No results for input(s): VITAMINB12, FOLATE, FERRITIN, TIBC, IRON, RETICCTPCT in the last 72 hours. Sepsis Labs: No results for input(s): PROCALCITON, LATICACIDVEN in the last 168 hours.  No results found for this or any previous visit (from the past 240 hour(s)).       Radiology Studies: Ir Perc Athena Masse Perit Cath [facility]  Result Date: 11/06/2017 CLINICAL DATA:  Recurrent right  pleural effusion requiring recent large volume thoracentesis procedures. EXAM: INSERTION OF TUNNELED PLEURAL DRAINAGE CATHETER ANESTHESIA/SEDATION: Moderate (conscious) sedation was employed during this procedure. A total of Versed 0.5 mg and Fentanyl 25 mcg was administered intravenously. Moderate Sedation Time: 20 minutes. The patient's level of consciousness and vital signs were monitored continuously by radiology nursing throughout the procedure under my direct supervision. MEDICATIONS: 2 g IV Ancef. Antibiotic was administered in an appropriate time interval for the procedure. FLUOROSCOPY TIME:  12 seconds.  1.3 mGy. PROCEDURE: The procedure, risks, benefits, and alternatives were explained to the patient's daughter as the patient has underlying dementia. Questions regarding the procedure were encouraged and answered. The patient's daughter understands and consents to the procedure. The right chest wall was prepped with Betadine in a sterile fashion, and a sterile drape was applied covering the operative field. A sterile gown and sterile gloves were used for the procedure. Local anesthesia was provided with 1% Lidocaine. Ultrasound image documentation was performed. Fluoroscopy during the procedure and fluoroscopic spot radiograph confirms appropriate catheter  position. After creating a small skin incision, a 19 gauge needle was advanced into the pleural cavity under ultrasound guidance. A guide wire was then advanced under fluoroscopy into the pleural space. Pleural access was dilated serially and a 16-French peel-away sheath placed. A 15.5 French tunneled PleurX catheter was placed. This was tunneled from an incision 5 cm below the pleural access to the access site. The catheter was advanced through the peel-away sheath. The sheath was then removed. Final catheter positioning was confirmed with a fluoroscopic spot image. The access incision was closed with subcutaneous 3-0 Monocryl and subcuticular 4-0  Vicryl. Dermabond was applied to the incision. A Prolene retention suture was applied at the catheter exit site. Large volume thoracentesis was performed through the new catheter utilizing a vacuum bottle. COMPLICATIONS: None. FINDINGS: The catheter was placed via the right lateral chest wall. Catheter course is towards the apex in the basilar and medial pleural space. Approximately 1.2 liters of grossly bloody pleural fluid was able to be removed after catheter placement. IMPRESSION: Placement of permanent, tunneled right pleural drainage catheter via lateral approach. 1.2 liters of pleural fluid was removed today after catheter placement. Electronically Signed   By: Aletta Edouard M.D.   On: 11/06/2017 08:32        Scheduled Meds: . docusate sodium  100 mg Oral BID  . donepezil  10 mg Oral QHS  . enoxaparin (LOVENOX) injection  40 mg Subcutaneous Q24H  . fluconazole  150 mg Oral Q72H  . fluticasone  1 spray Each Nare Daily  . furosemide  40 mg Oral Daily  . Gerhardt's butt cream   Topical TID  . imatinib  400 mg Oral Daily  . ipratropium-albuterol  3 mL Nebulization BID  . mirabegron ER  50 mg Oral QHS  . potassium chloride  40 mEq Oral Daily  . vitamin B-12  1,000 mcg Oral BID   Continuous Infusions:   LOS: 24 days    Time spent: 35 minutes.     Hosie Poisson, MD [facility] Pager [phone]  If 7PM-7AM, please contact night-coverage [email or web address] Password [facility] 11/06/2017, 5:43 PM

## 2017-11-07 LAB — BASIC METABOLIC PANEL
Anion gap: 4 — ABNORMAL LOW (ref 5–15)
BUN: 24 mg/dL — ABNORMAL HIGH (ref 6–20)
CALCIUM: 8 mg/dL — AB (ref 8.9–10.3)
CO2: 32 mmol/L (ref 22–32)
Chloride: 103 mmol/L (ref 101–111)
Creatinine, Ser: 0.99 mg/dL (ref 0.44–1.00)
GFR calc Af Amer: 58 mL/min — ABNORMAL LOW (ref >60)
GFR, EST NON AFRICAN AMERICAN: 50 mL/min — AB (ref >60)
Glucose, Bld: 99 mg/dL (ref 65–99)
POTASSIUM: 4 mmol/L (ref 3.5–5.1)
Sodium: 139 mmol/L (ref 135–145)

## 2017-11-07 MED ORDER — FLUCONAZOLE 150 MG PO TABS: 150.0000 mg | ORAL_TABLET | ORAL | 0 refills | Status: AC

## 2017-11-07 MED ORDER — BENZONATATE 200 MG PO CAPS: 200.0000 mg | ORAL_CAPSULE | Freq: Three times a day (TID) | ORAL | 0 refills | Status: AC | PRN

## 2017-11-07 MED ORDER — FUROSEMIDE 40 MG PO TABS: 40.0000 mg | ORAL_TABLET | Freq: Every day | ORAL | 0 refills | Status: AC

## 2017-11-07 NOTE — Progress Notes (Signed)
Pt d/c home w/ hospice. PTAR to transport. Family at bedside and reviewed AVS and follow up care. Information provided on Pleurx drain per family request. IV removed.  No further questions. Kizzie Ide, RN

## 2017-11-07 NOTE — Care Management Note (Signed)
Case Management Note  Patient Details  Name: Melanie Trevino MRN: [id number] Date of Birth: 09/13/1928                   Action/Plan: Discharge Planning: Received call from UNIT RN will need PTAR and oxygen. [place] to follow up and oxygen was ordered with [facility]. They will deliver a concentrator to home. Unit RN will call for PTAR. Gold DNR form complete and PTAR paperwork on shadow chart. Faxed Pleural Cath Rx to [facility]. Original Rx sent home with pt for [facility] to follow up on drain kits.   PCP Wenda Low MD  Expected Discharge Date:  11/07/17               Expected Discharge Plan:  Home w Hospice Care  In-House Referral:  Clinical Social Work  Discharge planning Services  CM Consult  Post Acute Care Choice:  Hospice Choice offered to:  Adult Children  DME Arranged:  Oxygen DME Agency:  [place] Arranged:  RN [facility] Agency:  [facility]  Status of Service:  Completed, signed off  If discussed at [place] of Stay Meetings, dates discussed:    Additional Comments:  Erenest Rasher, RN 11/07/2017, 12:46 PM

## 2017-11-08 DIAGNOSIS — E785 Hyperlipidemia, unspecified: Secondary | ICD-10-CM | POA: Diagnosis not present

## 2017-11-08 DIAGNOSIS — I10 Essential (primary) hypertension: Secondary | ICD-10-CM | POA: Diagnosis not present

## 2017-11-08 DIAGNOSIS — J918 Pleural effusion in other conditions classified elsewhere: Secondary | ICD-10-CM | POA: Diagnosis not present

## 2017-11-08 DIAGNOSIS — I25810 Atherosclerosis of coronary artery bypass graft(s) without angina pectoris: Secondary | ICD-10-CM | POA: Diagnosis not present

## 2017-11-08 DIAGNOSIS — C49A0 Gastrointestinal stromal tumor, unspecified site: Secondary | ICD-10-CM | POA: Diagnosis not present

## 2017-11-08 DIAGNOSIS — N183 Chronic kidney disease, stage 3 (moderate): Secondary | ICD-10-CM | POA: Diagnosis not present

## 2017-11-08 DIAGNOSIS — C787 Secondary malignant neoplasm of liver and intrahepatic bile duct: Secondary | ICD-10-CM | POA: Diagnosis not present

## 2017-11-08 DIAGNOSIS — G309 Alzheimer's disease, unspecified: Secondary | ICD-10-CM | POA: Diagnosis not present

## 2017-11-08 DIAGNOSIS — I4891 Unspecified atrial fibrillation: Secondary | ICD-10-CM | POA: Diagnosis not present

## 2017-11-08 DIAGNOSIS — I509 Heart failure, unspecified: Secondary | ICD-10-CM | POA: Diagnosis not present

## 2017-11-08 DIAGNOSIS — C50919 Malignant neoplasm of unspecified site of unspecified female breast: Secondary | ICD-10-CM | POA: Diagnosis not present

## 2017-11-09 DIAGNOSIS — J918 Pleural effusion in other conditions classified elsewhere: Secondary | ICD-10-CM | POA: Diagnosis not present

## 2017-11-09 DIAGNOSIS — I25810 Atherosclerosis of coronary artery bypass graft(s) without angina pectoris: Secondary | ICD-10-CM | POA: Diagnosis not present

## 2017-11-09 DIAGNOSIS — C49A0 Gastrointestinal stromal tumor, unspecified site: Secondary | ICD-10-CM | POA: Diagnosis not present

## 2017-11-09 DIAGNOSIS — G309 Alzheimer's disease, unspecified: Secondary | ICD-10-CM | POA: Diagnosis not present

## 2017-11-09 DIAGNOSIS — C787 Secondary malignant neoplasm of liver and intrahepatic bile duct: Secondary | ICD-10-CM | POA: Diagnosis not present

## 2017-11-09 DIAGNOSIS — C50919 Malignant neoplasm of unspecified site of unspecified female breast: Secondary | ICD-10-CM | POA: Diagnosis not present

## 2017-11-09 NOTE — Discharge Summary (Addendum)
Physician Discharge Summary  Melanie Trevino KGU:[id number] DOB: [birth year] DOA: 10/13/2017  PCP: Melanie Low, MD  Admit date: 10/13/2017 Discharge date: 11/07/2017  Admitted From: HOME.  Disposition:  HOME HOSPICE   Recommendations for Outpatient Follow-up:  1. Follow up with PCP in 1-2 weeks 2. Follow up with hospice MD as needed.   Discharge Condition:Home hospice.  CODE STATUS:DNR.  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 2u PRBCs were transfused and thoracentesis 12/12 yielded 1.8L dark fluid found to be exudative with negative cytology and culture. She was started on levaquin and diuresed but effusion recurred. Modified barium swallow demonstrated no evidence of aspiration but CT chest demonstrated bilateralpneumonia, with fluid in the airways suggesting aspiration. Her antibiotics were changed to cover aspiration pneumonia on 12/16. Pulmonology and palliative care were consulted. Full course of antibiotics has been completed though pleural effusion continues to recur, requiring thoracentesis x3 and repeat imaging showing progressive effusion on 1/1. IR is consulted for pleur-x catheter placement.   Patient underwent right-sided Pleurx catheter  by IR.    Discharge Diagnoses:  Principal Problem:   Pleural effusion Active Problems:   Breast cancer (HCC)   Dementia of the Alzheimer's type   Gastrointestinal stromal tumor (GIST) ([place])   Wheezing Stage 3 CKD     Acute respiratory failure with hypoxia secondary to recurrent idiopathic right-sided pleural effusion. So far negative cytology and negative cultures on initial thoracentesis.  After which patient had to repeat thoracentesis 1 on 12/24 and  on 12/29.   IR consulted for a Pleurx catheter and was placed on 1/3. Palliative care consulted and recommendations given , plan for d.c home with home hospice.   Mild acute on chronic diastolic heart failure Resolved Currently on Lasix 40 daily we will continue with the same.    Pneumobilia incidentally noted on CT currently asymptomatic.   Anemia of chronic disease FOBT is negative Anemia panel reviewed Status post 2 units of PRBC transfusion Hemoglobin currently stable   Hypokalemia Replaced   Hypertension Well-controlled  Stage 3 CKD : Creatinine stable on discharge.    Dementia chronic stable Resume Aricept   History of GIST with liver metastases Gleevec stopped in view of transition to hospice. .    Paroxysmal atrial fibrillation currently in normal sinus rhythm    Discharge Instructions  Discharge Instructions    DME Nebulizer/meds   Complete by:  As directed    Patient needs a nebulizer to treat with the following condition:  Wheezing   Discharge instructions   Complete by:  As directed    Home with Home Hospice.  Follow up with Hospice RN for the pleurex catheter.     Allergies as of 11/07/2017      Reactions   Shellfish Allergy Anaphylaxis   Other Other (See Comments)   HORSE SERUMYDrug[Other] swelling6/09/2006 12:00:00 AM by Erin Hearing CPhT   Amoxicillin Swelling, Rash   Has patient had a PCN reaction causing immediate rash, facial/tongue/throat swelling, SOB or lightheadedness with hypotension: UNKNOWN Has patient had a PCN reaction causing severe rash involving mucus membranes or skin necrosis: Unknown Has patient had a PCN reaction that required hospitalization: Unknown Has patient had a PCN reaction occurring within the last 10 years: Unknown If all of  the above answers are "NO", then may proceed with Cephalosporin use.      Medication List    STOP taking these medications   ciprofloxacin 500 MG tablet Commonly known as:   CIPRO   GLEEVEC 400 MG tablet Generic drug:  imatinib   losartan 25 MG tablet Commonly known as:  COZAAR     TAKE these medications   albuterol 108 (90 Base) MCG/ACT inhaler Commonly known as:  PROVENTIL HFA;VENTOLIN HFA Inhale 2 puffs into the lungs every 4 (four) hours as needed for wheezing or shortness of breath (cough, shortness of breath or wheezing.).   ARNICARE ARTHRITIS PO Apply 1 application topically 4 (four) times daily as needed (for stiffness/pain.).   benzonatate 200 MG capsule Commonly known as:  TESSALON Take 1 capsule (200 mg total) by mouth 3 (three) times daily as needed for cough (if pt refractory to or intolerant of Robitussin DM).   CALCIUM 600+D 600-800 MG-UNIT Tabs Generic drug:  Calcium Carb-Cholecalciferol Take 1 tablet by mouth daily.   docusate sodium 100 MG capsule Commonly known as:  COLACE Take 100 mg by mouth 2 (two) times daily. Reported on 04/09/2016   donepezil 10 MG tablet Commonly known as:  ARICEPT Take 10 mg by mouth at bedtime.   fluconazole 150 MG tablet Commonly known as:  DIFLUCAN Take 1 tablet (150 mg total) by mouth every 3 (three) days.   fluticasone 50 MCG/ACT nasal spray Commonly known as:  FLONASE Place 1 spray into both nostrils daily.   furosemide 40 MG tablet Commonly known as:  LASIX Take 1 tablet (40 mg total) by mouth at bedtime.   ipratropium-albuterol 0.5-2.5 (3) MG/3ML Soln Commonly known as:  DUONEB Take 3 mLs by nebulization every 4 (four) hours as needed.   methocarbamol 500 MG tablet Commonly known as:  ROBAXIN Take 500 mg by mouth every 8 (eight) hours as needed for muscle spasms.   mirabegron ER 50 MG Tb24 tablet Commonly known as:  MYRBETRIQ Take 50 mg by mouth at bedtime.   potassium chloride SA 20 MEQ tablet Commonly known as:  K-DUR,KLOR-CON Take 1 tablet (20 mEq total) by mouth 2 (two) times daily.   Spacer/Aero-Holding Dorise Bullion Use with MDI as directed   vitamin B-12 1000 MCG  tablet Commonly known as:  CYANOCOBALAMIN Take 1,000 mcg by mouth 2 (two) times daily.   zinc oxide 11.3 % Crea cream Commonly known as:  BALMEX Apply 1 application topically 4 (four) times daily as needed (for barrier cream to buttocks area).            Durable Medical Equipment  (From admission, onward)        Start     Ordered   10/16/17 0000  DME Nebulizer/meds    Question:  Patient needs a nebulizer to treat with the following condition  Answer:  Wheezing   10/16/17 1005     Follow-up Information    Melanie Low, MD. Schedule an appointment as soon as possible for a visit in 1 week(s).   Specialty:  Internal Medicine Contact information: [address] [place] [address] [facility]. Schedule an appointment as soon as possible for a visit in 1 week(s).   Specialty:  Pulmonology Contact information: [address]       [place] Follow up.   Specialty:  Hospice and Palliative Medicine Why:  Hospice RN-  will contact to arrange initial visit Contact information: [place] [address] [phone]          Allergies  Allergen Reactions  . Shellfish Allergy Anaphylaxis  . Other Other (See Comments)    HORSE SERUMYDrug[Other] swelling6/09/2006 12:00:00 AM by Erin Hearing CPhT  . Amoxicillin Swelling and Rash    Has patient had a PCN reaction causing immediate rash, facial/tongue/throat swelling, SOB or lightheadedness with hypotension: UNKNOWN Has patient had a PCN reaction causing severe rash involving mucus membranes or skin necrosis: Unknown Has patient had a PCN reaction that required hospitalization: Unknown Has patient had a PCN reaction occurring within the last 10 years: Unknown If all of the above answers are "NO", then may proceed with Cephalosporin use.     Consultations:  Palliative care  consult  IR  Pulmonology       Procedures/Studies:   Thoracentesis.  Echocardiogram.  pleurex catheter placement.    Dg Chest 1 View  Result Date: 10/31/2017 CLINICAL DATA:  Status post thoracentesis. EXAM: CHEST 1 VIEW COMPARISON:  10/29/2017 FINDINGS: Stable cardiac enlargement. Aortic atherosclerosis. Decrease in volume of right pleural effusion status post thoracentesis. No pneumothorax identified. Atelectasis noted in the lung bases. There is advanced degenerative changes involving both glenohumeral joints. IMPRESSION: 1. No pneumothorax status post right thoracentesis. Electronically Signed   By: Kerby Moors M.D.   On: 10/31/2017 12:19   Dg Chest 1 View  Result Date: 10/26/2017 CLINICAL DATA:  Status post RIGHT thoracentesis EXAM: CHEST 1 VIEW COMPARISON:  10/24/2017 FINDINGS: reduction in RIGHT pleural fluid following thoracentesis. No pneumothorax identified. Persistent small bilateral pleural effusions and atelectasis. Severe degenerate change in the RIGHT shoulder joint. IMPRESSION: 1. No apparent pneumothorax following RIGHT thoracentesis. 2. Reduction in RIGHT pleural fluid volume. Electronically Signed   By: Suzy Bouchard M.D.   On: 10/26/2017 11:36   Dg Chest 1 View  Result Date: 10/24/2017 CLINICAL DATA:  Hypoxia. EXAM: CHEST 1 VIEW COMPARISON:  Radiograph of October 22, 2017. FINDINGS: Stable cardiomegaly. Atherosclerosis of thoracic aorta is noted. No pneumothorax is noted. Stable left basilar atelectasis or infiltrate is noted with mild left pleural effusion. Stable moderate probable loculated right pleural effusion is noted, with associated atelectasis of the adjacent right lower lobe. Bony thorax is unremarkable. IMPRESSION: Stable left basilar atelectasis or infiltrate is noted with mild left pleural effusion. Stable moderate-sized probable loculated right pleural effusion is noted with associated atelectasis adjacent right lower lobe. Electronically Signed    By: Marijo Conception, M.D.   On: 10/24/2017 07:47   Dg Chest 1 View  Result Date: 10/22/2017 CLINICAL DATA:  Cough.  Pleural effusions. EXAM: CHEST 1 VIEW COMPARISON:  Chest x-rays dated 10/19/2017 and 10/18/2017 and CT scan dated 10/18/2017 FINDINGS: The right pleural effusion appears more prominent although this may be positional. Small left effusion process. Increased consolidation at the left lung base as indicated by the loss of air bronchograms. Pulmonary vascularity is normal. Heart size is unchanged in appears slightly prominent. Aortic atherosclerosis. No acute bone abnormality. Fairly severe arthritic changes of both shoulders. Surgical clips to the right of the trachea just above the thoracic inlet consistent with previous thyroid surgery. IMPRESSION: 1. Right pleural effusion appears slightly more prominent. 2. Increased consolidation at the left lung base. 3. Persistent small left effusion. 4. Aortic atherosclerosis. Electronically Signed   By: Lorriane Shire M.D.   On: 10/22/2017 10:59   Dg Chest 1 View  Result Date: 10/14/2017 CLINICAL DATA:  Right-sided  thoracentesis for pleural effusion. History of breast cancer and gastrointestinal stromal tumor. EXAM: CHEST 1 VIEW COMPARISON:  10/13/2017 FINDINGS: Heart is top normal with thoracic aortic atherosclerosis. Near complete clearing of right-sided pleural effusion without pneumothorax status post right-sided thoracentesis. Probable tiny left pleural effusion blunting the left lateral costophrenic angle. Bibasilar and left mid lung atelectasis. Osteoarthritis of the AC glenohumeral joints. No suspicious osseous lesions. Surgical clips are seen in the expected location of right thyroid gland. IMPRESSION: 1. Near complete resolution of right sided pleural effusion. 2. No pneumothorax is identified. 3. Bibasilar and left mid lung atelectasis. Probable tiny left pleural effusion. 4. Stable cardiomegaly with aortic atherosclerosis. Electronically  Signed   By: Ashley Royalty M.D.   On: 10/14/2017 15:50   Dg Chest 2 View  Result Date: 10/19/2017 CLINICAL DATA:  Follow-up pleural effusion. EXAM: CHEST  2 VIEW COMPARISON:  10/18/2017 CT and chest radiograph, and prior studies. FINDINGS: A moderate to large right pleural effusion and small left pleural effusion are again noted. Moderate right mid and lower lung atelectasis/ consolidation and mild left basilar atelectasis are unchanged. There is no evidence of pneumothorax. No acute bony abnormalities are identified. Severe degenerative changes in both shoulders again noted. IMPRESSION: Unchanged appearance of the chest with moderate to large right pleural effusion, moderate right mid and lower lung atelectasis/consolidation, small left pleural effusion and mild left basilar atelectasis. Electronically Signed   By: Margarette Canada M.D.   On: 10/19/2017 14:38   Dg Chest 2 View  Result Date: 10/13/2017 CLINICAL DATA:  84 year old female with wheezing and productive cough. Initial encounter. EXAM: CHEST  2 VIEW COMPARISON:  07/28/2016. FINDINGS: Large right-sided pleural effusion. Suspect associated right middle lobe and right lower lobe atelectasis. Cannot exclude lower lobe infiltrate or mass. Cardiomegaly. Pulmonary vascular prominence greater centrally. Calcified tortuous aorta. Bilateral shoulder joint degenerative changes. Surgical clips right thyroid bed region. IMPRESSION: Large right-sided pleural effusion. Suspect associated right middle lobe and right lower lobe atelectasis. Cannot exclude lower lobe infiltrate or mass. Cardiomegaly. Aortic Atherosclerosis (ICD10-I70.0). Electronically Signed   By: Genia Del M.D.   On: 10/13/2017 18:23   Dg Abd 1 View  Result Date: 10/22/2017 CLINICAL DATA:  Nausea and vomiting. EXAM: ABDOMEN - 1 VIEW COMPARISON:  CT scan of the chest dated 10/18/2017 FINDINGS: There is contrast in nondistended loops of large and small bowel. No visible free air in the abdomen.  Air is seen in the biliary tree. Bilateral pleural effusions, right greater than left. No acute bone abnormality. Severe arthritis of the right hip. Left total hip prosthesis. IMPRESSION: No acute abnormality of the abdomen. No evidence of bowel obstruction. Air in the biliary tree as previously described on the chest CT scan of 10/18/2017. Electronically Signed   By: Lorriane Shire M.D.   On: 10/22/2017 10:55   Ct Chest Wo Contrast  Result Date: 10/30/2017 CLINICAL DATA:  Pleural effusion. Shortness of breath. History of gist tumor with liver metastases EXAM: CT CHEST WITHOUT CONTRAST TECHNIQUE: Multidetector CT imaging of the chest was performed following the standard protocol without IV contrast. COMPARISON:  10/18/2017 FINDINGS: Cardiovascular: There is moderate cardiac enlargement. Aortic atherosclerosis identified. Calcification in the left main coronary artery, LAD, left circumflex and RCA noted. No pericardial effusion. Mediastinum/Nodes: The trachea appears patent and is midline. Patulous of esophagus appears increased in caliber. Trevino-attenuation nodule in the left lobe of thyroid gland measures 2 cm, image 25 of series 2. No mediastinal or hilar adenopathy. No supraclavicular or axillary  adenopathy. Lungs/Pleura: Moderate to large right pleural effusion. Small left effusion. There is compressive type atelectasis and consolidation involving the entire right lower lobe and portions of the right middle lobe. There is also compressive type atelectasis/ consolidation within the left base. There is a 5 mm subpleural nodule within the lingula. Upper Abdomen: No acute abnormality identified. Pneumobilia is again noted compatible with biliary patency. There is a large ventral abdominal wall hernia which contains nonobstructed loops of large bowel. Musculoskeletal: Degenerative disc disease is identified throughout the thoracic spine. Advanced osteoarthritis involves both glenohumeral joints. IMPRESSION: 1.  Moderate to large right pleural effusion. There is associated decreased aeration to the right middle lobe and entire right lower lobe. 2. Small left effusion. 3. Lingular nodule measures 5 mm. No follow-up needed if patient is Trevino-risk. Non-contrast chest CT can be considered in 12 months if patient is high-risk. This recommendation follows the consensus statement: Guidelines for Management of Incidental Pulmonary Nodules Detected on CT Images: From the Fleischner Society 2017; Radiology 2017; 284:228-243. 4. Aortic Atherosclerosis (ICD10-I70.0). Three vessel coronary artery calcifications including left main disease. 5. Large ventral abdominal wall hernia 6. Pneumobilia. Electronically Signed   By: Kerby Moors M.D.   On: 10/30/2017 13:23   Ct Chest Wo Contrast  Result Date: 10/18/2017 CLINICAL DATA:  Breast cancer.  Pleural effusion. EXAM: CT CHEST WITHOUT CONTRAST TECHNIQUE: Multidetector CT imaging of the chest was performed following the standard protocol without IV contrast. COMPARISON:  None. FINDINGS: Cardiovascular: Atherosclerotic calcifications of the thoracic aorta. Advanced coronary artery calcifications of all 3 vessels. No evidence of aortic aneurysm. Maximal diameter of the ascending aorta is 3.8 cm. Mediastinum/Nodes: 13 mm short axis diameter precarinal lymph node. No significant pericardial effusion. 2.0 cm left thyroid hypodensity. The esophagus is mildly distended. Lungs/Pleura: Moderate bilateral pleural effusions right greater than left. There is patchy airspace disease within the dependent right lung. Subsegmental atelectasis in the left lung. Mild patchy opacities in the anterior left upper lobe. There is a small amount of fluid layering in the right mainstem bronchus. Upper Abdomen: There is extensive arborized gas throughout the liver. There is a large amount of gas in the gallbladder. These findings would suggest that this is pneumobilia. However, gas does extend portal liver  capsule and portal venous gas cannot be entirely excluded. Pneumobilia is favored. Gas bubbles adjacent of the gallbladder are favored represent gas within intestine. Large abdominal wall hernia contains loops of bowel. There is diffuse subcutaneous edema within the fat of the visualized chest and abdomen. Musculoskeletal: In L1 compression deformity is partially imaged. There is a lytic area within the superior endplate which most likely represents a depressed component of the fracture. Lytic lesion cannot be excluded. IMPRESSION: Patchy airspace opacities in both lungs worrisome for pneumonia. Fluid in the airways suggests aspiration. Bilateral pleural effusions right greater than left Nonspecific gas in the liver. Pneumobilia is favored over portal venous gas. Correlate with a history of previous biliary instrumentation. After discussion with the attending physician, the patient has no abdominal symptoms and therefore again, pneumobilia is favored. L1 compression fracture as described. Pathologic fracture is not excluded. 2.0 cm left thyroid hypodensity.  Thyroid ultrasound is recommended. Aortic Atherosclerosis (ICD10-I70.0). Electronically Signed   By: Marybelle Killings M.D.   On: 10/18/2017 12:09   Ir Perc Athena Masse Perit Cath Wo Port  Result Date: 11/06/2017 CLINICAL DATA:  Recurrent right pleural effusion requiring recent large volume thoracentesis procedures. EXAM: INSERTION OF TUNNELED PLEURAL DRAINAGE CATHETER ANESTHESIA/SEDATION: Moderate (conscious)  sedation was employed during this procedure. A total of Versed 0.5 mg and Fentanyl 25 mcg was administered intravenously. Moderate Sedation Time: 20 minutes. The patient's level of consciousness and vital signs were monitored continuously by radiology nursing throughout the procedure under my direct supervision. MEDICATIONS: 2 g IV Ancef. Antibiotic was administered in an appropriate time interval for the procedure. FLUOROSCOPY TIME:  12 seconds.  1.3 mGy.  PROCEDURE: The procedure, risks, benefits, and alternatives were explained to the patient's daughter as the patient has underlying dementia. Questions regarding the procedure were encouraged and answered. The patient's daughter understands and consents to the procedure. The right chest wall was prepped with Betadine in a sterile fashion, and a sterile drape was applied covering the operative field. A sterile gown and sterile gloves were used for the procedure. Local anesthesia was provided with 1% Lidocaine. Ultrasound image documentation was performed. Fluoroscopy during the procedure and fluoroscopic spot radiograph confirms appropriate catheter position. After creating a small skin incision, a 19 gauge needle was advanced into the pleural cavity under ultrasound guidance. A guide wire was then advanced under fluoroscopy into the pleural space. Pleural access was dilated serially and a 16-French peel-away sheath placed. A 15.5 French tunneled PleurX catheter was placed. This was tunneled from an incision 5 cm below the pleural access to the access site. The catheter was advanced through the peel-away sheath. The sheath was then removed. Final catheter positioning was confirmed with a fluoroscopic spot image. The access incision was closed with subcutaneous 3-0 Monocryl and subcuticular 4-0 Vicryl. Dermabond was applied to the incision. A Prolene retention suture was applied at the catheter exit site. Large volume thoracentesis was performed through the new catheter utilizing a vacuum bottle. COMPLICATIONS: None. FINDINGS: The catheter was placed via the right lateral chest wall. Catheter course is towards the apex in the basilar and medial pleural space. Approximately 1.2 liters of grossly bloody pleural fluid was able to be removed after catheter placement. IMPRESSION: Placement of permanent, tunneled right pleural drainage catheter via lateral approach. 1.2 liters of pleural fluid was removed today after  catheter placement. Electronically Signed   By: Aletta Edouard M.D.   On: 11/06/2017 08:32   Dg Chest Port 1 View  Result Date: 11/03/2017 CLINICAL DATA:  Pleural effusion EXAM: PORTABLE CHEST 1 VIEW COMPARISON:  11/02/2017 FINDINGS: Progression of bilateral pleural effusions right greater than left. Progression of bibasilar airspace disease which may be atelectasis or pneumonia. No definite edema. Atherosclerotic aorta. Cardiac enlargement. Advanced degenerative change in both shoulders IMPRESSION: Progressive bilateral pleural effusion and bibasilar airspace disease, right greater than left Electronically Signed   By: Franchot Gallo M.D.   On: 11/03/2017 06:30   Dg Chest Port 1 View  Result Date: 11/02/2017 CLINICAL DATA:  Follow-up right pleural effusion. EXAM: PORTABLE CHEST 1 VIEW COMPARISON:  Portable chest x-ray of October 31, 2017 FINDINGS: The lungs are reasonably well inflated. There are small bilateral pleural effusions which appears stable. There is no pneumothorax. The right perihilar and infrahilar regions remain dense. The cardiac silhouette is enlarged. The pulmonary vascularity is not clearly engorged. There is calcification in the wall of the thoracic aorta. There is multilevel degenerative disc disease of the thoracic spine. There is new moderate to severe degenerative change of both shoulders. IMPRESSION: No recurrent pneumothorax. Small bilateral pleural effusions, stable. Persistent right lower lobe atelectasis or pneumonia Stable cardiomegaly without significant pulmonary vascular congestion. Thoracic aortic atherosclerosis. Electronically Signed   By: David  Martinique M.D.  On: 11/02/2017 07:12   Dg Chest Port 1 View  Result Date: 10/29/2017 CLINICAL DATA:  84 year old female with a history of pleural effusion EXAM: PORTABLE CHEST 1 VIEW COMPARISON:  11/26/2016, 10/24/2017 FINDINGS: Cardiomediastinal silhouette unchanged with cardiomegaly. Calcifications of the aortic arch.  Recurrence of peripheral basilar opacity on the right with obscuration the right hemidiaphragm and veiled opacity at the right lung base. Thickening of the minor fissure. No pneumothorax. Opacity at the left base with blunting of the left costophrenic angle. No pneumothorax. Surgical changes of the neck IMPRESSION: Recurrent right-sided pleural effusion, potentially partially loculated. Opacity at the left base persists, likely small pleural effusion. Cardiomegaly and aortic calcifications. Electronically Signed   By: Corrie Mckusick D.O.   On: 10/29/2017 15:18   Dg Chest Port 1 View  Result Date: 10/18/2017 CLINICAL DATA:  84 year old female with shortness of breath an generalized weakness. Metastatic GIST. EXAM: PORTABLE CHEST 1 VIEW COMPARISON:  10/16/2017 and earlier. FINDINGS: Portable AP semi upright view at [id number] hours. Increasing fluid or thickening in the right minor fissure and mild progression of veiling opacity in the right lung since 10/16/2017. Increased confluent opacity at the left lung base since 10/14/2017. Left hemidiaphragm now mostly obscured. Stable cardiomegaly and mediastinal contours. Calcified aortic atherosclerosis. No pneumothorax. No acute pulmonary edema. Asymmetric obscuration of the right mainstem bronchus. Paucity bowel gas in the upper abdomen. IMPRESSION: 1. Increasing right pleural effusion with fluid now in the right minor fissure. 2. Asymmetric obscuration of the right mainstem bronchus suspicious for superimposed right side mucous plugging/atelectasis. 3. Increased left lung base opacity since 10/14/2017 compatible with left lower lobe atelectasis or consolidation. Electronically Signed   By: [name] M.D.   On: 10/18/2017 06:51   Dg Chest Port 1 View  Result Date: 10/16/2017 CLINICAL DATA:  Pleural effusion and cough. EXAM: PORTABLE CHEST 1 VIEW COMPARISON:  One-view chest x-ray 10/14/2017 aunt two-view chest x-ray 10/13/2017 FINDINGS: Heart is enlarged. Atherosclerotic  calcifications are present at the aortic arch. Bilateral lower lobe airspace disease has increased. Superior segment right lower lobe or right upper lobe airspace disease is now present as well. There is moderate pulmonary vascular congestion. The right pleural effusion is reaccumulating. A left pleural effusion has increased. IMPRESSION: 1. Progressive bilateral airspace disease/pneumonia. 2. Increasing bilateral pleural effusions. 3.  Aortic Atherosclerosis (ICD10-I70.0). Electronically Signed   By: San Morelle M.D.   On: 10/16/2017 10:36   Dg Swallowing Func-speech Pathology  Result Date: 10/19/2017 Objective Swallowing Evaluation: Type of Study: MBS-Modified Barium Swallow Study  Patient Details Name: ARIEONNA MEDINE MRN: [id number] Date of Birth: 04-21-1928 Today's Date: 10/19/2017 Time: SLP Start Time (ACUTE ONLY): 1340 -SLP Stop Time (ACUTE ONLY): 1400 SLP Time Calculation (min) (ACUTE ONLY): 20 min Past Medical History: Past Medical History: Diagnosis Date . Breast cancer ([place]) 08/18/2012 . Cancer ([place])  . Chronic indwelling Foley catheter 07/08/2015 . Dementia of the Alzheimer's type 07/08/2015 . 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 High cholesterol  . Hypertension  . Liver metastasis ([place]) 07/08/2015 . Renal mass 07/08/2015 . Wheelchair bound 07/08/2015 Past Surgical History: Past Surgical History: Procedure Laterality Date . 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  She had been making gurgling noises in her chest and wheezing for several days prior to admission. Chest x-ray demonstrated a large right-sided  pleural effusion. Per MD concern for aspiration PNA. Chest CT 10/18/17: Patchy airspace opacities in both lungs worrisome for pneumonia, fluid in the airways suggests aspiration. BSE results 10/18/17 recommended MBS to rule out silent aspiration.  Subjective: Pt seen in radiology for objective assessment of swallow function and safety, and to rule out silent aspiration Assessment / Plan / Recommendation CHL IP CLINICAL IMPRESSIONS 10/19/2017 Clinical Impression Pt presents with normal oropharyngeal swallow.  No oral issues. Timely swallow reflex, no penetration or aspiration, and no post-swallow residue noted. Recommend continuing with regular diet and thin liquids. Safe swallow precautions sent to room with pt/transport. SLP Visit Diagnosis Dysphagia, oropharyngeal phase (R13.12) Impact on safety and function Mild aspiration risk   CHL IP TREATMENT RECOMMENDATION 10/19/2017 Treatment Recommendations No treatment recommended at this time   Prognosis 10/19/2017 Prognosis for Safe Diet Advancement Good CHL IP DIET RECOMMENDATION 10/19/2017 SLP Diet Recommendations Regular solids;Thin liquid Liquid Administration via Cup;Straw Medication Administration Whole meds with liquid Compensations Minimize environmental distractions;Slow rate;Small sips/bites Postural Changes Seated upright at 90 degrees   CHL IP OTHER RECOMMENDATIONS 10/19/2017 Oral Care Recommendations Oral care BID   CHL IP FOLLOW UP RECOMMENDATIONS 10/19/2017 Follow up Recommendations None        CHL IP ORAL PHASE 10/19/2017 Oral Phase WFL  CHL IP PHARYNGEAL PHASE 10/19/2017 Pharyngeal Phase WFL  CHL IP CERVICAL ESOPHAGEAL PHASE 10/19/2017 Cervical Esophageal Phase WFL Celia B. Quentin [facility], CCC-SLP Speech Language Pathologist [phone] Shonna Chock 10/19/2017, 2:43 PM              US Thoracentesis Asp Pleural Space W/img Guide  Result Date: 11/01/2017 INDICATION: History of breast cancer, gastrointestinal stromal tumor, dyspnea, recurrent right  pleural effusion. Request made for diagnostic and therapeutic right thoracentesis. EXAM: ULTRASOUND GUIDED RIGHT THORACENTESIS MEDICATIONS: 1% Lidocaine = 10 mL COMPLICATIONS: None immediate. PROCEDURE: An ultrasound guided thoracentesis was thoroughly discussed with the patient and questions answered. The benefits, risks, alternatives and complications were also discussed. The patient understands and wishes to proceed with the procedure. Written consent was obtained. Ultrasound was performed to localize and mark an adequate pocket of fluid in the right chest. The area was then prepped and draped in the normal sterile fashion. 1% Lidocaine was used for local anesthesia. Under ultrasound guidance a 6 Fr Safe-T-Centesis catheter was introduced. Thoracentesis was performed. The catheter was removed and a dressing applied. FINDINGS: A total of approximately 1.5 Liters of bloody fluid was removed. IMPRESSION: Successful ultrasound guided right thoracentesis yielding 1.5 of pleural fluid. Post procedure chest X-ray shows no pneumothorax. Read by:  Gareth Eagle, PA-C Electronically Signed   By: Corrie Mckusick D.O.   On: 11/01/2017 10:32   US Thoracentesis Asp Pleural Space W/img Guide  Result Date: 10/26/2017 INDICATION: Patient with history of breast cancer, gastrointestinal stromal tumor, dyspnea, recurrent right pleural effusion. Request made for diagnostic and therapeutic right thoracentesis. EXAM: ULTRASOUND GUIDED DIAGNOSTIC AND THERAPEUTIC RIGHT THORACENTESIS MEDICATIONS: None. COMPLICATIONS: None immediate. PROCEDURE: An ultrasound guided thoracentesis was thoroughly discussed with the patient's daughter (patient with dementia) and questions answered. The benefits, risks, alternatives and complications were also discussed. The patient's daughter understands and wishes to proceed with the procedure. Written consent was obtained. Ultrasound was performed to localize and mark an adequate pocket of fluid in the  right chest. The area was then prepped and draped in the normal  sterile fashion. 1% Lidocaine was used for local anesthesia. Under ultrasound guidance a Safe-T-Centesis catheter was introduced. Thoracentesis was performed. The catheter was removed and a dressing applied. FINDINGS: A total of approximately 2 liters of bloody fluid was removed. Samples were sent to the laboratory as requested by the clinical team. IMPRESSION: Successful ultrasound guided diagnostic and therapeutic right thoracentesis yielding 2 liters of pleural fluid. Postprocedure chest x-ray revealed no pneumothorax. Read by: Rowe Robert, PA-C Electronically Signed   By: Jerilynn Mages.  Shick M.D.   On: 10/26/2017 12:02   US Thoracentesis Asp Pleural Space W/img Guide  Result Date: 10/14/2017 INDICATION: Patient with history of breast cancer, gastrointestinal stromal tumor, dyspnea, right pleural effusion. Request made for diagnostic and therapeutic right thoracentesis. EXAM: ULTRASOUND GUIDED DIAGNOSTIC AND THERAPEUTIC RIGHT THORACENTESIS MEDICATIONS: None. COMPLICATIONS: None immediate. PROCEDURE: An ultrasound guided thoracentesis was thoroughly discussed with the patient and questions answered. The benefits, risks, alternatives and complications were also discussed. The patient understands and wishes to proceed with the procedure. Written consent was obtained. Ultrasound was performed to localize and mark an adequate pocket of fluid in the right chest. The area was then prepped and draped in the normal sterile fashion. 1% Lidocaine was used for local anesthesia. Under ultrasound guidance a Safe-T-Centesis catheter was introduced. Thoracentesis was performed. The catheter was removed and a dressing applied. FINDINGS: A total of approximately 1.8 liters of dark, bloody fluid was removed. Samples were sent to the laboratory as requested by the clinical team. IMPRESSION: Successful ultrasound guided diagnostic and therapeutic right thoracentesis yielding  1.8 liters of pleural fluid. Read by: Rowe Robert, PA-C Electronically Signed   By: Jerilynn Mages.  Shick M.D.   On: 10/14/2017 15:15       Subjective: No new complaints.   Discharge Exam: Vitals:   11/07/17 0908 11/07/17 1340  BP:  (!) 95/50  Pulse:  (!) 51  Resp:  20  Temp:  98 F (36.7 C)  SpO2: 98% 100%   Vitals:   11/06/17 2252 11/07/17 0700 11/07/17 0908 11/07/17 1340  BP: (!) 113/40 (!) 114/47  (!) 95/50  Pulse: (!) 45 (!) 45  (!) 51  Resp: 19 20  20   Temp: 97.7 F (36.5 C) 97.8 F (36.6 C)  98 F (36.7 C)  TempSrc: Axillary Oral  Oral  SpO2: 99% 99% 98% 100%  Weight:  79.2 kg (174 lb 8 oz)    Height:        General: Pt is alert, awake, not in acute distress Cardiovascular: RRR, S1/S2 +, no rubs, no gallops Respiratory: CTA bilaterally, no wheezing, no rhonchi Abdominal: Soft, NT, ND, bowel sounds + Extremities: no edema, no cyanosis    The results of significant diagnostics from this hospitalization (including imaging, microbiology, ancillary and laboratory) are listed below for reference.     Microbiology: No results found for this or any previous visit (from the past 240 hour(s)).   Labs: BNP (last 3 results) Recent Labs    10/13/17 1736 10/25/17 0559  BNP 512.1* 283.1*   Basic Metabolic Panel: Recent Labs  Lab 11/03/17 0600 11/04/17 0541 11/05/17 0539 11/06/17 0558 11/07/17 0633  NA 142 144 142 142 139  K 3.9 3.8 3.8 4.3 4.0  CL 103 104 104 103 103  CO2 35* 34* 33* 33* 32  GLUCOSE 96 89 94 93 99  BUN 24* 25* 24* 25* 24*  CREATININE 0.85 0.79 0.78 0.99 0.99  CALCIUM 8.2* 8.4* 8.5* 8.4* 8.0*   Liver Function Tests: No results  for input(s): AST, ALT, ALKPHOS, BILITOT, PROT, ALBUMIN in the last 168 hours. No results for input(s): LIPASE, AMYLASE in the last 168 hours. No results for input(s): AMMONIA in the last 168 hours. CBC: Recent Labs  Lab 11/05/17 0949  WBC 3.7*  NEUTROABS 2.7  HGB 9.5*  HCT 30.2*  MCV 95.3  PLT 233   Cardiac  Enzymes: No results for input(s): CKTOTAL, CKMB, CKMBINDEX, TROPONINI in the last 168 hours. BNP: Invalid input(s): POCBNP CBG: No results for input(s): GLUCAP in the last 168 hours. D-Dimer No results for input(s): DDIMER in the last 72 hours. Hgb A1c No results for input(s): HGBA1C in the last 72 hours. Lipid Profile No results for input(s): CHOL, HDL, LDLCALC, TRIG, CHOLHDL, LDLDIRECT in the last 72 hours. Thyroid function studies No results for input(s): TSH, T4TOTAL, T3FREE, THYROIDAB in the last 72 hours.  Invalid input(s): FREET3 Anemia work up No results for input(s): VITAMINB12, FOLATE, FERRITIN, TIBC, IRON, RETICCTPCT in the last 72 hours. Urinalysis    Component Value Date/Time   COLORURINE YELLOW 10/20/2017 1730   APPEARANCEUR HAZY (A) 10/20/2017 1730   LABSPEC 1.019 10/20/2017 1730   PHURINE 5.0 10/20/2017 1730   GLUCOSEU NEGATIVE 10/20/2017 1730   HGBUR NEGATIVE 10/20/2017 1730   BILIRUBINUR NEGATIVE 10/20/2017 1730   BILIRUBINUR negative 04/05/2016 1414   KETONESUR NEGATIVE 10/20/2017 1730   PROTEINUR NEGATIVE 10/20/2017 1730   UROBILINOGEN 0.2 04/05/2016 1414   UROBILINOGEN 1.0 07/07/2015 1901   NITRITE NEGATIVE 10/20/2017 1730   LEUKOCYTESUR TRACE (A) 10/20/2017 1730   Sepsis Labs Invalid input(s): PROCALCITONIN,  WBC,  LACTICIDVEN Microbiology No results found for this or any previous visit (from the past 240 hour(s)).   Time coordinating discharge: Over 30 minutes  SIGNED:   Hosie Poisson, MD  [facility] 11/09/2017, 9:19 AM Pager   If 7PM-7AM, please contact night-coverage [email or web address] Password [email or web address]

## 2017-11-12 DIAGNOSIS — I25810 Atherosclerosis of coronary artery bypass graft(s) without angina pectoris: Secondary | ICD-10-CM | POA: Diagnosis not present

## 2017-11-12 DIAGNOSIS — C787 Secondary malignant neoplasm of liver and intrahepatic bile duct: Secondary | ICD-10-CM | POA: Diagnosis not present

## 2017-11-12 DIAGNOSIS — C49A0 Gastrointestinal stromal tumor, unspecified site: Secondary | ICD-10-CM | POA: Diagnosis not present

## 2017-11-12 DIAGNOSIS — C50919 Malignant neoplasm of unspecified site of unspecified female breast: Secondary | ICD-10-CM | POA: Diagnosis not present

## 2017-11-12 DIAGNOSIS — G309 Alzheimer's disease, unspecified: Secondary | ICD-10-CM | POA: Diagnosis not present

## 2017-11-12 DIAGNOSIS — J918 Pleural effusion in other conditions classified elsewhere: Secondary | ICD-10-CM | POA: Diagnosis not present

## 2017-11-13 DIAGNOSIS — C49A0 Gastrointestinal stromal tumor, unspecified site: Secondary | ICD-10-CM | POA: Diagnosis not present

## 2017-11-13 DIAGNOSIS — J918 Pleural effusion in other conditions classified elsewhere: Secondary | ICD-10-CM | POA: Diagnosis not present

## 2017-11-13 DIAGNOSIS — C50919 Malignant neoplasm of unspecified site of unspecified female breast: Secondary | ICD-10-CM | POA: Diagnosis not present

## 2017-11-13 DIAGNOSIS — G309 Alzheimer's disease, unspecified: Secondary | ICD-10-CM | POA: Diagnosis not present

## 2017-11-13 DIAGNOSIS — C787 Secondary malignant neoplasm of liver and intrahepatic bile duct: Secondary | ICD-10-CM | POA: Diagnosis not present

## 2017-11-13 DIAGNOSIS — I25810 Atherosclerosis of coronary artery bypass graft(s) without angina pectoris: Secondary | ICD-10-CM | POA: Diagnosis not present

## 2017-11-16 DIAGNOSIS — C787 Secondary malignant neoplasm of liver and intrahepatic bile duct: Secondary | ICD-10-CM | POA: Diagnosis not present

## 2017-11-16 DIAGNOSIS — C49A0 Gastrointestinal stromal tumor, unspecified site: Secondary | ICD-10-CM | POA: Diagnosis not present

## 2017-11-16 DIAGNOSIS — G309 Alzheimer's disease, unspecified: Secondary | ICD-10-CM | POA: Diagnosis not present

## 2017-11-16 DIAGNOSIS — J918 Pleural effusion in other conditions classified elsewhere: Secondary | ICD-10-CM | POA: Diagnosis not present

## 2017-11-16 DIAGNOSIS — C50919 Malignant neoplasm of unspecified site of unspecified female breast: Secondary | ICD-10-CM | POA: Diagnosis not present

## 2017-11-16 DIAGNOSIS — I25810 Atherosclerosis of coronary artery bypass graft(s) without angina pectoris: Secondary | ICD-10-CM | POA: Diagnosis not present

## 2017-11-19 DIAGNOSIS — G309 Alzheimer's disease, unspecified: Secondary | ICD-10-CM | POA: Diagnosis not present

## 2017-11-19 DIAGNOSIS — C787 Secondary malignant neoplasm of liver and intrahepatic bile duct: Secondary | ICD-10-CM | POA: Diagnosis not present

## 2017-11-19 DIAGNOSIS — I25810 Atherosclerosis of coronary artery bypass graft(s) without angina pectoris: Secondary | ICD-10-CM | POA: Diagnosis not present

## 2017-11-19 DIAGNOSIS — J918 Pleural effusion in other conditions classified elsewhere: Secondary | ICD-10-CM | POA: Diagnosis not present

## 2017-11-19 DIAGNOSIS — C50919 Malignant neoplasm of unspecified site of unspecified female breast: Secondary | ICD-10-CM | POA: Diagnosis not present

## 2017-11-19 DIAGNOSIS — C49A0 Gastrointestinal stromal tumor, unspecified site: Secondary | ICD-10-CM | POA: Diagnosis not present

## 2017-11-23 DIAGNOSIS — J918 Pleural effusion in other conditions classified elsewhere: Secondary | ICD-10-CM | POA: Diagnosis not present

## 2017-11-23 DIAGNOSIS — C787 Secondary malignant neoplasm of liver and intrahepatic bile duct: Secondary | ICD-10-CM | POA: Diagnosis not present

## 2017-11-23 DIAGNOSIS — G309 Alzheimer's disease, unspecified: Secondary | ICD-10-CM | POA: Diagnosis not present

## 2017-11-23 DIAGNOSIS — I25810 Atherosclerosis of coronary artery bypass graft(s) without angina pectoris: Secondary | ICD-10-CM | POA: Diagnosis not present

## 2017-11-23 DIAGNOSIS — C49A0 Gastrointestinal stromal tumor, unspecified site: Secondary | ICD-10-CM | POA: Diagnosis not present

## 2017-11-23 DIAGNOSIS — C50919 Malignant neoplasm of unspecified site of unspecified female breast: Secondary | ICD-10-CM | POA: Diagnosis not present

## 2017-11-24 DIAGNOSIS — C787 Secondary malignant neoplasm of liver and intrahepatic bile duct: Secondary | ICD-10-CM | POA: Diagnosis not present

## 2017-11-24 DIAGNOSIS — C50919 Malignant neoplasm of unspecified site of unspecified female breast: Secondary | ICD-10-CM | POA: Diagnosis not present

## 2017-11-24 DIAGNOSIS — J918 Pleural effusion in other conditions classified elsewhere: Secondary | ICD-10-CM | POA: Diagnosis not present

## 2017-11-24 DIAGNOSIS — I25810 Atherosclerosis of coronary artery bypass graft(s) without angina pectoris: Secondary | ICD-10-CM | POA: Diagnosis not present

## 2017-11-24 DIAGNOSIS — G309 Alzheimer's disease, unspecified: Secondary | ICD-10-CM | POA: Diagnosis not present

## 2017-11-24 DIAGNOSIS — C49A0 Gastrointestinal stromal tumor, unspecified site: Secondary | ICD-10-CM | POA: Diagnosis not present

## 2017-11-26 DIAGNOSIS — C50919 Malignant neoplasm of unspecified site of unspecified female breast: Secondary | ICD-10-CM | POA: Diagnosis not present

## 2017-11-26 DIAGNOSIS — J918 Pleural effusion in other conditions classified elsewhere: Secondary | ICD-10-CM | POA: Diagnosis not present

## 2017-11-26 DIAGNOSIS — C787 Secondary malignant neoplasm of liver and intrahepatic bile duct: Secondary | ICD-10-CM | POA: Diagnosis not present

## 2017-11-26 DIAGNOSIS — I25810 Atherosclerosis of coronary artery bypass graft(s) without angina pectoris: Secondary | ICD-10-CM | POA: Diagnosis not present

## 2017-11-26 DIAGNOSIS — C49A0 Gastrointestinal stromal tumor, unspecified site: Secondary | ICD-10-CM | POA: Diagnosis not present

## 2017-11-26 DIAGNOSIS — G309 Alzheimer's disease, unspecified: Secondary | ICD-10-CM | POA: Diagnosis not present

## 2017-11-30 DIAGNOSIS — I25810 Atherosclerosis of coronary artery bypass graft(s) without angina pectoris: Secondary | ICD-10-CM | POA: Diagnosis not present

## 2017-11-30 DIAGNOSIS — C49A0 Gastrointestinal stromal tumor, unspecified site: Secondary | ICD-10-CM | POA: Diagnosis not present

## 2017-11-30 DIAGNOSIS — C50919 Malignant neoplasm of unspecified site of unspecified female breast: Secondary | ICD-10-CM | POA: Diagnosis not present

## 2017-11-30 DIAGNOSIS — G309 Alzheimer's disease, unspecified: Secondary | ICD-10-CM | POA: Diagnosis not present

## 2017-11-30 DIAGNOSIS — J918 Pleural effusion in other conditions classified elsewhere: Secondary | ICD-10-CM | POA: Diagnosis not present

## 2017-11-30 DIAGNOSIS — C787 Secondary malignant neoplasm of liver and intrahepatic bile duct: Secondary | ICD-10-CM | POA: Diagnosis not present

## 2017-12-03 DIAGNOSIS — J918 Pleural effusion in other conditions classified elsewhere: Secondary | ICD-10-CM | POA: Diagnosis not present

## 2017-12-03 DIAGNOSIS — I25810 Atherosclerosis of coronary artery bypass graft(s) without angina pectoris: Secondary | ICD-10-CM | POA: Diagnosis not present

## 2017-12-03 DIAGNOSIS — C787 Secondary malignant neoplasm of liver and intrahepatic bile duct: Secondary | ICD-10-CM | POA: Diagnosis not present

## 2017-12-03 DIAGNOSIS — G309 Alzheimer's disease, unspecified: Secondary | ICD-10-CM | POA: Diagnosis not present

## 2017-12-03 DIAGNOSIS — C50919 Malignant neoplasm of unspecified site of unspecified female breast: Secondary | ICD-10-CM | POA: Diagnosis not present

## 2017-12-03 DIAGNOSIS — C49A0 Gastrointestinal stromal tumor, unspecified site: Secondary | ICD-10-CM | POA: Diagnosis not present

## 2017-12-04 DIAGNOSIS — N183 Chronic kidney disease, stage 3 (moderate): Secondary | ICD-10-CM | POA: Diagnosis not present

## 2017-12-04 DIAGNOSIS — C50919 Malignant neoplasm of unspecified site of unspecified female breast: Secondary | ICD-10-CM | POA: Diagnosis not present

## 2017-12-04 DIAGNOSIS — C49A0 Gastrointestinal stromal tumor, unspecified site: Secondary | ICD-10-CM | POA: Diagnosis not present

## 2017-12-04 DIAGNOSIS — G309 Alzheimer's disease, unspecified: Secondary | ICD-10-CM | POA: Diagnosis not present

## 2017-12-04 DIAGNOSIS — C787 Secondary malignant neoplasm of liver and intrahepatic bile duct: Secondary | ICD-10-CM | POA: Diagnosis not present

## 2017-12-04 DIAGNOSIS — E785 Hyperlipidemia, unspecified: Secondary | ICD-10-CM | POA: Diagnosis not present

## 2017-12-04 DIAGNOSIS — I25810 Atherosclerosis of coronary artery bypass graft(s) without angina pectoris: Secondary | ICD-10-CM | POA: Diagnosis not present

## 2017-12-04 DIAGNOSIS — I4891 Unspecified atrial fibrillation: Secondary | ICD-10-CM | POA: Diagnosis not present

## 2017-12-04 DIAGNOSIS — J918 Pleural effusion in other conditions classified elsewhere: Secondary | ICD-10-CM | POA: Diagnosis not present

## 2017-12-04 DIAGNOSIS — I10 Essential (primary) hypertension: Secondary | ICD-10-CM | POA: Diagnosis not present

## 2017-12-04 DIAGNOSIS — I509 Heart failure, unspecified: Secondary | ICD-10-CM | POA: Diagnosis not present

## 2017-12-07 DIAGNOSIS — J918 Pleural effusion in other conditions classified elsewhere: Secondary | ICD-10-CM | POA: Diagnosis not present

## 2017-12-07 DIAGNOSIS — C49A0 Gastrointestinal stromal tumor, unspecified site: Secondary | ICD-10-CM | POA: Diagnosis not present

## 2017-12-07 DIAGNOSIS — G309 Alzheimer's disease, unspecified: Secondary | ICD-10-CM | POA: Diagnosis not present

## 2017-12-07 DIAGNOSIS — C50919 Malignant neoplasm of unspecified site of unspecified female breast: Secondary | ICD-10-CM | POA: Diagnosis not present

## 2017-12-07 DIAGNOSIS — I25810 Atherosclerosis of coronary artery bypass graft(s) without angina pectoris: Secondary | ICD-10-CM | POA: Diagnosis not present

## 2017-12-07 DIAGNOSIS — C787 Secondary malignant neoplasm of liver and intrahepatic bile duct: Secondary | ICD-10-CM | POA: Diagnosis not present

## 2017-12-09 LAB — ACID FAST CULTURE WITH REFLEXED SENSITIVITIES (MYCOBACTERIA): Acid Fast Culture: NEGATIVE

## 2017-12-10 DIAGNOSIS — I25810 Atherosclerosis of coronary artery bypass graft(s) without angina pectoris: Secondary | ICD-10-CM | POA: Diagnosis not present

## 2017-12-10 DIAGNOSIS — G309 Alzheimer's disease, unspecified: Secondary | ICD-10-CM | POA: Diagnosis not present

## 2017-12-10 DIAGNOSIS — C49A0 Gastrointestinal stromal tumor, unspecified site: Secondary | ICD-10-CM | POA: Diagnosis not present

## 2017-12-10 DIAGNOSIS — C787 Secondary malignant neoplasm of liver and intrahepatic bile duct: Secondary | ICD-10-CM | POA: Diagnosis not present

## 2017-12-10 DIAGNOSIS — J918 Pleural effusion in other conditions classified elsewhere: Secondary | ICD-10-CM | POA: Diagnosis not present

## 2017-12-10 DIAGNOSIS — C50919 Malignant neoplasm of unspecified site of unspecified female breast: Secondary | ICD-10-CM | POA: Diagnosis not present

## 2017-12-14 DIAGNOSIS — G309 Alzheimer's disease, unspecified: Secondary | ICD-10-CM | POA: Diagnosis not present

## 2017-12-14 DIAGNOSIS — C49A0 Gastrointestinal stromal tumor, unspecified site: Secondary | ICD-10-CM | POA: Diagnosis not present

## 2017-12-14 DIAGNOSIS — J918 Pleural effusion in other conditions classified elsewhere: Secondary | ICD-10-CM | POA: Diagnosis not present

## 2017-12-14 DIAGNOSIS — C50919 Malignant neoplasm of unspecified site of unspecified female breast: Secondary | ICD-10-CM | POA: Diagnosis not present

## 2017-12-14 DIAGNOSIS — C787 Secondary malignant neoplasm of liver and intrahepatic bile duct: Secondary | ICD-10-CM | POA: Diagnosis not present

## 2017-12-14 DIAGNOSIS — I25810 Atherosclerosis of coronary artery bypass graft(s) without angina pectoris: Secondary | ICD-10-CM | POA: Diagnosis not present

## 2017-12-17 DIAGNOSIS — J918 Pleural effusion in other conditions classified elsewhere: Secondary | ICD-10-CM | POA: Diagnosis not present

## 2017-12-17 DIAGNOSIS — G309 Alzheimer's disease, unspecified: Secondary | ICD-10-CM | POA: Diagnosis not present

## 2017-12-17 DIAGNOSIS — I25810 Atherosclerosis of coronary artery bypass graft(s) without angina pectoris: Secondary | ICD-10-CM | POA: Diagnosis not present

## 2017-12-17 DIAGNOSIS — C787 Secondary malignant neoplasm of liver and intrahepatic bile duct: Secondary | ICD-10-CM | POA: Diagnosis not present

## 2017-12-17 DIAGNOSIS — C50919 Malignant neoplasm of unspecified site of unspecified female breast: Secondary | ICD-10-CM | POA: Diagnosis not present

## 2017-12-17 DIAGNOSIS — C49A0 Gastrointestinal stromal tumor, unspecified site: Secondary | ICD-10-CM | POA: Diagnosis not present

## 2017-12-21 DIAGNOSIS — J918 Pleural effusion in other conditions classified elsewhere: Secondary | ICD-10-CM | POA: Diagnosis not present

## 2017-12-21 DIAGNOSIS — C787 Secondary malignant neoplasm of liver and intrahepatic bile duct: Secondary | ICD-10-CM | POA: Diagnosis not present

## 2017-12-21 DIAGNOSIS — C49A0 Gastrointestinal stromal tumor, unspecified site: Secondary | ICD-10-CM | POA: Diagnosis not present

## 2017-12-21 DIAGNOSIS — G309 Alzheimer's disease, unspecified: Secondary | ICD-10-CM | POA: Diagnosis not present

## 2017-12-21 DIAGNOSIS — I25810 Atherosclerosis of coronary artery bypass graft(s) without angina pectoris: Secondary | ICD-10-CM | POA: Diagnosis not present

## 2017-12-21 DIAGNOSIS — C50919 Malignant neoplasm of unspecified site of unspecified female breast: Secondary | ICD-10-CM | POA: Diagnosis not present

## 2017-12-22 DIAGNOSIS — G309 Alzheimer's disease, unspecified: Secondary | ICD-10-CM | POA: Diagnosis not present

## 2017-12-22 DIAGNOSIS — C50919 Malignant neoplasm of unspecified site of unspecified female breast: Secondary | ICD-10-CM | POA: Diagnosis not present

## 2017-12-22 DIAGNOSIS — I25810 Atherosclerosis of coronary artery bypass graft(s) without angina pectoris: Secondary | ICD-10-CM | POA: Diagnosis not present

## 2017-12-22 DIAGNOSIS — C49A0 Gastrointestinal stromal tumor, unspecified site: Secondary | ICD-10-CM | POA: Diagnosis not present

## 2017-12-22 DIAGNOSIS — C787 Secondary malignant neoplasm of liver and intrahepatic bile duct: Secondary | ICD-10-CM | POA: Diagnosis not present

## 2017-12-22 DIAGNOSIS — J918 Pleural effusion in other conditions classified elsewhere: Secondary | ICD-10-CM | POA: Diagnosis not present

## 2017-12-22 LAB — ACID FAST CULTURE WITH REFLEXED SENSITIVITIES (MYCOBACTERIA): Acid Fast Culture: NEGATIVE

## 2017-12-22 LAB — ACID FAST SMEAR (AFB, MYCOBACTERIA): Acid Fast Smear: NEGATIVE

## 2017-12-24 DIAGNOSIS — C787 Secondary malignant neoplasm of liver and intrahepatic bile duct: Secondary | ICD-10-CM | POA: Diagnosis not present

## 2017-12-24 DIAGNOSIS — C49A0 Gastrointestinal stromal tumor, unspecified site: Secondary | ICD-10-CM | POA: Diagnosis not present

## 2017-12-24 DIAGNOSIS — C50919 Malignant neoplasm of unspecified site of unspecified female breast: Secondary | ICD-10-CM | POA: Diagnosis not present

## 2017-12-24 DIAGNOSIS — I25810 Atherosclerosis of coronary artery bypass graft(s) without angina pectoris: Secondary | ICD-10-CM | POA: Diagnosis not present

## 2017-12-24 DIAGNOSIS — G309 Alzheimer's disease, unspecified: Secondary | ICD-10-CM | POA: Diagnosis not present

## 2017-12-24 DIAGNOSIS — J918 Pleural effusion in other conditions classified elsewhere: Secondary | ICD-10-CM | POA: Diagnosis not present

## 2017-12-28 DIAGNOSIS — I25810 Atherosclerosis of coronary artery bypass graft(s) without angina pectoris: Secondary | ICD-10-CM | POA: Diagnosis not present

## 2017-12-28 DIAGNOSIS — C787 Secondary malignant neoplasm of liver and intrahepatic bile duct: Secondary | ICD-10-CM | POA: Diagnosis not present

## 2017-12-28 DIAGNOSIS — C49A0 Gastrointestinal stromal tumor, unspecified site: Secondary | ICD-10-CM | POA: Diagnosis not present

## 2017-12-28 DIAGNOSIS — J918 Pleural effusion in other conditions classified elsewhere: Secondary | ICD-10-CM | POA: Diagnosis not present

## 2017-12-28 DIAGNOSIS — C50919 Malignant neoplasm of unspecified site of unspecified female breast: Secondary | ICD-10-CM | POA: Diagnosis not present

## 2017-12-28 DIAGNOSIS — G309 Alzheimer's disease, unspecified: Secondary | ICD-10-CM | POA: Diagnosis not present

## 2017-12-29 DIAGNOSIS — C50919 Malignant neoplasm of unspecified site of unspecified female breast: Secondary | ICD-10-CM | POA: Diagnosis not present

## 2017-12-29 DIAGNOSIS — J918 Pleural effusion in other conditions classified elsewhere: Secondary | ICD-10-CM | POA: Diagnosis not present

## 2017-12-29 DIAGNOSIS — I25810 Atherosclerosis of coronary artery bypass graft(s) without angina pectoris: Secondary | ICD-10-CM | POA: Diagnosis not present

## 2017-12-29 DIAGNOSIS — G309 Alzheimer's disease, unspecified: Secondary | ICD-10-CM | POA: Diagnosis not present

## 2017-12-29 DIAGNOSIS — C49A0 Gastrointestinal stromal tumor, unspecified site: Secondary | ICD-10-CM | POA: Diagnosis not present

## 2017-12-29 DIAGNOSIS — C787 Secondary malignant neoplasm of liver and intrahepatic bile duct: Secondary | ICD-10-CM | POA: Diagnosis not present

## 2018-01-01 DIAGNOSIS — C49A0 Gastrointestinal stromal tumor, unspecified site: Secondary | ICD-10-CM | POA: Diagnosis not present

## 2018-01-01 DIAGNOSIS — C787 Secondary malignant neoplasm of liver and intrahepatic bile duct: Secondary | ICD-10-CM | POA: Diagnosis not present

## 2018-01-01 DIAGNOSIS — I10 Essential (primary) hypertension: Secondary | ICD-10-CM | POA: Diagnosis not present

## 2018-01-01 DIAGNOSIS — I509 Heart failure, unspecified: Secondary | ICD-10-CM | POA: Diagnosis not present

## 2018-01-01 DIAGNOSIS — J918 Pleural effusion in other conditions classified elsewhere: Secondary | ICD-10-CM | POA: Diagnosis not present

## 2018-01-01 DIAGNOSIS — N183 Chronic kidney disease, stage 3 (moderate): Secondary | ICD-10-CM | POA: Diagnosis not present

## 2018-01-01 DIAGNOSIS — C50919 Malignant neoplasm of unspecified site of unspecified female breast: Secondary | ICD-10-CM | POA: Diagnosis not present

## 2018-01-01 DIAGNOSIS — I4891 Unspecified atrial fibrillation: Secondary | ICD-10-CM | POA: Diagnosis not present

## 2018-01-01 DIAGNOSIS — I25810 Atherosclerosis of coronary artery bypass graft(s) without angina pectoris: Secondary | ICD-10-CM | POA: Diagnosis not present

## 2018-01-01 DIAGNOSIS — G309 Alzheimer's disease, unspecified: Secondary | ICD-10-CM | POA: Diagnosis not present

## 2018-01-01 DIAGNOSIS — E785 Hyperlipidemia, unspecified: Secondary | ICD-10-CM | POA: Diagnosis not present

## 2018-01-04 DIAGNOSIS — C49A0 Gastrointestinal stromal tumor, unspecified site: Secondary | ICD-10-CM | POA: Diagnosis not present

## 2018-01-04 DIAGNOSIS — C787 Secondary malignant neoplasm of liver and intrahepatic bile duct: Secondary | ICD-10-CM | POA: Diagnosis not present

## 2018-01-04 DIAGNOSIS — G309 Alzheimer's disease, unspecified: Secondary | ICD-10-CM | POA: Diagnosis not present

## 2018-01-04 DIAGNOSIS — C50919 Malignant neoplasm of unspecified site of unspecified female breast: Secondary | ICD-10-CM | POA: Diagnosis not present

## 2018-01-04 DIAGNOSIS — I25810 Atherosclerosis of coronary artery bypass graft(s) without angina pectoris: Secondary | ICD-10-CM | POA: Diagnosis not present

## 2018-01-04 DIAGNOSIS — J918 Pleural effusion in other conditions classified elsewhere: Secondary | ICD-10-CM | POA: Diagnosis not present

## 2018-01-06 LAB — PH, BODY FLUID: PH, BODY FLUID: 7.4

## 2018-01-11 DIAGNOSIS — C50919 Malignant neoplasm of unspecified site of unspecified female breast: Secondary | ICD-10-CM | POA: Diagnosis not present

## 2018-01-11 DIAGNOSIS — I25810 Atherosclerosis of coronary artery bypass graft(s) without angina pectoris: Secondary | ICD-10-CM | POA: Diagnosis not present

## 2018-01-11 DIAGNOSIS — C787 Secondary malignant neoplasm of liver and intrahepatic bile duct: Secondary | ICD-10-CM | POA: Diagnosis not present

## 2018-01-11 DIAGNOSIS — G309 Alzheimer's disease, unspecified: Secondary | ICD-10-CM | POA: Diagnosis not present

## 2018-01-11 DIAGNOSIS — J918 Pleural effusion in other conditions classified elsewhere: Secondary | ICD-10-CM | POA: Diagnosis not present

## 2018-01-11 DIAGNOSIS — C49A0 Gastrointestinal stromal tumor, unspecified site: Secondary | ICD-10-CM | POA: Diagnosis not present

## 2018-01-18 DIAGNOSIS — I25810 Atherosclerosis of coronary artery bypass graft(s) without angina pectoris: Secondary | ICD-10-CM | POA: Diagnosis not present

## 2018-01-18 DIAGNOSIS — C49A0 Gastrointestinal stromal tumor, unspecified site: Secondary | ICD-10-CM | POA: Diagnosis not present

## 2018-01-18 DIAGNOSIS — G309 Alzheimer's disease, unspecified: Secondary | ICD-10-CM | POA: Diagnosis not present

## 2018-01-18 DIAGNOSIS — J918 Pleural effusion in other conditions classified elsewhere: Secondary | ICD-10-CM | POA: Diagnosis not present

## 2018-01-18 DIAGNOSIS — C50919 Malignant neoplasm of unspecified site of unspecified female breast: Secondary | ICD-10-CM | POA: Diagnosis not present

## 2018-01-18 DIAGNOSIS — C787 Secondary malignant neoplasm of liver and intrahepatic bile duct: Secondary | ICD-10-CM | POA: Diagnosis not present

## 2018-01-25 DIAGNOSIS — C50919 Malignant neoplasm of unspecified site of unspecified female breast: Secondary | ICD-10-CM | POA: Diagnosis not present

## 2018-01-25 DIAGNOSIS — J918 Pleural effusion in other conditions classified elsewhere: Secondary | ICD-10-CM | POA: Diagnosis not present

## 2018-01-25 DIAGNOSIS — G309 Alzheimer's disease, unspecified: Secondary | ICD-10-CM | POA: Diagnosis not present

## 2018-01-25 DIAGNOSIS — C49A0 Gastrointestinal stromal tumor, unspecified site: Secondary | ICD-10-CM | POA: Diagnosis not present

## 2018-01-25 DIAGNOSIS — I25810 Atherosclerosis of coronary artery bypass graft(s) without angina pectoris: Secondary | ICD-10-CM | POA: Diagnosis not present

## 2018-01-25 DIAGNOSIS — C787 Secondary malignant neoplasm of liver and intrahepatic bile duct: Secondary | ICD-10-CM | POA: Diagnosis not present

## 2018-01-27 DIAGNOSIS — C50919 Malignant neoplasm of unspecified site of unspecified female breast: Secondary | ICD-10-CM | POA: Diagnosis not present

## 2018-01-27 DIAGNOSIS — C787 Secondary malignant neoplasm of liver and intrahepatic bile duct: Secondary | ICD-10-CM | POA: Diagnosis not present

## 2018-01-27 DIAGNOSIS — I25810 Atherosclerosis of coronary artery bypass graft(s) without angina pectoris: Secondary | ICD-10-CM | POA: Diagnosis not present

## 2018-01-27 DIAGNOSIS — G309 Alzheimer's disease, unspecified: Secondary | ICD-10-CM | POA: Diagnosis not present

## 2018-01-27 DIAGNOSIS — J918 Pleural effusion in other conditions classified elsewhere: Secondary | ICD-10-CM | POA: Diagnosis not present

## 2018-01-27 DIAGNOSIS — C49A0 Gastrointestinal stromal tumor, unspecified site: Secondary | ICD-10-CM | POA: Diagnosis not present

## 2018-02-01 DIAGNOSIS — C50919 Malignant neoplasm of unspecified site of unspecified female breast: Secondary | ICD-10-CM | POA: Diagnosis not present

## 2018-02-01 DIAGNOSIS — I4891 Unspecified atrial fibrillation: Secondary | ICD-10-CM | POA: Diagnosis not present

## 2018-02-01 DIAGNOSIS — I509 Heart failure, unspecified: Secondary | ICD-10-CM | POA: Diagnosis not present

## 2018-02-01 DIAGNOSIS — N183 Chronic kidney disease, stage 3 (moderate): Secondary | ICD-10-CM | POA: Diagnosis not present

## 2018-02-01 DIAGNOSIS — C49A0 Gastrointestinal stromal tumor, unspecified site: Secondary | ICD-10-CM | POA: Diagnosis not present

## 2018-02-01 DIAGNOSIS — N3281 Overactive bladder: Secondary | ICD-10-CM | POA: Diagnosis not present

## 2018-02-01 DIAGNOSIS — I25810 Atherosclerosis of coronary artery bypass graft(s) without angina pectoris: Secondary | ICD-10-CM | POA: Diagnosis not present

## 2018-02-01 DIAGNOSIS — C787 Secondary malignant neoplasm of liver and intrahepatic bile duct: Secondary | ICD-10-CM | POA: Diagnosis not present

## 2018-02-01 DIAGNOSIS — E785 Hyperlipidemia, unspecified: Secondary | ICD-10-CM | POA: Diagnosis not present

## 2018-02-01 DIAGNOSIS — I10 Essential (primary) hypertension: Secondary | ICD-10-CM | POA: Diagnosis not present

## 2018-02-01 DIAGNOSIS — G309 Alzheimer's disease, unspecified: Secondary | ICD-10-CM | POA: Diagnosis not present

## 2018-02-01 DIAGNOSIS — J918 Pleural effusion in other conditions classified elsewhere: Secondary | ICD-10-CM | POA: Diagnosis not present

## 2018-02-02 DIAGNOSIS — J918 Pleural effusion in other conditions classified elsewhere: Secondary | ICD-10-CM | POA: Diagnosis not present

## 2018-02-02 DIAGNOSIS — C50919 Malignant neoplasm of unspecified site of unspecified female breast: Secondary | ICD-10-CM | POA: Diagnosis not present

## 2018-02-02 DIAGNOSIS — I509 Heart failure, unspecified: Secondary | ICD-10-CM | POA: Diagnosis not present

## 2018-02-02 DIAGNOSIS — I25810 Atherosclerosis of coronary artery bypass graft(s) without angina pectoris: Secondary | ICD-10-CM | POA: Diagnosis not present

## 2018-02-02 DIAGNOSIS — C787 Secondary malignant neoplasm of liver and intrahepatic bile duct: Secondary | ICD-10-CM | POA: Diagnosis not present

## 2018-02-02 DIAGNOSIS — C49A0 Gastrointestinal stromal tumor, unspecified site: Secondary | ICD-10-CM | POA: Diagnosis not present

## 2018-02-04 DIAGNOSIS — I25810 Atherosclerosis of coronary artery bypass graft(s) without angina pectoris: Secondary | ICD-10-CM | POA: Diagnosis not present

## 2018-02-04 DIAGNOSIS — J918 Pleural effusion in other conditions classified elsewhere: Secondary | ICD-10-CM | POA: Diagnosis not present

## 2018-02-04 DIAGNOSIS — C787 Secondary malignant neoplasm of liver and intrahepatic bile duct: Secondary | ICD-10-CM | POA: Diagnosis not present

## 2018-02-04 DIAGNOSIS — C49A0 Gastrointestinal stromal tumor, unspecified site: Secondary | ICD-10-CM | POA: Diagnosis not present

## 2018-02-04 DIAGNOSIS — I509 Heart failure, unspecified: Secondary | ICD-10-CM | POA: Diagnosis not present

## 2018-02-04 DIAGNOSIS — C50919 Malignant neoplasm of unspecified site of unspecified female breast: Secondary | ICD-10-CM | POA: Diagnosis not present

## 2018-02-09 DIAGNOSIS — I509 Heart failure, unspecified: Secondary | ICD-10-CM | POA: Diagnosis not present

## 2018-02-09 DIAGNOSIS — C49A0 Gastrointestinal stromal tumor, unspecified site: Secondary | ICD-10-CM | POA: Diagnosis not present

## 2018-02-09 DIAGNOSIS — C787 Secondary malignant neoplasm of liver and intrahepatic bile duct: Secondary | ICD-10-CM | POA: Diagnosis not present

## 2018-02-09 DIAGNOSIS — I25810 Atherosclerosis of coronary artery bypass graft(s) without angina pectoris: Secondary | ICD-10-CM | POA: Diagnosis not present

## 2018-02-09 DIAGNOSIS — C50919 Malignant neoplasm of unspecified site of unspecified female breast: Secondary | ICD-10-CM | POA: Diagnosis not present

## 2018-02-09 DIAGNOSIS — J918 Pleural effusion in other conditions classified elsewhere: Secondary | ICD-10-CM | POA: Diagnosis not present

## 2018-02-17 DIAGNOSIS — C50919 Malignant neoplasm of unspecified site of unspecified female breast: Secondary | ICD-10-CM | POA: Diagnosis not present

## 2018-02-17 DIAGNOSIS — I509 Heart failure, unspecified: Secondary | ICD-10-CM | POA: Diagnosis not present

## 2018-02-17 DIAGNOSIS — J918 Pleural effusion in other conditions classified elsewhere: Secondary | ICD-10-CM | POA: Diagnosis not present

## 2018-02-17 DIAGNOSIS — I25810 Atherosclerosis of coronary artery bypass graft(s) without angina pectoris: Secondary | ICD-10-CM | POA: Diagnosis not present

## 2018-02-17 DIAGNOSIS — C49A0 Gastrointestinal stromal tumor, unspecified site: Secondary | ICD-10-CM | POA: Diagnosis not present

## 2018-02-17 DIAGNOSIS — C787 Secondary malignant neoplasm of liver and intrahepatic bile duct: Secondary | ICD-10-CM | POA: Diagnosis not present

## 2018-02-24 DIAGNOSIS — C787 Secondary malignant neoplasm of liver and intrahepatic bile duct: Secondary | ICD-10-CM | POA: Diagnosis not present

## 2018-02-24 DIAGNOSIS — J918 Pleural effusion in other conditions classified elsewhere: Secondary | ICD-10-CM | POA: Diagnosis not present

## 2018-02-24 DIAGNOSIS — I25810 Atherosclerosis of coronary artery bypass graft(s) without angina pectoris: Secondary | ICD-10-CM | POA: Diagnosis not present

## 2018-02-24 DIAGNOSIS — C50919 Malignant neoplasm of unspecified site of unspecified female breast: Secondary | ICD-10-CM | POA: Diagnosis not present

## 2018-02-24 DIAGNOSIS — C49A0 Gastrointestinal stromal tumor, unspecified site: Secondary | ICD-10-CM | POA: Diagnosis not present

## 2018-02-24 DIAGNOSIS — I509 Heart failure, unspecified: Secondary | ICD-10-CM | POA: Diagnosis not present

## 2018-03-03 DIAGNOSIS — I25810 Atherosclerosis of coronary artery bypass graft(s) without angina pectoris: Secondary | ICD-10-CM | POA: Diagnosis not present

## 2018-03-03 DIAGNOSIS — C49A0 Gastrointestinal stromal tumor, unspecified site: Secondary | ICD-10-CM | POA: Diagnosis not present

## 2018-03-03 DIAGNOSIS — I4891 Unspecified atrial fibrillation: Secondary | ICD-10-CM | POA: Diagnosis not present

## 2018-03-03 DIAGNOSIS — E785 Hyperlipidemia, unspecified: Secondary | ICD-10-CM | POA: Diagnosis not present

## 2018-03-03 DIAGNOSIS — G309 Alzheimer's disease, unspecified: Secondary | ICD-10-CM | POA: Diagnosis not present

## 2018-03-03 DIAGNOSIS — N183 Chronic kidney disease, stage 3 (moderate): Secondary | ICD-10-CM | POA: Diagnosis not present

## 2018-03-03 DIAGNOSIS — J918 Pleural effusion in other conditions classified elsewhere: Secondary | ICD-10-CM | POA: Diagnosis not present

## 2018-03-03 DIAGNOSIS — N3281 Overactive bladder: Secondary | ICD-10-CM | POA: Diagnosis not present

## 2018-03-03 DIAGNOSIS — I509 Heart failure, unspecified: Secondary | ICD-10-CM | POA: Diagnosis not present

## 2018-03-03 DIAGNOSIS — C50919 Malignant neoplasm of unspecified site of unspecified female breast: Secondary | ICD-10-CM | POA: Diagnosis not present

## 2018-03-03 DIAGNOSIS — C787 Secondary malignant neoplasm of liver and intrahepatic bile duct: Secondary | ICD-10-CM | POA: Diagnosis not present

## 2018-03-03 DIAGNOSIS — I10 Essential (primary) hypertension: Secondary | ICD-10-CM | POA: Diagnosis not present

## 2018-03-05 DIAGNOSIS — C787 Secondary malignant neoplasm of liver and intrahepatic bile duct: Secondary | ICD-10-CM | POA: Diagnosis not present

## 2018-03-05 DIAGNOSIS — J918 Pleural effusion in other conditions classified elsewhere: Secondary | ICD-10-CM | POA: Diagnosis not present

## 2018-03-05 DIAGNOSIS — C50919 Malignant neoplasm of unspecified site of unspecified female breast: Secondary | ICD-10-CM | POA: Diagnosis not present

## 2018-03-05 DIAGNOSIS — I25810 Atherosclerosis of coronary artery bypass graft(s) without angina pectoris: Secondary | ICD-10-CM | POA: Diagnosis not present

## 2018-03-05 DIAGNOSIS — C49A0 Gastrointestinal stromal tumor, unspecified site: Secondary | ICD-10-CM | POA: Diagnosis not present

## 2018-03-05 DIAGNOSIS — I509 Heart failure, unspecified: Secondary | ICD-10-CM | POA: Diagnosis not present

## 2018-03-09 DIAGNOSIS — C50919 Malignant neoplasm of unspecified site of unspecified female breast: Secondary | ICD-10-CM | POA: Diagnosis not present

## 2018-03-09 DIAGNOSIS — J918 Pleural effusion in other conditions classified elsewhere: Secondary | ICD-10-CM | POA: Diagnosis not present

## 2018-03-09 DIAGNOSIS — C49A0 Gastrointestinal stromal tumor, unspecified site: Secondary | ICD-10-CM | POA: Diagnosis not present

## 2018-03-09 DIAGNOSIS — C787 Secondary malignant neoplasm of liver and intrahepatic bile duct: Secondary | ICD-10-CM | POA: Diagnosis not present

## 2018-03-09 DIAGNOSIS — I25810 Atherosclerosis of coronary artery bypass graft(s) without angina pectoris: Secondary | ICD-10-CM | POA: Diagnosis not present

## 2018-03-09 DIAGNOSIS — I509 Heart failure, unspecified: Secondary | ICD-10-CM | POA: Diagnosis not present

## 2018-03-12 DIAGNOSIS — J918 Pleural effusion in other conditions classified elsewhere: Secondary | ICD-10-CM | POA: Diagnosis not present

## 2018-03-12 DIAGNOSIS — C787 Secondary malignant neoplasm of liver and intrahepatic bile duct: Secondary | ICD-10-CM | POA: Diagnosis not present

## 2018-03-12 DIAGNOSIS — I509 Heart failure, unspecified: Secondary | ICD-10-CM | POA: Diagnosis not present

## 2018-03-12 DIAGNOSIS — C50919 Malignant neoplasm of unspecified site of unspecified female breast: Secondary | ICD-10-CM | POA: Diagnosis not present

## 2018-03-12 DIAGNOSIS — I25810 Atherosclerosis of coronary artery bypass graft(s) without angina pectoris: Secondary | ICD-10-CM | POA: Diagnosis not present

## 2018-03-12 DIAGNOSIS — C49A0 Gastrointestinal stromal tumor, unspecified site: Secondary | ICD-10-CM | POA: Diagnosis not present

## 2018-03-19 DIAGNOSIS — J918 Pleural effusion in other conditions classified elsewhere: Secondary | ICD-10-CM | POA: Diagnosis not present

## 2018-03-19 DIAGNOSIS — C787 Secondary malignant neoplasm of liver and intrahepatic bile duct: Secondary | ICD-10-CM | POA: Diagnosis not present

## 2018-03-19 DIAGNOSIS — I509 Heart failure, unspecified: Secondary | ICD-10-CM | POA: Diagnosis not present

## 2018-03-19 DIAGNOSIS — I25810 Atherosclerosis of coronary artery bypass graft(s) without angina pectoris: Secondary | ICD-10-CM | POA: Diagnosis not present

## 2018-03-19 DIAGNOSIS — C50919 Malignant neoplasm of unspecified site of unspecified female breast: Secondary | ICD-10-CM | POA: Diagnosis not present

## 2018-03-19 DIAGNOSIS — C49A0 Gastrointestinal stromal tumor, unspecified site: Secondary | ICD-10-CM | POA: Diagnosis not present

## 2018-03-26 DIAGNOSIS — C50919 Malignant neoplasm of unspecified site of unspecified female breast: Secondary | ICD-10-CM | POA: Diagnosis not present

## 2018-03-26 DIAGNOSIS — I509 Heart failure, unspecified: Secondary | ICD-10-CM | POA: Diagnosis not present

## 2018-03-26 DIAGNOSIS — C787 Secondary malignant neoplasm of liver and intrahepatic bile duct: Secondary | ICD-10-CM | POA: Diagnosis not present

## 2018-03-26 DIAGNOSIS — I25810 Atherosclerosis of coronary artery bypass graft(s) without angina pectoris: Secondary | ICD-10-CM | POA: Diagnosis not present

## 2018-03-26 DIAGNOSIS — C49A0 Gastrointestinal stromal tumor, unspecified site: Secondary | ICD-10-CM | POA: Diagnosis not present

## 2018-03-26 DIAGNOSIS — J918 Pleural effusion in other conditions classified elsewhere: Secondary | ICD-10-CM | POA: Diagnosis not present

## 2018-03-30 DIAGNOSIS — I509 Heart failure, unspecified: Secondary | ICD-10-CM | POA: Diagnosis not present

## 2018-03-30 DIAGNOSIS — C49A0 Gastrointestinal stromal tumor, unspecified site: Secondary | ICD-10-CM | POA: Diagnosis not present

## 2018-03-30 DIAGNOSIS — I25810 Atherosclerosis of coronary artery bypass graft(s) without angina pectoris: Secondary | ICD-10-CM | POA: Diagnosis not present

## 2018-03-30 DIAGNOSIS — C50919 Malignant neoplasm of unspecified site of unspecified female breast: Secondary | ICD-10-CM | POA: Diagnosis not present

## 2018-03-30 DIAGNOSIS — C787 Secondary malignant neoplasm of liver and intrahepatic bile duct: Secondary | ICD-10-CM | POA: Diagnosis not present

## 2018-03-30 DIAGNOSIS — J918 Pleural effusion in other conditions classified elsewhere: Secondary | ICD-10-CM | POA: Diagnosis not present

## 2018-03-31 DIAGNOSIS — I25810 Atherosclerosis of coronary artery bypass graft(s) without angina pectoris: Secondary | ICD-10-CM | POA: Diagnosis not present

## 2018-03-31 DIAGNOSIS — C50919 Malignant neoplasm of unspecified site of unspecified female breast: Secondary | ICD-10-CM | POA: Diagnosis not present

## 2018-03-31 DIAGNOSIS — I509 Heart failure, unspecified: Secondary | ICD-10-CM | POA: Diagnosis not present

## 2018-03-31 DIAGNOSIS — C787 Secondary malignant neoplasm of liver and intrahepatic bile duct: Secondary | ICD-10-CM | POA: Diagnosis not present

## 2018-03-31 DIAGNOSIS — C49A0 Gastrointestinal stromal tumor, unspecified site: Secondary | ICD-10-CM | POA: Diagnosis not present

## 2018-03-31 DIAGNOSIS — J918 Pleural effusion in other conditions classified elsewhere: Secondary | ICD-10-CM | POA: Diagnosis not present

## 2018-04-01 DIAGNOSIS — C49A0 Gastrointestinal stromal tumor, unspecified site: Secondary | ICD-10-CM | POA: Diagnosis not present

## 2018-04-01 DIAGNOSIS — I509 Heart failure, unspecified: Secondary | ICD-10-CM | POA: Diagnosis not present

## 2018-04-01 DIAGNOSIS — J918 Pleural effusion in other conditions classified elsewhere: Secondary | ICD-10-CM | POA: Diagnosis not present

## 2018-04-01 DIAGNOSIS — C787 Secondary malignant neoplasm of liver and intrahepatic bile duct: Secondary | ICD-10-CM | POA: Diagnosis not present

## 2018-04-01 DIAGNOSIS — C50919 Malignant neoplasm of unspecified site of unspecified female breast: Secondary | ICD-10-CM | POA: Diagnosis not present

## 2018-04-01 DIAGNOSIS — I25810 Atherosclerosis of coronary artery bypass graft(s) without angina pectoris: Secondary | ICD-10-CM | POA: Diagnosis not present

## 2018-04-02 DIAGNOSIS — J918 Pleural effusion in other conditions classified elsewhere: Secondary | ICD-10-CM | POA: Diagnosis not present

## 2018-04-02 DIAGNOSIS — I25810 Atherosclerosis of coronary artery bypass graft(s) without angina pectoris: Secondary | ICD-10-CM | POA: Diagnosis not present

## 2018-04-02 DIAGNOSIS — C787 Secondary malignant neoplasm of liver and intrahepatic bile duct: Secondary | ICD-10-CM | POA: Diagnosis not present

## 2018-04-02 DIAGNOSIS — C50919 Malignant neoplasm of unspecified site of unspecified female breast: Secondary | ICD-10-CM | POA: Diagnosis not present

## 2018-04-02 DIAGNOSIS — I509 Heart failure, unspecified: Secondary | ICD-10-CM | POA: Diagnosis not present

## 2018-04-02 DIAGNOSIS — C49A0 Gastrointestinal stromal tumor, unspecified site: Secondary | ICD-10-CM | POA: Diagnosis not present

## 2018-04-03 DIAGNOSIS — C50919 Malignant neoplasm of unspecified site of unspecified female breast: Secondary | ICD-10-CM | POA: Diagnosis not present

## 2018-04-03 DIAGNOSIS — I4891 Unspecified atrial fibrillation: Secondary | ICD-10-CM | POA: Diagnosis not present

## 2018-04-03 DIAGNOSIS — I509 Heart failure, unspecified: Secondary | ICD-10-CM | POA: Diagnosis not present

## 2018-04-03 DIAGNOSIS — N3281 Overactive bladder: Secondary | ICD-10-CM | POA: Diagnosis not present

## 2018-04-03 DIAGNOSIS — N183 Chronic kidney disease, stage 3 (moderate): Secondary | ICD-10-CM | POA: Diagnosis not present

## 2018-04-03 DIAGNOSIS — C49A0 Gastrointestinal stromal tumor, unspecified site: Secondary | ICD-10-CM | POA: Diagnosis not present

## 2018-04-03 DIAGNOSIS — I10 Essential (primary) hypertension: Secondary | ICD-10-CM | POA: Diagnosis not present

## 2018-04-03 DIAGNOSIS — J918 Pleural effusion in other conditions classified elsewhere: Secondary | ICD-10-CM | POA: Diagnosis not present

## 2018-04-03 DIAGNOSIS — E785 Hyperlipidemia, unspecified: Secondary | ICD-10-CM | POA: Diagnosis not present

## 2018-04-03 DIAGNOSIS — C787 Secondary malignant neoplasm of liver and intrahepatic bile duct: Secondary | ICD-10-CM | POA: Diagnosis not present

## 2018-04-03 DIAGNOSIS — I25810 Atherosclerosis of coronary artery bypass graft(s) without angina pectoris: Secondary | ICD-10-CM | POA: Diagnosis not present

## 2018-04-03 DIAGNOSIS — G309 Alzheimer's disease, unspecified: Secondary | ICD-10-CM | POA: Diagnosis not present

## 2018-04-05 DIAGNOSIS — C787 Secondary malignant neoplasm of liver and intrahepatic bile duct: Secondary | ICD-10-CM | POA: Diagnosis not present

## 2018-04-05 DIAGNOSIS — J918 Pleural effusion in other conditions classified elsewhere: Secondary | ICD-10-CM | POA: Diagnosis not present

## 2018-04-05 DIAGNOSIS — I509 Heart failure, unspecified: Secondary | ICD-10-CM | POA: Diagnosis not present

## 2018-04-05 DIAGNOSIS — I25810 Atherosclerosis of coronary artery bypass graft(s) without angina pectoris: Secondary | ICD-10-CM | POA: Diagnosis not present

## 2018-04-05 DIAGNOSIS — C50919 Malignant neoplasm of unspecified site of unspecified female breast: Secondary | ICD-10-CM | POA: Diagnosis not present

## 2018-04-05 DIAGNOSIS — C49A0 Gastrointestinal stromal tumor, unspecified site: Secondary | ICD-10-CM | POA: Diagnosis not present

## 2018-04-06 DIAGNOSIS — J918 Pleural effusion in other conditions classified elsewhere: Secondary | ICD-10-CM | POA: Diagnosis not present

## 2018-04-06 DIAGNOSIS — C49A0 Gastrointestinal stromal tumor, unspecified site: Secondary | ICD-10-CM | POA: Diagnosis not present

## 2018-04-06 DIAGNOSIS — C50919 Malignant neoplasm of unspecified site of unspecified female breast: Secondary | ICD-10-CM | POA: Diagnosis not present

## 2018-04-06 DIAGNOSIS — C787 Secondary malignant neoplasm of liver and intrahepatic bile duct: Secondary | ICD-10-CM | POA: Diagnosis not present

## 2018-04-06 DIAGNOSIS — I509 Heart failure, unspecified: Secondary | ICD-10-CM | POA: Diagnosis not present

## 2018-04-06 DIAGNOSIS — I25810 Atherosclerosis of coronary artery bypass graft(s) without angina pectoris: Secondary | ICD-10-CM | POA: Diagnosis not present

## 2018-04-08 DIAGNOSIS — C787 Secondary malignant neoplasm of liver and intrahepatic bile duct: Secondary | ICD-10-CM | POA: Diagnosis not present

## 2018-04-08 DIAGNOSIS — J918 Pleural effusion in other conditions classified elsewhere: Secondary | ICD-10-CM | POA: Diagnosis not present

## 2018-04-08 DIAGNOSIS — I509 Heart failure, unspecified: Secondary | ICD-10-CM | POA: Diagnosis not present

## 2018-04-08 DIAGNOSIS — I25810 Atherosclerosis of coronary artery bypass graft(s) without angina pectoris: Secondary | ICD-10-CM | POA: Diagnosis not present

## 2018-04-08 DIAGNOSIS — C50919 Malignant neoplasm of unspecified site of unspecified female breast: Secondary | ICD-10-CM | POA: Diagnosis not present

## 2018-04-08 DIAGNOSIS — C49A0 Gastrointestinal stromal tumor, unspecified site: Secondary | ICD-10-CM | POA: Diagnosis not present

## 2018-04-09 DIAGNOSIS — J918 Pleural effusion in other conditions classified elsewhere: Secondary | ICD-10-CM | POA: Diagnosis not present

## 2018-04-09 DIAGNOSIS — C50919 Malignant neoplasm of unspecified site of unspecified female breast: Secondary | ICD-10-CM | POA: Diagnosis not present

## 2018-04-09 DIAGNOSIS — C787 Secondary malignant neoplasm of liver and intrahepatic bile duct: Secondary | ICD-10-CM | POA: Diagnosis not present

## 2018-04-09 DIAGNOSIS — I509 Heart failure, unspecified: Secondary | ICD-10-CM | POA: Diagnosis not present

## 2018-04-09 DIAGNOSIS — I25810 Atherosclerosis of coronary artery bypass graft(s) without angina pectoris: Secondary | ICD-10-CM | POA: Diagnosis not present

## 2018-04-09 DIAGNOSIS — C49A0 Gastrointestinal stromal tumor, unspecified site: Secondary | ICD-10-CM | POA: Diagnosis not present

## 2018-04-12 DIAGNOSIS — C787 Secondary malignant neoplasm of liver and intrahepatic bile duct: Secondary | ICD-10-CM | POA: Diagnosis not present

## 2018-04-12 DIAGNOSIS — J918 Pleural effusion in other conditions classified elsewhere: Secondary | ICD-10-CM | POA: Diagnosis not present

## 2018-04-12 DIAGNOSIS — I509 Heart failure, unspecified: Secondary | ICD-10-CM | POA: Diagnosis not present

## 2018-04-12 DIAGNOSIS — C49A0 Gastrointestinal stromal tumor, unspecified site: Secondary | ICD-10-CM | POA: Diagnosis not present

## 2018-04-12 DIAGNOSIS — C50919 Malignant neoplasm of unspecified site of unspecified female breast: Secondary | ICD-10-CM | POA: Diagnosis not present

## 2018-04-12 DIAGNOSIS — I25810 Atherosclerosis of coronary artery bypass graft(s) without angina pectoris: Secondary | ICD-10-CM | POA: Diagnosis not present

## 2018-04-13 DIAGNOSIS — I509 Heart failure, unspecified: Secondary | ICD-10-CM | POA: Diagnosis not present

## 2018-04-13 DIAGNOSIS — J918 Pleural effusion in other conditions classified elsewhere: Secondary | ICD-10-CM | POA: Diagnosis not present

## 2018-04-13 DIAGNOSIS — C50919 Malignant neoplasm of unspecified site of unspecified female breast: Secondary | ICD-10-CM | POA: Diagnosis not present

## 2018-04-13 DIAGNOSIS — I25810 Atherosclerosis of coronary artery bypass graft(s) without angina pectoris: Secondary | ICD-10-CM | POA: Diagnosis not present

## 2018-04-13 DIAGNOSIS — C49A0 Gastrointestinal stromal tumor, unspecified site: Secondary | ICD-10-CM | POA: Diagnosis not present

## 2018-04-13 DIAGNOSIS — C787 Secondary malignant neoplasm of liver and intrahepatic bile duct: Secondary | ICD-10-CM | POA: Diagnosis not present

## 2018-04-15 DIAGNOSIS — I509 Heart failure, unspecified: Secondary | ICD-10-CM | POA: Diagnosis not present

## 2018-04-15 DIAGNOSIS — C787 Secondary malignant neoplasm of liver and intrahepatic bile duct: Secondary | ICD-10-CM | POA: Diagnosis not present

## 2018-04-15 DIAGNOSIS — J918 Pleural effusion in other conditions classified elsewhere: Secondary | ICD-10-CM | POA: Diagnosis not present

## 2018-04-15 DIAGNOSIS — C49A0 Gastrointestinal stromal tumor, unspecified site: Secondary | ICD-10-CM | POA: Diagnosis not present

## 2018-04-15 DIAGNOSIS — I25810 Atherosclerosis of coronary artery bypass graft(s) without angina pectoris: Secondary | ICD-10-CM | POA: Diagnosis not present

## 2018-04-15 DIAGNOSIS — C50919 Malignant neoplasm of unspecified site of unspecified female breast: Secondary | ICD-10-CM | POA: Diagnosis not present

## 2018-04-16 DIAGNOSIS — J918 Pleural effusion in other conditions classified elsewhere: Secondary | ICD-10-CM | POA: Diagnosis not present

## 2018-04-16 DIAGNOSIS — C50919 Malignant neoplasm of unspecified site of unspecified female breast: Secondary | ICD-10-CM | POA: Diagnosis not present

## 2018-04-16 DIAGNOSIS — C787 Secondary malignant neoplasm of liver and intrahepatic bile duct: Secondary | ICD-10-CM | POA: Diagnosis not present

## 2018-04-16 DIAGNOSIS — I509 Heart failure, unspecified: Secondary | ICD-10-CM | POA: Diagnosis not present

## 2018-04-16 DIAGNOSIS — C49A0 Gastrointestinal stromal tumor, unspecified site: Secondary | ICD-10-CM | POA: Diagnosis not present

## 2018-04-16 DIAGNOSIS — I25810 Atherosclerosis of coronary artery bypass graft(s) without angina pectoris: Secondary | ICD-10-CM | POA: Diagnosis not present

## 2018-04-20 DIAGNOSIS — C50919 Malignant neoplasm of unspecified site of unspecified female breast: Secondary | ICD-10-CM | POA: Diagnosis not present

## 2018-04-20 DIAGNOSIS — I25810 Atherosclerosis of coronary artery bypass graft(s) without angina pectoris: Secondary | ICD-10-CM | POA: Diagnosis not present

## 2018-04-20 DIAGNOSIS — J918 Pleural effusion in other conditions classified elsewhere: Secondary | ICD-10-CM | POA: Diagnosis not present

## 2018-04-20 DIAGNOSIS — C49A0 Gastrointestinal stromal tumor, unspecified site: Secondary | ICD-10-CM | POA: Diagnosis not present

## 2018-04-20 DIAGNOSIS — I509 Heart failure, unspecified: Secondary | ICD-10-CM | POA: Diagnosis not present

## 2018-04-20 DIAGNOSIS — C787 Secondary malignant neoplasm of liver and intrahepatic bile duct: Secondary | ICD-10-CM | POA: Diagnosis not present

## 2018-04-23 DIAGNOSIS — C787 Secondary malignant neoplasm of liver and intrahepatic bile duct: Secondary | ICD-10-CM | POA: Diagnosis not present

## 2018-04-23 DIAGNOSIS — J918 Pleural effusion in other conditions classified elsewhere: Secondary | ICD-10-CM | POA: Diagnosis not present

## 2018-04-23 DIAGNOSIS — C49A0 Gastrointestinal stromal tumor, unspecified site: Secondary | ICD-10-CM | POA: Diagnosis not present

## 2018-04-23 DIAGNOSIS — I509 Heart failure, unspecified: Secondary | ICD-10-CM | POA: Diagnosis not present

## 2018-04-23 DIAGNOSIS — C50919 Malignant neoplasm of unspecified site of unspecified female breast: Secondary | ICD-10-CM | POA: Diagnosis not present

## 2018-04-23 DIAGNOSIS — I25810 Atherosclerosis of coronary artery bypass graft(s) without angina pectoris: Secondary | ICD-10-CM | POA: Diagnosis not present

## 2018-04-26 DIAGNOSIS — J918 Pleural effusion in other conditions classified elsewhere: Secondary | ICD-10-CM | POA: Diagnosis not present

## 2018-04-26 DIAGNOSIS — I25810 Atherosclerosis of coronary artery bypass graft(s) without angina pectoris: Secondary | ICD-10-CM | POA: Diagnosis not present

## 2018-04-26 DIAGNOSIS — C50919 Malignant neoplasm of unspecified site of unspecified female breast: Secondary | ICD-10-CM | POA: Diagnosis not present

## 2018-04-26 DIAGNOSIS — C49A0 Gastrointestinal stromal tumor, unspecified site: Secondary | ICD-10-CM | POA: Diagnosis not present

## 2018-04-26 DIAGNOSIS — C787 Secondary malignant neoplasm of liver and intrahepatic bile duct: Secondary | ICD-10-CM | POA: Diagnosis not present

## 2018-04-26 DIAGNOSIS — I509 Heart failure, unspecified: Secondary | ICD-10-CM | POA: Diagnosis not present

## 2018-04-29 DIAGNOSIS — J918 Pleural effusion in other conditions classified elsewhere: Secondary | ICD-10-CM | POA: Diagnosis not present

## 2018-04-29 DIAGNOSIS — I509 Heart failure, unspecified: Secondary | ICD-10-CM | POA: Diagnosis not present

## 2018-04-29 DIAGNOSIS — C787 Secondary malignant neoplasm of liver and intrahepatic bile duct: Secondary | ICD-10-CM | POA: Diagnosis not present

## 2018-04-29 DIAGNOSIS — C50919 Malignant neoplasm of unspecified site of unspecified female breast: Secondary | ICD-10-CM | POA: Diagnosis not present

## 2018-04-29 DIAGNOSIS — I25810 Atherosclerosis of coronary artery bypass graft(s) without angina pectoris: Secondary | ICD-10-CM | POA: Diagnosis not present

## 2018-04-29 DIAGNOSIS — C49A0 Gastrointestinal stromal tumor, unspecified site: Secondary | ICD-10-CM | POA: Diagnosis not present

## 2018-05-03 DIAGNOSIS — J918 Pleural effusion in other conditions classified elsewhere: Secondary | ICD-10-CM | POA: Diagnosis not present

## 2018-05-03 DIAGNOSIS — E785 Hyperlipidemia, unspecified: Secondary | ICD-10-CM | POA: Diagnosis not present

## 2018-05-03 DIAGNOSIS — I509 Heart failure, unspecified: Secondary | ICD-10-CM | POA: Diagnosis not present

## 2018-05-03 DIAGNOSIS — I10 Essential (primary) hypertension: Secondary | ICD-10-CM | POA: Diagnosis not present

## 2018-05-03 DIAGNOSIS — C49A0 Gastrointestinal stromal tumor, unspecified site: Secondary | ICD-10-CM | POA: Diagnosis not present

## 2018-05-03 DIAGNOSIS — N3281 Overactive bladder: Secondary | ICD-10-CM | POA: Diagnosis not present

## 2018-05-03 DIAGNOSIS — I25810 Atherosclerosis of coronary artery bypass graft(s) without angina pectoris: Secondary | ICD-10-CM | POA: Diagnosis not present

## 2018-05-03 DIAGNOSIS — C50919 Malignant neoplasm of unspecified site of unspecified female breast: Secondary | ICD-10-CM | POA: Diagnosis not present

## 2018-05-03 DIAGNOSIS — N183 Chronic kidney disease, stage 3 (moderate): Secondary | ICD-10-CM | POA: Diagnosis not present

## 2018-05-03 DIAGNOSIS — I4891 Unspecified atrial fibrillation: Secondary | ICD-10-CM | POA: Diagnosis not present

## 2018-05-03 DIAGNOSIS — C787 Secondary malignant neoplasm of liver and intrahepatic bile duct: Secondary | ICD-10-CM | POA: Diagnosis not present

## 2018-05-03 DIAGNOSIS — G309 Alzheimer's disease, unspecified: Secondary | ICD-10-CM | POA: Diagnosis not present

## 2018-05-07 DIAGNOSIS — J918 Pleural effusion in other conditions classified elsewhere: Secondary | ICD-10-CM | POA: Diagnosis not present

## 2018-05-07 DIAGNOSIS — I25810 Atherosclerosis of coronary artery bypass graft(s) without angina pectoris: Secondary | ICD-10-CM | POA: Diagnosis not present

## 2018-05-07 DIAGNOSIS — C49A0 Gastrointestinal stromal tumor, unspecified site: Secondary | ICD-10-CM | POA: Diagnosis not present

## 2018-05-07 DIAGNOSIS — C50919 Malignant neoplasm of unspecified site of unspecified female breast: Secondary | ICD-10-CM | POA: Diagnosis not present

## 2018-05-07 DIAGNOSIS — C787 Secondary malignant neoplasm of liver and intrahepatic bile duct: Secondary | ICD-10-CM | POA: Diagnosis not present

## 2018-05-07 DIAGNOSIS — I509 Heart failure, unspecified: Secondary | ICD-10-CM | POA: Diagnosis not present

## 2018-05-10 DIAGNOSIS — C49A0 Gastrointestinal stromal tumor, unspecified site: Secondary | ICD-10-CM | POA: Diagnosis not present

## 2018-05-10 DIAGNOSIS — J918 Pleural effusion in other conditions classified elsewhere: Secondary | ICD-10-CM | POA: Diagnosis not present

## 2018-05-10 DIAGNOSIS — I25810 Atherosclerosis of coronary artery bypass graft(s) without angina pectoris: Secondary | ICD-10-CM | POA: Diagnosis not present

## 2018-05-10 DIAGNOSIS — C50919 Malignant neoplasm of unspecified site of unspecified female breast: Secondary | ICD-10-CM | POA: Diagnosis not present

## 2018-05-10 DIAGNOSIS — I509 Heart failure, unspecified: Secondary | ICD-10-CM | POA: Diagnosis not present

## 2018-05-10 DIAGNOSIS — C787 Secondary malignant neoplasm of liver and intrahepatic bile duct: Secondary | ICD-10-CM | POA: Diagnosis not present

## 2018-05-14 DIAGNOSIS — J918 Pleural effusion in other conditions classified elsewhere: Secondary | ICD-10-CM | POA: Diagnosis not present

## 2018-05-14 DIAGNOSIS — I25810 Atherosclerosis of coronary artery bypass graft(s) without angina pectoris: Secondary | ICD-10-CM | POA: Diagnosis not present

## 2018-05-14 DIAGNOSIS — I509 Heart failure, unspecified: Secondary | ICD-10-CM | POA: Diagnosis not present

## 2018-05-14 DIAGNOSIS — C49A0 Gastrointestinal stromal tumor, unspecified site: Secondary | ICD-10-CM | POA: Diagnosis not present

## 2018-05-14 DIAGNOSIS — C787 Secondary malignant neoplasm of liver and intrahepatic bile duct: Secondary | ICD-10-CM | POA: Diagnosis not present

## 2018-05-14 DIAGNOSIS — C50919 Malignant neoplasm of unspecified site of unspecified female breast: Secondary | ICD-10-CM | POA: Diagnosis not present

## 2018-05-17 DIAGNOSIS — C49A0 Gastrointestinal stromal tumor, unspecified site: Secondary | ICD-10-CM | POA: Diagnosis not present

## 2018-05-17 DIAGNOSIS — J918 Pleural effusion in other conditions classified elsewhere: Secondary | ICD-10-CM | POA: Diagnosis not present

## 2018-05-17 DIAGNOSIS — C50919 Malignant neoplasm of unspecified site of unspecified female breast: Secondary | ICD-10-CM | POA: Diagnosis not present

## 2018-05-17 DIAGNOSIS — I509 Heart failure, unspecified: Secondary | ICD-10-CM | POA: Diagnosis not present

## 2018-05-17 DIAGNOSIS — I25810 Atherosclerosis of coronary artery bypass graft(s) without angina pectoris: Secondary | ICD-10-CM | POA: Diagnosis not present

## 2018-05-17 DIAGNOSIS — C787 Secondary malignant neoplasm of liver and intrahepatic bile duct: Secondary | ICD-10-CM | POA: Diagnosis not present

## 2018-05-20 DIAGNOSIS — I25810 Atherosclerosis of coronary artery bypass graft(s) without angina pectoris: Secondary | ICD-10-CM | POA: Diagnosis not present

## 2018-05-20 DIAGNOSIS — C50919 Malignant neoplasm of unspecified site of unspecified female breast: Secondary | ICD-10-CM | POA: Diagnosis not present

## 2018-05-20 DIAGNOSIS — J918 Pleural effusion in other conditions classified elsewhere: Secondary | ICD-10-CM | POA: Diagnosis not present

## 2018-05-20 DIAGNOSIS — C49A0 Gastrointestinal stromal tumor, unspecified site: Secondary | ICD-10-CM | POA: Diagnosis not present

## 2018-05-20 DIAGNOSIS — C787 Secondary malignant neoplasm of liver and intrahepatic bile duct: Secondary | ICD-10-CM | POA: Diagnosis not present

## 2018-05-20 DIAGNOSIS — I509 Heart failure, unspecified: Secondary | ICD-10-CM | POA: Diagnosis not present

## 2018-05-21 DIAGNOSIS — I509 Heart failure, unspecified: Secondary | ICD-10-CM | POA: Diagnosis not present

## 2018-05-21 DIAGNOSIS — C50919 Malignant neoplasm of unspecified site of unspecified female breast: Secondary | ICD-10-CM | POA: Diagnosis not present

## 2018-05-21 DIAGNOSIS — C787 Secondary malignant neoplasm of liver and intrahepatic bile duct: Secondary | ICD-10-CM | POA: Diagnosis not present

## 2018-05-21 DIAGNOSIS — I25810 Atherosclerosis of coronary artery bypass graft(s) without angina pectoris: Secondary | ICD-10-CM | POA: Diagnosis not present

## 2018-05-21 DIAGNOSIS — J918 Pleural effusion in other conditions classified elsewhere: Secondary | ICD-10-CM | POA: Diagnosis not present

## 2018-05-21 DIAGNOSIS — C49A0 Gastrointestinal stromal tumor, unspecified site: Secondary | ICD-10-CM | POA: Diagnosis not present

## 2018-05-22 DIAGNOSIS — C787 Secondary malignant neoplasm of liver and intrahepatic bile duct: Secondary | ICD-10-CM | POA: Diagnosis not present

## 2018-05-22 DIAGNOSIS — J918 Pleural effusion in other conditions classified elsewhere: Secondary | ICD-10-CM | POA: Diagnosis not present

## 2018-05-22 DIAGNOSIS — I509 Heart failure, unspecified: Secondary | ICD-10-CM | POA: Diagnosis not present

## 2018-05-22 DIAGNOSIS — C50919 Malignant neoplasm of unspecified site of unspecified female breast: Secondary | ICD-10-CM | POA: Diagnosis not present

## 2018-05-22 DIAGNOSIS — C49A0 Gastrointestinal stromal tumor, unspecified site: Secondary | ICD-10-CM | POA: Diagnosis not present

## 2018-05-22 DIAGNOSIS — I25810 Atherosclerosis of coronary artery bypass graft(s) without angina pectoris: Secondary | ICD-10-CM | POA: Diagnosis not present

## 2018-05-24 DIAGNOSIS — I25810 Atherosclerosis of coronary artery bypass graft(s) without angina pectoris: Secondary | ICD-10-CM | POA: Diagnosis not present

## 2018-05-24 DIAGNOSIS — I509 Heart failure, unspecified: Secondary | ICD-10-CM | POA: Diagnosis not present

## 2018-05-24 DIAGNOSIS — C49A0 Gastrointestinal stromal tumor, unspecified site: Secondary | ICD-10-CM | POA: Diagnosis not present

## 2018-05-24 DIAGNOSIS — J918 Pleural effusion in other conditions classified elsewhere: Secondary | ICD-10-CM | POA: Diagnosis not present

## 2018-05-24 DIAGNOSIS — C787 Secondary malignant neoplasm of liver and intrahepatic bile duct: Secondary | ICD-10-CM | POA: Diagnosis not present

## 2018-05-24 DIAGNOSIS — C50919 Malignant neoplasm of unspecified site of unspecified female breast: Secondary | ICD-10-CM | POA: Diagnosis not present

## 2018-05-26 DIAGNOSIS — C49A0 Gastrointestinal stromal tumor, unspecified site: Secondary | ICD-10-CM | POA: Diagnosis not present

## 2018-05-26 DIAGNOSIS — C50919 Malignant neoplasm of unspecified site of unspecified female breast: Secondary | ICD-10-CM | POA: Diagnosis not present

## 2018-05-26 DIAGNOSIS — J918 Pleural effusion in other conditions classified elsewhere: Secondary | ICD-10-CM | POA: Diagnosis not present

## 2018-05-26 DIAGNOSIS — I509 Heart failure, unspecified: Secondary | ICD-10-CM | POA: Diagnosis not present

## 2018-05-26 DIAGNOSIS — C787 Secondary malignant neoplasm of liver and intrahepatic bile duct: Secondary | ICD-10-CM | POA: Diagnosis not present

## 2018-05-26 DIAGNOSIS — I25810 Atherosclerosis of coronary artery bypass graft(s) without angina pectoris: Secondary | ICD-10-CM | POA: Diagnosis not present

## 2018-05-28 DIAGNOSIS — I509 Heart failure, unspecified: Secondary | ICD-10-CM | POA: Diagnosis not present

## 2018-05-28 DIAGNOSIS — I25810 Atherosclerosis of coronary artery bypass graft(s) without angina pectoris: Secondary | ICD-10-CM | POA: Diagnosis not present

## 2018-05-28 DIAGNOSIS — J918 Pleural effusion in other conditions classified elsewhere: Secondary | ICD-10-CM | POA: Diagnosis not present

## 2018-05-28 DIAGNOSIS — C787 Secondary malignant neoplasm of liver and intrahepatic bile duct: Secondary | ICD-10-CM | POA: Diagnosis not present

## 2018-05-28 DIAGNOSIS — C50919 Malignant neoplasm of unspecified site of unspecified female breast: Secondary | ICD-10-CM | POA: Diagnosis not present

## 2018-05-28 DIAGNOSIS — C49A0 Gastrointestinal stromal tumor, unspecified site: Secondary | ICD-10-CM | POA: Diagnosis not present

## 2018-05-30 DIAGNOSIS — I25810 Atherosclerosis of coronary artery bypass graft(s) without angina pectoris: Secondary | ICD-10-CM | POA: Diagnosis not present

## 2018-05-30 DIAGNOSIS — J918 Pleural effusion in other conditions classified elsewhere: Secondary | ICD-10-CM | POA: Diagnosis not present

## 2018-05-30 DIAGNOSIS — I509 Heart failure, unspecified: Secondary | ICD-10-CM | POA: Diagnosis not present

## 2018-05-30 DIAGNOSIS — C50919 Malignant neoplasm of unspecified site of unspecified female breast: Secondary | ICD-10-CM | POA: Diagnosis not present

## 2018-05-30 DIAGNOSIS — C49A0 Gastrointestinal stromal tumor, unspecified site: Secondary | ICD-10-CM | POA: Diagnosis not present

## 2018-05-30 DIAGNOSIS — C787 Secondary malignant neoplasm of liver and intrahepatic bile duct: Secondary | ICD-10-CM | POA: Diagnosis not present

## 2018-06-01 DIAGNOSIS — C787 Secondary malignant neoplasm of liver and intrahepatic bile duct: Secondary | ICD-10-CM | POA: Diagnosis not present

## 2018-06-01 DIAGNOSIS — C49A0 Gastrointestinal stromal tumor, unspecified site: Secondary | ICD-10-CM | POA: Diagnosis not present

## 2018-06-01 DIAGNOSIS — C50919 Malignant neoplasm of unspecified site of unspecified female breast: Secondary | ICD-10-CM | POA: Diagnosis not present

## 2018-06-01 DIAGNOSIS — I25810 Atherosclerosis of coronary artery bypass graft(s) without angina pectoris: Secondary | ICD-10-CM | POA: Diagnosis not present

## 2018-06-01 DIAGNOSIS — I509 Heart failure, unspecified: Secondary | ICD-10-CM | POA: Diagnosis not present

## 2018-06-01 DIAGNOSIS — J918 Pleural effusion in other conditions classified elsewhere: Secondary | ICD-10-CM | POA: Diagnosis not present

## 2018-06-03 DIAGNOSIS — C787 Secondary malignant neoplasm of liver and intrahepatic bile duct: Secondary | ICD-10-CM | POA: Diagnosis not present

## 2018-06-03 DIAGNOSIS — J918 Pleural effusion in other conditions classified elsewhere: Secondary | ICD-10-CM | POA: Diagnosis not present

## 2018-06-03 DIAGNOSIS — C49A0 Gastrointestinal stromal tumor, unspecified site: Secondary | ICD-10-CM | POA: Diagnosis not present

## 2018-06-03 DIAGNOSIS — N183 Chronic kidney disease, stage 3 (moderate): Secondary | ICD-10-CM | POA: Diagnosis not present

## 2018-06-03 DIAGNOSIS — C50919 Malignant neoplasm of unspecified site of unspecified female breast: Secondary | ICD-10-CM | POA: Diagnosis not present

## 2018-06-03 DIAGNOSIS — G309 Alzheimer's disease, unspecified: Secondary | ICD-10-CM | POA: Diagnosis not present

## 2018-06-03 DIAGNOSIS — I509 Heart failure, unspecified: Secondary | ICD-10-CM | POA: Diagnosis not present

## 2018-06-03 DIAGNOSIS — I10 Essential (primary) hypertension: Secondary | ICD-10-CM | POA: Diagnosis not present

## 2018-06-03 DIAGNOSIS — E785 Hyperlipidemia, unspecified: Secondary | ICD-10-CM | POA: Diagnosis not present

## 2018-06-03 DIAGNOSIS — N3281 Overactive bladder: Secondary | ICD-10-CM | POA: Diagnosis not present

## 2018-06-03 DIAGNOSIS — I25810 Atherosclerosis of coronary artery bypass graft(s) without angina pectoris: Secondary | ICD-10-CM | POA: Diagnosis not present

## 2018-06-03 DIAGNOSIS — I4891 Unspecified atrial fibrillation: Secondary | ICD-10-CM | POA: Diagnosis not present

## 2018-06-05 DIAGNOSIS — I25810 Atherosclerosis of coronary artery bypass graft(s) without angina pectoris: Secondary | ICD-10-CM | POA: Diagnosis not present

## 2018-06-05 DIAGNOSIS — C49A0 Gastrointestinal stromal tumor, unspecified site: Secondary | ICD-10-CM | POA: Diagnosis not present

## 2018-06-05 DIAGNOSIS — C50919 Malignant neoplasm of unspecified site of unspecified female breast: Secondary | ICD-10-CM | POA: Diagnosis not present

## 2018-06-05 DIAGNOSIS — I509 Heart failure, unspecified: Secondary | ICD-10-CM | POA: Diagnosis not present

## 2018-06-05 DIAGNOSIS — C787 Secondary malignant neoplasm of liver and intrahepatic bile duct: Secondary | ICD-10-CM | POA: Diagnosis not present

## 2018-06-05 DIAGNOSIS — J918 Pleural effusion in other conditions classified elsewhere: Secondary | ICD-10-CM | POA: Diagnosis not present

## 2018-06-07 DIAGNOSIS — C787 Secondary malignant neoplasm of liver and intrahepatic bile duct: Secondary | ICD-10-CM | POA: Diagnosis not present

## 2018-06-07 DIAGNOSIS — C49A0 Gastrointestinal stromal tumor, unspecified site: Secondary | ICD-10-CM | POA: Diagnosis not present

## 2018-06-07 DIAGNOSIS — J918 Pleural effusion in other conditions classified elsewhere: Secondary | ICD-10-CM | POA: Diagnosis not present

## 2018-06-07 DIAGNOSIS — I25810 Atherosclerosis of coronary artery bypass graft(s) without angina pectoris: Secondary | ICD-10-CM | POA: Diagnosis not present

## 2018-06-07 DIAGNOSIS — C50919 Malignant neoplasm of unspecified site of unspecified female breast: Secondary | ICD-10-CM | POA: Diagnosis not present

## 2018-06-07 DIAGNOSIS — I509 Heart failure, unspecified: Secondary | ICD-10-CM | POA: Diagnosis not present

## 2018-06-09 DIAGNOSIS — C787 Secondary malignant neoplasm of liver and intrahepatic bile duct: Secondary | ICD-10-CM | POA: Diagnosis not present

## 2018-06-09 DIAGNOSIS — I509 Heart failure, unspecified: Secondary | ICD-10-CM | POA: Diagnosis not present

## 2018-06-09 DIAGNOSIS — C50919 Malignant neoplasm of unspecified site of unspecified female breast: Secondary | ICD-10-CM | POA: Diagnosis not present

## 2018-06-09 DIAGNOSIS — J918 Pleural effusion in other conditions classified elsewhere: Secondary | ICD-10-CM | POA: Diagnosis not present

## 2018-06-09 DIAGNOSIS — C49A0 Gastrointestinal stromal tumor, unspecified site: Secondary | ICD-10-CM | POA: Diagnosis not present

## 2018-06-09 DIAGNOSIS — I25810 Atherosclerosis of coronary artery bypass graft(s) without angina pectoris: Secondary | ICD-10-CM | POA: Diagnosis not present

## 2018-06-11 DIAGNOSIS — C50919 Malignant neoplasm of unspecified site of unspecified female breast: Secondary | ICD-10-CM | POA: Diagnosis not present

## 2018-06-11 DIAGNOSIS — C49A0 Gastrointestinal stromal tumor, unspecified site: Secondary | ICD-10-CM | POA: Diagnosis not present

## 2018-06-11 DIAGNOSIS — C787 Secondary malignant neoplasm of liver and intrahepatic bile duct: Secondary | ICD-10-CM | POA: Diagnosis not present

## 2018-06-11 DIAGNOSIS — I509 Heart failure, unspecified: Secondary | ICD-10-CM | POA: Diagnosis not present

## 2018-06-11 DIAGNOSIS — J918 Pleural effusion in other conditions classified elsewhere: Secondary | ICD-10-CM | POA: Diagnosis not present

## 2018-06-11 DIAGNOSIS — I25810 Atherosclerosis of coronary artery bypass graft(s) without angina pectoris: Secondary | ICD-10-CM | POA: Diagnosis not present

## 2018-06-13 DIAGNOSIS — I25810 Atherosclerosis of coronary artery bypass graft(s) without angina pectoris: Secondary | ICD-10-CM | POA: Diagnosis not present

## 2018-06-13 DIAGNOSIS — C787 Secondary malignant neoplasm of liver and intrahepatic bile duct: Secondary | ICD-10-CM | POA: Diagnosis not present

## 2018-06-13 DIAGNOSIS — C50919 Malignant neoplasm of unspecified site of unspecified female breast: Secondary | ICD-10-CM | POA: Diagnosis not present

## 2018-06-13 DIAGNOSIS — I509 Heart failure, unspecified: Secondary | ICD-10-CM | POA: Diagnosis not present

## 2018-06-13 DIAGNOSIS — C49A0 Gastrointestinal stromal tumor, unspecified site: Secondary | ICD-10-CM | POA: Diagnosis not present

## 2018-06-13 DIAGNOSIS — J918 Pleural effusion in other conditions classified elsewhere: Secondary | ICD-10-CM | POA: Diagnosis not present

## 2018-06-15 DIAGNOSIS — I25810 Atherosclerosis of coronary artery bypass graft(s) without angina pectoris: Secondary | ICD-10-CM | POA: Diagnosis not present

## 2018-06-15 DIAGNOSIS — C50919 Malignant neoplasm of unspecified site of unspecified female breast: Secondary | ICD-10-CM | POA: Diagnosis not present

## 2018-06-15 DIAGNOSIS — I509 Heart failure, unspecified: Secondary | ICD-10-CM | POA: Diagnosis not present

## 2018-06-15 DIAGNOSIS — J918 Pleural effusion in other conditions classified elsewhere: Secondary | ICD-10-CM | POA: Diagnosis not present

## 2018-06-15 DIAGNOSIS — C49A0 Gastrointestinal stromal tumor, unspecified site: Secondary | ICD-10-CM | POA: Diagnosis not present

## 2018-06-15 DIAGNOSIS — C787 Secondary malignant neoplasm of liver and intrahepatic bile duct: Secondary | ICD-10-CM | POA: Diagnosis not present

## 2018-06-17 DIAGNOSIS — C787 Secondary malignant neoplasm of liver and intrahepatic bile duct: Secondary | ICD-10-CM | POA: Diagnosis not present

## 2018-06-17 DIAGNOSIS — C50919 Malignant neoplasm of unspecified site of unspecified female breast: Secondary | ICD-10-CM | POA: Diagnosis not present

## 2018-06-17 DIAGNOSIS — I25810 Atherosclerosis of coronary artery bypass graft(s) without angina pectoris: Secondary | ICD-10-CM | POA: Diagnosis not present

## 2018-06-17 DIAGNOSIS — J918 Pleural effusion in other conditions classified elsewhere: Secondary | ICD-10-CM | POA: Diagnosis not present

## 2018-06-17 DIAGNOSIS — C49A0 Gastrointestinal stromal tumor, unspecified site: Secondary | ICD-10-CM | POA: Diagnosis not present

## 2018-06-17 DIAGNOSIS — I509 Heart failure, unspecified: Secondary | ICD-10-CM | POA: Diagnosis not present

## 2018-06-19 DIAGNOSIS — C50919 Malignant neoplasm of unspecified site of unspecified female breast: Secondary | ICD-10-CM | POA: Diagnosis not present

## 2018-06-19 DIAGNOSIS — I509 Heart failure, unspecified: Secondary | ICD-10-CM | POA: Diagnosis not present

## 2018-06-19 DIAGNOSIS — I25810 Atherosclerosis of coronary artery bypass graft(s) without angina pectoris: Secondary | ICD-10-CM | POA: Diagnosis not present

## 2018-06-19 DIAGNOSIS — C49A0 Gastrointestinal stromal tumor, unspecified site: Secondary | ICD-10-CM | POA: Diagnosis not present

## 2018-06-19 DIAGNOSIS — C787 Secondary malignant neoplasm of liver and intrahepatic bile duct: Secondary | ICD-10-CM | POA: Diagnosis not present

## 2018-06-19 DIAGNOSIS — J918 Pleural effusion in other conditions classified elsewhere: Secondary | ICD-10-CM | POA: Diagnosis not present

## 2018-06-21 DIAGNOSIS — C787 Secondary malignant neoplasm of liver and intrahepatic bile duct: Secondary | ICD-10-CM | POA: Diagnosis not present

## 2018-06-21 DIAGNOSIS — C49A0 Gastrointestinal stromal tumor, unspecified site: Secondary | ICD-10-CM | POA: Diagnosis not present

## 2018-06-21 DIAGNOSIS — I509 Heart failure, unspecified: Secondary | ICD-10-CM | POA: Diagnosis not present

## 2018-06-21 DIAGNOSIS — C50919 Malignant neoplasm of unspecified site of unspecified female breast: Secondary | ICD-10-CM | POA: Diagnosis not present

## 2018-06-21 DIAGNOSIS — I25810 Atherosclerosis of coronary artery bypass graft(s) without angina pectoris: Secondary | ICD-10-CM | POA: Diagnosis not present

## 2018-06-21 DIAGNOSIS — J918 Pleural effusion in other conditions classified elsewhere: Secondary | ICD-10-CM | POA: Diagnosis not present

## 2018-06-22 DIAGNOSIS — I25810 Atherosclerosis of coronary artery bypass graft(s) without angina pectoris: Secondary | ICD-10-CM | POA: Diagnosis not present

## 2018-06-22 DIAGNOSIS — C49A0 Gastrointestinal stromal tumor, unspecified site: Secondary | ICD-10-CM | POA: Diagnosis not present

## 2018-06-22 DIAGNOSIS — J918 Pleural effusion in other conditions classified elsewhere: Secondary | ICD-10-CM | POA: Diagnosis not present

## 2018-06-22 DIAGNOSIS — C50919 Malignant neoplasm of unspecified site of unspecified female breast: Secondary | ICD-10-CM | POA: Diagnosis not present

## 2018-06-22 DIAGNOSIS — C787 Secondary malignant neoplasm of liver and intrahepatic bile duct: Secondary | ICD-10-CM | POA: Diagnosis not present

## 2018-06-22 DIAGNOSIS — I509 Heart failure, unspecified: Secondary | ICD-10-CM | POA: Diagnosis not present

## 2018-06-23 DIAGNOSIS — C50919 Malignant neoplasm of unspecified site of unspecified female breast: Secondary | ICD-10-CM | POA: Diagnosis not present

## 2018-06-23 DIAGNOSIS — J918 Pleural effusion in other conditions classified elsewhere: Secondary | ICD-10-CM | POA: Diagnosis not present

## 2018-06-23 DIAGNOSIS — C49A0 Gastrointestinal stromal tumor, unspecified site: Secondary | ICD-10-CM | POA: Diagnosis not present

## 2018-06-23 DIAGNOSIS — I509 Heart failure, unspecified: Secondary | ICD-10-CM | POA: Diagnosis not present

## 2018-06-23 DIAGNOSIS — C787 Secondary malignant neoplasm of liver and intrahepatic bile duct: Secondary | ICD-10-CM | POA: Diagnosis not present

## 2018-06-23 DIAGNOSIS — I25810 Atherosclerosis of coronary artery bypass graft(s) without angina pectoris: Secondary | ICD-10-CM | POA: Diagnosis not present

## 2018-06-24 DIAGNOSIS — J918 Pleural effusion in other conditions classified elsewhere: Secondary | ICD-10-CM | POA: Diagnosis not present

## 2018-06-24 DIAGNOSIS — C49A0 Gastrointestinal stromal tumor, unspecified site: Secondary | ICD-10-CM | POA: Diagnosis not present

## 2018-06-24 DIAGNOSIS — C787 Secondary malignant neoplasm of liver and intrahepatic bile duct: Secondary | ICD-10-CM | POA: Diagnosis not present

## 2018-06-24 DIAGNOSIS — I25810 Atherosclerosis of coronary artery bypass graft(s) without angina pectoris: Secondary | ICD-10-CM | POA: Diagnosis not present

## 2018-06-24 DIAGNOSIS — C50919 Malignant neoplasm of unspecified site of unspecified female breast: Secondary | ICD-10-CM | POA: Diagnosis not present

## 2018-06-24 DIAGNOSIS — I509 Heart failure, unspecified: Secondary | ICD-10-CM | POA: Diagnosis not present

## 2018-06-25 DIAGNOSIS — C49A0 Gastrointestinal stromal tumor, unspecified site: Secondary | ICD-10-CM | POA: Diagnosis not present

## 2018-06-25 DIAGNOSIS — I509 Heart failure, unspecified: Secondary | ICD-10-CM | POA: Diagnosis not present

## 2018-06-25 DIAGNOSIS — J918 Pleural effusion in other conditions classified elsewhere: Secondary | ICD-10-CM | POA: Diagnosis not present

## 2018-06-25 DIAGNOSIS — I25810 Atherosclerosis of coronary artery bypass graft(s) without angina pectoris: Secondary | ICD-10-CM | POA: Diagnosis not present

## 2018-06-25 DIAGNOSIS — C50919 Malignant neoplasm of unspecified site of unspecified female breast: Secondary | ICD-10-CM | POA: Diagnosis not present

## 2018-06-25 DIAGNOSIS — C787 Secondary malignant neoplasm of liver and intrahepatic bile duct: Secondary | ICD-10-CM | POA: Diagnosis not present

## 2018-06-27 DIAGNOSIS — I25810 Atherosclerosis of coronary artery bypass graft(s) without angina pectoris: Secondary | ICD-10-CM | POA: Diagnosis not present

## 2018-06-27 DIAGNOSIS — C50919 Malignant neoplasm of unspecified site of unspecified female breast: Secondary | ICD-10-CM | POA: Diagnosis not present

## 2018-06-27 DIAGNOSIS — C49A0 Gastrointestinal stromal tumor, unspecified site: Secondary | ICD-10-CM | POA: Diagnosis not present

## 2018-06-27 DIAGNOSIS — C787 Secondary malignant neoplasm of liver and intrahepatic bile duct: Secondary | ICD-10-CM | POA: Diagnosis not present

## 2018-06-27 DIAGNOSIS — I509 Heart failure, unspecified: Secondary | ICD-10-CM | POA: Diagnosis not present

## 2018-06-27 DIAGNOSIS — J918 Pleural effusion in other conditions classified elsewhere: Secondary | ICD-10-CM | POA: Diagnosis not present

## 2018-07-04 DEATH — deceased

## 2019-02-28 IMAGING — DX DG ABDOMEN 1V
2 series · 2 of 2 positions shown · non-contrast
Comparison: CT scan of the chest dated 10/18/2017

CLINICAL DATA: Nausea and vomiting.

EXAM:
ABDOMEN - 1 VIEW

[abdomen kub (1 of 2)]
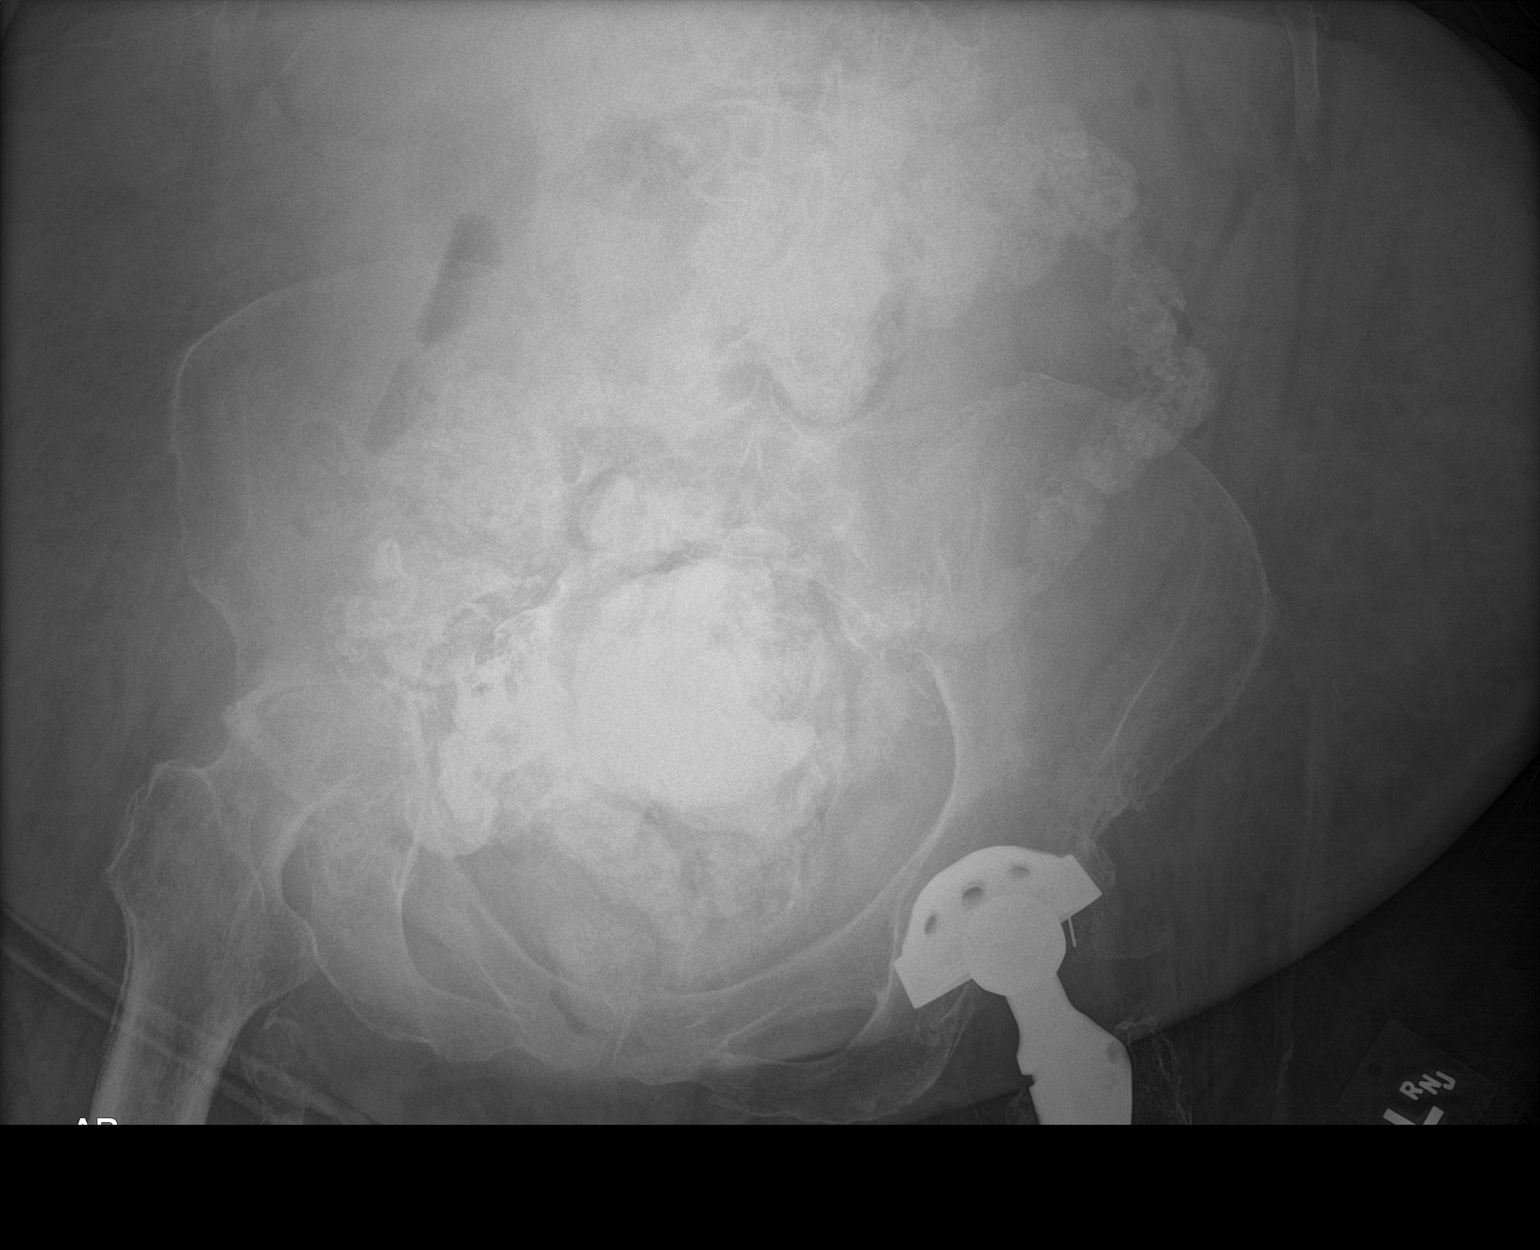

[abdomen kub (2 of 2)]
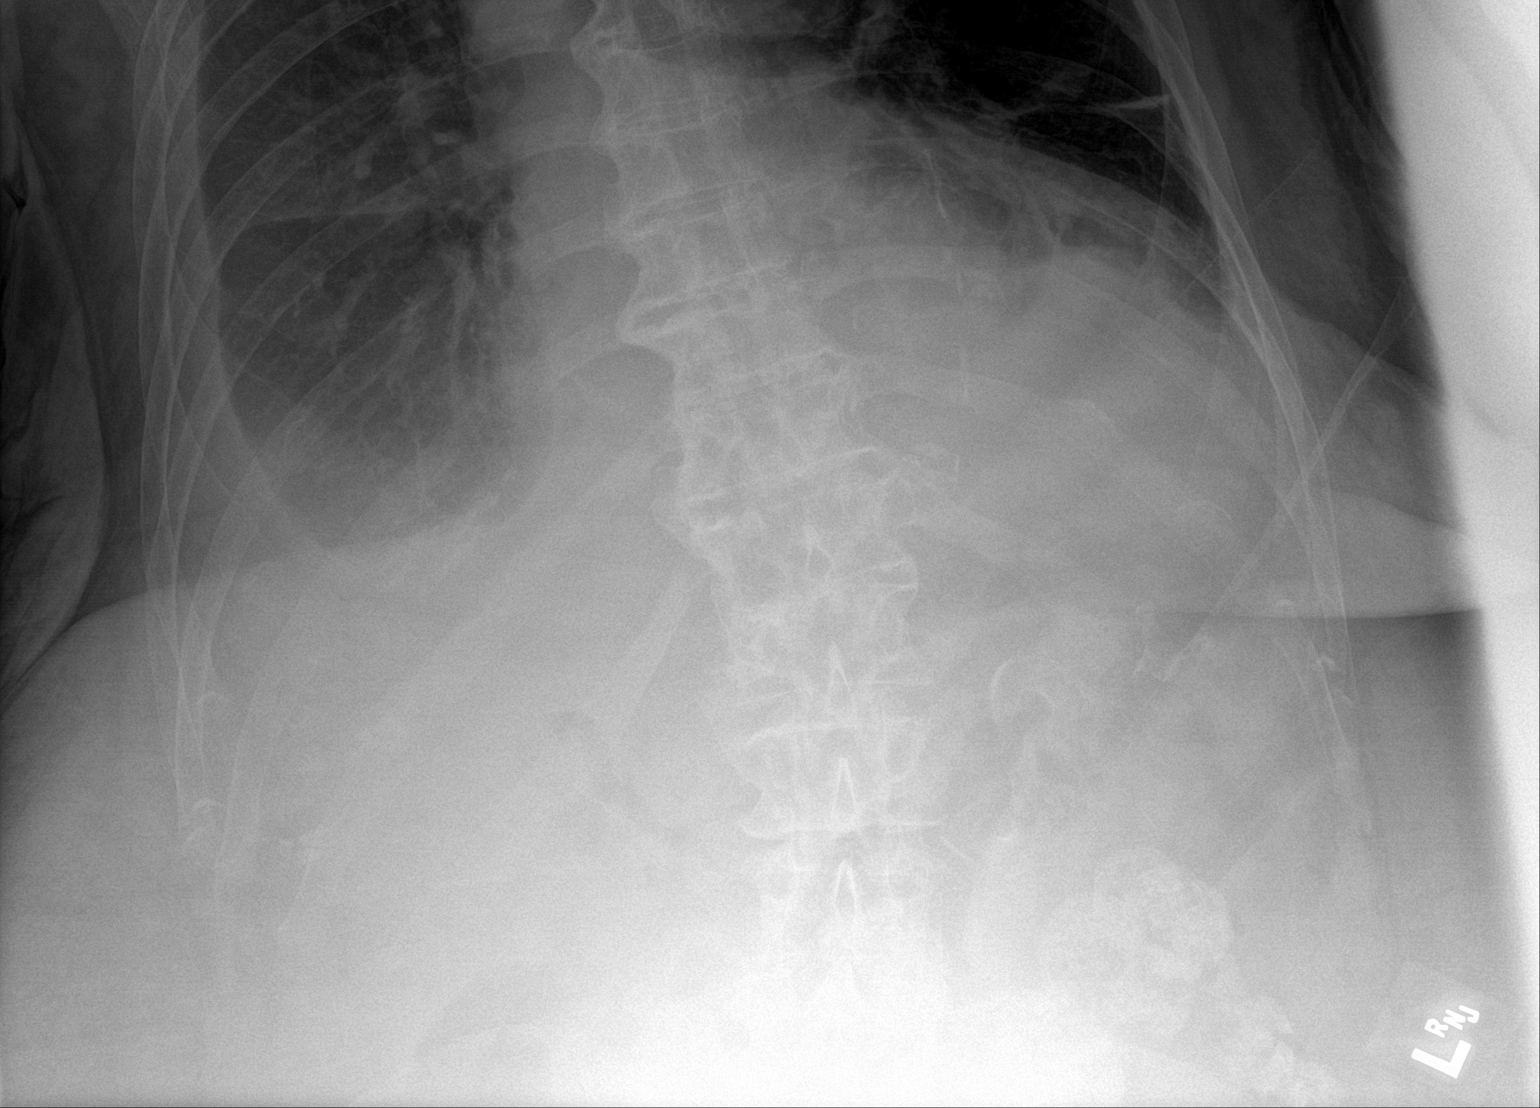

[2 of 2 positions shown; findings below may reference images not displayed]

FINDINGS: There is contrast in nondistended loops of large and small bowel. No
visible free air in the abdomen. Air is seen in the biliary tree.

Bilateral pleural effusions, right greater than left.

No acute bone abnormality. Severe arthritis of the right hip. Left
total hip prosthesis.
IMPRESSION: No acute abnormality of the abdomen. No evidence of bowel
obstruction. Air in the biliary tree as previously described on the
chest CT scan of 10/18/2017.
